# Patient Record
Sex: Male | Born: 1940
Health system: Southern US, Community
[De-identification: ages and names within clinical notes are randomized; demographics above are authoritative.]

## PROBLEM LIST (undated history)

## (undated) DIAGNOSIS — K5792 Diverticulitis of intestine, part unspecified, without perforation or abscess without bleeding: Secondary | ICD-10-CM

## (undated) DIAGNOSIS — C801 Malignant (primary) neoplasm, unspecified: Secondary | ICD-10-CM

## (undated) DIAGNOSIS — M1990 Unspecified osteoarthritis, unspecified site: Secondary | ICD-10-CM

## (undated) DIAGNOSIS — I10 Essential (primary) hypertension: Secondary | ICD-10-CM

## (undated) DIAGNOSIS — C459 Mesothelioma, unspecified: Secondary | ICD-10-CM

## (undated) DIAGNOSIS — R0600 Dyspnea, unspecified: Secondary | ICD-10-CM

## (undated) HISTORY — DX: Diverticulitis of intestine, part unspecified, without perforation or abscess without bleeding: K57.92

## (undated) HISTORY — PX: KNEE SURGERY: SHX244

## (undated) HISTORY — PX: APPENDECTOMY: SHX54

## (undated) HISTORY — PX: SPINE SURGERY: SHX786

## (undated) HISTORY — PX: PROSTATE SURGERY: SHX751

---

## 1963-02-16 HISTORY — PX: EYE SURGERY: SHX253

## 1996-02-16 HISTORY — PX: BACK SURGERY: SHX140

## 1997-07-02 ENCOUNTER — Ambulatory Visit (HOSPITAL_COMMUNITY): Admission: RE | Admit: 1997-07-02 | Discharge: 1997-07-02 | Payer: Self-pay | Admitting: Urology

## 2004-05-08 ENCOUNTER — Ambulatory Visit (HOSPITAL_COMMUNITY): Admission: RE | Admit: 2004-05-08 | Discharge: 2004-05-08 | Payer: Self-pay | Admitting: Specialist

## 2005-10-19 ENCOUNTER — Ambulatory Visit (HOSPITAL_COMMUNITY): Admission: RE | Admit: 2005-10-19 | Discharge: 2005-10-19 | Payer: Self-pay | Admitting: Specialist

## 2006-06-06 ENCOUNTER — Inpatient Hospital Stay (HOSPITAL_COMMUNITY): Admission: EM | Admit: 2006-06-06 | Discharge: 2006-06-07 | Payer: Self-pay | Admitting: Emergency Medicine

## 2006-06-07 ENCOUNTER — Encounter (INDEPENDENT_AMBULATORY_CARE_PROVIDER_SITE_OTHER): Payer: Self-pay | Admitting: Cardiology

## 2008-02-16 HISTORY — PX: JOINT REPLACEMENT: SHX530

## 2010-03-10 LAB — URINALYSIS, ROUTINE W REFLEX MICROSCOPIC
Specific Gravity, Urine: 1.017 (ref 1.005–1.030)
Urobilinogen, UA: 0.2 mg/dL (ref 0.0–1.0)

## 2010-03-10 LAB — COMPREHENSIVE METABOLIC PANEL
AST: 18 U/L (ref 0–37)
BUN: 11 mg/dL (ref 6–23)
Creatinine, Ser: 0.93 mg/dL (ref 0.4–1.5)
Glucose, Bld: 100 mg/dL — ABNORMAL HIGH (ref 70–99)
Potassium: 3.9 mEq/L (ref 3.5–5.1)
Sodium: 138 mEq/L (ref 135–145)

## 2010-03-10 LAB — CBC
Hemoglobin: 15.2 g/dL (ref 13.0–17.0)
MCH: 31.3 pg (ref 26.0–34.0)
MCV: 87.4 fL (ref 78.0–100.0)
Platelets: 210 10*3/uL (ref 150–400)
WBC: 8.4 10*3/uL (ref 4.0–10.5)

## 2010-03-10 LAB — DIFFERENTIAL
Basophils Absolute: 0.1 10*3/uL (ref 0.0–0.1)
Basophils Relative: 1 % (ref 0–1)
Eosinophils Absolute: 0.5 10*3/uL (ref 0.0–0.7)
Monocytes Absolute: 0.8 10*3/uL (ref 0.1–1.0)
Monocytes Relative: 10 % (ref 3–12)
Neutro Abs: 4.3 10*3/uL (ref 1.7–7.7)
Neutrophils Relative %: 51 % (ref 43–77)

## 2010-03-13 ENCOUNTER — Inpatient Hospital Stay (HOSPITAL_COMMUNITY)
Admission: RE | Admit: 2010-03-13 | Discharge: 2010-03-17 | Disposition: A | Discharge status: Home / Self Care | Payer: Medicare PPO | Source: Home / Self Care | Attending: Specialist | Admitting: Specialist

## 2010-03-13 LAB — ABO/RH: ABO/RH(D): O POS

## 2010-03-13 LAB — CROSSMATCH

## 2010-03-14 LAB — CBC
HCT: 38.8 % — ABNORMAL LOW (ref 39.0–52.0)
Hemoglobin: 13.4 g/dL (ref 13.0–17.0)
MCH: 30.7 pg (ref 26.0–34.0)
MCHC: 34.5 g/dL (ref 30.0–36.0)
MCV: 88.8 fL (ref 78.0–100.0)
Platelets: 200 10*3/uL (ref 150–400)
RBC: 4.37 MIL/uL (ref 4.22–5.81)
RDW: 12.2 % (ref 11.5–15.5)
WBC: 14.3 10*3/uL — ABNORMAL HIGH (ref 4.0–10.5)

## 2010-03-14 LAB — BASIC METABOLIC PANEL
GFR calc Af Amer: >60 mL/min (ref >60)
Potassium: 4.2 mEq/L (ref 3.5–5.1)
Sodium: 135 mEq/L (ref 135–145)

## 2010-03-15 LAB — CBC
HCT: 34.1 % — ABNORMAL LOW (ref 39.0–52.0)
Hemoglobin: 12.2 g/dL — ABNORMAL LOW (ref 13.0–17.0)
MCH: 31.4 pg (ref 26.0–34.0)
MCHC: 35.8 g/dL (ref 30.0–36.0)
MCV: 87.9 fL (ref 78.0–100.0)
Platelets: 190 K/uL (ref 150–400)
RBC: 3.88 MIL/uL — ABNORMAL LOW (ref 4.22–5.81)
RDW: 12.2 % (ref 11.5–15.5)
WBC: 13 K/uL — ABNORMAL HIGH (ref 4.0–10.5)

## 2010-03-15 LAB — BASIC METABOLIC PANEL WITH GFR
BUN: 10 mg/dL (ref 6–23)
CO2: 26 meq/L (ref 19–32)
Calcium: 8.3 mg/dL — ABNORMAL LOW (ref 8.4–10.5)
Chloride: 101 meq/L (ref 96–112)
Creatinine, Ser: 0.89 mg/dL (ref 0.4–1.5)
GFR calc non Af Amer: >60 mL/min
Glucose, Bld: 128 mg/dL — ABNORMAL HIGH (ref 70–99)
Potassium: 3.7 meq/L (ref 3.5–5.1)
Sodium: 133 meq/L — ABNORMAL LOW (ref 135–145)

## 2010-03-16 LAB — BASIC METABOLIC PANEL
CO2: 28 mEq/L (ref 19–32)
Calcium: 8.2 mg/dL — ABNORMAL LOW (ref 8.4–10.5)
Chloride: 99 mEq/L (ref 96–112)
Creatinine, Ser: 0.99 mg/dL (ref 0.4–1.5)
GFR calc non Af Amer: >60 mL/min (ref >60)
Glucose, Bld: 122 mg/dL — ABNORMAL HIGH (ref 70–99)
Potassium: 3.8 mEq/L (ref 3.5–5.1)
Sodium: 135 mEq/L (ref 135–145)

## 2010-03-16 LAB — CBC
Hemoglobin: 11.6 g/dL — ABNORMAL LOW (ref 13.0–17.0)
MCH: 30.9 pg (ref 26.0–34.0)
MCHC: 34.9 g/dL (ref 30.0–36.0)
RBC: 3.76 MIL/uL — ABNORMAL LOW (ref 4.22–5.81)
RDW: 12.2 % (ref 11.5–15.5)
WBC: 12.5 10*3/uL — ABNORMAL HIGH (ref 4.0–10.5)

## 2010-03-17 LAB — PROTIME-INR: INR: 1.11 (ref 0.00–1.49)

## 2010-04-15 NOTE — Discharge Summary (Signed)
Jack Haynes, Jack Haynes               ACCOUNT NO.:  [id number]  MEDICAL RECORD NO.:  [id number]          PATIENT TYPE:  INP  LOCATION:  1604                         FACILITY:  [facility]  PHYSICIAN:  Erasmo Leventhal, M.D.DATE OF BIRTH:  1940-04-03  DATE OF ADMISSION:  03/13/2010 DATE OF DISCHARGE:  03/17/2010                              DISCHARGE SUMMARY   ADMITTING DIAGNOSIS:  End-stage osteoarthritis, right knee.  DISCHARGE DIAGNOSIS:  End-stage osteoarthritis, right knee.  OPERATION:  Total knee arthroplasty, right knee.  BRIEF HISTORY:  This is a 70 year old gentleman with a history of end- stage osteoarthritis of the right knee that failed conservative treatment after discussion of treatment, benefits, risks and options. The patient is now scheduled for total knee arthroplasty of the right knee.  LABORATORY VALUES:  Admission CBC within normal exception of slightly high eosinophil of 6.  Admission PT and PTT within normal limits. Admission CMET showed glucose high at 100, otherwise normal. Hemoglobin, hematocrit reached a low of 11.6 and 33.2 on the 30th with WBC of 12.5, which was on a trend down.  He was slightly hyponatremic on the 29th at 1:33 and was back up to normal on the 30th.  He ran mildly elevated glucose throughout admission.  COURSE IN THE HOSPITAL:  The patient tolerated the procedure well. First postoperative day, his vital signs were stable.  He was afebrile. Neurovascular status was intact.  His drain was removed and he was started in therapy.  Second postoperative day, vital signs were stable. He was afebrile.  Dressing was changed.  Wound was benign.  Calves were negative and plans were made for possible discontinue on Monday.  Third postoperative day, he had significant pain and swelling in his right leg, especially the calf.  He was tender to palpation with an equivocal Homans sign.  Hemoglobin was 11.6, hematocrit 33.2, WBC 12.5.  PHYSICAL  EXAMINATION:  LUNGS:  Clear. HEART:  Sounds normal. ABDOMEN:  Bowel sounds active. EXTREMITIES:  Calf was swollen on the right, not swollen left with a equivocal Homan's.  Dressing was changed.  Wound was benign.  Dopplers were obtained, which showed no evidence of DVT.  Therefore on the fourth postoperative day, plans were made to have the patient discharged to [facility] for skilled nursing help as he lives alone and has no help at home.  CONDITION ON DISCHARGE:  Improved.  DISCHARGE MEDICATIONS: 1. Oxycodone 5 mg 1-2 q.4 h. p.r.n. pain. 2. Robaxin 500 1 p.o. q.8 h p.r.n. spasm. 3. Iron 325 mg one p.o. t.i.d. for anemia. 4. Lovenox 30 mg subcu q.12 h. for total of 10 days postop as DVT     prophylaxis.  DISCHARGE INSTRUCTIONS:  He is instructed to keep the wound clean and dry, to do his therapy daily, use CPM for 8 hours a day increasing by 5 degrees of flexion per day.  Again, keep the wound clean and dry for a total of 3 weeks postop and return to see Dr. Thomasena Edis in 2 weeks for recheck or sooner p.r.n. problems.     Jaquelyn Bitter. Chabon, P.A.   ______________________________ Erasmo Leventhal, M.D.  SJC/MEDQ  D:  03/16/2010  T:  03/16/2010  Job:  [id number]  Electronically Signed by Jodene Nam P.A. on 03/18/2010 12:43:36 PM Electronically Signed by Eugenia Mcalpine M.D. on 04/15/2010 05:19:23 PM

## 2010-04-15 NOTE — Op Note (Signed)
NAMEAUBURN, Jack Haynes               ACCOUNT NO.:  [id number]  MEDICAL RECORD NO.:  [id number]          PATIENT TYPE:  INP  LOCATION:  0007                         FACILITY:  [facility]  PHYSICIAN:  Erasmo Leventhal, M.D.DATE OF BIRTH:  May 07, 1940  DATE OF PROCEDURE:  03/13/2010 DATE OF DISCHARGE:                              OPERATIVE REPORT   PREOPERATIVE DIAGNOSIS:  Right knee end-stage osteoarthritis.  POSTOPERATIVE DIAGNOSIS:  Right knee end-stage osteoarthritis.  PROCEDURE:  Right total knee arthroplasty.  SURGEON:  Erasmo Leventhal, M.D.  ASSISTANT:  Jaquelyn Bitter. Chabon, P.A.  ANESTHESIA:  Spinal followed by monitored anesthesia care.  ESTIMATED BLOOD LOSS:  Less than 37 cc.  DRAINS:  One Hemovac.  COMPLICATIONS:  None.  TOURNIQUET TIME:  75 mmHg.  DISPOSITION:  PACU, stable.  OPERATIVE IMPLANT:  Laural Benes and [employer].  Size-4 femur, size- 4 tibia; 10-mm posterior stabilized rotating platform tibial insert, 38- mm patella, all cemented.  OPERATIVE DETAILS:  The patient was counseled in the holding area, correct site was identified.  IV was started.  Taken to the operating room where spinal anesthetic was administered, monitored anesthesia care.  Foley catheter was placed under sterile technique by OR circulating nurse.  All extremities were well padded and bumped.  Lower extremities were elevated, prepped with DuraPrep and draped in sterile fashion, exsanguinated with Esmarch, tourniquet was inflated to 300 mmHg.  Straight midline incision was made in skin and subcutaneous tissue.  Medial and lateral soft tissue flaps were developed.  Medial parapatellar arthrotomy was performed.  Proximal-medial soft tissue release was done and proximally was debrided.  Knee was flexed.  End- stage arthritis changes, bone against bone.  Cruciate ligaments were resected.  Starter hole made in the distal femur.  Canal was irrigated until the effluent was clear.  An  intramedullary rod was placed which showed a 5-degree valgus cut and a 10-mm cut off distal femur.  Distal femur was found to be a size-4, rotation markers were set, cut and set for a size-4.  A cutting block was applied.  Medial menisci removed under direct visualization.  Genicular vessels were coagulated. Posterior neurovascular structures were thawed off and protected.  Tibia subluxed anteriorly utilizing an extramedullary alignment of tibia.  We chose a 0-degrees slope and took a 2-mm cup distant defect on medial side.  Flexion and extension blocks with a 10-insert with well-balanced. Posteromedial and posterior femoral osteophytes were removed under direct visualization.  Tibia was subluxed anteriorly, found be a size-4 rotation cover set reamer punch performed and femoral box cut at this time 4 tibia, 4 femur, 10 insert well-balanced flexion extension gaps, varus-valgus stress at 0 to 36, 90 degrees of flexion.  Patella found be a size 38.  Appropriate amount of bone was resected through the large osteophyte, lock holes made for 35 button tracked anatomically.  All trials were removed.  Knee was irrigated with pulsatile lavage utilizing modern cement technique.  All components were cemented in place.  The size-4 tibia, size-4 femur, 38-patella, 10 trial.  After all cemented cured.  Excess cement was removed, 10 trial was found be excellent  balance and tracking in alignment.  Trial was removed, excess cement was removed.  Again, the knee was irrigated.  Gelfoam placed posteriorly and a final 2-mm posterior stabilizing rotating platform tibial insert was implanted and had well-aligned balanced and tracking knee.  A medium Hemovac drain was placed.  The arthrotomy was closed with 90 degrees of flexion, Vicryl subcu, skin with subcuticular Monocryl suture.  Steri- Strips applied.  Drain hooked to suction.  Sterile dressing applied. Tourniquet deflated.  Normal circulation in foot at end  of the case.  No complications or problems.  Sponge and needle count were correct.  For help with surgical technique and decision making, [name], PA assistance was needed throughout entire case.          ______________________________ Erasmo Leventhal, M.D.     RAC/MEDQ  D:  03/13/2010  T:  03/13/2010  Job:  [id number]  Electronically Signed by Eugenia Mcalpine M.D. on 04/15/2010 05:19:26 PM

## 2010-07-03 NOTE — Discharge Summary (Signed)
NAMEXYLON, CROOM NO.:  [id number]   MEDICAL RECORD NO.:  [id number]          PATIENT TYPE:  INP   LOCATION:  6523                         FACILITY:  [facility]   PHYSICIAN:  Nanetta Batty, M.D.   DATE OF BIRTH:  05-May-1940   DATE OF ADMISSION:  06/06/2006  DATE OF DISCHARGE:  06/07/2006                               DISCHARGE SUMMARY   HISTORY OF PRESENT ILLNESS:  Mr. Iddings is a 70 year old white married  male patient who was taken to the emergency room with complaints of  abdominal pain radiating up to his chest and left arm.  He was seen  originally at urgent care and was sent to [facility] ER.  He was also  apparently unable to urinate when he started having the pain.  He went  to work.  However, then he experienced weakness, diaphoresis, and  nausea, and mid-chest discomfort, and abdominal pain.  In the ER, a  Foley catheter was placed.  I do not see any reporting of any amount of  urine after the Foley was placed.  He was admitted.  A renal ultrasound  was ordered.  It was negative.  A PSA was ordered.  It was 0.1.  A  cardiac panel was negative x3.  He did have an abnormal EKG with  nonspecific T changes and a Q in III.  The following day on June 07, 2006, his Foley was discontinued.  He was able to urinate.  He had a  somewhat lower pelvic area soreness, but otherwise he was feeling fine.  A 2-D echocardiogram was done.  It was pending at the time of this  dictation.  He was seen by Dr. Allyson Sabal, and he was deemed for discharge  home.  He was sent home on an aspirin a day.   FOLLOWUP:  He will have an outpatient Cardiolite in our office this  week, and he may follow up with Dr. Tresa Endo.   OTHER LABORATORY DATA:  Sodium was 138, potassium 4.4, BUN 18,  creatinine 1.12, glucose of 153, hemoglobin 15.6, hematocrit 45.8,  platelets 231, and WBC is 14.2.   DISCHARGE DIAGNOSES:  1. Chest discomfort.  2. Dysuria.  3. History of prostatectomy secondary to  prostate cancer.      Lezlie Octave, N.P.      Nanetta Batty, M.D.  Electronically Signed    BB/MEDQ  D:  06/07/2006  T:  06/07/2006  Job:  [id number]   cc:   Chapman Fitch, MD

## 2011-11-01 ENCOUNTER — Ambulatory Visit (INDEPENDENT_AMBULATORY_CARE_PROVIDER_SITE_OTHER): Payer: Medicare PPO | Admitting: Internal Medicine

## 2011-11-01 ENCOUNTER — Encounter: Payer: Self-pay | Admitting: Internal Medicine

## 2011-11-01 VITALS — BP 145/88 | HR 68 | Temp 97.9°F | Resp 16 | Ht 68.0 in | Wt 189.0 lb

## 2011-11-01 DIAGNOSIS — Z0000 Encounter for general adult medical examination without abnormal findings: Secondary | ICD-10-CM

## 2011-11-01 DIAGNOSIS — C61 Malignant neoplasm of prostate: Secondary | ICD-10-CM | POA: Insufficient documentation

## 2011-11-01 DIAGNOSIS — H9190 Unspecified hearing loss, unspecified ear: Secondary | ICD-10-CM

## 2011-11-01 DIAGNOSIS — M159 Polyosteoarthritis, unspecified: Secondary | ICD-10-CM

## 2011-11-01 DIAGNOSIS — Z23 Encounter for immunization: Secondary | ICD-10-CM

## 2011-11-01 DIAGNOSIS — H902 Conductive hearing loss, unspecified: Secondary | ICD-10-CM

## 2011-11-01 LAB — LIPID PANEL
LDL Cholesterol: 137 mg/dL — ABNORMAL HIGH (ref 0–99)
Triglycerides: 135 mg/dL (ref <150)
VLDL: 27 mg/dL (ref 0–40)

## 2011-11-01 LAB — COMPREHENSIVE METABOLIC PANEL
BUN: 14 mg/dL (ref 6–23)
Chloride: 105 mEq/L (ref 96–112)
Creat: 0.91 mg/dL (ref 0.50–1.35)
Sodium: 139 mEq/L (ref 135–145)
Total Bilirubin: 0.6 mg/dL (ref 0.3–1.2)

## 2011-11-01 LAB — POCT UA - MICROSCOPIC ONLY
Bacteria, U Microscopic: NEGATIVE
Casts, Ur, LPF, POC: NEGATIVE
Yeast, UA: NEGATIVE

## 2011-11-01 LAB — CBC WITH DIFFERENTIAL/PLATELET
Basophils Absolute: 0 10*3/uL (ref 0.0–0.1)
Basophils Relative: 1 % (ref 0–1)
Eosinophils Absolute: 0.5 10*3/uL (ref 0.0–0.7)
HCT: 46.5 % (ref 39.0–52.0)
Lymphs Abs: 2.4 10*3/uL (ref 0.7–4.0)
MCHC: 35.5 g/dL (ref 30.0–36.0)
Monocytes Relative: 10 % (ref 3–12)
Platelets: 233 10*3/uL (ref 150–400)
RBC: 5.3 MIL/uL (ref 4.22–5.81)

## 2011-11-01 LAB — POCT URINALYSIS DIPSTICK
Blood, UA: NEGATIVE
Spec Grav, UA: >=1.03

## 2011-11-01 NOTE — Progress Notes (Signed)
  Subjective:    Patient ID: Jack Haynes, male    DOB: [birth year], 71 y.o.   MRN: [id number]  HPI Doing well. Having noticeable hearing loss. See scanned hx No spells Osteoarthritis all over, one knee replacement   Review of Systems See scanned ros    Objective:   Physical Exam  Constitutional: He is oriented to person, place, and time. He appears well-developed and well-nourished.  HENT:  Right Ear: External ear normal.  Left Ear: External ear normal.  Nose: Nose normal.  Mouth/Throat: Oropharynx is clear and moist.  Eyes: EOM are normal. Pupils are equal, round, and reactive to light.  Neck: No tracheal deviation present. No thyromegaly present.  Cardiovascular: Normal rate, regular rhythm and normal heart sounds.   Pulmonary/Chest: Effort normal and breath sounds normal.  Abdominal: Soft. Bowel sounds are normal. He exhibits no mass. There is no tenderness.  Genitourinary: Rectum normal, prostate normal and penis normal.  Musculoskeletal: He exhibits edema and tenderness.  Lymphadenopathy:    He has no cervical adenopathy.  Neurological: He is alert and oriented to person, place, and time. He has normal reflexes. No cranial nerve deficit. He exhibits normal muscle tone. Coordination normal.  Skin: Skin is warm and dry.  Psychiatric: He has a normal mood and affect. His behavior is normal. Judgment and thought content normal.   ekg nl pft nl audiometry      Assessment & Plan:  Audiogram Yearly cpe

## 2011-11-01 NOTE — Patient Instructions (Addendum)
Osteoarthritis Osteoarthritis is the most common form of arthritis. It is redness, soreness, and swelling (inflammation) affecting the cartilage. Cartilage acts as a cushion, covering the ends of bones where they meet to form a joint. CAUSES  Over time, the cartilage begins to wear away. This causes bone to rub on bone. This produces pain and stiffness in the affected joints. Factors that contribute to this problem are:  Excessive body weight.   Age.   Overuse of joints.  SYMPTOMS   People with osteoarthritis usually experience joint pain, swelling, or stiffness.   Over time, the joint may lose its normal shape.   Small deposits of bone (osteophytes) may grow on the edges of the joint.   Bits of bone or cartilage can break off and float inside the joint space. This may cause more pain and damage.   Osteoarthritis can lead to depression, anxiety, feelings of helplessness, and limitations on daily activities.  The most commonly affected joints are in the:  Ends of the fingers.   Thumbs.   Neck.   Lower back.   Knees.   Hips.  DIAGNOSIS  Diagnosis is mostly based on your symptoms and exam. Tests may be helpful, including:  X-rays of the affected joint.   A computerized magnetic scan (MRI).   Blood tests to rule out other types of arthritis.   Joint fluid tests. This involves using a needle to draw fluid from the joint and examining the fluid under a microscope.  TREATMENT  Goals of treatment are to control pain, improve joint function, maintain a normal body weight, and maintain a healthy lifestyle. Treatment approaches may include:  A prescribed exercise program with rest and joint relief.   Weight control with nutritional education.   Pain relief techniques such as:   Properly applied heat and cold.   Electric pulses delivered to nerve endings under the skin (transcutaneous electrical nerve stimulation, TENS).   Massage.   Certain supplements. Ask your  caregiver before using any supplements, especially in combination with prescribed drugs.   Medicines to control pain, such as:   Acetaminophen.   Nonsteroidal anti-inflammatory drugs (NSAIDs), such as naproxen.   Narcotic or central-acting agents, such as tramadol. This drug carries a risk of addiction and is generally prescribed for short-term use.   Corticosteroids. These can be given orally or as injection. This is a short-term treatment, not recommended for routine use.   Surgery to reposition the bones and relieve pain (osteotomy) or to remove loose pieces of bone and cartilage. Joint replacement may be needed in advanced states of osteoarthritis.  HOME CARE INSTRUCTIONS  Your caregiver can recommend specific types of exercise. These may include:  Strengthening exercises. These are done to strengthen the muscles that support joints affected by arthritis. They can be performed with weights or with exercise bands to add resistance.   Aerobic activities. These are exercises, such as brisk walking or low-impact aerobics, that get your heart pumping. They can help keep your lungs and circulatory system in shape.   Range-of-motion activities. These keep your joints limber.   Balance and agility exercises. These help you maintain daily living skills.  Learning about your condition and being actively involved in your care will help improve the course of your osteoarthritis. SEEK MEDICAL CARE IF:   You feel hot or your skin turns red.   You develop a rash in addition to your joint pain.   You have an oral temperature above 102 F (38.9 C).  FOR   MORE INFORMATION  National Institute of Arthritis and Musculoskeletal and Skin Diseases: www.niams.nih.gov National Institute on Aging: www.nia.nih.gov American College of Rheumatology: www.rheumatology.org Document Released: 02/01/2005 Document Revised: 01/21/2011 Document Reviewed: 05/15/2009 Exit[employer] Patient Information 2012 [employer]  [employer]. 

## 2011-11-02 ENCOUNTER — Encounter: Payer: Self-pay | Admitting: Internal Medicine

## 2011-11-02 LAB — PULMONARY FUNCTION TEST

## 2011-11-29 ENCOUNTER — Encounter: Payer: Self-pay | Admitting: Internal Medicine

## 2011-12-26 ENCOUNTER — Emergency Department (HOSPITAL_COMMUNITY)
Admission: EM | Admit: 2011-12-26 | Discharge: 2011-12-26 | Disposition: A | Discharge status: Home / Self Care | Payer: Medicare PPO | Attending: Emergency Medicine | Admitting: Emergency Medicine

## 2011-12-26 ENCOUNTER — Encounter (HOSPITAL_COMMUNITY): Payer: Self-pay | Admitting: Emergency Medicine

## 2011-12-26 ENCOUNTER — Emergency Department (HOSPITAL_COMMUNITY): Payer: Medicare PPO

## 2011-12-26 DIAGNOSIS — Z79899 Other long term (current) drug therapy: Secondary | ICD-10-CM | POA: Insufficient documentation

## 2011-12-26 DIAGNOSIS — M25562 Pain in left knee: Secondary | ICD-10-CM

## 2011-12-26 DIAGNOSIS — M25462 Effusion, left knee: Secondary | ICD-10-CM

## 2011-12-26 DIAGNOSIS — M25469 Effusion, unspecified knee: Secondary | ICD-10-CM | POA: Insufficient documentation

## 2011-12-26 MED ORDER — MELOXICAM 15 MG PO TABS: 15.0000 mg | ORAL_TABLET | Freq: Every day | ORAL | Status: DC

## 2011-12-26 MED ORDER — BENZOCAINE (TOPICAL) 20 % EX AERO: INHALATION_SPRAY | Freq: Four times a day (QID) | CUTANEOUS | Status: DC | PRN

## 2011-12-26 MED ORDER — TRIAMCINOLONE ACETONIDE 40 MG/ML IJ SUSP
40.0000 mg | Freq: Once | INTRAMUSCULAR | Status: AC
  Administered 2011-12-26: 40 mg via INTRAMUSCULAR
  Filled 2011-12-26: qty 1

## 2011-12-26 NOTE — ED Notes (Signed)
Pt states he bent over yesterday to clean up leaves and heard cracking/pop sound from L knee.

## 2011-12-26 NOTE — ED Provider Notes (Signed)
History     CSN: [id number]  Arrival date & time 12/26/11  4098   First MD Initiated Contact with Patient 12/26/11 1009      Chief Complaint  Patient presents with  . Knee Pain    (Consider location/radiation/quality/duration/timing/severity/associated sxs/prior treatment) HPI Comments: Jack Haynes 71 y.o. male   The chief complaint is: Patient presents with:   Knee Pain    CC of left knee pain and swelling. History of OA left knee and plan for future plans for LTK replacement. Followed by Dr. Valma Cava at [facility] ortho. Yesterday sttood from squating postiion and heard la loud pop.  Immediate swelling and pain. Unable to bear weight. Previous injury to knee of patellar dislocation approx.  50 years ago.  Multiple surgeries for cartilage repair and two arthroscopies.  Deneis twisting injury. Denies feeling of instability.  ROM decreased due to pain and sweilling. Denies numbness tingling in the foot. Patient states that knee aspiration has helped when he has had this before.   Denies fevers, chills, myalgias, arthralgias. Denies DOE, SOB, chest tightness or pressure, radiation to left arm, jaw or back, or diaphoresis. Denies dysuria, flank pain, suprapubic pain, frequency, urgency, or hematuria. Denies headaches, light headedness, weakness, visual disturbances. Denies abdominal pain, nausea, vomiting, diarrhea or constipation. .    Patient is a 71 y.o. male presenting with knee pain. The history is provided by the patient. No language interpreter was used.  Knee Pain    History reviewed. No pertinent past medical history.  Past Surgical History  Procedure Date  . Knee surgery     No family history on file.  History  Substance Use Topics  . Smoking status: Never Smoker   . Smokeless tobacco: Current User  . Alcohol Use: Yes      Review of Systems  All other systems reviewed and are negative.   Review of Systems  All other systems reviewed and are  negative.    Review of Systems  Constitutional: Negative.  Negative for fever and chills.  HENT: Negative.   Eyes: Negative.   Respiratory: Negative.  Negative for cough and shortness of breath.   Cardiovascular: Negative.  Negative for chest pain and palpitations.  Gastrointestinal: Negative.  Negative for vomiting, abdominal pain, diarrhea and constipation.  Genitourinary: Negative.  Negative for dysuria, urgency and frequency.  Musculoskeletal: Positive for Left knee welling and pain.  Decreased ROM. Inability to bear weight. Skin: Negative for rash and wound.  Neurological: Negative.  Negative for headaches.  Psychiatric/Behavioral: Negative.   All other systems reviewed and are negative.    Allergies  Review of patient's allergies indicates no known allergies.  Home Medications   Current Outpatient Rx  Name  Route  Sig  Dispense  Refill  . NAPROXEN SODIUM 220 MG PO TABS   Oral   Take 220 mg by mouth 2 (two) times a week.           BP 165/88  Pulse 86  Temp 97.1 F (36.2 C) (Oral)  Resp 22  SpO2 96%  Physical Exam Physical Exam  Nursing note and vitals reviewed. Constitutional: He appears well-developed and well-nourished. No distress.  HENT:  Head: Normocephalic and atraumatic.  Eyes: Conjunctivae normal are normal. No scleral icterus.  Neck: Normal range of motion. Neck supple.  Cardiovascular: Normal rate, regular rhythm and normal heart sounds.   Pulmonary/Chest: Effort normal and breath sounds normal. No respiratory distress.  Abdominal: Soft. There is no tenderness.  Musculoskeletal: A  left knee exam was performed. SKIN: intact SWELLING: significant EFFUSION: tense WARMTH: no warmth TENDERNESS: diffuse and severe ROM: limited by pain NEUROLOGICAL EXAM: normal VASCULAR EXAM: normal  Neurological: He is alert.  Skin: Skin is warm and dry. He is not diaphoretic.  Psychiatric: His behavior is normal.     ED Course  Procedures (including  critical care time)  Labs Reviewed - No data to display Dg Knee Complete 4 Views Left  12/26/2011  *RADIOLOGY REPORT*  Clinical Data: The patient felt a tearing sensation this morning when standing up.  LEFT KNEE - COMPLETE 4+ VIEW  Comparison: None.  Findings: There is significant tried compartmental degenerative change, with near complete obliteration of the lateral joint compartment.  Moderate joint effusion is present.  There is no evidence for acute fracture or dislocation.  IMPRESSION:  1.  Significant degenerative change. 2.  Moderate joint effusion.   Original Report Authenticated By: Norva Pavlov, M.D.      1. Knee pain, left   2. Effusion of left knee       MDM  Patient with Knee effusion and swelling . Patella feel displaced.  Awaiting xray.  Patien has RX hydrocodone at home for his regular OA.  He rarely uses it and takes 1 aleve twice a week for OA.  10:45 AM BP 165/88  Pulse 86  Temp 97.1 F (36.2 C) (Oral)  Resp 22  SpO2 96% Patient with Knee effusion, OA and no signs of acute fracture or dislocation.   .Patient seen in shared visit with Thayer Ohm lawyer PA-C who Perormed Joint aspiration. Please see his note.  WIll apply knee immoblizer. Patient has crutches.  No signs of infection.  Will d/c with mobic discontinue aleve.  vicodin as needed.  F/U Dr. Thomasena Edis Discussed reasons to seek immediate care. Patient expresses understanding and agrees with plan.        Arthor Captain, PA-C 12/26/11 1158

## 2011-12-26 NOTE — ED Provider Notes (Signed)
Medical screening examination/treatment/procedure(s) were performed by non-physician practitioner and as supervising physician I was immediately available for consultation/collaboration.   Kianni Lheureux, MD 12/26/11 1601 

## 2011-12-30 ENCOUNTER — Telehealth: Payer: Self-pay

## 2011-12-30 NOTE — Telephone Encounter (Signed)
Pt would like [place] to call him at [phone] when form is complete.

## 2011-12-30 NOTE — Telephone Encounter (Signed)
Pt dropped off a form for surgical clearance for [facility]. I have put it in Dr Ernestene Mention box for completion if appropriate.

## 2012-01-10 ENCOUNTER — Other Ambulatory Visit: Payer: Self-pay | Admitting: Pain Medicine

## 2012-01-26 ENCOUNTER — Encounter (HOSPITAL_COMMUNITY): Payer: Self-pay | Admitting: Pharmacy Technician

## 2012-01-26 ENCOUNTER — Other Ambulatory Visit: Payer: Self-pay | Admitting: Pain Medicine

## 2012-01-26 NOTE — Patient Instructions (Signed)
20 Jack Haynes  01/26/2012   Your procedure is scheduled on: 02/01/12    Report to [facility] at 1230 AM.  Call this number if you have problems the morning of surgery: [phone]   Remember:   Do not eat food:After Midnight.  May have clear liquids:until 0830am then npo .  Clear liquids include soda, tea, black coffee, apple or grape juice, broth.  Take these medicines the morning of surgery with A SIP OF WATER:    Do not wear jewelry,   Do not wear lotions, powders, or perfumes..  . Men may shave face and neck.  Do not bring valuables to the hospital.  Contacts, dentures or bridgework may not be worn into surgery.  Leave suitcase in the car. After surgery it may be brought to your room.  For patients admitted to the hospital, checkout time is 11:00 AM the day of discharge.              SEE CHG INSTRUCTION SHEET    Please read over the following fact sheets that you were given: MRSA Information, coughing and deep breathing exercises, leg exercises, Blood Transfusion Fact Sheet, Incentive Spirometry Fact Sheet               Failure to comply with these instructions may result in cancellation of your surgery.               Patient Signature _______________________________________________________              Nurse Signature _______________________________________________________

## 2012-01-26 NOTE — H&P (Signed)
TOTAL KNEE ADMISSION H&P  Patient is being admitted for left total knee arthroplasty.  Subjective:  Chief Complaint:left knee pain.  HPI: Jack Haynes, 71 y.o. male, has a history of pain and functional disability in the left knee due to arthritis and has failed non-surgical conservative treatments for greater than 12 weeks to includeNSAID's and/or analgesics, corticosteriod injections, flexibility and strengthening excercises, supervised PT with diminished ADL's post treatment and activity modification.  Onset of symptoms was abrupt, starting 8 years ago with rapidlly worsening course since that time. The patient noted prior procedures on the knee to include  arthroscopy on the left knee(s).  Patient currently rates pain in the left knee(s) at 8 out of 10 with activity. Patient has night pain, worsening of pain with activity and weight bearing, pain that interferes with activities of daily living, pain with passive range of motion and crepitus.  Patient has evidence of subchondral cysts, sclerotic changes,osteophytes,bone on bone lateral joint by imaging studies. This patient has had arthroscopy. There is no active infection.  Patient Active Problem List   Diagnosis Date Noted  . Prostate cancer 11/01/2011   No past medical history on file.  Past Surgical History  Procedure Date  . Knee surgery      (Not in a hospital admission) No Known Allergies  History  Substance Use Topics  . Smoking status: Never Smoker   . Smokeless tobacco: Current User  . Alcohol Use: Yes    No family history on file.   Review of Systems  All other systems reviewed and are negative.    Objective:  Physical Exam Cons alert well developed,amb with limp,head/neck WNL, no lymphadenopathy,lungs clear, heart RRR, Abd soft NT +BS, Up extrem WNL, Right leg good ROM hip,knee with midline incision good ROM,leg NMVI, Left leg good ROM hip,knee valgus deform, 5-110 ROM, leg NMVI, Breast,rectal  Defurred.  Vital signs in last 24 hours: @VSRANGES @  Labs:   Estimated Body mass index is 28.74 kg/(m^2) as calculated from the following:   Height as of 11/01/11: 5\' 8" (1.727 m).   Weight as of 11/01/11: 189 lb(85.73 kg).   Imaging Review Plain radiographs demonstrate severe degenerative joint disease of the left knee(s). The overall alignment ismild valgus. The bone quality appears to be good for age and reported activity level.  Assessment/Plan:  End stage arthritis, left knee   The patient history, physical examination, clinical judgment of the provider and imaging studies are consistent with end stage degenerative joint disease of the left knee(s) and total knee arthroplasty is deemed medically necessary. The treatment options including medical management, injection therapy arthroscopy and arthroplasty were discussed at length. The risks and benefits of total knee arthroplasty were presented and reviewed. The risks due to aseptic loosening, infection, stiffness, patella tracking problems, thromboembolic complications and other imponderables were discussed. The patient acknowledged the explanation, agreed to proceed with the plan and consent was signed. Patient is being admitted for inpatient treatment for surgery, pain control, PT, OT, prophylactic antibiotics, VTE prophylaxis, progressive ambulation and ADL's and discharge planning. The patient is planning to be discharged home with home health services

## 2012-01-27 ENCOUNTER — Encounter (HOSPITAL_COMMUNITY)
Admission: RE | Admit: 2012-01-27 | Discharge: 2012-01-27 | Disposition: A | Discharge status: Home / Self Care | Payer: Medicare PPO | Source: Ambulatory Visit | Attending: Specialist | Admitting: Specialist

## 2012-01-27 ENCOUNTER — Encounter (HOSPITAL_COMMUNITY): Payer: Self-pay

## 2012-01-27 HISTORY — DX: Unspecified osteoarthritis, unspecified site: M19.90

## 2012-01-27 HISTORY — DX: Malignant (primary) neoplasm, unspecified: C80.1

## 2012-01-27 LAB — CBC WITH DIFFERENTIAL/PLATELET
Eosinophils Relative: 5 % (ref 0–5)
HCT: 44.1 % (ref 39.0–52.0)
Lymphocytes Relative: 29 % (ref 12–46)
Lymphs Abs: 2.1 10*3/uL (ref 0.7–4.0)
MCV: 86.5 fL (ref 78.0–100.0)
Monocytes Absolute: 0.8 10*3/uL (ref 0.1–1.0)
RBC: 5.1 MIL/uL (ref 4.22–5.81)
WBC: 7.3 10*3/uL (ref 4.0–10.5)

## 2012-01-27 LAB — URINALYSIS, ROUTINE W REFLEX MICROSCOPIC
Glucose, UA: NEGATIVE mg/dL
Ketones, ur: NEGATIVE mg/dL
Leukocytes, UA: NEGATIVE
Nitrite: NEGATIVE
Protein, ur: NEGATIVE mg/dL

## 2012-01-27 LAB — PROTIME-INR: INR: 0.97 (ref 0.00–1.49)

## 2012-01-27 LAB — COMPREHENSIVE METABOLIC PANEL
CO2: 24 mEq/L (ref 19–32)
Calcium: 9 mg/dL (ref 8.4–10.5)
Creatinine, Ser: 0.81 mg/dL (ref 0.50–1.35)
GFR calc Af Amer: >90 mL/min (ref >90)
GFR calc non Af Amer: 87 mL/min — ABNORMAL LOW (ref >90)
Glucose, Bld: 89 mg/dL (ref 70–99)

## 2012-01-31 MED ORDER — CEFAZOLIN SODIUM-DEXTROSE 2-3 GM-% IV SOLR
2.0000 g | INTRAVENOUS | Status: AC
  Administered 2012-02-01: 2 g via INTRAVENOUS

## 2012-02-01 ENCOUNTER — Inpatient Hospital Stay (HOSPITAL_COMMUNITY): Payer: Medicare PPO | Admitting: Anesthesiology

## 2012-02-01 ENCOUNTER — Encounter (HOSPITAL_COMMUNITY)
Admission: RE | Disposition: A | Discharge status: Home Health | Payer: Self-pay | Source: Ambulatory Visit | Attending: Specialist

## 2012-02-01 ENCOUNTER — Encounter (HOSPITAL_COMMUNITY): Payer: Self-pay | Admitting: Anesthesiology

## 2012-02-01 ENCOUNTER — Encounter (HOSPITAL_COMMUNITY): Payer: Self-pay | Admitting: *Deleted

## 2012-02-01 ENCOUNTER — Inpatient Hospital Stay (HOSPITAL_COMMUNITY)
Admission: RE | Admit: 2012-02-01 | Discharge: 2012-02-04 | DRG: 470 | Disposition: A | Discharge status: Home Health | Payer: Medicare PPO | Source: Ambulatory Visit | Attending: Specialist | Admitting: Specialist

## 2012-02-01 DIAGNOSIS — Z01812 Encounter for preprocedural laboratory examination: Secondary | ICD-10-CM

## 2012-02-01 DIAGNOSIS — D62 Acute posthemorrhagic anemia: Secondary | ICD-10-CM | POA: Diagnosis not present

## 2012-02-01 DIAGNOSIS — Z96659 Presence of unspecified artificial knee joint: Secondary | ICD-10-CM

## 2012-02-01 DIAGNOSIS — M1710 Unilateral primary osteoarthritis, unspecified knee: Principal | ICD-10-CM | POA: Diagnosis present

## 2012-02-01 DIAGNOSIS — I10 Essential (primary) hypertension: Secondary | ICD-10-CM | POA: Diagnosis present

## 2012-02-01 HISTORY — PX: TOTAL KNEE ARTHROPLASTY: SHX125

## 2012-02-01 LAB — TYPE AND SCREEN: Antibody Screen: NEGATIVE

## 2012-02-01 SURGERY — ARTHROPLASTY, KNEE, TOTAL
Anesthesia: Spinal | Site: Knee | Laterality: Left | Wound class: Clean

## 2012-02-01 MED ORDER — PHENOL 1.4 % MT LIQD
1.0000 | OROMUCOSAL | Status: DC | PRN
  Filled 2012-02-01: qty 177

## 2012-02-01 MED ORDER — BUPIVACAINE IN DEXTROSE 0.75-8.25 % IT SOLN
INTRATHECAL | Status: DC | PRN
  Administered 2012-02-01: 1.8 mL via INTRATHECAL

## 2012-02-01 MED ORDER — ALUM & MAG HYDROXIDE-SIMETH 200-200-20 MG/5ML PO SUSP: 30.0000 mL | ORAL | Status: DC | PRN

## 2012-02-01 MED ORDER — CEFAZOLIN SODIUM-DEXTROSE 2-3 GM-% IV SOLR
2.0000 g | Freq: Four times a day (QID) | INTRAVENOUS | Status: AC
  Administered 2012-02-01 – 2012-02-02 (×2): 2 g via INTRAVENOUS
  Filled 2012-02-01 (×2): qty 50

## 2012-02-01 MED ORDER — ONDANSETRON HCL 4 MG/2ML IJ SOLN: 4.0000 mg | Freq: Four times a day (QID) | INTRAMUSCULAR | Status: DC | PRN

## 2012-02-01 MED ORDER — ONDANSETRON HCL 4 MG PO TABS: 4.0000 mg | ORAL_TABLET | Freq: Four times a day (QID) | ORAL | Status: DC | PRN

## 2012-02-01 MED ORDER — SODIUM CHLORIDE 0.9 % IV SOLN: INTRAVENOUS | Status: DC

## 2012-02-01 MED ORDER — KETOROLAC TROMETHAMINE 30 MG/ML IJ SOLN
INTRAMUSCULAR | Status: DC | PRN
  Administered 2012-02-01: 30 mg via INTRAMUSCULAR

## 2012-02-01 MED ORDER — LACTATED RINGERS IV SOLN
INTRAVENOUS | Status: DC
  Administered 2012-02-01: 1000 mL via INTRAVENOUS
  Administered 2012-02-01 (×2): via INTRAVENOUS

## 2012-02-01 MED ORDER — OXYCODONE HCL 5 MG/5ML PO SOLN
5.0000 mg | Freq: Once | ORAL | Status: DC | PRN
  Filled 2012-02-01: qty 5

## 2012-02-01 MED ORDER — FLEET ENEMA 7-19 GM/118ML RE ENEM: 1.0000 | ENEMA | Freq: Once | RECTAL | Status: AC | PRN

## 2012-02-01 MED ORDER — HYDROMORPHONE HCL PF 1 MG/ML IJ SOLN: 0.2500 mg | INTRAMUSCULAR | Status: DC | PRN

## 2012-02-01 MED ORDER — PROPOFOL 10 MG/ML IV BOLUS
INTRAVENOUS | Status: DC | PRN
  Administered 2012-02-01 (×2): 10 mg via INTRAVENOUS

## 2012-02-01 MED ORDER — BUPIVACAINE-EPINEPHRINE PF 0.25-1:200000 % IJ SOLN
INTRAMUSCULAR | Status: DC | PRN
  Administered 2012-02-01: 50 mL

## 2012-02-01 MED ORDER — PROPOFOL 10 MG/ML IV EMUL
INTRAVENOUS | Status: DC | PRN
  Administered 2012-02-01: 100 ug/kg/min via INTRAVENOUS

## 2012-02-01 MED ORDER — ACETAMINOPHEN 10 MG/ML IV SOLN: 1000.0000 mg | Freq: Once | INTRAVENOUS | Status: DC | PRN

## 2012-02-01 MED ORDER — POTASSIUM CHLORIDE IN NACL 20-0.9 MEQ/L-% IV SOLN
INTRAVENOUS | Status: DC
  Administered 2012-02-01 – 2012-02-02 (×2): via INTRAVENOUS
  Filled 2012-02-01 (×3): qty 1000

## 2012-02-01 MED ORDER — POVIDONE-IODINE 7.5 % EX SOLN: Freq: Once | CUTANEOUS | Status: DC

## 2012-02-01 MED ORDER — DOCUSATE SODIUM 100 MG PO CAPS
100.0000 mg | ORAL_CAPSULE | Freq: Two times a day (BID) | ORAL | Status: DC
  Administered 2012-02-01 – 2012-02-04 (×6): 100 mg via ORAL

## 2012-02-01 MED ORDER — DIPHENHYDRAMINE HCL 12.5 MG/5ML PO ELIX: 12.5000 mg | ORAL_SOLUTION | ORAL | Status: DC | PRN

## 2012-02-01 MED ORDER — METHOCARBAMOL 500 MG PO TABS
500.0000 mg | ORAL_TABLET | Freq: Four times a day (QID) | ORAL | Status: DC | PRN
  Administered 2012-02-02 – 2012-02-04 (×5): 500 mg via ORAL
  Filled 2012-02-01 (×5): qty 1

## 2012-02-01 MED ORDER — PHENYLEPHRINE HCL 10 MG/ML IJ SOLN
INTRAMUSCULAR | Status: DC | PRN
  Administered 2012-02-01 (×5): 40 ug via INTRAVENOUS

## 2012-02-01 MED ORDER — MIDAZOLAM HCL 5 MG/5ML IJ SOLN
INTRAMUSCULAR | Status: DC | PRN
  Administered 2012-02-01: 2 mg via INTRAVENOUS

## 2012-02-01 MED ORDER — MENTHOL 3 MG MT LOZG
1.0000 | LOZENGE | OROMUCOSAL | Status: DC | PRN
  Filled 2012-02-01: qty 9

## 2012-02-01 MED ORDER — HYDROMORPHONE HCL PF 1 MG/ML IJ SOLN
0.2500 mg | INTRAMUSCULAR | Status: DC | PRN
  Administered 2012-02-01: 0.5 mg via INTRAVENOUS
  Filled 2012-02-01: qty 1

## 2012-02-01 MED ORDER — OXYCODONE HCL 5 MG PO TABS
5.0000 mg | ORAL_TABLET | ORAL | Status: DC | PRN
  Administered 2012-02-01 – 2012-02-03 (×11): 10 mg via ORAL
  Administered 2012-02-03 – 2012-02-04 (×4): 5 mg via ORAL
  Filled 2012-02-01: qty 2
  Filled 2012-02-01: qty 1
  Filled 2012-02-01 (×2): qty 2
  Filled 2012-02-01 (×2): qty 1
  Filled 2012-02-01: qty 2
  Filled 2012-02-01: qty 1
  Filled 2012-02-01 (×8): qty 2

## 2012-02-01 MED ORDER — PROMETHAZINE HCL 25 MG/ML IJ SOLN: 6.2500 mg | INTRAMUSCULAR | Status: DC | PRN

## 2012-02-01 MED ORDER — ACETAMINOPHEN 650 MG RE SUPP: 650.0000 mg | Freq: Four times a day (QID) | RECTAL | Status: DC | PRN

## 2012-02-01 MED ORDER — ACETAMINOPHEN 10 MG/ML IV SOLN
1000.0000 mg | Freq: Four times a day (QID) | INTRAVENOUS | Status: AC
  Administered 2012-02-01 – 2012-02-02 (×4): 1000 mg via INTRAVENOUS
  Filled 2012-02-01 (×7): qty 100

## 2012-02-01 MED ORDER — FENTANYL CITRATE 0.05 MG/ML IJ SOLN
INTRAMUSCULAR | Status: DC | PRN
  Administered 2012-02-01: 100 ug via INTRAVENOUS

## 2012-02-01 MED ORDER — ACETAMINOPHEN 325 MG PO TABS: 650.0000 mg | ORAL_TABLET | Freq: Four times a day (QID) | ORAL | Status: DC | PRN

## 2012-02-01 MED ORDER — FERROUS SULFATE 325 (65 FE) MG PO TABS
325.0000 mg | ORAL_TABLET | Freq: Three times a day (TID) | ORAL | Status: DC
  Administered 2012-02-01 – 2012-02-04 (×8): 325 mg via ORAL
  Filled 2012-02-01 (×11): qty 1

## 2012-02-01 MED ORDER — ENOXAPARIN SODIUM 30 MG/0.3ML ~~LOC~~ SOLN
30.0000 mg | Freq: Two times a day (BID) | SUBCUTANEOUS | Status: DC
  Administered 2012-02-02 – 2012-02-04 (×5): 30 mg via SUBCUTANEOUS
  Filled 2012-02-01 (×7): qty 0.3

## 2012-02-01 MED ORDER — POLYETHYLENE GLYCOL 3350 17 G PO PACK: 17.0000 g | PACK | Freq: Every day | ORAL | Status: DC | PRN

## 2012-02-01 MED ORDER — ZOLPIDEM TARTRATE 5 MG PO TABS: 5.0000 mg | ORAL_TABLET | Freq: Every evening | ORAL | Status: DC | PRN

## 2012-02-01 MED ORDER — ACETAMINOPHEN 10 MG/ML IV SOLN
INTRAVENOUS | Status: DC | PRN
  Administered 2012-02-01: 1000 mg via INTRAVENOUS

## 2012-02-01 MED ORDER — METHOCARBAMOL 100 MG/ML IJ SOLN
500.0000 mg | Freq: Four times a day (QID) | INTRAVENOUS | Status: DC | PRN
  Administered 2012-02-01: 500 mg via INTRAVENOUS
  Filled 2012-02-01: qty 5

## 2012-02-01 MED ORDER — MEPERIDINE HCL 50 MG/ML IJ SOLN: 6.2500 mg | INTRAMUSCULAR | Status: DC | PRN

## 2012-02-01 MED ORDER — OXYCODONE HCL 5 MG PO TABS: 5.0000 mg | ORAL_TABLET | Freq: Once | ORAL | Status: DC | PRN

## 2012-02-01 MED ORDER — BISACODYL 10 MG RE SUPP: 10.0000 mg | Freq: Every day | RECTAL | Status: DC | PRN

## 2012-02-01 MED ORDER — SODIUM CHLORIDE 0.9 % IR SOLN
Status: DC | PRN
  Administered 2012-02-01: 3000 mL

## 2012-02-01 SURGICAL SUPPLY — 63 items
BAG ZIPLOCK 12X15 (MISCELLANEOUS) ×4 IMPLANT
BANDAGE ELASTIC 4 VELCRO ST LF (GAUZE/BANDAGES/DRESSINGS) ×2 IMPLANT
BANDAGE ELASTIC 6 VELCRO ST LF (GAUZE/BANDAGES/DRESSINGS) ×2 IMPLANT
BANDAGE ESMARK 6X9 LF (GAUZE/BANDAGES/DRESSINGS) ×1 IMPLANT
BANDAGE GAUZE ELAST BULKY 4 IN (GAUZE/BANDAGES/DRESSINGS) ×2 IMPLANT
BLADE SAG 18X100X1.27 (BLADE) ×2 IMPLANT
BLADE SAW SGTL 13.0X1.19X90.0M (BLADE) ×2 IMPLANT
BNDG ESMARK 6X9 LF (GAUZE/BANDAGES/DRESSINGS) ×2
CEMENT HV SMART SET (Cement) ×4 IMPLANT
CLOTH BEACON ORANGE TIMEOUT ST (SAFETY) ×2 IMPLANT
CLSR STERI-STRIP ANTIMIC 1/2X4 (GAUZE/BANDAGES/DRESSINGS) ×2 IMPLANT
CUFF TOURN SGL QUICK 34 (TOURNIQUET CUFF) ×1
CUFF TRNQT CYL 34X4X40X1 (TOURNIQUET CUFF) ×1 IMPLANT
DRAPE EXTREMITY T 121X128X90 (DRAPE) ×2 IMPLANT
DRAPE LG THREE QUARTER DISP (DRAPES) ×2 IMPLANT
DRAPE POUCH INSTRU U-SHP 10X18 (DRAPES) ×2 IMPLANT
DRAPE U-SHAPE 47X51 STRL (DRAPES) ×2 IMPLANT
DRSG PAD ABDOMINAL 8X10 ST (GAUZE/BANDAGES/DRESSINGS) ×4 IMPLANT
DURAPREP 26ML APPLICATOR (WOUND CARE) ×4 IMPLANT
ELECT REM PT RETURN 9FT ADLT (ELECTROSURGICAL) ×2
ELECTRODE REM PT RTRN 9FT ADLT (ELECTROSURGICAL) ×1 IMPLANT
EVACUATOR 1/8 PVC DRAIN (DRAIN) ×2 IMPLANT
FACESHIELD LNG OPTICON STERILE (SAFETY) ×10 IMPLANT
GAUZE XEROFORM 2X2 STRL (GAUZE/BANDAGES/DRESSINGS) ×2 IMPLANT
GLOVE ECLIPSE 8.0 STRL XLNG CF (GLOVE) ×2 IMPLANT
GLOVE SURG ORTHO 8.0 STRL STRW (GLOVE) ×2 IMPLANT
GLOVE SURG ORTHO 9.0 STRL STRW (GLOVE) ×2 IMPLANT
GOWN PREVENTION PLUS XLARGE (GOWN DISPOSABLE) ×6 IMPLANT
GOWN STRL NON-REIN LRG LVL3 (GOWN DISPOSABLE) ×2 IMPLANT
GOWN STRL REIN XL XLG (GOWN DISPOSABLE) ×2 IMPLANT
HANDPIECE INTERPULSE COAX TIP (DISPOSABLE) ×1
IMMOBILIZER KNEE 20 (SOFTGOODS)
IMMOBILIZER KNEE 20 THIGH 36 (SOFTGOODS) IMPLANT
KIT BASIN OR (CUSTOM PROCEDURE TRAY) ×2 IMPLANT
NDL SAFETY ECLIPSE 18X1.5 (NEEDLE) ×1 IMPLANT
NEEDLE HYPO 18GX1.5 SHARP (NEEDLE) ×1
NS IRRIG 1000ML POUR BTL (IV SOLUTION) ×2 IMPLANT
PACK TOTAL JOINT (CUSTOM PROCEDURE TRAY) ×2 IMPLANT
PAD ABD 7.5X8 STRL (GAUZE/BANDAGES/DRESSINGS) ×2 IMPLANT
POSITIONER SURGICAL ARM (MISCELLANEOUS) ×2 IMPLANT
SET HNDPC FAN SPRY TIP SCT (DISPOSABLE) ×1 IMPLANT
SET PAD KNEE POSITIONER (MISCELLANEOUS) ×2 IMPLANT
SPONGE GAUZE 4X4 12PLY (GAUZE/BANDAGES/DRESSINGS) ×2 IMPLANT
SPONGE LAP 18X18 X RAY DECT (DISPOSABLE) ×2 IMPLANT
SPONGE SURGIFOAM ABS GEL 100 (HEMOSTASIS) ×2 IMPLANT
STOCKINETTE 6  STRL (DRAPES) ×1
STOCKINETTE 6 STRL (DRAPES) ×1 IMPLANT
STRIP CLOSURE SKIN 1/2X4 (GAUZE/BANDAGES/DRESSINGS) ×4 IMPLANT
SUCTION FRAZIER 12FR DISP (SUCTIONS) ×2 IMPLANT
SUT BONE WAX W31G (SUTURE) ×2 IMPLANT
SUT MNCRL AB 3-0 PS2 18 (SUTURE) ×2 IMPLANT
SUT VIC AB 1 CT1 27 (SUTURE) ×7
SUT VIC AB 1 CT1 27XBRD ANTBC (SUTURE) ×7 IMPLANT
SUT VIC AB 2-0 CT1 27 (SUTURE) ×2
SUT VIC AB 2-0 CT1 TAPERPNT 27 (SUTURE) ×2 IMPLANT
SUT VLOC 180 0 24IN GS25 (SUTURE) ×2 IMPLANT
SYR 50ML LL SCALE MARK (SYRINGE) ×2 IMPLANT
TAPE STRIPS DRAPE STRL (GAUZE/BANDAGES/DRESSINGS) ×2 IMPLANT
TOWEL OR 17X26 10 PK STRL BLUE (TOWEL DISPOSABLE) ×6 IMPLANT
TOWER CARTRIDGE SMART MIX (DISPOSABLE) ×2 IMPLANT
TRAY FOLEY CATH 14FRSI W/METER (CATHETERS) ×2 IMPLANT
WATER STERILE IRR 1500ML POUR (IV SOLUTION) ×2 IMPLANT
WRAP KNEE MAXI GEL POST OP (GAUZE/BANDAGES/DRESSINGS) ×2 IMPLANT

## 2012-02-01 NOTE — Anesthesia Postprocedure Evaluation (Signed)
  Anesthesia Post-op Note  Patient: Jack Haynes  Procedure(s) Performed: Procedure(s) (LRB): TOTAL KNEE ARTHROPLASTY (Left)  Patient Location: PACU  Anesthesia Type: Spinal  Level of Consciousness: awake and alert   Airway and Oxygen Therapy: Patient Spontanous Breathing  Post-op Pain: mild  Post-op Assessment: Post-op Vital signs reviewed, Patient's Cardiovascular Status Stable, Respiratory Function Stable, Patent Airway and No signs of Nausea or vomiting  Last Vitals:  Filed Vitals:   02/01/12 1830  BP: 130/73  Pulse: 73  Temp: 36.8 C  Resp: 19    Post-op Vital Signs: stable   Complications: No apparent anesthesia complications

## 2012-02-01 NOTE — Anesthesia Preprocedure Evaluation (Addendum)
Anesthesia Evaluation  Patient identified by MRN, date of birth, ID band Patient awake    Reviewed: Allergy & Precautions, H&P , NPO status , Patient's Chart, lab work & pertinent test results  Airway Mallampati: II TM Distance: >3 FB Neck ROM: Full    Dental  (+) Dental Advisory Given and Teeth Intact   Pulmonary neg pulmonary ROS,  breath sounds clear to auscultation  Pulmonary exam normal       Cardiovascular hypertension, - Past MI and - CHF Rhythm:Regular Rate:Normal     Neuro/Psych negative neurological ROS  negative psych ROS   GI/Hepatic negative GI ROS, Neg liver ROS,   Endo/Other  negative endocrine ROS  Renal/GU negative Renal ROS     Musculoskeletal negative musculoskeletal ROS (+)   Abdominal   Peds  Hematology  (+) Blood dyscrasia, anemia ,   Anesthesia Other Findings   Reproductive/Obstetrics                          Anesthesia Physical Anesthesia Plan  ASA: II  Anesthesia Plan: Spinal   Post-op Pain Management:    Induction:   Airway Management Planned:   Additional Equipment:   Intra-op Plan:   Post-operative Plan:   Informed Consent: I have reviewed the patients History and Physical, chart, labs and discussed the procedure including the risks, benefits and alternatives for the proposed anesthesia with the patient or authorized representative who has indicated his/her understanding and acceptance.   Dental advisory given  Plan Discussed with: CRNA  Anesthesia Plan Comments:         Anesthesia Quick Evaluation

## 2012-02-01 NOTE — Transfer of Care (Signed)
Immediate Anesthesia Transfer of Care Note  Patient: Jack Haynes  Procedure(s) Performed: Procedure(s) (LRB) with comments: TOTAL KNEE ARTHROPLASTY (Left)  Patient Location: PACU  Anesthesia Type:Spinal  Level of Consciousness: awake, alert , oriented and patient cooperative  Airway & Oxygen Therapy: Patient Spontanous Breathing and Patient connected to face mask oxygen  Post-op Assessment: Report given to PACU RN and Post -op Vital signs reviewed and stable  Post vital signs: Reviewed and stable  Complications: No apparent anesthesia complications

## 2012-02-01 NOTE — Anesthesia Procedure Notes (Signed)
Spinal  Patient location during procedure: OR End time: 02/01/2012 3:29 PM Staffing CRNA/Resident: Enriqueta Shutter Performed by: resident/CRNA  Preanesthetic Checklist Completed: patient identified, site marked, surgical consent, pre-op evaluation, timeout performed, IV checked, risks and benefits discussed and monitors and equipment checked Spinal Block Patient position: sitting Prep: Betadine Patient monitoring: heart rate, continuous pulse ox and blood pressure Approach: midline Location: L3-4 Injection technique: single-shot Needle Needle type: Sprotte  Needle gauge: 25 G Needle length: 9 cm Additional Notes Expiration date of kit checked and confirmed. Patient tolerated procedure well, without complications.

## 2012-02-01 NOTE — Op Note (Signed)
DATE OF SURGERY:  02/01/2012  TIME: 5:07 PM  PATIENT NAME:  Jack Haynes    AGE: 71 y.o.   PRE-OPERATIVE DIAGNOSIS:  Osteoarthritis of the Left Knee  POST-OPERATIVE DIAGNOSIS:  Osteoarthritis of the Left Knee  PROCEDURE:  Procedure(s): TOTAL KNEE ARTHROPLASTY  SURGEON:  Jull Harral ANDREW  ASSISTANT:  Oneida Alar, PA-C, present and scrubbed throughout the case, critical for assistance with exposure, retraction, instrumentation, and closure.  OPERATIVE IMPLANTS: Depuy PFC Sigma Rotating Platform.  Femur size 3, Tibia size 4, Patella size 38 3-peg oval button, with a 10 mm polyethylene insert.   PREOPERATIVE INDICATIONS:   [name] is a 71 y.o. year old male with end stage bone on bone arthritis of the knee who failed conservative treatment and elected for Total Knee Arthroplasty.   The risks, benefits, and alternatives were discussed at length including but not limited to the risks of infection, bleeding, nerve injury, stiffness, blood clots, the need for revision surgery, cardiopulmonary complications, among others, and they were willing to proceed.  OPERATIVE DESCRIPTION:  The patient was brought to the operative room and placed in a supine position.  Spinal anesthesia was administered.  IV antibiotics were given.  The lower extremity was prepped and draped in the usual sterile fashion.  Time out was performed.  The leg was elevated and exsanguinated and the tourniquet was inflated.  Anterior quadriceps tendon splitting approach was performed.  The patella was retracted and osteophytes were removed.  The anterior horn of the medial and lateral meniscus was removed and cruciate ligaments resected.   The distal femur was opened with the drill and the intramedullary distal femoral cutting jig was utilized, set at 5 degrees resecting 10 mm off the distal femur.  Care was taken to protect the collateral ligaments.  The distal femoral sizing jig was applied, taking care to  avoid notching.  Then the 4-in-1 cutting jig was applied and the anterior and posterior femur was cut, along with the chamfer cuts.    Then the extramedullary tibial cutting jig was utilized making the appropriate cut using the anterior tibial crest as a reference building in appropriate posterior slope.  Care was taken during the cut to protect the medial and collateral ligaments.  The proximal tibia was removed along with the posterior horns of the menisci.   The posterior medial femoral osteophytes and posterior lateral femoral osteophytes were removed.    The flexion gap was then measured and was symmetric with the extension gap, measured at 10.  I completed the distal femoral preparation using the appropriate jig to prepare the box.  The patella was then measured, and cut with the saw.    The proximal tibia sized and prepared accordingly with the reamer and the punch, and then all components were trialed with the trial insert.  The knee was found to have excellent balance and full motion.    The above named components were then cemented into place and all excess cement was removed.  The trial polyethylene component was in place during cementation, and then was exchanged for the real polyethylene component.    The knee was easily taken through a range of motion and the patella tracked well and the knee irrigated copiously and the parapatellar and subcutaneous tissue closed with vicryl, and monocryl with steri strips for the skin.  The arthrotomy was closed at 90 of flexion. The wounds were dressed with sterile gauze and the tourniquet released and the patient was awakened and returned  to the PACU in stable and satisfactory condition.  There were no complications.  Total tourniquet time was 90 minutes.perosyeal injection of 60 cc 0.25%marcaine and 30 mg toradol. Vlock capsule closure.

## 2012-02-01 NOTE — H&P (Signed)
TOTAL KNEE ADMISSION H&P  Patient is being admitted for left total knee arthroplasty.  Subjective:  Chief Complaint:left knee pain.  HPI: Jack Haynes, 71 y.o. male, has a history of pain and functional disability in the left knee due to arthritis and has failed non-surgical conservative treatments for greater than 12 weeks to includeNSAID's and/or analgesics, corticosteriod injections, flexibility and strengthening excercises, supervised PT with diminished ADL's post treatment and activity modification.  Onset of symptoms was abrupt, starting 8 years ago with rapidlly worsening course since that time. The patient noted prior procedures on the knee to include  arthroscopy on the left knee(s).  Patient currently rates pain in the left knee(s) at 8 out of 10 with activity. Patient has night pain, worsening of pain with activity and weight bearing, pain that interferes with activities of daily living, pain with passive range of motion and crepitus.  Patient has evidence of subchondral cysts, sclerotic changes,osteophytes,bone on bone lateral joint by imaging studies. This patient has had arthroscopy. There is no active infection.  Patient Active Problem List   Diagnosis Date Noted  . Prostate cancer 11/01/2011   Past Medical History  Diagnosis Date  . Arthritis   . Cancer     hx of prostatae cancer    Past Surgical History  Procedure Date  . Knee surgery   . Back surgery   . Eye surgery   . Prostate surgery     Prescriptions prior to admission  Medication Sig Dispense Refill  . naproxen sodium (ANAPROX) 220 MG tablet Take 220 mg by mouth 2 (two) times a week.       No Known Allergies  History  Substance Use Topics  . Smoking status: Former Smoker    Quit date: 02/15/1981  . Smokeless tobacco: Current User    Types: Chew  . Alcohol Use: Yes     Comment: occasional beer or mixed drink    No family history on file.   Review of Systems  All other systems reviewed and are  negative.    Objective:  Physical Exam Cons alert well developed,amb with limp,head/neck WNL, no lymphadenopathy,lungs clear, heart RRR, Abd soft NT +BS, Up extrem WNL, Right leg good ROM hip,knee with midline incision good ROM,leg NMVI, Left leg good ROM hip,knee valgus deform, 5-110 ROM, leg NMVI, Breast,rectal Defurred.  Vital signs in last 24 hours: @VSRANGES @  Labs:   Estimated Body mass index is 28.74 kg/(m^2) as calculated from the following:   Height as of 11/01/11: 5\' 8" (1.727 m).   Weight as of 11/01/11: 189 lb(85.73 kg).   Imaging Review Plain radiographs demonstrate severe degenerative joint disease of the left knee(s). The overall alignment ismild valgus. The bone quality appears to be good for age and reported activity level.  Assessment/Plan:  End stage arthritis, left knee   The patient history, physical examination, clinical judgment of the provider and imaging studies are consistent with end stage degenerative joint disease of the left knee(s) and total knee arthroplasty is deemed medically necessary. The treatment options including medical management, injection therapy arthroscopy and arthroplasty were discussed at length. The risks and benefits of total knee arthroplasty were presented and reviewed. The risks due to aseptic loosening, infection, stiffness, patella tracking problems, thromboembolic complications and other imponderables were discussed. The patient acknowledged the explanation, agreed to proceed with the plan and consent was signed. Patient is being admitted for inpatient treatment for surgery, pain control, PT, OT, prophylactic antibiotics, VTE prophylaxis, progressive ambulation and ADL's and  discharge planning. The patient is planning to be discharged home with home health services  I have seen and examined this patient.  Agree with the note above.  Iyauna Sing ANDREW 02/01/2012 12:20 PM

## 2012-02-02 LAB — BASIC METABOLIC PANEL
BUN: 14 mg/dL (ref 6–23)
CO2: 26 mEq/L (ref 19–32)
Chloride: 103 mEq/L (ref 96–112)
Creatinine, Ser: 0.95 mg/dL (ref 0.50–1.35)
GFR calc Af Amer: >90 mL/min (ref >90)
Glucose, Bld: 118 mg/dL — ABNORMAL HIGH (ref 70–99)
Potassium: 4.1 mEq/L (ref 3.5–5.1)

## 2012-02-02 LAB — CBC
HCT: 36.4 % — ABNORMAL LOW (ref 39.0–52.0)
Hemoglobin: 12.3 g/dL — ABNORMAL LOW (ref 13.0–17.0)
MCV: 88.3 fL (ref 78.0–100.0)
RBC: 4.12 MIL/uL — ABNORMAL LOW (ref 4.22–5.81)
RDW: 12.3 % (ref 11.5–15.5)
WBC: 14.6 10*3/uL — ABNORMAL HIGH (ref 4.0–10.5)

## 2012-02-02 NOTE — Progress Notes (Signed)
Physical Therapy Treatment Patient Details Name: BROGHAN PANNONE MRN: [id number] DOB: [birth year] Today's Date: 02/02/2012 Time: 4098-1191 PT Time Calculation (min): 25 min  PT Assessment / Plan / Recommendation Comments on Treatment Session  Progressing well. O2 sats 96% on RA after ambulation and during exercises. Recommend HHPT.     Follow Up Recommendations  Home health PT     Does the patient have the potential to tolerate intense rehabilitation     Barriers to Discharge        Equipment Recommendations  Rolling walker with 5" wheels    Recommendations for Other Services    Frequency 7X/week   Plan Discharge plan remains appropriate    Precautions / Restrictions Precautions Precautions: Knee Required Braces or Orthoses: Knee Immobilizer - Left (DC'd KI 01/1812-pt able to SLR) Knee Immobilizer - Left: On except when in CPM Restrictions Weight Bearing Restrictions: No LLE Weight Bearing: Weight bearing as tolerated   Pertinent Vitals/Pain 5/10 L knee    Mobility  Bed Mobility Bed Mobility: Supine to Sit;Sit to Supine Supine to Sit: 6: Modified independent (Device/Increase time) Sit to Supine: 6: Modified independent (Device/Increase time) Details for Bed Mobility Assistance: Pt able to support L LE with R LE (hook technique).  Transfers Transfers: Sit to Stand;Stand to Sit Sit to Stand: 5: Supervision;From bed;With upper extremity assist Stand to Sit: 5: Supervision;To bed;With upper extremity assist Details for Transfer Assistance: VCS safety, hand placement.  Ambulation/Gait Ambulation/Gait Assistance: 4: Min guard Ambulation Distance (Feet): 125 Feet Assistive device: Rolling walker Ambulation/Gait Assistance Details: VCs safety. O2 sats 96% on RA, HR 106 bpm after ambulation.  Gait Pattern: Step-through pattern;Antalgic;Decreased stride length    Exercises Total Joint Exercises Ankle Circles/Pumps: AROM;Both;15 reps;Supine Quad Sets: AROM;Both;10  reps;Supine Short Arc Quad: AAROM;Left;10 reps;Supine Heel Slides: AAROM;Left;10 reps;Supine Hip ABduction/ADduction: AAROM;Left;10 reps;Supine Straight Leg Raises: AAROM;Left;10 reps;Supine   PT Diagnosis:    PT Problem List:   PT Treatment Interventions:     PT Goals Acute Rehab PT Goals Pt will go Supine/Side to Sit: with modified independence PT Goal: Supine/Side to Sit - Progress: Progressing toward goal Pt will go Sit to Supine/Side: with modified independence PT Goal: Sit to Supine/Side - Progress: Progressing toward goal Pt will go Sit to Stand: with modified independence PT Goal: Sit to Stand - Progress: Progressing toward goal Pt will Ambulate: >150 feet;with modified independence;with least restrictive assistive device PT Goal: Ambulate - Progress: Progressing toward goal  Visit Information  Last PT Received On: 02/02/12 Assistance Needed: +1    Subjective Data  Subjective: "I've been up and down." Patient Stated Goal: home   Cognition  Overall Cognitive Status: Appears within functional limits for tasks assessed/performed Arousal/Alertness: Awake/alert Orientation Level: Appears intact for tasks assessed Behavior During Session: [facility] for tasks performed    Balance     End of Session PT - End of Session Equipment Utilized During Treatment: Gait belt Activity Tolerance: Patient tolerated treatment well Patient left: in bed;with call bell/phone within reach   GP     Rebeca Alert [facility] 02/02/2012, 2:43 PM [phone]

## 2012-02-02 NOTE — Progress Notes (Signed)
Subjective: Patient states he is doing well he has no complaints no shortness of breath chest pains tolerated fluid last night while was able to sit over the edge of the bed was free dangling fairly comfortable eager and ready to proceed with physical therapy today   Objective: Vital signs in last 24 hours: Temp:  [97.4 F (36.3 C)-98.7 F (37.1 C)] 97.4 F (36.3 C) (12/18 0544) Pulse Rate:  [64-81] 76  (12/18 0544) Resp:  [12-19] 16  (12/18 0544) BP: (103-166)/(55-91) 134/73 mmHg (12/18 0544) SpO2:  [95 %-100 %] 95 % (12/18 0544) Weight:  [83 kg (182 lb 15.7 oz)] 83 kg (182 lb 15.7 oz) (12/17 1945)  Intake/Output from previous day: 12/17 0701 - 12/18 0700 In: 4273.8 [P.O.:720; I.V.:3303.8; IV Piggyback:250] Out: 1400 [Urine:1100; Drains:200; Blood:100] Intake/Output this shift: Total I/O In: 1673.8 [P.O.:720; I.V.:703.8; IV Piggyback:250] Out: 1070 [Urine:900; Drains:170]   Basename 02/02/12 0450  HGB 12.3*    Basename 02/02/12 0450  WBC 14.6*  RBC 4.12*  HCT 36.4*  PLT 205    Basename 02/02/12 0450  NA 134*  K 4.1  CL 103  CO2 26  BUN 14  CREATININE 0.95  GLUCOSE 118*  CALCIUM 8.3*   No results found for this basename: LABPT:2,INR:2 in the last 72 hours  Patient's conscious alert and appropriate appears to be good historian appears to be very comfortable no distress lung sounds were clear throughout his heart was regular his abdomen was soft bowel sounds present left lower extremity dressing was intact Hemovac drain was DC'd intact his calf and thigh were soft and nontender his leg was neuromotor vascularly intact  Assessment/Plan: Postop day #1 status post left total knee arthroplasty doing very well Mild hyponatremia well allowed to self correct with IV hydration  Plan we will have the patient progressed with physical therapy and CPM per total knee protocol continue with IV hydration will change dressing tomorrow possible discharge to home on  Friday   Shaliah Wann W 02/02/2012, 6:42 AM

## 2012-02-02 NOTE — Progress Notes (Signed)
Utilization review completed.  

## 2012-02-02 NOTE — Evaluation (Signed)
Physical Therapy Evaluation Patient Details Name: Jack Haynes MRN: [id number] DOB: 01-Apr-1940 Today's Date: 02/02/2012 Time: 1012-1036 PT Time Calculation (min): 24 min  PT Assessment / Plan / Recommendation Clinical Impression  71 yo male s/p L TKA. Mobilizing well. O2 sats dropping throughout session-83% RA, 112 bpm. Pt tolerated session well. Denies SOB. Anticipate pt will progress well during stay. Recommend HHPT and RW.     PT Assessment  Patient needs continued PT services    Follow Up Recommendations  Home health PT    Does the patient have the potential to tolerate intense rehabilitation      Barriers to Discharge        Equipment Recommendations  Rolling walker with 5" wheels    Recommendations for Other Services OT consult   Frequency 7X/week    Precautions / Restrictions Precautions Precautions: Knee Required Braces or Orthoses: Knee Immobilizer - Left Knee Immobilizer - Left: On except when in CPM Restrictions Weight Bearing Restrictions: No LLE Weight Bearing: Weight bearing as tolerated   Pertinent Vitals/Pain 8/10 at rest and during activity L knee      Mobility  Bed Mobility Bed Mobility: Supine to Sit Supine to Sit: 6: Modified independent (Device/Increase time);HOB flat Transfers Transfers: Sit to Stand;Stand to Sit Sit to Stand: 4: Min assist;From bed;With upper extremity assist Stand to Sit: 4: Min guard;To chair/3-in-1;With armrests;With upper extremity assist Details for Transfer Assistance: VCs safety, technique, hand placement. Assist to rise, stabilize, control descent.  Ambulation/Gait Ambulation/Gait Assistance: 4: Min guard Ambulation Distance (Feet): 60 Feet Assistive device: Rolling walker Ambulation/Gait Assistance Details: VCs safety, sequence, step length.  Gait Pattern: Step-to pattern;Step-through pattern;Antalgic;Decreased stride length;Decreased step length - left    Shoulder Instructions     Exercises     PT  Diagnosis: Difficulty walking;Abnormality of gait;Acute pain  PT Problem List: Decreased range of motion;Decreased strength;Decreased mobility;Pain;Decreased knowledge of use of DME;Decreased knowledge of precautions PT Treatment Interventions: DME instruction;Gait training;Stair training;Therapeutic activities;Functional mobility training;Therapeutic exercise;Patient/family education   PT Goals Acute Rehab PT Goals PT Goal Formulation: With patient Time For Goal Achievement: 02/09/12 Potential to Achieve Goals: Good Pt will go Supine/Side to Sit: with modified independence PT Goal: Supine/Side to Sit - Progress: Goal set today Pt will go Sit to Supine/Side: with modified independence PT Goal: Sit to Supine/Side - Progress: Goal set today Pt will go Sit to Stand: with modified independence PT Goal: Sit to Stand - Progress: Goal set today Pt will Ambulate: >150 feet;with modified independence;with least restrictive assistive device PT Goal: Ambulate - Progress: Goal set today Pt will Go Up / Down Stairs: 1-2 stairs;with modified independence;with least restrictive assistive device PT Goal: Up/Down Stairs - Progress: Goal set today  Visit Information  Last PT Received On: 02/02/12 Assistance Needed: +1    Subjective Data  Subjective: "I'm alright" Patient Stated Goal: home   Prior Functioning  Home Living Lives With: Spouse Type of Home: House Home Access: Stairs to enter Secretary/administrator of Steps: 1 Entrance Stairs-Rails: None Home Layout: One level Bathroom Shower/Tub: Health visitor: Standard Home Adaptive Equipment: Arts administrator - standard Prior Function Level of Independence: Independent Able to Take Stairs?: Yes Driving: Yes Communication Communication: No difficulties    Cognition  Overall Cognitive Status: Appears within functional limits for tasks assessed/performed Arousal/Alertness: Awake/alert Orientation Level: Appears  intact for tasks assessed Behavior During Session: [facility] for tasks performed    Extremity/Trunk Assessment Right Lower Extremity Assessment RLE ROM/Strength/Tone: [facility] for tasks assessed Left  Lower Extremity Assessment LLE ROM/Strength/Tone: Deficits LLE ROM/Strength/Tone Deficits: moves ankle well. Knee ext 2/5. SLR 3/5 Trunk Assessment Trunk Assessment: Normal   Balance    End of Session PT - End of Session Equipment Utilized During Treatment: Gait belt Activity Tolerance: Patient tolerated treatment well Patient left: in chair;with call bell/phone within reach;with family/visitor present  GP     Rebeca Alert [facility] 02/02/2012, 10:42 AM [phone]

## 2012-02-02 NOTE — Evaluation (Signed)
Occupational Therapy Evaluation Patient Details Name: Jack Haynes MRN: [id number] DOB: Aug 28, 1940 Today's Date: 02/02/2012 Time: 7829-5621 OT Time Calculation (min): 23 min  OT Assessment / Plan / Recommendation Clinical Impression  Pt doing very well POD 1 LTKR. All education completed. Pt will have prn A at home. Will need 3n1 for home use.    OT Assessment  Patient does not need any further OT services    Follow Up Recommendations  No OT follow up    Barriers to Discharge      Equipment Recommendations  3 in 1 bedside comode    Recommendations for Other Services    Frequency       Precautions / Restrictions Precautions Precautions: Knee Required Braces or Orthoses: Knee Immobilizer - Left Knee Immobilizer - Left: On except when in CPM Restrictions Weight Bearing Restrictions: No LLE Weight Bearing: Weight bearing as tolerated   Pertinent Vitals/Pain Reported 5/10 pain post session. Repositioned and cold applied.    ADL  Grooming: Supervision/safety Where Assessed - Grooming: Unsupported standing Upper Body Bathing: Supervision/safety Where Assessed - Upper Body Bathing: Unsupported standing Lower Body Bathing: Supervision/safety Where Assessed - Lower Body Bathing: Supported sit to stand Upper Body Dressing: Supervision/safety Where Assessed - Upper Body Dressing: Unsupported standing Lower Body Dressing: Supervision/safety Where Assessed - Lower Body Dressing: Supported sit to Pharmacist, hospital: Supervision/safety Statistician Method: Sit to Barista: Raised toilet seat with arms (or 3-in-1 over toilet) Toileting - Clothing Manipulation and Hygiene: Supervision/safety Where Assessed - Engineer, mining and Hygiene: Sit to stand from 3-in-1 or toilet Tub/Shower Transfer: Supervision/safety Tub/Shower Transfer Method: Science writer: Walk in shower Transfers/Ambulation Related to ADLs: Pt  ambulated to and from the bathroom with supervision. Was able to don pants without any physical  A from therapist. Educated pt how to safely step into and out of a walk in shower. Pt performed x2 with good return demo.    OT Diagnosis:    OT Problem List:   OT Treatment Interventions:     OT Goals    Visit Information  Last OT Received On: 02/02/12 Assistance Needed: +1    Subjective Data  Subjective: I had the same surgery ~2 yrs ago. Patient Stated Goal: Return home.   Prior Functioning     Home Living Lives With: Spouse Available Help at Discharge: Family Type of Home: House Home Access: Stairs to enter Secretary/administrator of Steps: 1 Entrance Stairs-Rails: None Home Layout: One level Bathroom Shower/Tub: Health visitor: Standard Home Adaptive Equipment: Medical illustrator - standard Prior Function Level of Independence: Independent Able to Take Stairs?: Yes Driving: Yes Vocation: Full time employment Communication Communication: No difficulties Dominant Hand: Right         Vision/Perception     Cognition  Overall Cognitive Status: Appears within functional limits for tasks assessed/performed Arousal/Alertness: Awake/alert Orientation Level: Appears intact for tasks assessed Behavior During Session: [facility] for tasks performed    Extremity/Trunk Assessment Right Upper Extremity Assessment RUE ROM/Strength/Tone: [facility] for tasks assessed Left Upper Extremity Assessment LUE ROM/Strength/Tone: WFL for tasks assessed     Mobility  Transfers Sit to Stand: 5: Supervision;From chair/3-in-1;With upper extremity assist Stand to Sit: 5: Supervision;With upper extremity assist;With armrests;To chair/3-in-1 Details for Transfer Assistance: min cues for safety and hand placement.     Shoulder Instructions     Exercise     Balance     End of Session OT - End of  Session Activity Tolerance: Patient tolerated  treatment well Patient left: in chair;with call bell/phone within reach;with family/visitor present  GO     Bayne Fosnaugh A OTR/L [phone] 02/02/2012, 12:12 PM

## 2012-02-03 ENCOUNTER — Encounter (HOSPITAL_COMMUNITY): Payer: Self-pay | Admitting: Specialist

## 2012-02-03 LAB — CBC
MCV: 88.5 fL (ref 78.0–100.0)
Platelets: 182 10*3/uL (ref 150–400)
RDW: 12.2 % (ref 11.5–15.5)
WBC: 14.2 10*3/uL — ABNORMAL HIGH (ref 4.0–10.5)

## 2012-02-03 MED ORDER — BLISTEX EX OINT
TOPICAL_OINTMENT | CUTANEOUS | Status: AC
  Filled 2012-02-03: qty 10

## 2012-02-03 NOTE — Care Management Note (Addendum)
    Page 1 of 2   02/04/2012     11:26:27 AM   CARE MANAGEMENT NOTE 02/04/2012  Patient:  Jack Haynes, Jack Haynes   Account Number:  [id number]  Date Initiated:  02/02/2012  Documentation initiated by:  Colleen Can  Subjective/Objective Assessment:   DX OSTEOARTHRITIS LEFT KNEE; TOTAL KNEE REPLACEMNT     Action/Plan:   CM spoke with patient and spouse. Plans are for patient to return to his home where spouse will be caregiver. He will need RW and 3n1 and CPM   Anticipated DC Date:  02/04/2012   Anticipated DC Plan:  HOME W HOME HEALTH SERVICES  In-house referral  Clinical Social Worker      DC Associate Professor  CM consult      [facility] Choice  HOME HEALTH  DURABLE MEDICAL EQUIPMENT   Choice offered to / List presented to:  C-1 Patient   DME arranged  3-N-1  Levan Hurst      DME agency  [facility]     HH arranged  HH-2 PT      [facility] agency  [facility]   Status of service:  Completed, signed off Medicare Important Message given?  NA - LOS <3 / Initial given by admissions (If response is "NO", the following Medicare IM given date fields will be blank) Date Medicare IM given:   Date Additional Medicare IM given:    Discharge Disposition:  HOME W HOME HEALTH SERVICES  Per UR Regulation:    If discussed at Long Length of Stay Meetings, dates discussed:    Comments:  02/04/2012 Colleen Can BSN RN CCM [phone] Pt for discharge today. [facility] notified of discharge. Contact information for [facility] agency given to patient.  02/03/2012 Colleen Can BSN RN CCM [phone] Contact made with [facility] who verified pt's insurance and can take [facility]. Face sheet, HH orders, op note and H&P faxed with confirmation on 02/02/2012. [facility] can start services on 02/05/2012. CPM was ordered via doctor's office. This was verified via [facility]

## 2012-02-03 NOTE — Progress Notes (Signed)
Subjective: Patient has no complaints feels like he is doing well he has been voiding and had an he has had a bowel movement. Does not have any pain medicine problems he is eating okay   Objective: Vital signs in last 24 hours: Temp:  [97.9 F (36.6 C)-98.5 F (36.9 C)] 98.5 F (36.9 C) (12/19 0552) Pulse Rate:  [70-95] 70  (12/19 0552) Resp:  [16-18] 16  (12/19 0552) BP: (126-133)/(56-76) 126/74 mmHg (12/19 0552) SpO2:  [94 %-97 %] 96 % (12/19 0552)  Intake/Output from previous day: 12/18 0701 - 12/19 0700 In: 1805.8 [P.O.:600; I.V.:1205.8] Out: 301 [Urine:301] Intake/Output this shift: Total I/O In: 615.8 [P.O.:120; I.V.:495.8] Out: 301 [Urine:301]   Basename 02/03/12 0436 02/02/12 0450  HGB 11.0* 12.3*    Basename 02/03/12 0436 02/02/12 0450  WBC 14.2* 14.6*  RBC 3.58* 4.12*  HCT 31.7* 36.4*  PLT 182 205    Basename 02/02/12 0450  NA 134*  K 4.1  CL 103  CO2 26  BUN 14  CREATININE 0.95  GLUCOSE 118*  CALCIUM 8.3*   No results found for this basename: LABPT:2,INR:2 in the last 72 hours  Patient is conscious alert and appropriate appears to be very comfortable his left leg dressing was intact the dressing was taken down he has a little bit of drainage and small pressure blister very distal and no signs of infection the wound is well approximated with Steri-Strips his calf and thigh are soft and nontender his leg is neuromotor vascularly intact dressing was replaced  Assessment/Plan: Postop day #2 status post left total knee arthroplasty doing well  Plan we discussed elevation and ice to help prevent further stop pressure blister develop development and help with swelling otherwise she's been continue with physical therapy and CPM today per protocol next back discharge to home tomorrow with home health physical therapy   Jamelle Rushing 02/03/2012, 6:32 AM

## 2012-02-03 NOTE — Progress Notes (Signed)
Physical Therapy Treatment Patient Details Name: Jack Haynes MRN: [id number] DOB: 07-12-1940 Today's Date: 02/03/2012 Time: 0922-1002 PT Time Calculation (min): 40 min  PT Assessment / Plan / Recommendation Comments on Treatment Session  POD # 2 L TKR am session.  Amb pt in hallway then performed TE's. Pt given handout on HEP and instructed on use of KI for amb and when to D/C as hwe was unable to perform SLR due to increased c/o pain.    Follow Up Recommendations  Home health PT     Does the patient have the potential to tolerate intense rehabilitation     Barriers to Discharge        Equipment Recommendations  Rolling walker with 5" wheels    Recommendations for Other Services    Frequency 7X/week   Plan Discharge plan remains appropriate    Precautions / Restrictions Precautions Precautions: Knee Precaution Comments: Instructed pt on use of KI for amb and when to D/C Required Braces or Orthoses: Knee Immobilizer - Left Knee Immobilizer - Left: Discontinue once straight leg raise with < 10 degree lag Restrictions Weight Bearing Restrictions: No LLE Weight Bearing: Weight bearing as tolerated   Pertinent Vitals/Pain C/o 6/10 during TE's ICE applied    Mobility  Bed Mobility Bed Mobility: Not assessed Details for Bed Mobility Assistance: Pt OOB in recliner Transfers Transfers: Sit to Stand;Stand to Sit Sit to Stand: 5: Supervision;From chair/3-in-1 Stand to Sit: 5: Supervision;To chair/3-in-1 Details for Transfer Assistance: <25% VC's on proper tech and hand placement Ambulation/Gait Ambulation/Gait Assistance: 5: Supervision;4: Min guard Ambulation Distance (Feet): 145 Feet Assistive device: Rolling walker Ambulation/Gait Assistance Details: <25% VC's on safety with proper walker to self distance and safety with turns. Gait Pattern: Step-through pattern;Decreased stance time - left Gait velocity: decreased    Exercises Total Joint Exercises Ankle  Circles/Pumps: AROM;Both;10 reps;Supine Quad Sets: AROM;Both;10 reps;Supine Gluteal Sets: AROM;Both;10 reps;Supine Towel Squeeze: AROM;Both;10 reps;Supine Short Arc Quad: AAROM;Left;10 reps;Supine Heel Slides: AAROM;Left;10 reps;Supine Hip ABduction/ADduction: AAROM;Left;10 reps;Supine Straight Leg Raises: AAROM;Left;10 reps;Supine    PT Goals    Visit Information  Last PT Received On: 02/03/12 Assistance Needed: +1    Subjective Data  Subjective: Are you going to hurt me? Patient Stated Goal: home   Cognition       Balance     End of Session PT - End of Session Equipment Utilized During Treatment: Gait belt;Left knee immobilizer Activity Tolerance: Patient tolerated treatment well Patient left: in chair;with call bell/phone within reach (ICE to L knee)   Felecia Shelling  PTA [facility]  Acute  Rehab Pager     [phone]

## 2012-02-03 NOTE — Progress Notes (Signed)
Physical Therapy Treatment Patient Details Name: Jack Haynes MRN: [id number] DOB: February 01, 1941 Today's Date: 02/03/2012 Time: 4098-1191 PT Time Calculation (min): 25 min  PT Assessment / Plan / Recommendation Comments on Treatment Session  POD # 2 pm session.  Assisted OOB to amb in halwway, then assisted back to bed and applied ICE.  Pt plans to D/C to home tomorrow.    Follow Up Recommendations  Home health PT     Does the patient have the potential to tolerate intense rehabilitation     Barriers to Discharge        Equipment Recommendations  Rolling walker with 5" wheels    Recommendations for Other Services    Frequency 7X/week   Plan Discharge plan remains appropriate    Precautions / Restrictions Precautions Precautions: Knee Precaution Comments: Instructed pt on use of KI for amb and when to D/C Required Braces or Orthoses: Knee Immobilizer - Left Knee Immobilizer - Left: Discontinue once straight leg raise with < 10 degree lag Restrictions Weight Bearing Restrictions: No LLE Weight Bearing: Weight bearing as tolerated   Pertinent Vitals/Pain C/o 4/10 L knee pain with gait ICE    Mobility  Bed Mobility Bed Mobility: Supine to Sit;Sit to Supine Supine to Sit: 5: Supervision;4: Min guard Sit to Supine: 5: Supervision;4: Min guard Details for Bed Mobility Assistance: increased time Transfers Transfers: Sit to Stand;Stand to Sit Sit to Stand: 5: Supervision;From bed Stand to Sit: 5: Supervision;To bed Details for Transfer Assistance: increased time Ambulation/Gait Ambulation/Gait Assistance: 5: Supervision Ambulation Distance (Feet): 185 Feet Assistive device: Rolling walker Ambulation/Gait Assistance Details: <25% VC's on safety with proper walker to self distance and safety with turns. Gait Pattern: Step-to pattern;Decreased stance time - left Gait velocity: decreased        PT Goals    Visit Information  Last PT Received On: 02/03/12 Assistance  Needed: +1    Subjective Data  Subjective: Are you going to hurt me? Patient Stated Goal: home   Cognition       Balance     End of Session PT - End of Session Equipment Utilized During Treatment: Gait belt;Left knee immobilizer Activity Tolerance: Patient tolerated treatment well Patient left: with call bell/phone within reach;with family/visitor present (ICE to L knee)   Felecia Shelling  PTA [facility]  Acute  Rehab Pager     [phone]

## 2012-02-04 LAB — CBC
Hemoglobin: 9.8 g/dL — ABNORMAL LOW (ref 13.0–17.0)
MCHC: 34.9 g/dL (ref 30.0–36.0)
RDW: 12.2 % (ref 11.5–15.5)
WBC: 11.5 10*3/uL — ABNORMAL HIGH (ref 4.0–10.5)

## 2012-02-04 MED ORDER — FERROUS SULFATE 325 (65 FE) MG PO TABS: 325.0000 mg | ORAL_TABLET | Freq: Three times a day (TID) | ORAL | Status: DC

## 2012-02-04 MED ORDER — METHOCARBAMOL 500 MG PO TABS: 500.0000 mg | ORAL_TABLET | Freq: Four times a day (QID) | ORAL | Status: DC | PRN

## 2012-02-04 MED ORDER — DSS 100 MG PO CAPS: 100.0000 mg | ORAL_CAPSULE | Freq: Two times a day (BID) | ORAL | Status: DC

## 2012-02-04 MED ORDER — ASPIRIN 325 MG PO TABS: 325.0000 mg | ORAL_TABLET | Freq: Two times a day (BID) | ORAL | Status: DC

## 2012-02-04 MED ORDER — OXYCODONE HCL 5 MG PO TABS: 5.0000 mg | ORAL_TABLET | ORAL | Status: DC | PRN

## 2012-02-04 MED ORDER — CEPHALEXIN 500 MG PO CAPS: 500.0000 mg | ORAL_CAPSULE | Freq: Four times a day (QID) | ORAL | Status: DC

## 2012-02-04 NOTE — Progress Notes (Signed)
Physical Therapy Treatment Patient Details Name: Jack Haynes MRN: [id number] DOB: 02/05/1941 Today's Date: 02/04/2012 Time: 1015-1040 PT Time Calculation (min): 25 min  PT Assessment / Plan / Recommendation Comments on Treatment Session  POD # 3 pt plans to D/C to home today.  Practiced steps with caregiver and re educated on KI use for amb and when to D/C.    Follow Up Recommendations  Home health PT     Does the patient have the potential to tolerate intense rehabilitation     Barriers to Discharge        Equipment Recommendations  Rolling walker with 5" wheels    Recommendations for Other Services    Frequency 7X/week   Plan Discharge plan remains appropriate    Precautions / Restrictions Precautions Precautions: Knee Precaution Comments: Pt unable to perform 10 active SLR so re applied KI and re educated on use Required Braces or Orthoses: Knee Immobilizer - Left Knee Immobilizer - Left: Discontinue once straight leg raise with < 10 degree lag Restrictions Weight Bearing Restrictions: No LLE Weight Bearing: Weight bearing as tolerated Other Position/Activity Restrictions: WBAT   Pertinent Vitals/Pain C/o 4/10 during gait    Mobility  Bed Mobility Bed Mobility: Supine to Sit;Sitting - Scoot to Edge of Bed Supine to Sit: 5: Supervision Sitting - Scoot to Edge of Bed: 5: Supervision Details for Bed Mobility Assistance: increased time Transfers Transfers: Sit to Stand;Stand to Sit Sit to Stand: 5: Supervision;From bed Stand to Sit: 5: Supervision;To bed Details for Transfer Assistance: one VC on safety to use RW during sit to stand (impulsive) Ambulation/Gait Ambulation/Gait Assistance: 5: Supervision Ambulation Distance (Feet): 210 Feet Assistive device: Rolling walker Ambulation/Gait Assistance Details: <25% VC's on safety with proper walker to self distance and safety with turns Gait Pattern: Step-to pattern;Decreased stance time - left;Trunk flexed Gait  velocity: decreased Stairs: Yes Stairs Assistance: 4: Min assist Stairs Assistance Details (indicate cue type and reason): performed with caregiver who was taking him home and 50% VC's on proper sequencing Stair Management Technique: No rails;Backwards;With walker Number of Stairs: 2      PT Goals                                          progressing    Visit Information  Last PT Received On: 02/04/12 Assistance Needed: +1                   End of Session PT - End of Session Equipment Utilized During Treatment: Gait belt;Left knee immobilizer Activity Tolerance: Patient tolerated treatment well Patient left: in bed;with call bell/phone within reach;with family/visitor present   Felecia Shelling  PTA [facility]  Acute  Rehab Pager     [phone]

## 2012-02-04 NOTE — Progress Notes (Signed)
Subjective: Patient feels like he is doing buried well he has no specific complaints   Objective: Vital signs in last 24 hours: Temp:  [99.4 F (37.4 C)-100.1 F (37.8 C)] 99.4 F (37.4 C) (12/20 0445) Pulse Rate:  [98-106] 98  (12/20 0445) Resp:  [16-18] 18  (12/20 0445) BP: (137-151)/(78-79) 137/79 mmHg (12/20 0445) SpO2:  [92 %-95 %] 92 % (12/20 0445)  Intake/Output from previous day: 12/19 0701 - 12/20 0700 In: 840 [P.O.:840] Out: 300 [Urine:300] Intake/Output this shift: Total I/O In: 480 [P.O.:480] Out: 300 [Urine:300]   Basename 02/04/12 0415 02/03/12 0436 02/02/12 0450  HGB 9.8* 11.0* 12.3*    Basename 02/04/12 0415 02/03/12 0436  WBC 11.5* 14.2*  RBC 3.19* 3.58*  HCT 28.1* 31.7*  PLT 163 182    Basename 02/02/12 0450  NA 134*  K 4.1  CL 103  CO2 26  BUN 14  CREATININE 0.95  GLUCOSE 118*  CALCIUM 8.3*   No results found for this basename: LABPT:2,INR:2 in the last 72 hours  Patient is conscious alert and appropriate very comfortable-appearing in his hospital bed. Left leg incision is well approximate Steri-Strips he developed several pressure blisters along the proximal very distal and one distal and is draining clear is no signs of infection and thigh are soft and nontender his leg is neuromotor vascularly intact  Assessment/Plan: Postop day #3 status post left total knee arthroplasty doing well wound has developed some pressure blisters are clean majority of them are intact there is no signs of infection his calf is soft nontender CPM 0-60 Acute postoperative blood loss anemia asymptomatic we'll allow self correct with by mouth supplementation  Plan patient will continue with physical therapy per protocol will in the hospital discharge home later today with home health physical therapy and the CPM. Patient went 2 week followup evaluation with Dr. Thomasena Edis in the office. We will place him on by mouth supplementation for his anemia. Converted to aspirin  325 mg twice a day for his DVT prophylaxis. Also put him on an oral antibiotic as prophylaxis due to the draining pressure blisters.   Jamelle Rushing 02/04/2012, 6:26 AM

## 2012-02-04 NOTE — Discharge Summary (Signed)
Physician Discharge Summary  Patient ID: Jack Haynes MRN: [id number] DOB/AGE: [birth year] 71 y.o.  Admit date: 02/01/2012 Discharge date: 02/04/2012  Admission Diagnoses: End-stage osteoarthritis left knee History of prostate disease  Discharge Diagnoses:  Status post left total knee arthroplasty Postoperative acute blood loss anemia asymptomatic we'll allow to self correct by mouth supplementation History of prostate disease   Discharged Condition: good  Hospital Course: Patient was admitted [facility] the care of Dr. Valma Cava patient was taken to the OR were a left total knee arthroplasty was performed patient was transferred to recovery room and then to the orthopedic floor in good condition to follow total knee protocol. Patient encouraged 3 days postop course in which he had no significant untoward events his vital signs remained stable he was able to participate on a daily basis with physical therapy his pain was well-controlled. Postop day #1 his Hemovac drain was DC'd intact. Postop day #2 is able to come off IV fluids tolerated by mouth meds well his dressing was changed no signs of infection his wound is well approximated but he did start develop some pressure blisters. These remained clean without any signs of infection. Postop day #3 patient doing very well vital sign stable was very comfortable he was able to participate with physical therapy nicely. He did have some pressure blisters on his wounds there were clean there were not in any signs of infection around the knee. His dressing was changed. He will be discharged home today with home health physical therapy and CPM but will also, antibiotic prophylactics due to his total knee arthroplasty and pressure blisters  Consults: None  Significant Diagnostic Studies: Routine total knee arthroplasty labs  Treatments: Routine total knee arthroplasty protocol  Discharge Exam: Blood pressure 137/79, pulse 98,  temperature 99.4 F (37.4 C), temperature source Oral, resp. rate 18, height 5\' 8"  (1.727 m), weight 83 kg (182 lb 15.7 oz), SpO2 92.00%. Patient is conscious alert and appropriate very comfortable in bed. His left knee wound was well approximated with Steri-Strips he did have some pressure blisters well proximal and distal on the wound he did have one draining on the distal aspect of the wound there is no signs of infection about the knee he did have some bruising about the anterior part of his knee. A clean dressing was reapplied his calf and thigh were soft and nontender his leg was neuromotor vascularly intact  Disposition: 01-Home or Self Care  Discharge Orders    Future Orders Please Complete By Expires   Diet general      Call MD / Call 911      Comments:   If you experience chest pain or shortness of breath, CALL 911 and be transported to the hospital emergency room.  If you develope a fever above 101 F, pus (white drainage) or increased drainage or redness at the wound, or calf pain, call your surgeon's office.   Increase activity slowly as tolerated      Discharge instructions      Comments:   Keep her wound clean and dry and change the dressing on a daily basis more frequently if needed. Call [phone] for a followup appointment with Dr. Thomasena Edis in 2 weeks. Call if any concerns or questions or problems. Keep leg elevated and ice on it is much is possible   CPM      Comments:   Continuous passive motion machine (CPM):      Use the CPM from 0 to  60 for 6 hours per day.      You may increase by 10 per day.  You may break it up into 2 or 3 sessions per day.      Use CPM for 2 weeks or until you are told to stop.   TED hose      Comments:   Use stockings (TED hose) for 3 weeks on both leg(s).  You may remove them at night for sleeping.   Change dressing      Comments:   Change dressing daily with sterile 4 x 4 inch gauze dressing and apply TED hose.  You may clean the incision with  alcohol prior to redressing.   Do not put a pillow under the knee. Place it under the heel.          Medication List     As of 02/04/2012  6:43 AM    STOP taking these medications         naproxen sodium 220 MG tablet   Commonly known as: ANAPROX      TAKE these medications         cephALEXin 500 MG capsule   Commonly known as: KEFLEX   Take 1 capsule (500 mg total) by mouth 4 (four) times daily.      DSS 100 MG Caps   Take 100 mg by mouth 2 (two) times daily.      ferrous sulfate 325 (65 FE) MG tablet   Take 1 tablet (325 mg total) by mouth 3 (three) times daily after meals.      methocarbamol 500 MG tablet   Commonly known as: ROBAXIN   Take 1 tablet (500 mg total) by mouth every 6 (six) hours as needed.      oxyCODONE 5 MG immediate release tablet   Commonly known as: Oxy IR/ROXICODONE   Take 1-2 tablets (5-10 mg total) by mouth every 3 (three) hours as needed.         SignedJamelle Rushing 02/04/2012, 6:43 AM

## 2012-02-29 ENCOUNTER — Ambulatory Visit: Payer: Medicare PPO | Attending: Specialist | Admitting: Physical Therapy

## 2012-02-29 DIAGNOSIS — Z96659 Presence of unspecified artificial knee joint: Secondary | ICD-10-CM | POA: Insufficient documentation

## 2012-02-29 DIAGNOSIS — R262 Difficulty in walking, not elsewhere classified: Secondary | ICD-10-CM | POA: Insufficient documentation

## 2012-02-29 DIAGNOSIS — M25669 Stiffness of unspecified knee, not elsewhere classified: Secondary | ICD-10-CM | POA: Insufficient documentation

## 2012-02-29 DIAGNOSIS — IMO0001 Reserved for inherently not codable concepts without codable children: Secondary | ICD-10-CM | POA: Insufficient documentation

## 2012-03-02 ENCOUNTER — Ambulatory Visit: Payer: Medicare PPO | Admitting: Rehabilitation

## 2012-03-06 ENCOUNTER — Ambulatory Visit: Payer: Medicare PPO | Admitting: Physical Therapy

## 2012-03-08 ENCOUNTER — Ambulatory Visit: Payer: Medicare PPO | Admitting: Rehabilitative and Restorative Service Providers"

## 2012-03-10 ENCOUNTER — Ambulatory Visit: Payer: Medicare PPO | Admitting: Physical Therapy

## 2012-03-13 ENCOUNTER — Ambulatory Visit: Payer: Medicare PPO | Admitting: Physical Therapy

## 2012-03-15 ENCOUNTER — Ambulatory Visit: Payer: Medicare PPO | Admitting: Physical Therapy

## 2012-03-17 ENCOUNTER — Ambulatory Visit: Payer: Medicare PPO | Admitting: Physical Therapy

## 2012-03-20 ENCOUNTER — Ambulatory Visit: Payer: Medicare PPO | Attending: Specialist | Admitting: Physical Therapy

## 2012-03-20 DIAGNOSIS — R262 Difficulty in walking, not elsewhere classified: Secondary | ICD-10-CM | POA: Insufficient documentation

## 2012-03-20 DIAGNOSIS — Z96659 Presence of unspecified artificial knee joint: Secondary | ICD-10-CM | POA: Insufficient documentation

## 2012-03-20 DIAGNOSIS — IMO0001 Reserved for inherently not codable concepts without codable children: Secondary | ICD-10-CM | POA: Insufficient documentation

## 2012-03-20 DIAGNOSIS — M25669 Stiffness of unspecified knee, not elsewhere classified: Secondary | ICD-10-CM | POA: Insufficient documentation

## 2012-03-22 ENCOUNTER — Ambulatory Visit: Payer: Medicare PPO | Admitting: Physical Therapy

## 2012-03-28 ENCOUNTER — Ambulatory Visit: Payer: Medicare PPO | Admitting: Physical Therapy

## 2012-04-04 ENCOUNTER — Ambulatory Visit: Payer: Medicare PPO | Admitting: Physical Therapy

## 2012-04-07 ENCOUNTER — Ambulatory Visit: Payer: Medicare PPO

## 2012-04-10 ENCOUNTER — Ambulatory Visit: Payer: Medicare PPO

## 2012-04-14 ENCOUNTER — Ambulatory Visit: Payer: Medicare PPO

## 2012-05-11 ENCOUNTER — Ambulatory Visit (INDEPENDENT_AMBULATORY_CARE_PROVIDER_SITE_OTHER): Payer: Medicare PPO | Admitting: Family Medicine

## 2012-05-11 ENCOUNTER — Ambulatory Visit: Payer: Medicare PPO

## 2012-05-11 VITALS — BP 124/78 | HR 68 | Temp 97.9°F | Resp 16 | Ht 69.0 in | Wt 181.0 lb

## 2012-05-11 DIAGNOSIS — M25559 Pain in unspecified hip: Secondary | ICD-10-CM

## 2012-05-11 DIAGNOSIS — M545 Low back pain, unspecified: Secondary | ICD-10-CM

## 2012-05-11 DIAGNOSIS — M25551 Pain in right hip: Secondary | ICD-10-CM

## 2012-05-11 MED ORDER — MELOXICAM 7.5 MG PO TABS: 7.5000 mg | ORAL_TABLET | Freq: Every day | ORAL | Status: DC

## 2012-05-11 NOTE — Patient Instructions (Signed)
Your pain is likely coming from a low back strain or flair of some arthritis in the hip.  You can take the meloxicam once per day (do not combine with other antiinflammatory pain meds), heat or ice to area and gentle stretches. Recheck in next week if not improving. Ok to walk, but may try for less time than yesterday as this may have increased the pain.  Return to the clinic or go to the nearest emergency room if any of your symptoms worsen or new symptoms occur.

## 2012-05-11 NOTE — Progress Notes (Signed)
  Subjective:    Patient ID: BENNETT RAM, male    DOB: 04/27/1940, 72 y.o.   MRN: [id number]  HPI LYAL HUSTED is a 72 y.o. male  R lower buttock into hip pain - started after cleaning debris last Saturday. Noticed when lifting/twisting - grabbing feeling.  Had been about the same, but worse this am. Sore into side more than in hip. No hip problems or surgeries, but has had back surgery for bulging disc in 1991.    Tx: Alleve - mod relief at night. Has helped for walking for 30 minutes last night.   Review of Systems  Genitourinary: Negative for difficulty urinating.  Musculoskeletal: Positive for myalgias. Negative for back pain, joint swelling and gait problem.  Skin: Negative for rash and wound.  Neurological: Negative for weakness.   No bowel or bladder incontinence, no saddle anesthesia, no lower extremity weakness.      Objective:   Physical Exam  Vitals reviewed. Constitutional: He is oriented to person, place, and time. He appears well-developed and well-nourished. No distress.  Pulmonary/Chest: Effort normal.  Musculoskeletal:       Right hip: He exhibits tenderness. He exhibits normal range of motion, normal strength and no bony tenderness.       Legs: Neurological: He is alert and oriented to person, place, and time. He has normal strength. No sensory deficit.  Reflex Scores:      Achilles reflexes are 2+ on the right side and 2+ on the left side. Skin: Skin is warm and dry. No rash noted.  Psychiatric: He has a normal mood and affect. His behavior is normal.    [facility] reading (PRIMARY) by  Dr. Neva Seat: LS spine: decreased L5-S1 space.  R hip: no acute findings.  postsurgical changes noted in pelvis.      Assessment & Plan:  EVEREST BROD is a 72 y.o. male Right hip pain - Plan: DG Hip Complete Right  Low back pain - Plan: DG Lumbar Spine 2-3 Views  Suspected djd of spine with flair in L5 distribution vs flair of djd in R hip.  Sx care discussed with  heat, rom. wlaking ok, but would lessen amount for next week. mobic 7.5mg  qd prn, has robaxin if needed at night. Recheck next 1 week if not improving - sooner if worse.   Meds ordered this encounter  Medications  . meloxicam (MOBIC) 7.5 MG tablet    Sig: Take 1 tablet (7.5 mg total) by mouth daily.    Dispense:  30 tablet    Refill:  0    Patient Instructions  Your pain is likely coming from a low back strain or flair of some arthritis in the hip.  You can take the meloxicam once per day (do not combine with other antiinflammatory pain meds), heat or ice to area and gentle stretches. Recheck in next week if not improving. Ok to walk, but may try for less time than yesterday as this may have increased the pain.  Return to the clinic or go to the nearest emergency room if any of your symptoms worsen or new symptoms occur.

## 2012-05-24 ENCOUNTER — Telehealth: Payer: Self-pay

## 2012-05-24 NOTE — Telephone Encounter (Signed)
Pt is going today to see a specialist about his right hip and would like a copy of xrays   Pt will be leaving around 1200 to come by and get these  Best number [phone]

## 2012-05-24 NOTE — Telephone Encounter (Signed)
Request given to xray 

## 2012-08-11 ENCOUNTER — Other Ambulatory Visit: Payer: Self-pay | Admitting: Radiology

## 2012-08-14 ENCOUNTER — Ambulatory Visit: Payer: Medicare PPO | Admitting: Family Medicine

## 2012-08-21 ENCOUNTER — Ambulatory Visit (INDEPENDENT_AMBULATORY_CARE_PROVIDER_SITE_OTHER): Payer: Medicare PPO | Admitting: Family Medicine

## 2012-08-21 ENCOUNTER — Encounter: Payer: Self-pay | Admitting: Family Medicine

## 2012-08-21 VITALS — BP 124/74 | HR 72 | Temp 98.8°F | Resp 16 | Ht 68.0 in | Wt 187.2 lb

## 2012-08-21 DIAGNOSIS — Z09 Encounter for follow-up examination after completed treatment for conditions other than malignant neoplasm: Secondary | ICD-10-CM

## 2012-08-21 DIAGNOSIS — Z08 Encounter for follow-up examination after completed treatment for malignant neoplasm: Secondary | ICD-10-CM

## 2012-08-21 LAB — POCT URINALYSIS DIPSTICK
Ketones, UA: NEGATIVE
Leukocytes, UA: NEGATIVE
Protein, UA: NEGATIVE
Urobilinogen, UA: 0.2

## 2012-08-21 LAB — POCT UA - MICROSCOPIC ONLY
Bacteria, U Microscopic: NEGATIVE
Yeast, UA: NEGATIVE

## 2012-08-21 NOTE — Patient Instructions (Signed)
We will call you to schedule the physical. Return to the clinic or go to the nearest emergency room if any of your symptoms worsen or new symptoms occur.

## 2012-08-21 NOTE — Progress Notes (Signed)
Subjective:    Patient ID: Jack Haynes, male    DOB: 05/21/1940, 72 y.o.   MRN: [id number]  HPI Jack Haynes is a 72 y.o. male   Hx of radical prostatectomy for prostate cancer (limted to prostate only) in about 69 - Dr. Ronal Fear. Last PSA <0.01 10/2011.  Planning on yearly PSA.  No new urinary dysuria, hesitancy, urgency, or hematuria. No wt loss, nt sweats, or fever/chills.   Hx of L TKR 02/01/12,  Doing well with knee. No pain, no restrictions. Golfing now.    Past Medical History  Diagnosis Date  . Arthritis   . Cancer     hx of prostatae cancer   Past Surgical History  Procedure Laterality Date  . Knee surgery    . Back surgery    . Eye surgery    . Prostate surgery    . Total knee arthroplasty  02/01/2012    Procedure: TOTAL KNEE ARTHROPLASTY;  Surgeon: Eugenia Mcalpine, MD;  Location: [facility];  Service: Orthopedics;  Laterality: Left;  . Appendectomy    . Joint replacement     No Known Allergies Prior to Admission medications   Medication Sig Start Date End Date Taking? Authorizing Provider  aspirin (BAYER ASPIRIN) 325 MG tablet Take 1 tablet (325 mg total) by mouth 2 (two) times daily. 02/04/12   Jamelle Rushing, PA-C  cephALEXin (KEFLEX) 500 MG capsule Take 1 capsule (500 mg total) by mouth 4 (four) times daily. 02/04/12   Jamelle Rushing, PA-C  docusate sodium 100 MG CAPS Take 100 mg by mouth 2 (two) times daily. 02/04/12   Jamelle Rushing, PA-C  ferrous sulfate 325 (65 FE) MG tablet Take 1 tablet (325 mg total) by mouth 3 (three) times daily after meals. 02/04/12   Jamelle Rushing, PA-C  meloxicam (MOBIC) 7.5 MG tablet Take 1 tablet (7.5 mg total) by mouth daily. 05/11/12   Shade Flood, MD  methocarbamol (ROBAXIN) 500 MG tablet Take 1 tablet (500 mg total) by mouth every 6 (six) hours as needed. 02/04/12   Jamelle Rushing, PA-C  oxyCODONE (OXY IR/ROXICODONE) 5 MG immediate release tablet Take 1-2 tablets (5-10 mg total) by mouth every 3 (three) hours as needed.  02/04/12   Jamelle Rushing, PA-C   History   Social History  . Marital Status: Divorced    Spouse Name: N/A    Number of Children: N/A  . Years of Education: N/A   Occupational History  . Not on file.   Social History Main Topics  . Smoking status: Former Smoker    Quit date: 02/15/1981  . Smokeless tobacco: Current User    Types: Chew  . Alcohol Use: Yes     Comment: occasional beer or mixed drink  . Drug Use: No  . Sexually Active: No   Other Topics Concern  . Not on file   Social History Narrative  . No narrative on file    Review of Systems  Constitutional: Negative for fever, chills and unexpected weight change.  Genitourinary: Negative for dysuria, urgency, frequency, flank pain, decreased urine volume, discharge, difficulty urinating and testicular pain.  Musculoskeletal: Negative for back pain and arthralgias.   As above.     Objective:   Physical Exam  Vitals reviewed. Constitutional: He is oriented to person, place, and time.  Cardiovascular: Normal rate, regular rhythm, normal heart sounds and intact distal pulses.   Pulmonary/Chest: Effort normal and breath sounds normal.  Abdominal: Soft. Normal  appearance. He exhibits no distension. There is no tenderness. There is no rebound and no CVA tenderness.  Neurological: He is alert and oriented to person, place, and time.  Skin: Skin is warm and dry.  Psychiatric: He has a normal mood and affect. His behavior is normal.   Results for orders placed in visit on 08/21/12  POCT URINALYSIS DIPSTICK      Result Value Range   Color, UA yellow     Clarity, UA clear     Glucose, UA neg     Bilirubin, UA neg     Ketones, UA neg     Spec Grav, UA >=1.030     Blood, UA neg     pH, UA 5.5     Protein, UA neg     Urobilinogen, UA 0.2     Nitrite, UA neg     Leukocytes, UA Negative    POCT UA - MICROSCOPIC ONLY      Result Value Range   WBC, Ur, HPF, POC 0-1     RBC, urine, microscopic 0-2     Bacteria, U  Microscopic neg     Mucus, UA large     Epithelial cells, urine per micros 0-2     Crystals, Ur, HPF, POC neg     Casts, Ur, LPF, POC neg     Yeast, UA neg          Assessment & Plan:  TORRE SCHAUMBURG is a 72 y.o. male Encounter for follow-up surveillance of prostate cancer - Plan: POCT urinalysis dipstick, POCT UA - Microscopic Only, PSA  Early for PSA screening - lab only PSAorder for September, but can also schedule physical in early October - ok to wait until that OV for labs or lab only visit ok.   Patient Instructions  We will call you to schedule the physical. Return to the clinic or go to the nearest emergency room if any of your symptoms worsen or new symptoms occur.

## 2012-08-22 NOTE — Progress Notes (Signed)
LM for patient to call back regarding an appointment with Neva Seat or Guest for [facility] PE

## 2012-08-24 NOTE — Progress Notes (Signed)
Left msg for pt to schedule CPE with Dr. Neva Seat in October ( Dr. Perrin Maltese booked).

## 2012-09-04 NOTE — Progress Notes (Signed)
Sent pt reminder letter to schedule future CPE.  

## 2012-11-20 ENCOUNTER — Encounter: Payer: Self-pay | Admitting: Family Medicine

## 2012-11-20 ENCOUNTER — Ambulatory Visit (INDEPENDENT_AMBULATORY_CARE_PROVIDER_SITE_OTHER): Payer: Medicare PPO | Admitting: Family Medicine

## 2012-11-20 VITALS — BP 140/78 | HR 73 | Temp 98.7°F | Resp 16 | Ht 68.25 in | Wt 192.2 lb

## 2012-11-20 DIAGNOSIS — H9190 Unspecified hearing loss, unspecified ear: Secondary | ICD-10-CM

## 2012-11-20 DIAGNOSIS — Z23 Encounter for immunization: Secondary | ICD-10-CM

## 2012-11-20 DIAGNOSIS — Z0000 Encounter for general adult medical examination without abnormal findings: Secondary | ICD-10-CM

## 2012-11-20 DIAGNOSIS — Z139 Encounter for screening, unspecified: Secondary | ICD-10-CM

## 2012-11-20 DIAGNOSIS — Z8546 Personal history of malignant neoplasm of prostate: Secondary | ICD-10-CM

## 2012-11-20 LAB — CBC WITH DIFFERENTIAL/PLATELET
HCT: 42 % (ref 39.0–52.0)
Hemoglobin: 15.1 g/dL (ref 13.0–17.0)
Lymphocytes Relative: 26 % (ref 12–46)
MCHC: 36 g/dL (ref 30.0–36.0)
Monocytes Absolute: 0.9 10*3/uL (ref 0.1–1.0)
Monocytes Relative: 13 % — ABNORMAL HIGH (ref 3–12)
Neutro Abs: 4 10*3/uL (ref 1.7–7.7)
WBC: 7.2 10*3/uL (ref 4.0–10.5)

## 2012-11-20 LAB — COMPREHENSIVE METABOLIC PANEL
AST: 16 U/L (ref 0–37)
Albumin: 4.1 g/dL (ref 3.5–5.2)
BUN: 13 mg/dL (ref 6–23)
CO2: 26 mEq/L (ref 19–32)
Calcium: 8.8 mg/dL (ref 8.4–10.5)
Chloride: 107 mEq/L (ref 96–112)
Glucose, Bld: 104 mg/dL — ABNORMAL HIGH (ref 70–99)
Potassium: 4 mEq/L (ref 3.5–5.3)

## 2012-11-20 LAB — LIPID PANEL
Cholesterol: 192 mg/dL (ref 0–200)
HDL: 47 mg/dL (ref >39)

## 2012-11-20 NOTE — Patient Instructions (Signed)
We will refer you to an audiologist to discuss hearing testing.  Schedule colonoscopy as planned.  Return to the clinic or go to the nearest emergency room if any of your symptoms worsen or new symptoms occur. Keeping you healthy  Get these tests  Blood pressure- Have your blood pressure checked once a year by your healthcare provider.  Normal blood pressure is 120/80  Weight- Have your body mass index (BMI) calculated to screen for obesity.  BMI is a measure of body fat based on height and weight. You can also calculate your own BMI at [email or web address].  Cholesterol- Have your cholesterol checked every year.  Diabetes- Have your blood sugar checked regularly if you have high blood pressure, high cholesterol, have a family history of diabetes or if you are overweight.  Screening for Colon Cancer- Colonoscopy starting at age 23.  Screening may begin sooner depending on your family history and other health conditions. Follow up colonoscopy as directed by your Gastroenterologist.  Screening for Prostate Cancer- Both blood work (PSA) and a rectal exam help screen for Prostate Cancer.  Screening begins at age 42 with African-American men and at age 69 with Caucasian men.  Screening may begin sooner depending on your family history.  Take these medicines  Aspirin- One aspirin daily can help prevent Heart disease and Stroke.  Flu shot- Every fall.  Tetanus- Every 10 years.  Zostavax- Once after the age of 57 to prevent Shingles.  Pneumonia shot- Once after the age of 75; if you are younger than 65, ask your healthcare provider if you need a Pneumonia shot.  Take these steps  Don't smoke- If you do smoke, talk to your doctor about quitting.  For tips on how to quit, go to www.smokefree.gov or call 1-800-QUIT-NOW.  Be physically active- Exercise 5 days a week for at least 30 minutes.  If you are not already physically active start slow and gradually work up to 30 minutes of moderate  physical activity.  Examples of moderate activity include walking briskly, mowing the yard, dancing, swimming, bicycling, etc.  Eat a healthy diet- Eat a variety of healthy food such as fruits, vegetables, low fat milk, low fat cheese, yogurt, lean meant, poultry, fish, beans, tofu, etc. For more information go to www.thenutritionsource.org  Drink alcohol in moderation- Limit alcohol intake to less than two drinks a day. Never drink and drive.  Dentist- Brush and floss twice daily; visit your dentist twice a year.  Depression- Your emotional health is as important as your physical health. If you're feeling down, or losing interest in things you would normally enjoy please talk to your healthcare provider.  Eye exam- Visit your eye doctor every year.  Safe sex- If you may be exposed to a sexually transmitted infection, use a condom.  Seat belts- Seat belts can save your life; always wear one.  Smoke/Carbon Monoxide detectors- These detectors need to be installed on the appropriate level of your home.  Replace batteries at least once a year.  Skin cancer- When out in the sun, cover up and use sunscreen 15 SPF or higher.  Violence- If anyone is threatening you, please tell your healthcare provider.  Living Will/ Health care power of attorney- Speak with your healthcare provider and family.

## 2012-11-20 NOTE — Progress Notes (Signed)
  Subjective:    Patient ID: Jack Haynes, male    DOB: 01-11-1941, 72 y.o.   MRN: [id number]  HPI    Review of Systems  Constitutional: Negative.   HENT: Positive for hearing loss.   Eyes: Negative.   Respiratory: Negative.   Cardiovascular: Negative.   Gastrointestinal: Negative.   Endocrine: Negative.   Genitourinary: Negative.   Musculoskeletal: Positive for myalgias.  Skin: Negative.   Allergic/Immunologic: Negative.   Neurological: Negative.   Hematological: Negative.   Psychiatric/Behavioral: Negative.        Objective:   Physical Exam        Assessment & Plan:

## 2012-11-20 NOTE — Progress Notes (Signed)
Subjective:    Patient ID: Jack Haynes, male    DOB: December 01, 1940, 72 y.o.   MRN: [id number]  HPI Jack Haynes is a 72 y.o. male  Here for medicare physical. Colonoscopy: about 5 years ago - due - will be scheduling soon as he received letter from [facility]: see acuity today, reviewed. Optho appt in May of next year, yearly.  Lipids: last noted in 2011 with LDL 151. Fasting labs today.  PSA: as below Pneumovax:01/06/10 Zostavax: 2013 tdap 01/03/07 Flu vaccine: given today.  Depression screen: PHQ2 - 0. Denies depressed mood/anhedonia.   Hearing: audiometry 10/2011, but unavailable. notes decreased hearing at times only. Hears conversation ok unless crowd of people.  Radio and TV louder. Feels like a little less this year and would be amenable to meeting with audiologist.   Advanced directives: has roughly discussed with sons.  Full code.   Hx of radical prostatectomy for prostate cancer (limted to prostate only) in about 20 - Dr. Ronal Fear. Planning on yearly PSA.   Last PSA <0.01 on 11/01/11. No new urinary symptoms, no hematuria, no f/c, wt loss, or bone pain.   Results for orders placed in visit on 08/21/12  POCT URINALYSIS DIPSTICK      Result Value Range   Color, UA yellow     Clarity, UA clear     Glucose, UA neg     Bilirubin, UA neg     Ketones, UA neg     Spec Grav, UA >=1.030     Blood, UA neg     pH, UA 5.5     Protein, UA neg     Urobilinogen, UA 0.2     Nitrite, UA neg     Leukocytes, UA Negative    POCT UA - MICROSCOPIC ONLY      Result Value Range   WBC, Ur, HPF, POC 0-1     RBC, urine, microscopic 0-2     Bacteria, U Microscopic neg     Mucus, UA large     Epithelial cells, urine per micros 0-2     Crystals, Ur, HPF, POC neg     Casts, Ur, LPF, POC neg     Yeast, UA neg      Hx of L TKR 02/01/12.    Review of Systems 13 point review of systems per patient health survey noted.  Negative other than as indicated on reviewed nursing note, including  hearing loss and myalgias - episodic, nki.     Objective:   Physical Exam  Vitals reviewed. Constitutional: He is oriented to person, place, and time. He appears well-developed and well-nourished.  HENT:  Head: Normocephalic and atraumatic.  Right Ear: External ear normal.  Left Ear: External ear normal.  Mouth/Throat: Oropharynx is clear and moist.  Hearing grossly wnl.  Eyes: Conjunctivae and EOM are normal. Pupils are equal, round, and reactive to light.  Neck: Normal range of motion. Neck supple. No thyromegaly present.  Cardiovascular: Normal rate, regular rhythm, normal heart sounds and intact distal pulses.   Pulmonary/Chest: Effort normal and breath sounds normal. No respiratory distress. He has no wheezes.  Abdominal: Soft. He exhibits no distension. There is no tenderness. Hernia confirmed negative in the right inguinal area and confirmed negative in the left inguinal area.  Genitourinary:  No prostate palpable.   Musculoskeletal: Normal range of motion. He exhibits no edema and no tenderness.  Lymphadenopathy:    He has no cervical adenopathy.  Neurological: He is alert  and oriented to person, place, and time. He has normal reflexes.  Skin: Skin is warm and dry.  Psychiatric: He has a normal mood and affect. His behavior is normal.   POCT FOBT negative.      Assessment & Plan:  Jack Haynes is a 72 y.o. male Routine general medical examination at a health care facility - Plan: IFOBT POC (occult bld, rslt in office), Comprehensive metabolic panel, Lipid panel, PSA, Medicare, CBC with Differential.  utd on routine care as above.  Anticipatory guidance given. Labs above.   Need for prophylactic vaccination and inoculation against influenza - Plan: Flu Vaccine QUAD 36+ mos IM given.  Hearing loss, unspecified laterality - Plan: Ambulatory referral to Audiology for repeat audiometry and likely discussion of hearing aids.   History of prostate CA, s/p prostatectomy.  Check PSA.  Patient Instructions  We will refer you to an audiologist to discuss hearing testing.  Schedule colonoscopy as planned.  Return to the clinic or go to the nearest emergency room if any of your symptoms worsen or new symptoms occur. Keeping you healthy  Get these tests  Blood pressure- Have your blood pressure checked once a year by your healthcare provider.  Normal blood pressure is 120/80  Weight- Have your body mass index (BMI) calculated to screen for obesity.  BMI is a measure of body fat based on height and weight. You can also calculate your own BMI at [email or web address].  Cholesterol- Have your cholesterol checked every year.  Diabetes- Have your blood sugar checked regularly if you have high blood pressure, high cholesterol, have a family history of diabetes or if you are overweight.  Screening for Colon Cancer- Colonoscopy starting at age 18.  Screening may begin sooner depending on your family history and other health conditions. Follow up colonoscopy as directed by your Gastroenterologist.  Screening for Prostate Cancer- Both blood work (PSA) and a rectal exam help screen for Prostate Cancer.  Screening begins at age 33 with African-American men and at age 11 with Caucasian men.  Screening may begin sooner depending on your family history.  Take these medicines  Aspirin- One aspirin daily can help prevent Heart disease and Stroke.  Flu shot- Every fall.  Tetanus- Every 10 years.  Zostavax- Once after the age of 4 to prevent Shingles.  Pneumonia shot- Once after the age of 49; if you are younger than 29, ask your healthcare provider if you need a Pneumonia shot.  Take these steps  Don't smoke- If you do smoke, talk to your doctor about quitting.  For tips on how to quit, go to www.smokefree.gov or call 1-800-QUIT-NOW.  Be physically active- Exercise 5 days a week for at least 30 minutes.  If you are not already physically active start slow and  gradually work up to 30 minutes of moderate physical activity.  Examples of moderate activity include walking briskly, mowing the yard, dancing, swimming, bicycling, etc.  Eat a healthy diet- Eat a variety of healthy food such as fruits, vegetables, low fat milk, low fat cheese, yogurt, lean meant, poultry, fish, beans, tofu, etc. For more information go to www.thenutritionsource.org  Drink alcohol in moderation- Limit alcohol intake to less than two drinks a day. Never drink and drive.  Dentist- Brush and floss twice daily; visit your dentist twice a year.  Depression- Your emotional health is as important as your physical health. If you're feeling down, or losing interest in things you would normally enjoy please talk to your healthcare  provider.  Eye exam- Visit your eye doctor every year.  Safe sex- If you may be exposed to a sexually transmitted infection, use a condom.  Seat belts- Seat belts can save your life; always wear one.  Smoke/Carbon Monoxide detectors- These detectors need to be installed on the appropriate level of your home.  Replace batteries at least once a year.  Skin cancer- When out in the sun, cover up and use sunscreen 15 SPF or higher.  Violence- If anyone is threatening you, please tell your healthcare provider.  Living Will/ Health care power of attorney- Speak with your healthcare provider and family.

## 2012-11-21 LAB — PSA, MEDICARE: PSA: <0.01 ng/mL (ref <=4.00)

## 2013-11-22 ENCOUNTER — Ambulatory Visit (INDEPENDENT_AMBULATORY_CARE_PROVIDER_SITE_OTHER): Payer: Medicare PPO

## 2013-11-22 DIAGNOSIS — Z23 Encounter for immunization: Secondary | ICD-10-CM

## 2014-05-15 ENCOUNTER — Emergency Department (HOSPITAL_COMMUNITY)
Admission: EM | Admit: 2014-05-15 | Discharge: 2014-05-15 | Disposition: A | Discharge status: Home / Self Care | Payer: Medicare PPO | Attending: Emergency Medicine | Admitting: Emergency Medicine

## 2014-05-15 ENCOUNTER — Emergency Department (HOSPITAL_COMMUNITY): Payer: Medicare PPO

## 2014-05-15 ENCOUNTER — Encounter (HOSPITAL_COMMUNITY): Payer: Self-pay | Admitting: Emergency Medicine

## 2014-05-15 DIAGNOSIS — R251 Tremor, unspecified: Secondary | ICD-10-CM | POA: Insufficient documentation

## 2014-05-15 DIAGNOSIS — Z87891 Personal history of nicotine dependence: Secondary | ICD-10-CM | POA: Insufficient documentation

## 2014-05-15 DIAGNOSIS — Z791 Long term (current) use of non-steroidal anti-inflammatories (NSAID): Secondary | ICD-10-CM | POA: Diagnosis not present

## 2014-05-15 DIAGNOSIS — Y9289 Other specified places as the place of occurrence of the external cause: Secondary | ICD-10-CM | POA: Diagnosis not present

## 2014-05-15 DIAGNOSIS — Z79899 Other long term (current) drug therapy: Secondary | ICD-10-CM | POA: Insufficient documentation

## 2014-05-15 DIAGNOSIS — R000 Tachycardia, unspecified: Secondary | ICD-10-CM | POA: Diagnosis not present

## 2014-05-15 DIAGNOSIS — Z792 Long term (current) use of antibiotics: Secondary | ICD-10-CM | POA: Diagnosis not present

## 2014-05-15 DIAGNOSIS — M549 Dorsalgia, unspecified: Secondary | ICD-10-CM | POA: Insufficient documentation

## 2014-05-15 DIAGNOSIS — M1990 Unspecified osteoarthritis, unspecified site: Secondary | ICD-10-CM | POA: Diagnosis not present

## 2014-05-15 DIAGNOSIS — W240XXA Contact with lifting devices, not elsewhere classified, initial encounter: Secondary | ICD-10-CM | POA: Diagnosis not present

## 2014-05-15 DIAGNOSIS — Z8546 Personal history of malignant neoplasm of prostate: Secondary | ICD-10-CM | POA: Insufficient documentation

## 2014-05-15 DIAGNOSIS — Z7982 Long term (current) use of aspirin: Secondary | ICD-10-CM | POA: Diagnosis not present

## 2014-05-15 LAB — BASIC METABOLIC PANEL
Anion gap: 10 (ref 5–15)
BUN: 16 mg/dL (ref 6–23)
CO2: 26 mmol/L (ref 19–32)
CREATININE: 1 mg/dL (ref 0.50–1.35)
Calcium: 8.9 mg/dL (ref 8.4–10.5)
Chloride: 102 mmol/L (ref 96–112)
GFR calc non Af Amer: 73 mL/min — ABNORMAL LOW (ref >90)
GFR, EST AFRICAN AMERICAN: 84 mL/min — AB (ref >90)
GLUCOSE: 135 mg/dL — AB (ref 70–99)
POTASSIUM: 3.7 mmol/L (ref 3.5–5.1)
SODIUM: 138 mmol/L (ref 135–145)

## 2014-05-15 LAB — TROPONIN I

## 2014-05-15 LAB — CBC
HCT: 44.6 % (ref 39.0–52.0)
Hemoglobin: 15.1 g/dL (ref 13.0–17.0)
MCH: 30.9 pg (ref 26.0–34.0)
MCHC: 33.9 g/dL (ref 30.0–36.0)
MCV: 91.4 fL (ref 78.0–100.0)
Platelets: 178 10*3/uL (ref 150–400)
RBC: 4.88 MIL/uL (ref 4.22–5.81)
RDW: 12.3 % (ref 11.5–15.5)
WBC: 16 10*3/uL — ABNORMAL HIGH (ref 4.0–10.5)

## 2014-05-15 LAB — I-STAT TROPONIN, ED

## 2014-05-15 MED ORDER — IOHEXOL 350 MG/ML SOLN
100.0000 mL | Freq: Once | INTRAVENOUS | Status: AC | PRN
  Administered 2014-05-15: 100 mL via INTRAVENOUS

## 2014-05-15 NOTE — ED Provider Notes (Signed)
CSN: [id number]     Arrival date & time 05/15/14  1600 History   First MD Initiated Contact with Patient 05/15/14 1740     Chief Complaint  Patient presents with  . Shaking  . Back Pain  . finger tips numb     right hand     (Consider location/radiation/quality/duration/timing/severity/associated sxs/prior Treatment) HPI  Jack Haynes is a(n) 74 y.o. male who presents to the emergency department with chief complaint of back pain and tremor. The patient states around 3:15 today he was sitting at his desk at work when he had the sudden onset of pain between his shoulder blades, associated heightand nausea. He states that he then developed paresthesia and heat in his hands bilaterally and then began having tremors in both hands, worse on the right. Patient states that the pain and nausea lasted for approximately 15 minutes. He denies chest pain, shortness of breath or diaphoresis. He states that the first episode for about 45 minutes. He was unable to hold a can of Coke in his right hand due to such strong tremors. He denies any other focal neurologic deficits such as facial droop, difficulty with speech or swallowing, and word finding difficulties, confusion, unilateral weakness. Patient also had no gait abnormalities. He denies a history of hypertension, hypercholesterolemia. He does have a 22-pack-year smoking history. He quit in the 1980s. He also has a family history of heart disease. He denies any urinary symptoms, abdominal pain, headaches, neck pain.   Past Medical History  Diagnosis Date  . Arthritis   . Cancer     hx of prostatae cancer   Past Surgical History  Procedure Laterality Date  . Knee surgery    . Back surgery    . Eye surgery    . Prostate surgery    . Total knee arthroplasty  02/01/2012    Procedure: TOTAL KNEE ARTHROPLASTY;  Surgeon: Sydnee Cabal, MD;  Location: [facility];  Service: Orthopedics;  Laterality: Left;  . Appendectomy    . Joint replacement    .  Spine surgery     Family History  Problem Relation Age of Onset  . Cancer Sister     brain tumor  . Heart disease Brother   . Alzheimer's disease Brother   . Heart disease Sister    History  Substance Use Topics  . Smoking status: Former Smoker    Quit date: 02/15/1981  . Smokeless tobacco: Current User    Types: Chew  . Alcohol Use: Yes     Comment: occasional beer or mixed drink    Review of Systems  Ten systems reviewed and are negative for acute change, except as noted in the HPI.    Allergies  Review of patient's allergies indicates no known allergies.  Home Medications   Prior to Admission medications   Medication Sig Start Date End Date Taking? Authorizing Provider  aspirin (BAYER ASPIRIN) 325 MG tablet Take 1 tablet (325 mg total) by mouth 2 (two) times daily. Patient taking differently: Take 325 mg by mouth every 6 (six) hours as needed (sleep).  02/04/12  Yes Lorretta Harp, PA-C  cephALEXin (KEFLEX) 500 MG capsule Take 1 capsule (500 mg total) by mouth 4 (four) times daily. Patient not taking: Reported on 05/15/2014 02/04/12   Lorretta Harp, PA-C  docusate sodium 100 MG CAPS Take 100 mg by mouth 2 (two) times daily. Patient not taking: Reported on 05/15/2014 02/04/12   Lorretta Harp, PA-C  ferrous sulfate 325 (65 FE) MG  tablet Take 1 tablet (325 mg total) by mouth 3 (three) times daily after meals. Patient not taking: Reported on 05/15/2014 02/04/12   Lorretta Harp, PA-C  meloxicam (MOBIC) 7.5 MG tablet Take 1 tablet (7.5 mg total) by mouth daily. Patient not taking: Reported on 05/15/2014 05/11/12   Wendie Agreste, MD  methocarbamol (ROBAXIN) 500 MG tablet Take 1 tablet (500 mg total) by mouth every 6 (six) hours as needed. Patient not taking: Reported on 05/15/2014 02/04/12   Lorretta Harp, PA-C  naproxen sodium (ANAPROX) 220 MG tablet Take 220 mg by mouth 2 (two) times daily as needed (pain).   Yes Historical Provider, MD  oxyCODONE (OXY IR/ROXICODONE) 5 MG immediate  release tablet Take 1-2 tablets (5-10 mg total) by mouth every 3 (three) hours as needed. Patient not taking: Reported on 05/15/2014 02/04/12   Lorretta Harp, PA-C   BP 151/78 mmHg  Pulse 100  Temp(Src) 99.1 F (37.3 C) (Oral)  Resp 25  Ht 5\' 8"  (1.727 m)  SpO2 99% Physical Exam  Constitutional: He appears well-developed and well-nourished. No distress.  HENT:  Head: Normocephalic and atraumatic.  Eyes: Conjunctivae are normal. No scleral icterus.  Neck: Normal range of motion. Neck supple.  Cardiovascular: Normal rate, regular rhythm, normal heart sounds and intact distal pulses.   Pulmonary/Chest: Effort normal and breath sounds normal. No respiratory distress.  Abdominal: Soft. He exhibits no distension and no mass. There is no tenderness. There is no guarding.  Musculoskeletal: He exhibits no edema.  Neurological: He is alert.  Speech is clear and goal oriented, follows commands Major Cranial nerves without deficit, no facial droop Normal strength in upper and lower extremities bilaterally including dorsiflexion and plantar flexion, strong and equal grip strength Sensation normal to light and sharp touch Moves extremities without ataxia, coordination intact Normal finger to nose and rapid alternating movements Neg romberg, no pronator drift Normal gait Normal heel-shin and balance   Skin: Skin is warm and dry. He is not diaphoretic.  Psychiatric: His behavior is normal.  Nursing note and vitals reviewed.   ED Course  Procedures (including critical care time) Labs Review Labs Reviewed  CBC - Abnormal; Notable for the following:    WBC 16.0 (*)    All other components within normal limits  BASIC METABOLIC PANEL - Abnormal; Notable for the following:    Glucose, Bld 135 (*)    GFR calc non Af Amer 73 (*)    GFR calc Af Amer 84 (*)    All other components within normal limits  I-STAT TROPOININ, ED - Abnormal; Notable for the following:    Troponin i, poc <0.20 (*)     All other components within normal limits  TROPONIN I  TROPONIN I    Imaging Review No results found.   EKG Interpretation   Date/Time:  Wednesday May 15 2014 16:32:20 EDT Ventricular Rate:  108 PR Interval:  125 QRS Duration: 100 QT Interval:  392 QTC Calculation: 525 R Axis:   32 Text Interpretation:  Age not entered, assumed to be  74 years old for  purpose of ECG interpretation Sinus tachycardia Borderline T wave  abnormalities Prolonged QT interval Baseline wander in lead(s) V2 No  significant change since last tracing Confirmed by [facility]  MD, Loree Fee  [phone]) on 05/15/2014 5:41:15 PM      MDM   Final diagnoses:  Back pain  Tremor    Patient with Sxs concerning for possible aortic dissection vs anginal equivalent. I doubt stroke  as the patient has no neuro deficits.   Patient EKG is without signs of acute ischemia.  Negative troponin. Labs are unremarkable. We are awaiting CT angio chest for evaluation of the aorta.  Patient with negative ct chest. Does show CAD. Will repeat a troponin.  Patient With repeat troponin negative. He has a hear score of 3 making him low risk for MACE.  He is advised to follow closelsy with cardiology and will need a stress test.  Patient seen in shared visit with attending physician who agrees with POC. Discussed return precautions with the patient. I personally reviewed the imaging tests through PACS system. I have reviewed and interpreted Lab values. I reviewed available ER/hospitalization records through the EMR    [place], PA-C 05/16/14 [place], MD 05/16/14 [phone]

## 2014-05-15 NOTE — Discharge Instructions (Signed)
Your caregiver has diagnosed you as having chest pain that is not specific for one problem, but does not require admission.  You are at low risk for an acute heart condition or other serious illness. Chest pain comes from many different causes.  SEEK IMMEDIATE MEDICAL ATTENTION IF: You have severe chest pain, especially if the pain is crushing or pressure-like and spreads to the arms, back, neck, or jaw, or if you have sweating, nausea (feeling sick to your stomach), or shortness of breath. THIS IS AN EMERGENCY. Don't wait to see if the pain will go away. Get medical help at once. Call 911 or 0 (operator). DO NOT drive yourself to the hospital.  Your chest pain gets worse and does not go away with rest.  You have an attack of chest pain lasting longer than usual, despite rest and treatment with the medications your caregiver has prescribed.  You wake from sleep with chest pain or shortness of breath.  You feel dizzy or faint.  You have chest pain not typical of your usual pain for which you originally saw your caregiver. Chest Pain (Nonspecific) It is often hard to give a specific diagnosis for the cause of chest pain. There is always a chance that your pain could be related to something serious, such as a heart attack or a blood clot in the lungs. You need to follow up with your health care provider for further evaluation. CAUSES   Heartburn.  Pneumonia or bronchitis.  Anxiety or stress.  Inflammation around your heart (pericarditis) or lung (pleuritis or pleurisy).  A blood clot in the lung.  A collapsed lung (pneumothorax). It can develop suddenly on its own (spontaneous pneumothorax) or from trauma to the chest.  Shingles infection (herpes zoster virus). The chest wall is composed of bones, muscles, and cartilage. Any of these can be the source of the pain.  The bones can be bruised by injury.  The muscles or cartilage can be strained by coughing or overwork.  The cartilage can be  affected by inflammation and become sore (costochondritis). DIAGNOSIS  Lab tests or other studies may be needed to find the cause of your pain. Your health care provider may have you take a test called an ambulatory electrocardiogram (ECG). An ECG records your heartbeat patterns over a 24-hour period. You may also have other tests, such as:  Transthoracic echocardiogram (TTE). During echocardiography, sound waves are used to evaluate how blood flows through your heart.  Transesophageal echocardiogram (TEE).  Cardiac monitoring. This allows your health care provider to monitor your heart rate and rhythm in real time.  Holter monitor. This is a portable device that records your heartbeat and can help diagnose heart arrhythmias. It allows your health care provider to track your heart activity for several days, if needed.  Stress tests by exercise or by giving medicine that makes the heart beat faster. TREATMENT   Treatment depends on what may be causing your chest pain. Treatment may include:  Acid blockers for heartburn.  Anti-inflammatory medicine.  Pain medicine for inflammatory conditions.  Antibiotics if an infection is present.  You may be advised to change lifestyle habits. This includes stopping smoking and avoiding alcohol, caffeine, and chocolate.  You may be advised to keep your head raised (elevated) when sleeping. This reduces the chance of acid going backward from your stomach into your esophagus. Most of the time, nonspecific chest pain will improve within 2-3 days with rest and mild pain medicine.  HOME CARE INSTRUCTIONS  If antibiotics were prescribed, take them as directed. Finish them even if you start to feel better.  For the next few days, avoid physical activities that bring on chest pain. Continue physical activities as directed.  Do not use any tobacco products, including cigarettes, chewing tobacco, or electronic cigarettes.  Avoid drinking alcohol.  Only  take medicine as directed by your health care provider.  Follow your health care provider's suggestions for further testing if your chest pain does not go away.  Keep any follow-up appointments you made. If you do not go to an appointment, you could develop lasting (chronic) problems with pain. If there is any problem keeping an appointment, call to reschedule. SEEK MEDICAL CARE IF:   Your chest pain does not go away, even after treatment.  You have a rash with blisters on your chest.  You have a fever. SEEK IMMEDIATE MEDICAL CARE IF:   You have increased chest pain or pain that spreads to your arm, neck, jaw, back, or abdomen.  You have shortness of breath.  You have an increasing cough, or you cough up blood.  You have severe back or abdominal pain.  You feel nauseous or vomit.  You have severe weakness.  You faint.  You have chills. This is an emergency. Do not wait to see if the pain will go away. Get medical help at once. Call your local emergency services (911 in [place] Do not drive yourself to the hospital. MAKE SURE YOU:   Understand these instructions.  Will watch your condition.  Will get help right away if you are not doing well or get worse. Document Released: 11/11/2004 Document Revised: 02/06/2013 Document Reviewed: 09/07/2007 [facility] Patient Information 2015 [place], [place]. This information is not intended to replace advice given to you by your health care provider. Make sure you discuss any questions you have with your health care provider.

## 2014-05-15 NOTE — ED Notes (Signed)
Pt states that he had driven to store to pick up parts. Parts he lifted maybe weighed 35lbs. Pt states that he has pain intermittently in his back and shaking in bilat arms, with right hand finger tips slightly numb (patient rates 2/10 in numbness).

## 2014-05-15 NOTE — ED Notes (Signed)
Pt c/o intermittent tightness/pain between shoulder blades, numbness to posterior aspect of both hands, and sudden onset nausea onset this afternoon. Pt states he was doing some lifting earlier, but pain and nausea didn't start until about 30 minutes later. Pt denies personal cardiac history, but states he has 2 immediate family members who have had heart attacks. A&O x 4.

## 2014-06-19 ENCOUNTER — Ambulatory Visit: Payer: Medicare PPO | Admitting: Internal Medicine

## 2014-10-11 DIAGNOSIS — H2513 Age-related nuclear cataract, bilateral: Secondary | ICD-10-CM | POA: Diagnosis not present

## 2014-10-11 DIAGNOSIS — H40053 Ocular hypertension, bilateral: Secondary | ICD-10-CM | POA: Diagnosis not present

## 2014-10-11 DIAGNOSIS — H25013 Cortical age-related cataract, bilateral: Secondary | ICD-10-CM | POA: Diagnosis not present

## 2014-10-11 DIAGNOSIS — H35373 Puckering of macula, bilateral: Secondary | ICD-10-CM | POA: Diagnosis not present

## 2014-10-11 DIAGNOSIS — H35033 Hypertensive retinopathy, bilateral: Secondary | ICD-10-CM | POA: Diagnosis not present

## 2014-10-11 DIAGNOSIS — H11002 Unspecified pterygium of left eye: Secondary | ICD-10-CM | POA: Diagnosis not present

## 2014-11-08 DIAGNOSIS — H40013 Open angle with borderline findings, low risk, bilateral: Secondary | ICD-10-CM | POA: Diagnosis not present

## 2014-12-24 ENCOUNTER — Encounter: Payer: Self-pay | Admitting: Internal Medicine

## 2014-12-24 ENCOUNTER — Ambulatory Visit (INDEPENDENT_AMBULATORY_CARE_PROVIDER_SITE_OTHER): Payer: Medicare PPO | Admitting: Emergency Medicine

## 2014-12-24 ENCOUNTER — Encounter: Payer: Self-pay | Admitting: Emergency Medicine

## 2014-12-24 VITALS — BP 148/82 | HR 61 | Temp 97.8°F | Resp 16 | Ht 68.0 in | Wt 192.0 lb

## 2014-12-24 DIAGNOSIS — R9431 Abnormal electrocardiogram [ECG] [EKG]: Secondary | ICD-10-CM | POA: Diagnosis not present

## 2014-12-24 DIAGNOSIS — Z0000 Encounter for general adult medical examination without abnormal findings: Secondary | ICD-10-CM

## 2014-12-24 DIAGNOSIS — Z1211 Encounter for screening for malignant neoplasm of colon: Secondary | ICD-10-CM

## 2014-12-24 DIAGNOSIS — Z23 Encounter for immunization: Secondary | ICD-10-CM

## 2014-12-24 DIAGNOSIS — D229 Melanocytic nevi, unspecified: Secondary | ICD-10-CM | POA: Diagnosis not present

## 2014-12-24 DIAGNOSIS — Z8546 Personal history of malignant neoplasm of prostate: Secondary | ICD-10-CM

## 2014-12-24 DIAGNOSIS — Z13220 Encounter for screening for lipoid disorders: Secondary | ICD-10-CM | POA: Diagnosis not present

## 2014-12-24 LAB — COMPLETE METABOLIC PANEL WITH GFR
ALT: 15 U/L (ref 9–46)
AST: 15 U/L (ref 10–35)
Albumin: 4.1 g/dL (ref 3.6–5.1)
Alkaline Phosphatase: 67 U/L (ref 40–115)
BUN: 18 mg/dL (ref 7–25)
CHLORIDE: 104 mmol/L (ref 98–110)
CO2: 25 mmol/L (ref 20–31)
CREATININE: 1.01 mg/dL (ref 0.70–1.18)
Calcium: 8.6 mg/dL (ref 8.6–10.3)
GFR, Est African American: 85 mL/min (ref >=60)
GFR, Est Non African American: 73 mL/min (ref >=60)
GLUCOSE: 106 mg/dL — AB (ref 65–99)
Potassium: 4.5 mmol/L (ref 3.5–5.3)
SODIUM: 140 mmol/L (ref 135–146)
Total Bilirubin: 0.5 mg/dL (ref 0.2–1.2)
Total Protein: 6.7 g/dL (ref 6.1–8.1)

## 2014-12-24 LAB — POCT URINALYSIS DIP (MANUAL ENTRY)
BILIRUBIN UA: NEGATIVE
GLUCOSE UA: NEGATIVE
Ketones, POC UA: NEGATIVE
LEUKOCYTES UA: NEGATIVE
NITRITE UA: NEGATIVE
Protein Ur, POC: NEGATIVE
RBC UA: NEGATIVE
Spec Grav, UA: >=1.03
UROBILINOGEN UA: 0.2
pH, UA: 5.5

## 2014-12-24 LAB — POC MICROSCOPIC URINALYSIS (UMFC): MUCUS RE: ABSENT

## 2014-12-24 LAB — CBC WITH DIFFERENTIAL/PLATELET
BASOS ABS: 0.1 10*3/uL (ref 0.0–0.1)
Basophils Relative: 1 % (ref 0–1)
EOS PCT: 5 % (ref 0–5)
Eosinophils Absolute: 0.4 10*3/uL (ref 0.0–0.7)
HEMATOCRIT: 45.7 % (ref 39.0–52.0)
Hemoglobin: 16.7 g/dL (ref 13.0–17.0)
LYMPHS ABS: 2.1 10*3/uL (ref 0.7–4.0)
Lymphocytes Relative: 29 % (ref 12–46)
MCH: 32.1 pg (ref 26.0–34.0)
MCHC: 36.5 g/dL — ABNORMAL HIGH (ref 30.0–36.0)
MCV: 87.7 fL (ref 78.0–100.0)
MPV: 9.8 fL (ref 8.6–12.4)
Monocytes Absolute: 0.8 10*3/uL (ref 0.1–1.0)
Monocytes Relative: 11 % (ref 3–12)
Neutro Abs: 4 10*3/uL (ref 1.7–7.7)
Neutrophils Relative %: 54 % (ref 43–77)
Platelets: 210 10*3/uL (ref 150–400)
RBC: 5.21 MIL/uL (ref 4.22–5.81)
RDW: 13.1 % (ref 11.5–15.5)
WBC: 7.4 10*3/uL (ref 4.0–10.5)

## 2014-12-24 NOTE — Patient Instructions (Signed)

## 2014-12-24 NOTE — Progress Notes (Addendum)
Patient ID: Jack Haynes, male   DOB: 01-16-1941, 74 y.o.   MRN: [id number]     This chart was scribed for Jack Queen, MD by Zola Button, Medical Scribe. This patient was seen in room 22 and the patient's care was started at 8:40 AM.   Chief Complaint:  Chief Complaint  Patient presents with  . Annual Exam    HPI: [name] is a 74 y.o. male who reports to [facility] today for a complete physical exam. Patient is not on any regular medications, but does take Aleve. He does not have any concerns today. He had a total left knee arthroplasty done 3 years ago by Dr. Theda Sers, which he has done well with.  Immunizations: Patient has had the flu shot this year. He has had the pneumovax, but has not had the Prevnar. He is amenable to having the Prevnar vaccine.  Cancer screening: He estimates his last colonoscopy was about 6-8 years ago, done at [place] but does not remember who did his colonoscopy. He has a history of prostate cancer, for which he had a prostate surgery about 20 years ago. Patient was told he did not need to be followed by urology.  Eye care: Patient sees Dr. Herbert Deaner for eye care. He has an old injury to his left eye caused by a drill bit about 50 years ago.  He plans to visit family, rest, and eat over the holidays.  Past Medical History  Diagnosis Date  . Arthritis   . Cancer ([place])     hx of prostatae cancer   Past Surgical History  Procedure Laterality Date  . Knee surgery    . Back surgery    . Eye surgery    . Prostate surgery    . Total knee arthroplasty  02/01/2012    Procedure: TOTAL KNEE ARTHROPLASTY;  Surgeon: Sydnee Cabal, MD;  Location: [facility];  Service: Orthopedics;  Laterality: Left;  . Appendectomy    . Joint replacement    . Spine surgery     Social History   Social History  . Marital Status: Divorced    Spouse Name: N/A  . Number of Children: N/A  . Years of Education: N/A   Social History Main Topics  . Smoking status: Former Smoker   Quit date: 02/15/1981  . Smokeless tobacco: Current User    Types: Chew  . Alcohol Use: Yes     Comment: occasional beer or mixed drink  . Drug Use: No  . Sexual Activity: No   Other Topics Concern  . None   Social History Narrative   Family History  Problem Relation Age of Onset  . Cancer Sister     brain tumor  . Heart disease Brother   . Alzheimer's disease Brother   . Heart disease Sister   . Alzheimer's disease Father   . Hypertension Sister    No Known Allergies Prior to Admission medications   Medication Sig Start Date End Date Taking? Authorizing Provider  naproxen sodium (ANAPROX) 220 MG tablet Take 220 mg by mouth 2 (two) times daily with a meal.   Yes Historical Provider, MD     ROS: The patient denies fevers, chills, night sweats, unintentional weight loss, chest pain, palpitations, wheezing, dyspnea on exertion, nausea, vomiting, abdominal pain, dysuria, hematuria, melena, numbness, weakness, or tingling.   All other systems have been reviewed and were otherwise negative with the exception of those mentioned in the HPI and as above.  PHYSICAL EXAM: Filed Vitals:   12/24/14 0820  BP: 148/82  Pulse: 61  Temp: 97.8 F (36.6 C)  Resp: 16   Body mass index is 29.2 kg/(m^2).   General: Alert, no acute distress HEENT:  Normocephalic, atraumatic, oropharynx patent. Eye: [facility] Cardiovascular:  Regular rate and rhythm, no rubs murmurs or gallops.  No Carotid bruits, radial pulse intact. No pedal edema.  Respiratory: Clear to auscultation bilaterally.  No wheezes, rales, or rhonchi.  No cyanosis, no use of accessory musculature Abdominal: No organomegaly, abdomen is soft and non-tender, positive bowel sounds.  Large scar lower abdomen with a small umbilical hernia Musculoskeletal: Gait intact. No edema, tenderness Skin: 3/4 cm pigmented nevus over the right shoulder and right anterior chest. Neurologic: Facial musculature symmetric. Psychiatric:  Patient acts appropriately throughout our interaction. Lymphatic: No cervical or submandibular lymphadenopathy Genitourinary: Prostate surgically absent.   LABS: Results for orders placed or performed in visit on 12/24/14  POCT urinalysis dipstick  Result Value Ref Range   Color, UA yellow yellow   Clarity, UA clear clear   Glucose, UA negative negative   Bilirubin, UA negative negative   Ketones, POC UA negative negative   Spec Grav, UA >=1.030    Blood, UA negative negative   pH, UA 5.5    Protein Ur, POC negative negative   Urobilinogen, UA 0.2    Nitrite, UA Negative Negative   Leukocytes, UA Negative Negative  POCT Microscopic Urinalysis ([facility])  Result Value Ref Range   WBC,UR,HPF,POC None None WBC/hpf   RBC,UR,HPF,POC None None RBC/hpf   Bacteria None None, Too numerous to count   Mucus Absent Absent   Epithelial Cells, UR Per Microscopy None None, Too numerous to count cells/hpf     EKG/XRAY:   Primary read interpreted by Dr. Everlene Farrier at [facility]. EKG shows flat T waves otherwise unremarkable.   ASSESSMENT/PLAN: 1. Annual physical exam  - POCT urinalysis dipstick - EKG 12-Lead  2. Need for immunization against influenza  - Flu Vaccine QUAD 36+ mos IM (Fluarix)  3. Screening cholesterol level Lipid panel done  4. H/O prostate cancer Prostate evaluation unremarkable no palpable prostate. - CBC with Differential/Platelet - PSA, Medicare - COMPLETE METABOLIC PANEL WITH GFR - POCT urinalysis dipstick - POCT Microscopic Urinalysis ([facility])  5. Nevus  - Ambulatory referral to Dermatology  6. Need for prophylactic vaccination against Streptococcus pneumoniae (pneumococcus)  - Pneumococcal conjugate vaccine 13-valent IM  7. Screening for colon cancer  - Ambulatory referral to Gastroenterology  8. Nonspecific abnormal electrocardiogram (ECG) (EKG)  - Ambulatory referral to Cardiology   I personally performed the services described in this documentation,  which was scribed in my presence. The recorded information has been reviewed and is accurate.  Jack Queen, MD  [facility]  12/24/2014 9:20 AM  By signing my name below, I, Zola Button, attest that this documentation has been prepared under the direction and in the presence of Jack Queen, MD.  Electronically Signed: Zola Button, Medical Scribe. 12/24/2014. 8:40 AM.   Johney [place] sideeffects, risk and benefits, and alternatives of medications d/w patient. Patient is aware that all medications have potential sideeffects and we are unable to predict every sideeffect or drug-drug interaction that may occur.  Jack Queen MD 12/24/2014 8:40 AM

## 2014-12-25 LAB — PSA, MEDICARE: PSA: <0.01 ng/mL (ref <=4.00)

## 2015-01-02 ENCOUNTER — Encounter: Payer: Self-pay | Admitting: Internal Medicine

## 2015-01-02 ENCOUNTER — Ambulatory Visit (INDEPENDENT_AMBULATORY_CARE_PROVIDER_SITE_OTHER): Payer: Medicare PPO | Admitting: Internal Medicine

## 2015-01-02 VITALS — BP 134/96 | HR 66 | Ht 68.0 in | Wt 194.8 lb

## 2015-01-02 DIAGNOSIS — Z8249 Family history of ischemic heart disease and other diseases of the circulatory system: Secondary | ICD-10-CM

## 2015-01-02 DIAGNOSIS — R9431 Abnormal electrocardiogram [ECG] [EKG]: Secondary | ICD-10-CM | POA: Diagnosis not present

## 2015-01-02 NOTE — Progress Notes (Signed)
OFFICE NOTE  Chief Complaint:  Abnormal EKG  Primary Care Physician: Jenny Reichmann, MD  HPI:  Jack Haynes is a pleasant 74 year old male is currently referred to me for abnormal EKG. He recently had a complete physical and his primary care provider's office. An EKG was performed which is notably different than an EKG he had previously year ago. This demonstrated incomplete right bundle branch block with poor R-wave progression anteriorly. An anterior MI could not be ruled out. Mr. Mohs does not recall any chest pain episodes concerning for MI. He has few if any risk factors for coronary disease other than age and family history with his brother who had an MI at age 8 and a sister who had some coronary artery disease in her 35s. He does not have a history of hypertension, abnormal cholesterol or diabetes. He is a remote smoker but has not smoked in over 30 years. He remains physically active and denies any shortness of breath with exertion. 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  PMHx:  Past Medical History  Diagnosis Date  . Arthritis   . Cancer ([place])     hx of prostatae cancer    Past Surgical History  Procedure Laterality Date  . Knee surgery    . Back surgery    . Eye surgery    . Prostate surgery    . Total knee arthroplasty  02/01/2012    Procedure: TOTAL KNEE ARTHROPLASTY;  Surgeon: Sydnee Cabal, MD;  Location: [facility];  Service: Orthopedics;  Laterality: Left;  . Appendectomy    . Joint replacement    . Spine surgery      FAMHx:  Family History  Problem Relation Age of Onset  . Cancer Sister     brain tumor  . Heart disease Brother   . Alzheimer's disease Brother   . Heart disease Sister   . Alzheimer's disease Father   . Hypertension Sister     SOCHx:   reports that he quit smoking about 33 years ago. His smokeless tobacco use includes Chew. He reports that he drinks alcohol. He reports that he does not use illicit drugs.  ALLERGIES:    No Known Allergies  ROS: A comprehensive review of systems was negative.  HOME MEDS: Current Outpatient Prescriptions  Medication Sig Dispense Refill  . carboxymethylcellulose (REFRESH PLUS) 0.5 % SOLN Place 1 drop into the left eye daily.    . naproxen sodium (ANAPROX) 220 MG tablet Take 220 mg by mouth 2 (two) times daily with a meal.     No current facility-administered medications for this visit.    LABS/IMAGING: No results found for this or any previous visit (from the past 48 hour(s)). No results found.  WEIGHTS: Wt Readings from Last 3 Encounters:  01/02/15 194 lb 12.8 oz (88.361 kg)  12/24/14 192 lb (87.091 kg)  11/20/12 192 lb 3.2 oz (87.181 kg)    VITALS: BP 134/96 mmHg  Pulse 66  Ht '5\' 8"'$  (1.727 m)  Wt 194 lb 12.8 oz (88.361 kg)  BMI 29.63 kg/m2  EXAM: General appearance: alert and no distress Neck: no carotid bruit and no JVD Lungs: clear to auscultation bilaterally Heart: regular rate and rhythm, S1, S2 normal, no murmur, click, rub or gallop Abdomen: soft, non-tender; bowel sounds normal; no masses,  no organomegaly Extremities: extremities normal, atraumatic, no cyanosis or edema Pulses: 2+ and symmetric Skin: Skin color, texture, turgor normal. No rashes or lesions Neurologic: Grossly  normal Psych: Pleasant  EKG: Normal sinus rhythm at 66, incomplete right bundle branch block, poor R-wave progression/cannot rule out anterior MI  ASSESSMENT: 1. Abnormal EKG, change from a prior EKG 1 year ago 2. Family history of coronary disease in brother and sister in her 26s and 31s  PLAN: 1.   Mr. Brandl has few if any medical problems and does not currently take medications. He is followed closely by a primary care doctor and gets regular medical checkups including cholesterol tests and lab work. This is quite reassuring, although the EKG changes are new for him. He is asymptomatic from best I can tell, however it would be reasonable to consider treadmill  exercise stress testing. This may be difficult as he's had knee replacement but he's willing to give it a try. If he cannot reach targets on the treadmill, we may need to consider pharmacologic nuclear stress testing or a coronary CT angiogram.  Thanks for the kind referral.  Pixie Casino, MD, [facility] Attending Cardiologist [place] 01/02/2015, 8:45 AM

## 2015-01-02 NOTE — Patient Instructions (Addendum)
Your physician has requested that you have an exercise tolerance test. For further information please visit [email or web address]. Please also follow instruction sheet, as given.    Your physician recommends that you schedule a follow-up appointment with Dr. Debara Pickett - after your test.

## 2015-01-30 ENCOUNTER — Telehealth (HOSPITAL_COMMUNITY): Payer: Self-pay

## 2015-01-30 NOTE — Telephone Encounter (Signed)
Encounter complete. 

## 2015-02-04 ENCOUNTER — Ambulatory Visit (HOSPITAL_COMMUNITY)
Admission: RE | Admit: 2015-02-04 | Discharge: 2015-02-04 | Disposition: A | Discharge status: Home / Self Care | Payer: Medicare PPO | Source: Ambulatory Visit | Attending: Cardiology | Admitting: Cardiology

## 2015-02-04 DIAGNOSIS — R9431 Abnormal electrocardiogram [ECG] [EKG]: Secondary | ICD-10-CM | POA: Diagnosis not present

## 2015-02-04 DIAGNOSIS — Z8249 Family history of ischemic heart disease and other diseases of the circulatory system: Secondary | ICD-10-CM | POA: Diagnosis not present

## 2015-02-04 LAB — EXERCISE TOLERANCE TEST
CHL CUP RESTING HR STRESS: 67 {beats}/min
Estimated workload: 9.8 METS
Exercise duration (min): 7 min
Exercise duration (sec): 48 s
MPHR: 146 {beats}/min
Peak HR: 125 {beats}/min
Percent HR: 85 %
RPE: 17

## 2015-02-12 ENCOUNTER — Ambulatory Visit (AMBULATORY_SURGERY_CENTER): Payer: Self-pay

## 2015-02-12 VITALS — Ht 68.0 in | Wt 198.2 lb

## 2015-02-12 DIAGNOSIS — Z1211 Encounter for screening for malignant neoplasm of colon: Secondary | ICD-10-CM

## 2015-02-12 NOTE — Progress Notes (Signed)
No allergies to eggs or soy No diet/weight loss meds No home oxygen No past problems with anesthesia  Has email and internet; refused emmi 

## 2015-02-25 ENCOUNTER — Telehealth: Payer: Self-pay | Admitting: Internal Medicine

## 2015-02-25 NOTE — Telephone Encounter (Signed)
No charge. 

## 2015-02-26 ENCOUNTER — Ambulatory Visit: Payer: Medicare PPO | Admitting: Internal Medicine

## 2015-02-26 ENCOUNTER — Encounter: Payer: Medicare PPO | Admitting: Internal Medicine

## 2015-02-28 ENCOUNTER — Encounter: Payer: Self-pay | Admitting: Internal Medicine

## 2015-02-28 ENCOUNTER — Ambulatory Visit (INDEPENDENT_AMBULATORY_CARE_PROVIDER_SITE_OTHER): Payer: Medicare PPO | Admitting: Internal Medicine

## 2015-02-28 VITALS — BP 142/72 | HR 80 | Ht 68.0 in | Wt 196.1 lb

## 2015-02-28 DIAGNOSIS — R9431 Abnormal electrocardiogram [ECG] [EKG]: Secondary | ICD-10-CM | POA: Diagnosis not present

## 2015-02-28 NOTE — Patient Instructions (Signed)
Follow up as needed

## 2015-02-28 NOTE — Progress Notes (Signed)
OFFICE NOTE  Chief Complaint:  Abnormal EKG  Primary Care Physician: Jenny Reichmann, MD  HPI:  Jack Haynes is a pleasant 75 year old male is currently referred to me for abnormal EKG. He recently had a complete physical and his primary care provider's office. An EKG was performed which is notably different than an EKG he had previously year ago. This demonstrated incomplete right bundle branch block with poor R-wave progression anteriorly. An anterior MI could not be ruled out. Jack Haynes does not recall any chest pain episodes concerning for MI. He has few if any risk factors for coronary disease other than age and family history with his brother who had an MI at age 13 and a sister who had some coronary artery disease in her 74s. He does not have a history of hypertension, abnormal cholesterol or diabetes. He is a remote smoker but has not smoked in over 30 years. He remains physically active and denies any shortness of breath with exertion. 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  Jack Haynes returns today for follow-up of his stress test. He underwent a Bruce treadmill stress test and exercised for 7 minutes and 45 seconds. He had no chest pain and there were no ischemic EKG changes. He continues to deny any chest pain or worsening shortness of breath.  PMHx:  Past Medical History  Diagnosis Date  . Arthritis   . Cancer ([place])     hx of prostatae cancer    Past Surgical History  Procedure Laterality Date  . Knee surgery    . Back surgery  1998  . Eye surgery Left 1965  . Prostate surgery    . Total knee arthroplasty  02/01/2012    Procedure: TOTAL KNEE ARTHROPLASTY;  Surgeon: Sydnee Cabal, MD;  Location: [facility];  Service: Orthopedics;  Laterality: Left;  . Appendectomy    . Joint replacement Left 2010    left knee replacement  . Spine surgery      FAMHx:  Family History  Problem Relation Age of Onset  . Cancer Sister     brain tumor  . Heart disease  Brother   . Alzheimer's disease Brother   . Heart disease Sister   . Alzheimer's disease Father   . Hypertension Sister     SOCHx:   reports that he quit smoking about 34 years ago. His smokeless tobacco use includes Chew. He reports that he drinks alcohol. He reports that he does not use illicit drugs.  ALLERGIES:  No Known Allergies  ROS: A comprehensive review of systems was negative.  HOME MEDS: Current Outpatient Prescriptions  Medication Sig Dispense Refill  . carboxymethylcellulose (REFRESH PLUS) 0.5 % SOLN Place 1 drop into the left eye daily.    . naproxen sodium (ANAPROX) 220 MG tablet Take 220 mg by mouth as needed.     Marland Kitchen amoxicillin (AMOXIL) 500 MG capsule Take 4 capsules by mouth as needed. One hour prior to dental work  99   No current facility-administered medications for this visit.    LABS/IMAGING: No results found for this or any previous visit (from the past 48 hour(s)). No results found.  WEIGHTS: Wt Readings from Last 3 Encounters:  02/28/15 196 lb 1.6 oz (88.95 kg)  02/12/15 198 lb 3.2 oz (89.903 kg)  01/02/15 194 lb 12.8 oz (88.361 kg)    VITALS: BP 142/72 mmHg  Pulse 80  Ht '5\' 8"'$  (1.727 m)  Wt 196 lb 1.6 oz (  88.95 kg)  BMI 29.82 kg/m2  EXAM: Deferred  EKG: Deferred  ASSESSMENT: 1. Abnormal EKG - new iRBBB (low risk GXT) 2. Family history of coronary disease in brother and sister in her 10s and 90s  PLAN: 65.   Jack Haynes continues to be asymptomatic. He underwent a Bruce treadmill stress test which was negative for ischemia. Overall this is low risk. I suspect this is some progression of conduction system disease which may not be related to coronary disease causing his incomplete right bundle branch pattern. Nonetheless, he is asymptomatic with this and I'm encouraged him to continue to work on exercise, healthy eating and lifestyle modifications. No further workup is necessary at this time.  Follow-up as needed.  Pixie Casino, MD,  [facility] Attending Cardiologist [place] [facility] Levana Minetti 02/28/2015, 8:20 AM

## 2015-03-11 ENCOUNTER — Encounter: Payer: Self-pay | Admitting: Internal Medicine

## 2015-03-11 ENCOUNTER — Ambulatory Visit (AMBULATORY_SURGERY_CENTER): Payer: Medicare PPO | Admitting: Internal Medicine

## 2015-03-11 VITALS — BP 151/100 | HR 67 | Temp 97.4°F | Resp 24 | Ht 68.0 in | Wt 196.0 lb

## 2015-03-11 DIAGNOSIS — Z1211 Encounter for screening for malignant neoplasm of colon: Secondary | ICD-10-CM | POA: Diagnosis not present

## 2015-03-11 MED ORDER — SODIUM CHLORIDE 0.9 % IV SOLN: 500.0000 mL | INTRAVENOUS | Status: DC

## 2015-03-11 NOTE — Patient Instructions (Addendum)
No polyps or cancer seen. You do have a condition called diverticulosis - common and not usually a problem. Please read the handout provided.  Since you do not have polyps and are 53 you will not need routine repeat colon cancer screening.  I appreciate the opportunity to care for you. Gatha Mayer, MD, [facility]   Discharge instructions given. Handout on diverticulosis. Resume previous medications. YOU HAD AN ENDOSCOPIC PROCEDURE TODAY AT [facility]:   Refer to the procedure report that was given to you for any specific questions about what was found during the examination.  If the procedure report does not answer your questions, please call your gastroenterologist to clarify.  If you requested that your care partner not be given the details of your procedure findings, then the procedure report has been included in a sealed envelope for you to review at your convenience later.  YOU SHOULD EXPECT: Some feelings of bloating in the abdomen. Passage of more gas than usual.  Walking can help get rid of the air that was put into your GI tract during the procedure and reduce the bloating. If you had a lower endoscopy (such as a colonoscopy or flexible sigmoidoscopy) you may notice spotting of blood in your stool or on the toilet paper. If you underwent a bowel prep for your procedure, you may not have a normal bowel movement for a few days.  Please Note:  You might notice some irritation and congestion in your nose or some drainage.  This is from the oxygen used during your procedure.  There is no need for concern and it should clear up in a day or so.  SYMPTOMS TO REPORT IMMEDIATELY:   Following lower endoscopy (colonoscopy or flexible sigmoidoscopy):  Excessive amounts of blood in the stool  Significant tenderness or worsening of abdominal pains  Swelling of the abdomen that is new, acute  Fever of 100F or higher   For urgent or emergent issues, a gastroenterologist  can be reached at any hour by calling [phone].   DIET: Your first meal following the procedure should be a small meal and then it is ok to progress to your normal diet. Heavy or fried foods are harder to digest and may make you feel nauseous or bloated.  Likewise, meals heavy in dairy and vegetables can increase bloating.  Drink plenty of fluids but you should avoid alcoholic beverages for 24 hours.  ACTIVITY:  You should plan to take it easy for the rest of today and you should NOT DRIVE or use heavy machinery until tomorrow (because of the sedation medicines used during the test).    FOLLOW UP: Our staff will call the number listed on your records the next business day following your procedure to check on you and address any questions or concerns that you may have regarding the information given to you following your procedure. If we do not reach you, we will leave a message.  However, if you are feeling well and you are not experiencing any problems, there is no need to return our call.  We will assume that you have returned to your regular daily activities without incident.  If any biopsies were taken you will be contacted by phone or by letter within the next 1-3 weeks.  Please call us at [phone] if you have not heard about the biopsies in 3 weeks.    SIGNATURES/CONFIDENTIALITY: You and/or your care partner have signed paperwork which will be entered  into your electronic medical record.  These signatures attest to the fact that that the information above on your After Visit Summary has been reviewed and is understood.  Full responsibility of the confidentiality of this discharge information lies with you and/or your care-partner.

## 2015-03-11 NOTE — Progress Notes (Signed)
Report to PACU, RN, vss, BBS= Clear.  

## 2015-03-11 NOTE — Op Note (Signed)
[place]  [address] [place], [id number]   COLONOSCOPY PROCEDURE REPORT  PATIENT: Jack, Haynes  MR#: [id number] BIRTHDATE: 09-07-1940 , 74  yrs. old GENDER: male ENDOSCOPIST: Gatha Mayer, MD, [facility] PROCEDURE DATE:  03/11/2015 PROCEDURE:   Colonoscopy, screening First Screening Colonoscopy - Avg.  risk and is 50 yrs.  old or older - No.  Prior Negative Screening - Now for repeat screening. 10 or more years since last screening  History of Adenoma - Now for follow-up colonoscopy & has been > or = to 3 yrs.  N/A  Polyps removed today? No Recommend repeat exam, <10 yrs? No ASA CLASS:   Class II INDICATIONS:Screening for colonic neoplasia and Colorectal Neoplasm Risk Assessment for this procedure is average risk. MEDICATIONS: Propofol 200 mg IV and Monitored anesthesia care  DESCRIPTION OF PROCEDURE:   After the risks benefits and alternatives of the procedure were thoroughly explained, informed consent was obtained.  The digital rectal exam revealed no rectal mass and revealed a surgically absent prostate.   The LB [id number]  endoscope was introduced through the anus and advanced to the cecum, which was identified by both the appendix and ileocecal valve. No adverse events experienced.   The quality of the prep was good.  (MiraLax was used)  The instrument was then slowly withdrawn as the colon was fully examined. Estimated blood loss is zero unless otherwise noted in this procedure report.      COLON FINDINGS: There was severe diverticulosis noted in the left colon.   The examination was otherwise normal.  Retroflexed views revealed no abnormalities. The time to cecum = 1.3 Withdrawal time = 9.5   The scope was withdrawn and the procedure completed. COMPLICATIONS: There were no immediate complications.  ENDOSCOPIC IMPRESSION: 1.   Severe diverticulosis was noted in the left colon 2.   The examination was otherwise  normal  RECOMMENDATIONS: Routine repeat colonoscopy screening not necessary.  See me/GI as needed.  eSigned:  Gatha Mayer, MD, [facility] 03/11/2015 10:02 AM   cc: Dr. Nena Jordan and The Patient

## 2015-03-11 NOTE — Progress Notes (Signed)
Patient given levsin to aide in expelling of air with moderate amount of air expelled. Abdominal firm with moderate amount of air. Instructed to drink warm liquids and ambulate while home to help. Patient comfortable with going home. Felt better.

## 2015-03-12 ENCOUNTER — Telehealth: Payer: Self-pay | Admitting: *Deleted

## 2015-03-12 NOTE — Telephone Encounter (Signed)
  Follow up Call-  Call back number 03/11/2015  Post procedure Call Back phone  # [phone]  Permission to leave phone message Yes     Patient questions:  Do you have a fever, pain , or abdominal swelling? No. Pain Score  0 *  Have you tolerated food without any problems? Yes.    Have you been able to return to your normal activities? Yes.    Do you have any questions about your discharge instructions: Diet   No. Medications  No. Follow up visit  No.  Do you have questions or concerns about your Care? No.  Actions: * If pain score is 4 or above: No action needed, pain <4.

## 2015-04-01 DIAGNOSIS — Z85828 Personal history of other malignant neoplasm of skin: Secondary | ICD-10-CM | POA: Diagnosis not present

## 2015-04-01 DIAGNOSIS — C44319 Basal cell carcinoma of skin of other parts of face: Secondary | ICD-10-CM | POA: Diagnosis not present

## 2015-04-01 DIAGNOSIS — D485 Neoplasm of uncertain behavior of skin: Secondary | ICD-10-CM | POA: Diagnosis not present

## 2015-04-01 DIAGNOSIS — D225 Melanocytic nevi of trunk: Secondary | ICD-10-CM | POA: Diagnosis not present

## 2015-04-01 DIAGNOSIS — L821 Other seborrheic keratosis: Secondary | ICD-10-CM | POA: Diagnosis not present

## 2015-04-01 DIAGNOSIS — L570 Actinic keratosis: Secondary | ICD-10-CM | POA: Diagnosis not present

## 2015-04-01 DIAGNOSIS — C4441 Basal cell carcinoma of skin of scalp and neck: Secondary | ICD-10-CM | POA: Diagnosis not present

## 2015-04-01 DIAGNOSIS — D1801 Hemangioma of skin and subcutaneous tissue: Secondary | ICD-10-CM | POA: Diagnosis not present

## 2015-04-30 DIAGNOSIS — H11002 Unspecified pterygium of left eye: Secondary | ICD-10-CM | POA: Diagnosis not present

## 2015-04-30 DIAGNOSIS — H40013 Open angle with borderline findings, low risk, bilateral: Secondary | ICD-10-CM | POA: Diagnosis not present

## 2015-06-03 DIAGNOSIS — C44319 Basal cell carcinoma of skin of other parts of face: Secondary | ICD-10-CM | POA: Diagnosis not present

## 2015-06-03 DIAGNOSIS — C4441 Basal cell carcinoma of skin of scalp and neck: Secondary | ICD-10-CM | POA: Diagnosis not present

## 2015-07-15 DIAGNOSIS — C44319 Basal cell carcinoma of skin of other parts of face: Secondary | ICD-10-CM | POA: Diagnosis not present

## 2015-08-12 ENCOUNTER — Encounter: Payer: Self-pay | Admitting: Emergency Medicine

## 2015-09-18 DIAGNOSIS — Z6829 Body mass index (BMI) 29.0-29.9, adult: Secondary | ICD-10-CM | POA: Diagnosis not present

## 2015-09-18 DIAGNOSIS — Z8546 Personal history of malignant neoplasm of prostate: Secondary | ICD-10-CM | POA: Diagnosis not present

## 2015-09-18 DIAGNOSIS — Z96653 Presence of artificial knee joint, bilateral: Secondary | ICD-10-CM | POA: Diagnosis not present

## 2015-09-18 DIAGNOSIS — M1990 Unspecified osteoarthritis, unspecified site: Secondary | ICD-10-CM | POA: Diagnosis not present

## 2015-09-18 DIAGNOSIS — E663 Overweight: Secondary | ICD-10-CM | POA: Diagnosis not present

## 2015-09-18 DIAGNOSIS — Z85828 Personal history of other malignant neoplasm of skin: Secondary | ICD-10-CM | POA: Diagnosis not present

## 2015-09-30 DIAGNOSIS — D1801 Hemangioma of skin and subcutaneous tissue: Secondary | ICD-10-CM | POA: Diagnosis not present

## 2015-09-30 DIAGNOSIS — L821 Other seborrheic keratosis: Secondary | ICD-10-CM | POA: Diagnosis not present

## 2015-09-30 DIAGNOSIS — Z85828 Personal history of other malignant neoplasm of skin: Secondary | ICD-10-CM | POA: Diagnosis not present

## 2015-09-30 DIAGNOSIS — D485 Neoplasm of uncertain behavior of skin: Secondary | ICD-10-CM | POA: Diagnosis not present

## 2015-09-30 DIAGNOSIS — L814 Other melanin hyperpigmentation: Secondary | ICD-10-CM | POA: Diagnosis not present

## 2015-09-30 DIAGNOSIS — D225 Melanocytic nevi of trunk: Secondary | ICD-10-CM | POA: Diagnosis not present

## 2015-10-01 DIAGNOSIS — C4441 Basal cell carcinoma of skin of scalp and neck: Secondary | ICD-10-CM | POA: Diagnosis not present

## 2015-11-10 DIAGNOSIS — H35373 Puckering of macula, bilateral: Secondary | ICD-10-CM | POA: Diagnosis not present

## 2015-11-10 DIAGNOSIS — H2513 Age-related nuclear cataract, bilateral: Secondary | ICD-10-CM | POA: Diagnosis not present

## 2015-11-10 DIAGNOSIS — H25013 Cortical age-related cataract, bilateral: Secondary | ICD-10-CM | POA: Diagnosis not present

## 2015-11-10 DIAGNOSIS — H40013 Open angle with borderline findings, low risk, bilateral: Secondary | ICD-10-CM | POA: Diagnosis not present

## 2015-11-10 DIAGNOSIS — H35033 Hypertensive retinopathy, bilateral: Secondary | ICD-10-CM | POA: Diagnosis not present

## 2015-11-18 ENCOUNTER — Ambulatory Visit (INDEPENDENT_AMBULATORY_CARE_PROVIDER_SITE_OTHER): Payer: Medicare PPO

## 2015-11-18 DIAGNOSIS — Z23 Encounter for immunization: Secondary | ICD-10-CM | POA: Diagnosis not present

## 2016-01-27 DIAGNOSIS — C4441 Basal cell carcinoma of skin of scalp and neck: Secondary | ICD-10-CM | POA: Diagnosis not present

## 2016-03-12 ENCOUNTER — Ambulatory Visit (INDEPENDENT_AMBULATORY_CARE_PROVIDER_SITE_OTHER): Payer: Medicare PPO | Admitting: Family Medicine

## 2016-03-12 VITALS — BP 150/100 | HR 81 | Temp 98.1°F | Ht 68.0 in | Wt 203.6 lb

## 2016-03-12 DIAGNOSIS — R9431 Abnormal electrocardiogram [ECG] [EKG]: Secondary | ICD-10-CM

## 2016-03-12 DIAGNOSIS — J0110 Acute frontal sinusitis, unspecified: Secondary | ICD-10-CM | POA: Diagnosis not present

## 2016-03-12 DIAGNOSIS — R030 Elevated blood-pressure reading, without diagnosis of hypertension: Secondary | ICD-10-CM

## 2016-03-12 DIAGNOSIS — R42 Dizziness and giddiness: Secondary | ICD-10-CM | POA: Diagnosis not present

## 2016-03-12 LAB — GLUCOSE, POCT (MANUAL RESULT ENTRY): POC Glucose: 97 mg/dl (ref 70–99)

## 2016-03-12 MED ORDER — MECLIZINE HCL 25 MG PO TABS: 25.0000 mg | ORAL_TABLET | Freq: Three times a day (TID) | ORAL | 0 refills | Status: DC | PRN

## 2016-03-12 MED ORDER — AMOXICILLIN-POT CLAVULANATE 875-125 MG PO TABS: 1.0000 | ORAL_TABLET | Freq: Two times a day (BID) | ORAL | 0 refills | Status: DC

## 2016-03-12 NOTE — Patient Instructions (Addendum)
Your dizziness appears to be due to vertigo which is likely due to congestion from infection and pressure in sinuses. Saline or salt water nasal spray 4-5 times per day will help with nasal congestion and hopefully lessen risk of nosebleeds. Start Augmentin 1 pill twice per day for sinus infection, meclizine up to every 6 hours as needed for dizziness. Make sure you're drinking plenty of fluids. Return to the clinic or go to the nearest emergency room if any of your symptoms worsen or new symptoms occur.  Sinusitis, Adult Sinusitis is soreness and inflammation of your sinuses. Sinuses are hollow spaces in the bones around your face. Your sinuses are located:  Around your eyes.  In the middle of your forehead.  Behind your nose.  In your cheekbones. Your sinuses and nasal passages are lined with a stringy fluid (mucus). Mucus normally drains out of your sinuses. When your nasal tissues become inflamed or swollen, the mucus can become trapped or blocked so air cannot flow through your sinuses. This allows bacteria, viruses, and funguses to grow, which leads to infection. Sinusitis can develop quickly and last for 7?10 days (acute) or for more than 12 weeks (chronic). Sinusitis often develops after a cold. What are the causes? This condition is caused by anything that creates swelling in the sinuses or stops mucus from draining, including:  Allergies.  Asthma.  Bacterial or viral infection.  Abnormally shaped bones between the nasal passages.  Nasal growths that contain mucus (nasal polyps).  Narrow sinus openings.  Pollutants, such as chemicals or irritants in the air.  A foreign object stuck in the nose.  A fungal infection. This is rare. What increases the risk? The following factors may make you more likely to develop this condition:  Having allergies or asthma.  Having had a recent cold or respiratory tract infection.  Having structural deformities or blockages in your  nose or sinuses.  Having a weak immune system.  Doing a lot of swimming or diving.  Overusing nasal sprays.  Smoking. What are the signs or symptoms? The main symptoms of this condition are pain and a feeling of pressure around the affected sinuses. Other symptoms include:  Upper toothache.  Earache.  Headache.  Bad breath.  Decreased sense of smell and taste.  A cough that may get worse at night.  Fatigue.  Fever.  Thick drainage from your nose. The drainage is often green and it may contain pus (purulent).  Stuffy nose or congestion.  Postnasal drip. This is when extra mucus collects in the throat or back of the nose.  Swelling and warmth over the affected sinuses.  Sore throat.  Sensitivity to light. How is this diagnosed? This condition is diagnosed based on symptoms, a medical history, and a physical exam. To find out if your condition is acute or chronic, your health care provider may:  Look in your nose for signs of nasal polyps.  Tap over the affected sinus to check for signs of infection.  View the inside of your sinuses using an imaging device that has a light attached (endoscope). If your health care provider suspects that you have chronic sinusitis, you may also:  Be tested for allergies.  Have a sample of mucus taken from your nose (nasal culture) and checked for bacteria.  Have a mucus sample examined to see if your sinusitis is related to an allergy. If your sinusitis does not respond to treatment and it lasts longer than 8 weeks, you may have an  MRI or CT scan to check your sinuses. These scans also help to determine how severe your infection is. In rare cases, a bone biopsy may be done to rule out more serious types of fungal sinus disease. How is this treated? Treatment for sinusitis depends on the cause and whether your condition is chronic or acute. If a virus is causing your sinusitis, your symptoms will go away on their own within 10  days. You may be given medicines to relieve your symptoms, including:  Topical nasal decongestants. They shrink swollen nasal passages and let mucus drain from your sinuses.  Antihistamines. These drugs block inflammation that is triggered by allergies. This can help to ease swelling in your nose and sinuses.  Topical nasal corticosteroids. These are nasal sprays that ease inflammation and swelling in your nose and sinuses.  Nasal saline washes. These rinses can help to get rid of thick mucus in your nose. If your condition is caused by bacteria, you will be given an antibiotic medicine. If your condition is caused by a fungus, you will be given an antifungal medicine. Surgery may be needed to correct underlying conditions, such as narrow nasal passages. Surgery may also be needed to remove polyps. Follow these instructions at home: Medicines  Take, use, or apply over-the-counter and prescription medicines only as told by your health care provider. These may include nasal sprays.  If you were prescribed an antibiotic medicine, take it as told by your health care provider. Do not stop taking the antibiotic even if you start to feel better. Hydrate and Humidify  Drink enough water to keep your urine clear or pale yellow. Staying hydrated will help to thin your mucus.  Use a cool mist humidifier to keep the humidity level in your home above 50%.  Inhale steam for 10-15 minutes, 3-4 times a day or as told by your health care provider. You can do this in the bathroom while a hot shower is running.  Limit your exposure to cool or dry air. Rest  Rest as much as possible.  Sleep with your head raised (elevated).  Make sure to get enough sleep each night. General instructions  Apply a warm, moist washcloth to your face 3-4 times a day or as told by your health care provider. This will help with discomfort.  Wash your hands often with soap and water to reduce your exposure to viruses and  other germs. If soap and water are not available, use hand sanitizer.  Do not smoke. Avoid being around people who are smoking (secondhand smoke).  Keep all follow-up visits as told by your health care provider. This is important. Contact a health care provider if:  You have a fever.  Your symptoms get worse.  Your symptoms do not improve within 10 days. Get help right away if:  You have a severe headache.  You have persistent vomiting.  You have pain or swelling around your face or eyes.  You have vision problems.  You develop confusion.  Your neck is stiff.  You have trouble breathing. This information is not intended to replace advice given to you by your health care provider. Make sure you discuss any questions you have with your health care provider. Document Released: 02/01/2005 Document Revised: 09/28/2015 Document Reviewed: 11/27/2014 Elsevier Interactive Patient Education  2017 [employer] Vertigo Vertigo is the feeling that you or your surroundings are moving when they are not. Vertigo can be dangerous if it occurs while you are doing something that  could endanger you or others, such as driving. What are the causes? This condition is caused by a disturbance in the signals that are sent by your body's sensory systems to your brain. Different causes of a disturbance can lead to vertigo, including:  Infections, especially in the inner ear.  A bad reaction to a drug, or misuse of alcohol and medicines.  Withdrawal from drugs or alcohol.  Quickly changing positions, as when lying down or rolling over in bed.  Migraine headaches.  Decreased blood flow to the brain.  Decreased blood pressure.  Increased pressure in the brain from a head or neck injury, stroke, infection, tumor, or bleeding.  Central nervous system disorders. What are the signs or symptoms? Symptoms of this condition usually occur when you move your head or your eyes in different directions.  Symptoms may start suddenly, and they usually last for less than a minute. Symptoms may include:  Loss of balance and falling.  Feeling like you are spinning or moving.  Feeling like your surroundings are spinning or moving.  Nausea and vomiting.  Blurred vision or double vision.  Difficulty hearing.  Slurred speech.  Dizziness.  Involuntary eye movement (nystagmus). Symptoms can be mild and cause only slight annoyance, or they can be severe and interfere with daily life. Episodes of vertigo may return (recur) over time, and they are often triggered by certain movements. Symptoms may improve over time. How is this diagnosed? This condition may be diagnosed based on medical history and the quality of your nystagmus. Your health care provider may test your eye movements by asking you to quickly change positions to trigger the nystagmus. This may be called the Dix-Hallpike test, head thrust test, or roll test. You may be referred to a health care provider who specializes in ear, nose, and throat (ENT) problems (otolaryngologist) or a provider who specializes in disorders of the central nervous system (neurologist). You may have additional testing, including:  A physical exam.  Blood tests.  MRI.  A CT scan.  An electrocardiogram (ECG). This records electrical activity in your heart.  An electroencephalogram (EEG). This records electrical activity in your brain.  Hearing tests. How is this treated? Treatment for this condition depends on the cause and the severity of the symptoms. Treatment options include:  Medicines to treat nausea or vertigo. These are usually used for severe cases. Some medicines that are used to treat other conditions may also reduce or eliminate vertigo symptoms. These include:  Medicines that control allergies (antihistamines).  Medicines that control seizures (anticonvulsants).  Medicines that relieve depression (antidepressants).  Medicines that  relieve anxiety (sedatives).  Head movements to adjust your inner ear back to normal. If your vertigo is caused by an ear problem, your health care provider may recommend certain movements to correct the problem.  Surgery. This is rare. Follow these instructions at home: Safety  Move slowly.Avoid sudden body or head movements.  Avoid driving.  Avoid operating heavy machinery.  Avoid doing any tasks that would cause danger to you or others if you would have a vertigo episode during the task.  If you have trouble walking or keeping your balance, try using a cane for stability. If you feel dizzy or unstable, sit down right away.  Return to your normal activities as told by your health care provider. Ask your health care provider what activities are safe for you. General instructions  Take over-the-counter and prescription medicines only as told by your health care provider.  Avoid  certain positions or movements as told by your health care provider.  Drink enough fluid to keep your urine clear or pale yellow.  Keep all follow-up visits as told by your health care provider. This is important. Contact a health care provider if:  Your medicines do not relieve your vertigo or they make it worse.  You have a fever.  Your condition gets worse or you develop new symptoms.  Your family or friends notice any behavioral changes.  Your nausea or vomiting gets worse.  You have numbness or a "pins and needles" sensation in part of your body. Get help right away if:  You have difficulty moving or speaking.  You are always dizzy.  You faint.  You develop severe headaches.  You have weakness in your hands, arms, or legs.  You have changes in your hearing or vision.  You develop a stiff neck.  You develop sensitivity to light. This information is not intended to replace advice given to you by your health care provider. Make sure you discuss any questions you have with your health  care provider. Document Released: 11/11/2004 Document Revised: 07/16/2015 Document Reviewed: 05/27/2014 Elsevier Interactive Patient Education  2017 [employer]  Dizziness Dizziness is a common problem. It is a feeling of unsteadiness or light-headedness. You may feel like you are about to faint. Dizziness can lead to injury if you stumble or fall. Anyone can become dizzy, but dizziness is more common in older adults. This condition can be caused by a number of things, including medicines, dehydration, or illness. Follow these instructions at home: Taking these steps may help with your condition: Eating and drinking  Drink enough fluid to keep your urine clear or pale yellow. This helps to keep you from becoming dehydrated. Try to drink more clear fluids, such as water.  Do not drink alcohol.  Limit your caffeine intake if directed by your health care provider.  Limit your salt intake if directed by your health care provider. Activity  Avoid making quick movements.  Rise slowly from chairs and steady yourself until you feel okay.  In the morning, first sit up on the side of the bed. When you feel okay, stand slowly while you hold onto something until you know that your balance is fine.  Move your legs often if you need to stand in one place for a long time. Tighten and relax your muscles in your legs while you are standing.  Do not drive or operate heavy machinery if you feel dizzy.  Avoid bending down if you feel dizzy. Place items in your home so that they are easy for you to reach without leaning over. Lifestyle  Do not use any tobacco products, including cigarettes, chewing tobacco, or electronic cigarettes. If you need help quitting, ask your health care provider.  Try to reduce your stress level, such as with yoga or meditation. Talk with your health care provider if you need help. General instructions  Watch your dizziness for any changes.  Take medicines only as  directed by your health care provider. Talk with your health care provider if you think that your dizziness is caused by a medicine that you are taking.  Tell a friend or a family member that you are feeling dizzy. If he or she notices any changes in your behavior, have this person call your health care provider.  Keep all follow-up visits as directed by your health care provider. This is important. Contact a health care provider if:  Your dizziness does not go away.  Your dizziness or light-headedness gets worse.  You feel nauseous.  You have reduced hearing.  You have new symptoms.  You are unsteady on your feet or you feel like the room is spinning. Get help right away if:  You vomit or have diarrhea and are unable to eat or drink anything.  You have problems talking, walking, swallowing, or using your arms, hands, or legs.  You feel generally weak.  You are not thinking clearly or you have trouble forming sentences. It may take a friend or family member to notice this.  You have chest pain, abdominal pain, shortness of breath, or sweating.  Your vision changes.  You notice any bleeding.  You have a headache.  You have neck pain or a stiff neck.  You have a fever. This information is not intended to replace advice given to you by your health care provider. Make sure you discuss any questions you have with your health care provider. Document Released: 07/28/2000 Document Revised: 07/10/2015 Document Reviewed: 01/28/2014 Elsevier Interactive Patient Education  2017 [employer].     IF you received an x-ray today, you will receive an invoice from [facility]. Please contact [facility] at [phone] with questions or concerns regarding your invoice.   IF you received labwork today, you will receive an invoice from [facility]. Please contact [facility] at [phone] with questions or concerns regarding your invoice.   Our billing staff will not be  able to assist you with questions regarding bills from these companies.  You will be contacted with the lab results as soon as they are available. The fastest way to get your results is to activate your My Chart account. Instructions are located on the last page of this paperwork. If you have not heard from [place] regarding the results in 2 weeks, please contact this office.

## 2016-03-12 NOTE — Progress Notes (Signed)
Subjective:  By signing my name below, I, Raven Haynes, attest that this documentation has been prepared under the direction and in the presence of [name], MD.  Electronically Signed: [name], ED Scribe. 03/12/2016. 9:28 AM.   Patient ID: Jack Haynes, male    DOB: February 21, 1940, 76 y.o.   MRN: [id number]  HPI  Chief Complaint  Patient presents with  . Dizziness    X 3 weeks and light headed   Review of records prior to visit.  HPI Comments: Jack Haynes is a 76 y.o. male who presents to the Primary Care at [facility] complaining of dizziness onset 3 weeks. Hx of prostate cancer, abnormal EKG. He was evaluated 02/2015 by cardiology with incomplete RBBB and poor R wave progression. Family hx of brother with MI at 71 and sister with CAD in 64's. He had a bruce treadmill stress test 01/2015 that was low risk without ischemic changes as he was asymptomatic 02/2014. No further work up per cardiology. Most recent CMP borderline glucose of 106 in 12/2014.   Per patient, symptoms started with congestion, rhinorrhea, cough, HA sneezing 1 month ago. At that time he took alka seltzer with some relief as well as aleve. Pt states fatigue has persist along with dizziness and light headiness which began 3 weeks ago. He reports worsening dizziness in the morning when he sits up after waking up. He also notes occasional nasal congestion, HA and sinus pressure behind right eye. He finds relief with hot showers. He denies CP chest tightness, palpitation, slurred speech, leg weakness.   Patient Active Problem List   Diagnosis Date Noted  . Abnormal EKG 01/02/2015  . Prostate cancer ([place]) 11/01/2011   Past Medical History:  Diagnosis Date  . Arthritis   . Cancer ([place])    hx of prostatae cancer   Past Surgical History:  Procedure Laterality Date  . APPENDECTOMY    . BACK SURGERY  1998  . EYE SURGERY Left 1965  . JOINT REPLACEMENT Left 2010   left knee replacement  . KNEE SURGERY    .  PROSTATE SURGERY    . SPINE SURGERY    . TOTAL KNEE ARTHROPLASTY  02/01/2012   Procedure: TOTAL KNEE ARTHROPLASTY;  Surgeon: Sydnee Cabal, MD;  Location: [facility];  Service: Orthopedics;  Laterality: Left;   No Known Allergies Prior to Admission medications   Medication Sig Start Date End Date Taking? Authorizing Provider  amoxicillin (AMOXIL) 500 MG capsule Take 4 capsules by mouth as needed. One hour prior to dental work 01/27/15  Yes Historical Provider, MD  carboxymethylcellulose (REFRESH PLUS) 0.5 % SOLN Place 1 drop into the left eye daily.   Yes Historical Provider, MD  naproxen sodium (ANAPROX) 220 MG tablet Take 220 mg by mouth as needed.    Yes Historical Provider, MD   Social History   Social History  . Marital status: Divorced    Spouse name: N/A  . Number of children: 2  . Years of education: 12   Occupational History  . Facilities manager [employer]    Social History Main Topics  . Smoking status: Former Smoker    Quit date: 02/15/1981  . Smokeless tobacco: Current User    Types: Chew  . Alcohol use 3.0 oz/week    5 Shots of liquor per week     Comment:  beer or mixed drink  . Drug use: No  . Sexual activity: Not on file   Other  Topics Concern  . Not on file   Social History Narrative  . No narrative on file   Review of Systems  Constitutional: Positive for fatigue. Negative for chills and fever.  HENT: Positive for congestion, sinus pain and sinus pressure.   Cardiovascular: Negative for chest pain and palpitations.  Neurological: Positive for dizziness, light-headedness and headaches. Negative for speech difficulty and weakness.   Objective:   Physical Exam  Constitutional: He is oriented to person, place, and time. He appears well-developed and well-nourished.  HENT:  Head: Normocephalic and atraumatic.  Right Ear: Tympanic membrane, external ear and ear canal normal.  Left Ear: Tympanic membrane, external ear and ear canal normal.  Nose: No  rhinorrhea.  Mouth/Throat: Oropharynx is clear and moist and mucous membranes are normal. No oropharyngeal exudate or posterior oropharyngeal erythema.  No active bleeding slight edema of left turbinate.   Eyes: Conjunctivae are normal. Pupils are equal, round, and reactive to light. Right eye exhibits nystagmus. Left eye exhibits nystagmus.  2-3 beats of horizontal nystagmus with looking left. Worse with supine with sitting.   Neck: Neck supple. No JVD present. Carotid bruit is not present.  Cardiovascular: Normal rate, regular rhythm, normal heart sounds and intact distal pulses.   No murmur heard. Pulmonary/Chest: Effort normal and breath sounds normal. He has no wheezes. He has no rhonchi. He has no rales.  Abdominal: Soft. There is no tenderness.  Musculoskeletal: He exhibits no edema.  Lymphadenopathy:    He has no cervical adenopathy.  Neurological: He is alert and oriented to person, place, and time. He displays a negative Romberg sign.  No pronator drift. Slightly unsteady with heel to toe but negative findings  Skin: Skin is warm and dry. No rash noted.  Psychiatric: He has a normal mood and affect. His behavior is normal.  Vitals reviewed.   Vitals:   03/12/16 0859 03/12/16 0904  BP: (!) 152/74 (!) 150/100  Pulse: 81   Temp: 98.1 F (36.7 C)   TempSrc: Oral   SpO2: 97%   Weight: 203 lb 9.6 oz (92.4 kg)   Height: '5\' 8"'$  (1.727 m)    Results for orders placed or performed in visit on 03/12/16  POCT glucose (manual entry)  Result Value Ref Range   POC Glucose 97 70 - 99 mg/dl   EKG- incomplete RBBB no acute finding or changes from previous EKG.   Assessment & Plan:    [name] is a 76 y.o. male Dizziness - Plan: CBC, POCT glucose (manual entry), Basic metabolic panel, meclizine (ANTIVERT) 25 MG tablet  Lightheadedness - Plan: CBC, POCT glucose (manual entry)  Elevated blood pressure reading - Plan: EKG 09-OBSJ, Basic metabolic panel  Abnormal EKG - Plan:  EKG 12-Lead  Vertigo - Plan: meclizine (ANTIVERT) 25 MG tablet  Acute frontal sinusitis, recurrence not specified - Plan: amoxicillin-clavulanate (AUGMENTIN) 875-125 MG tablet  Suspected vertigo with underlying sinusitis and eustachian tube dysfunction. EKG without any acute findings or significant changes from previous, will check BMP and CBC, but in office glucose reassuring. Nonfocal neuro exam, and with nystagmus appears to be vertigo. Elevated BP may be due to cold medications, again nonfocal exam.  -Augmentin for sinusitis, meclizine for vertigo, fluids, relative rest, and RTC/ER precautions were discussed.  Meds ordered this encounter  Medications  . amoxicillin-clavulanate (AUGMENTIN) 875-125 MG tablet    Sig: Take 1 tablet by mouth 2 (two) times daily.    Dispense:  20 tablet    Refill:  0  .  meclizine (ANTIVERT) 25 MG tablet    Sig: Take 1 tablet (25 mg total) by mouth 3 (three) times daily as needed for dizziness.    Dispense:  30 tablet    Refill:  0   Patient Instructions   Your dizziness appears to be due to vertigo which is likely due to congestion from infection and pressure in sinuses. Saline or salt water nasal spray 4-5 times per day will help with nasal congestion and hopefully lessen risk of nosebleeds. Start Augmentin 1 pill twice per day for sinus infection, meclizine up to every 6 hours as needed for dizziness. Make sure you're drinking plenty of fluids. Return to the clinic or go to the nearest emergency room if any of your symptoms worsen or new symptoms occur.  Sinusitis, Adult Sinusitis is soreness and inflammation of your sinuses. Sinuses are hollow spaces in the bones around your face. Your sinuses are located:  Around your eyes.  In the middle of your forehead.  Behind your nose.  In your cheekbones. Your sinuses and nasal passages are lined with a stringy fluid (mucus). Mucus normally drains out of your sinuses. When your nasal tissues become  inflamed or swollen, the mucus can become trapped or blocked so air cannot flow through your sinuses. This allows bacteria, viruses, and funguses to grow, which leads to infection. Sinusitis can develop quickly and last for 7?10 days (acute) or for more than 12 weeks (chronic). Sinusitis often develops after a cold. What are the causes? This condition is caused by anything that creates swelling in the sinuses or stops mucus from draining, including:  Allergies.  Asthma.  Bacterial or viral infection.  Abnormally shaped bones between the nasal passages.  Nasal growths that contain mucus (nasal polyps).  Narrow sinus openings.  Pollutants, such as chemicals or irritants in the air.  A foreign object stuck in the nose.  A fungal infection. This is rare. What increases the risk? The following factors may make you more likely to develop this condition:  Having allergies or asthma.  Having had a recent cold or respiratory tract infection.  Having structural deformities or blockages in your nose or sinuses.  Having a weak immune system.  Doing a lot of swimming or diving.  Overusing nasal sprays.  Smoking. What are the signs or symptoms? The main symptoms of this condition are pain and a feeling of pressure around the affected sinuses. Other symptoms include:  Upper toothache.  Earache.  Headache.  Bad breath.  Decreased sense of smell and taste.  A cough that may get worse at night.  Fatigue.  Fever.  Thick drainage from your nose. The drainage is often green and it may contain pus (purulent).  Stuffy nose or congestion.  Postnasal drip. This is when extra mucus collects in the throat or back of the nose.  Swelling and warmth over the affected sinuses.  Sore throat.  Sensitivity to light. How is this diagnosed? This condition is diagnosed based on symptoms, a medical history, and a physical exam. To find out if your condition is acute or chronic, your  health care provider may:  Look in your nose for signs of nasal polyps.  Tap over the affected sinus to check for signs of infection.  View the inside of your sinuses using an imaging device that has a light attached (endoscope). If your health care provider suspects that you have chronic sinusitis, you may also:  Be tested for allergies.  Have a sample of mucus  taken from your nose (nasal culture) and checked for bacteria.  Have a mucus sample examined to see if your sinusitis is related to an allergy. If your sinusitis does not respond to treatment and it lasts longer than 8 weeks, you may have an MRI or CT scan to check your sinuses. These scans also help to determine how severe your infection is. In rare cases, a bone biopsy may be done to rule out more serious types of fungal sinus disease. How is this treated? Treatment for sinusitis depends on the cause and whether your condition is chronic or acute. If a virus is causing your sinusitis, your symptoms will go away on their own within 10 days. You may be given medicines to relieve your symptoms, including:  Topical nasal decongestants. They shrink swollen nasal passages and let mucus drain from your sinuses.  Antihistamines. These drugs block inflammation that is triggered by allergies. This can help to ease swelling in your nose and sinuses.  Topical nasal corticosteroids. These are nasal sprays that ease inflammation and swelling in your nose and sinuses.  Nasal saline washes. These rinses can help to get rid of thick mucus in your nose. If your condition is caused by bacteria, you will be given an antibiotic medicine. If your condition is caused by a fungus, you will be given an antifungal medicine. Surgery may be needed to correct underlying conditions, such as narrow nasal passages. Surgery may also be needed to remove polyps. Follow these instructions at home: Medicines  Take, use, or apply over-the-counter and prescription  medicines only as told by your health care provider. These may include nasal sprays.  If you were prescribed an antibiotic medicine, take it as told by your health care provider. Do not stop taking the antibiotic even if you start to feel better. Hydrate and Humidify  Drink enough water to keep your urine clear or pale yellow. Staying hydrated will help to thin your mucus.  Use a cool mist humidifier to keep the humidity level in your home above 50%.  Inhale steam for 10-15 minutes, 3-4 times a day or as told by your health care provider. You can do this in the bathroom while a hot shower is running.  Limit your exposure to cool or dry air. Rest  Rest as much as possible.  Sleep with your head raised (elevated).  Make sure to get enough sleep each night. General instructions  Apply a warm, moist washcloth to your face 3-4 times a day or as told by your health care provider. This will help with discomfort.  Wash your hands often with soap and water to reduce your exposure to viruses and other germs. If soap and water are not available, use hand sanitizer.  Do not smoke. Avoid being around people who are smoking (secondhand smoke).  Keep all follow-up visits as told by your health care provider. This is important. Contact a health care provider if:  You have a fever.  Your symptoms get worse.  Your symptoms do not improve within 10 days. Get help right away if:  You have a severe headache.  You have persistent vomiting.  You have pain or swelling around your face or eyes.  You have vision problems.  You develop confusion.  Your neck is stiff.  You have trouble breathing. This information is not intended to replace advice given to you by your health care provider. Make sure you discuss any questions you have with your health care provider. Document Released:  02/01/2005 Document Revised: 09/28/2015 Document Reviewed: 11/27/2014 Elsevier Interactive Patient Education   2017 [employer]. Vertigo Vertigo is the feeling that you or your surroundings are moving when they are not. Vertigo can be dangerous if it occurs while you are doing something that could endanger you or others, such as driving. What are the causes? This condition is caused by a disturbance in the signals that are sent by your body's sensory systems to your brain. Different causes of a disturbance can lead to vertigo, including:  Infections, especially in the inner ear.  A bad reaction to a drug, or misuse of alcohol and medicines.  Withdrawal from drugs or alcohol.  Quickly changing positions, as when lying down or rolling over in bed.  Migraine headaches.  Decreased blood flow to the brain.  Decreased blood pressure.  Increased pressure in the brain from a head or neck injury, stroke, infection, tumor, or bleeding.  Central nervous system disorders. What are the signs or symptoms? Symptoms of this condition usually occur when you move your head or your eyes in different directions. Symptoms may start suddenly, and they usually last for less than a minute. Symptoms may include:  Loss of balance and falling.  Feeling like you are spinning or moving.  Feeling like your surroundings are spinning or moving.  Nausea and vomiting.  Blurred vision or double vision.  Difficulty hearing.  Slurred speech.  Dizziness.  Involuntary eye movement (nystagmus). Symptoms can be mild and cause only slight annoyance, or they can be severe and interfere with daily life. Episodes of vertigo may return (recur) over time, and they are often triggered by certain movements. Symptoms may improve over time. How is this diagnosed? This condition may be diagnosed based on medical history and the quality of your nystagmus. Your health care provider may test your eye movements by asking you to quickly change positions to trigger the nystagmus. This may be called the Dix-Hallpike test, head thrust  test, or roll test. You may be referred to a health care provider who specializes in ear, nose, and throat (ENT) problems (otolaryngologist) or a provider who specializes in disorders of the central nervous system (neurologist). You may have additional testing, including:  A physical exam.  Blood tests.  MRI.  A CT scan.  An electrocardiogram (ECG). This records electrical activity in your heart.  An electroencephalogram (EEG). This records electrical activity in your brain.  Hearing tests. How is this treated? Treatment for this condition depends on the cause and the severity of the symptoms. Treatment options include:  Medicines to treat nausea or vertigo. These are usually used for severe cases. Some medicines that are used to treat other conditions may also reduce or eliminate vertigo symptoms. These include:  Medicines that control allergies (antihistamines).  Medicines that control seizures (anticonvulsants).  Medicines that relieve depression (antidepressants).  Medicines that relieve anxiety (sedatives).  Head movements to adjust your inner ear back to normal. If your vertigo is caused by an ear problem, your health care provider may recommend certain movements to correct the problem.  Surgery. This is rare. Follow these instructions at home: Safety  Move slowly.Avoid sudden body or head movements.  Avoid driving.  Avoid operating heavy machinery.  Avoid doing any tasks that would cause danger to you or others if you would have a vertigo episode during the task.  If you have trouble walking or keeping your balance, try using a cane for stability. If you feel dizzy or unstable, sit down  right away.  Return to your normal activities as told by your health care provider. Ask your health care provider what activities are safe for you. General instructions  Take over-the-counter and prescription medicines only as told by your health care provider.  Avoid certain  positions or movements as told by your health care provider.  Drink enough fluid to keep your urine clear or pale yellow.  Keep all follow-up visits as told by your health care provider. This is important. Contact a health care provider if:  Your medicines do not relieve your vertigo or they make it worse.  You have a fever.  Your condition gets worse or you develop new symptoms.  Your family or friends notice any behavioral changes.  Your nausea or vomiting gets worse.  You have numbness or a "pins and needles" sensation in part of your body. Get help right away if:  You have difficulty moving or speaking.  You are always dizzy.  You faint.  You develop severe headaches.  You have weakness in your hands, arms, or legs.  You have changes in your hearing or vision.  You develop a stiff neck.  You develop sensitivity to light. This information is not intended to replace advice given to you by your health care provider. Make sure you discuss any questions you have with your health care provider. Document Released: 11/11/2004 Document Revised: 07/16/2015 Document Reviewed: 05/27/2014 Elsevier Interactive Patient Education  2017 [employer]  Dizziness Dizziness is a common problem. It is a feeling of unsteadiness or light-headedness. You may feel like you are about to faint. Dizziness can lead to injury if you stumble or fall. Anyone can become dizzy, but dizziness is more common in older adults. This condition can be caused by a number of things, including medicines, dehydration, or illness. Follow these instructions at home: Taking these steps may help with your condition: Eating and drinking  Drink enough fluid to keep your urine clear or pale yellow. This helps to keep you from becoming dehydrated. Try to drink more clear fluids, such as water.  Do not drink alcohol.  Limit your caffeine intake if directed by your health care provider.  Limit your salt intake if  directed by your health care provider. Activity  Avoid making quick movements.  Rise slowly from chairs and steady yourself until you feel okay.  In the morning, first sit up on the side of the bed. When you feel okay, stand slowly while you hold onto something until you know that your balance is fine.  Move your legs often if you need to stand in one place for a long time. Tighten and relax your muscles in your legs while you are standing.  Do not drive or operate heavy machinery if you feel dizzy.  Avoid bending down if you feel dizzy. Place items in your home so that they are easy for you to reach without leaning over. Lifestyle  Do not use any tobacco products, including cigarettes, chewing tobacco, or electronic cigarettes. If you need help quitting, ask your health care provider.  Try to reduce your stress level, such as with yoga or meditation. Talk with your health care provider if you need help. General instructions  Watch your dizziness for any changes.  Take medicines only as directed by your health care provider. Talk with your health care provider if you think that your dizziness is caused by a medicine that you are taking.  Tell a friend or a family member that  you are feeling dizzy. If he or she notices any changes in your behavior, have this person call your health care provider.  Keep all follow-up visits as directed by your health care provider. This is important. Contact a health care provider if:  Your dizziness does not go away.  Your dizziness or light-headedness gets worse.  You feel nauseous.  You have reduced hearing.  You have new symptoms.  You are unsteady on your feet or you feel like the room is spinning. Get help right away if:  You vomit or have diarrhea and are unable to eat or drink anything.  You have problems talking, walking, swallowing, or using your arms, hands, or legs.  You feel generally weak.  You are not thinking clearly or  you have trouble forming sentences. It may take a friend or family member to notice this.  You have chest pain, abdominal pain, shortness of breath, or sweating.  Your vision changes.  You notice any bleeding.  You have a headache.  You have neck pain or a stiff neck.  You have a fever. This information is not intended to replace advice given to you by your health care provider. Make sure you discuss any questions you have with your health care provider. Document Released: 07/28/2000 Document Revised: 07/10/2015 Document Reviewed: 01/28/2014 Elsevier Interactive Patient Education  2017 [employer].     IF you received an x-ray today, you will receive an invoice from [facility]. Please contact [facility] at [phone] with questions or concerns regarding your invoice.   IF you received labwork today, you will receive an invoice from [facility]. Please contact [facility] at [phone] with questions or concerns regarding your invoice.   Our billing staff will not be able to assist you with questions regarding bills from these companies.  You will be contacted with the lab results as soon as they are available. The fastest way to get your results is to activate your My Chart account. Instructions are located on the last page of this paperwork. If you have not heard from [place] regarding the results in 2 weeks, please contact this office.       /I personally performed the services described in this documentation, which was scribed in my presence. The recorded information has been reviewed and considered, and addended by me as needed.   Signed,   [name], MD [facility].  03/12/16 10:31 AM

## 2016-03-13 LAB — BASIC METABOLIC PANEL
BUN / CREAT RATIO: 15 (ref 10–24)
BUN: 15 mg/dL (ref 8–27)
CO2: 22 mmol/L (ref 18–29)
CREATININE: 1.01 mg/dL (ref 0.76–1.27)
Calcium: 8.6 mg/dL (ref 8.6–10.2)
Chloride: 102 mmol/L (ref 96–106)
GFR, EST AFRICAN AMERICAN: 84 mL/min/{1.73_m2} (ref >59)
GFR, EST NON AFRICAN AMERICAN: 72 mL/min/{1.73_m2} (ref >59)
GLUCOSE: 121 mg/dL — AB (ref 65–99)
Potassium: 4.1 mmol/L (ref 3.5–5.2)
SODIUM: 140 mmol/L (ref 134–144)

## 2016-03-13 LAB — CBC
HEMOGLOBIN: 15.2 g/dL (ref 13.0–17.7)
Hematocrit: 44.9 % (ref 37.5–51.0)
MCH: 30.5 pg (ref 26.6–33.0)
MCHC: 33.9 g/dL (ref 31.5–35.7)
MCV: 90 fL (ref 79–97)
Platelets: 192 10*3/uL (ref 150–379)
RBC: 4.98 x10E6/uL (ref 4.14–5.80)
RDW: 13.7 % (ref 12.3–15.4)
WBC: 6.8 10*3/uL (ref 3.4–10.8)

## 2016-04-05 ENCOUNTER — Ambulatory Visit: Payer: Medicare PPO | Admitting: Adult Health

## 2016-04-21 DIAGNOSIS — D2932 Benign neoplasm of left epididymis: Secondary | ICD-10-CM | POA: Diagnosis not present

## 2016-04-29 DIAGNOSIS — Z8546 Personal history of malignant neoplasm of prostate: Secondary | ICD-10-CM | POA: Diagnosis not present

## 2016-04-29 DIAGNOSIS — N4342 Spermatocele of epididymis, multiple: Secondary | ICD-10-CM | POA: Diagnosis not present

## 2016-05-03 DIAGNOSIS — M542 Cervicalgia: Secondary | ICD-10-CM | POA: Diagnosis not present

## 2016-05-10 DIAGNOSIS — H40013 Open angle with borderline findings, low risk, bilateral: Secondary | ICD-10-CM | POA: Diagnosis not present

## 2016-05-10 DIAGNOSIS — H40051 Ocular hypertension, right eye: Secondary | ICD-10-CM | POA: Diagnosis not present

## 2016-06-20 DIAGNOSIS — M25531 Pain in right wrist: Secondary | ICD-10-CM | POA: Diagnosis not present

## 2016-08-19 DIAGNOSIS — Z008 Encounter for other general examination: Secondary | ICD-10-CM | POA: Diagnosis not present

## 2016-08-19 DIAGNOSIS — Z6828 Body mass index (BMI) 28.0-28.9, adult: Secondary | ICD-10-CM | POA: Diagnosis not present

## 2016-11-15 DIAGNOSIS — H40013 Open angle with borderline findings, low risk, bilateral: Secondary | ICD-10-CM | POA: Diagnosis not present

## 2016-11-15 DIAGNOSIS — H25013 Cortical age-related cataract, bilateral: Secondary | ICD-10-CM | POA: Diagnosis not present

## 2016-11-15 DIAGNOSIS — H2513 Age-related nuclear cataract, bilateral: Secondary | ICD-10-CM | POA: Diagnosis not present

## 2016-11-15 DIAGNOSIS — H40052 Ocular hypertension, left eye: Secondary | ICD-10-CM | POA: Diagnosis not present

## 2016-11-22 ENCOUNTER — Ambulatory Visit (INDEPENDENT_AMBULATORY_CARE_PROVIDER_SITE_OTHER): Payer: Medicare PPO | Admitting: Family Medicine

## 2016-11-22 VITALS — Temp 99.0°F | Wt 198.8 lb

## 2016-11-22 DIAGNOSIS — Z23 Encounter for immunization: Secondary | ICD-10-CM

## 2016-11-23 NOTE — Progress Notes (Signed)
Nurse visit for flu vaccine, I did not see this patient

## 2017-03-23 ENCOUNTER — Ambulatory Visit: Payer: Medicare PPO | Admitting: Emergency Medicine

## 2017-03-23 ENCOUNTER — Encounter: Payer: Self-pay | Admitting: Emergency Medicine

## 2017-03-23 ENCOUNTER — Other Ambulatory Visit: Payer: Self-pay

## 2017-03-23 VITALS — BP 126/72 | HR 82 | Temp 98.2°F | Resp 16 | Ht 68.0 in | Wt 196.2 lb

## 2017-03-23 DIAGNOSIS — J029 Acute pharyngitis, unspecified: Secondary | ICD-10-CM | POA: Diagnosis not present

## 2017-03-23 DIAGNOSIS — J040 Acute laryngitis: Secondary | ICD-10-CM

## 2017-03-23 DIAGNOSIS — J22 Unspecified acute lower respiratory infection: Secondary | ICD-10-CM | POA: Diagnosis not present

## 2017-03-23 DIAGNOSIS — R05 Cough: Secondary | ICD-10-CM | POA: Diagnosis not present

## 2017-03-23 DIAGNOSIS — R059 Cough, unspecified: Secondary | ICD-10-CM

## 2017-03-23 MED ORDER — PREDNISONE 20 MG PO TABS: 20.0000 mg | ORAL_TABLET | Freq: Every day | ORAL | 0 refills | Status: AC

## 2017-03-23 MED ORDER — PROMETHAZINE-CODEINE 6.25-10 MG/5ML PO SYRP: 5.0000 mL | ORAL_SOLUTION | Freq: Every evening | ORAL | 0 refills | Status: DC | PRN

## 2017-03-23 MED ORDER — AZITHROMYCIN 250 MG PO TABS
ORAL_TABLET | ORAL | 0 refills | Status: DC
Start: 2017-03-23 — End: 2017-04-01

## 2017-03-23 NOTE — Progress Notes (Signed)
Jack Haynes 77 y.o.   Chief Complaint  Patient presents with  . Cough    PRODUCTIVE per patient with blusih gray mucus  x 3 days  . Sore Throat    HISTORY OF PRESENT ILLNESS: This is a 77 y.o. male complaining of cough, congestion, sore throat, loss of voice, for the last 3 days.  Cough  This is a new problem. The current episode started in the past 7 days. The problem has been gradually worsening. The problem occurs every few minutes. The cough is productive of sputum. Associated symptoms include nasal congestion and a sore throat. Pertinent negatives include no chest pain, chills, ear congestion, ear pain, eye redness, fever, headaches, hemoptysis, myalgias, rash, shortness of breath, weight loss or wheezing. Associated symptoms comments: Loss of voice..     Prior to Admission medications   Medication Sig Start Date End Date Taking? Authorizing Provider  amoxicillin (AMOXIL) 500 MG capsule Take 4 capsules by mouth as needed. One hour prior to dental work 01/27/15  Yes [provider]  naproxen sodium (ANAPROX) 220 MG tablet Take 220 mg by mouth as needed.    Yes [provider]  amoxicillin-clavulanate (AUGMENTIN) 875-125 MG tablet Take 1 tablet by mouth 2 (two) times daily. Patient not taking: Reported on 03/23/2017 03/12/16   Wendie Agreste, MD  carboxymethylcellulose (REFRESH PLUS) 0.5 % SOLN Place 1 drop into the left eye daily.    [provider]  meclizine (ANTIVERT) 25 MG tablet Take 1 tablet (25 mg total) by mouth 3 (three) times daily as needed for dizziness. Patient not taking: Reported on 03/23/2017 03/12/16   Wendie Agreste, MD    No Known Allergies  Patient Active Problem List   Diagnosis Date Noted  . Abnormal EKG 01/02/2015  . Prostate cancer ([place]) 11/01/2011    Past Medical History:  Diagnosis Date  . Arthritis   . Cancer ([place])    hx of prostatae cancer    Past Surgical History:  Procedure Laterality Date  . APPENDECTOMY     . BACK SURGERY  1998  . EYE SURGERY Left 1965  . JOINT REPLACEMENT Left 2010   left knee replacement  . KNEE SURGERY    . PROSTATE SURGERY    . SPINE SURGERY    . TOTAL KNEE ARTHROPLASTY  02/01/2012   Procedure: TOTAL KNEE ARTHROPLASTY;  Surgeon: Sydnee Cabal, MD;  Location: [facility];  Service: Orthopedics;  Laterality: Left;    Social History   Socioeconomic History  . Marital status: Divorced    Spouse name: Not on file  . Number of children: 2  . Years of education: 12  . Highest education level: Not on file  Social Needs  . Financial resource strain: Not on file  . Food insecurity - worry: Not on file  . Food insecurity - inability: Not on file  . Transportation needs - medical: Not on file  . Transportation needs - non-medical: Not on file  Occupational History  . 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   Tobacco Use  . Smoking status: Former Smoker    Last attempt to quit: 02/15/1981    Years since quitting: 36.1  . Smokeless tobacco: Current User    Types: Chew  Substance and Sexual Activity  . Alcohol use: Yes    Alcohol/week: 3.0 oz    Types: 5 Shots of liquor per week    Comment:  beer or mixed drink  . Drug use: No  .  Sexual activity: Not on file  Other Topics Concern  . Not on file  Social History Narrative  . Not on file    Family History  Problem Relation Age of Onset  . Cancer Sister        brain tumor  . Heart disease Brother   . Alzheimer's disease Brother   . Heart disease Sister   . Alzheimer's disease Father   . Hypertension Sister   . Colon cancer Neg Hx   . Colon polyps Neg Hx   . Esophageal cancer Neg Hx   . Rectal cancer Neg Hx   . Stomach cancer Neg Hx      Review of Systems  Constitutional: Negative.  Negative for chills, fever and weight loss.  HENT: Positive for sore throat. Negative for ear pain.        Laryngitis  Eyes: Negative.  Negative for discharge and redness.  Respiratory: Positive for cough and  sputum production. Negative for hemoptysis, shortness of breath and wheezing.   Cardiovascular: Negative for chest pain, palpitations and leg swelling.  Gastrointestinal: Negative.  Negative for abdominal pain, diarrhea, nausea and vomiting.  Genitourinary: Negative.   Musculoskeletal: Negative for joint pain and myalgias.  Skin: Negative.  Negative for rash.  Neurological: Negative for dizziness and headaches.  Endo/Heme/Allergies: Negative.   All other systems reviewed and are negative.   Vitals:   03/23/17 1104  BP: 126/72  Pulse: 82  Resp: 16  Temp: 98.2 F (36.8 C)  SpO2: 95%    Physical Exam  Constitutional: He is oriented to person, place, and time. He appears well-developed and well-nourished.  HENT:  Head: Normocephalic and atraumatic.  Nose: Nose normal.  Mouth/Throat: Uvula is midline. Posterior oropharyngeal erythema present. No oropharyngeal exudate or tonsillar abscesses.  Eyes: Conjunctivae and EOM are normal. Pupils are equal, round, and reactive to light.  Neck: Normal range of motion. Neck supple.  Cardiovascular: Normal rate, regular rhythm and normal heart sounds.  Pulmonary/Chest: Effort normal and breath sounds normal. No respiratory distress.  Abdominal: Soft. There is no tenderness.  Musculoskeletal: Normal range of motion. He exhibits no edema or tenderness.  Lymphadenopathy:    He has cervical adenopathy.  Neurological: He is alert and oriented to person, place, and time. No sensory deficit. He exhibits normal muscle tone.  Skin: Skin is warm and dry. Capillary refill takes less than 2 seconds. No rash noted.  Psychiatric: He has a normal mood and affect. His behavior is normal.  Vitals reviewed.    ASSESSMENT & PLAN: Jack Haynes was seen today for cough and sore throat.  Diagnoses and all orders for this visit:  Cough -     promethazine-codeine (PHENERGAN WITH CODEINE) 6.25-10 MG/5ML syrup; Take 5 mLs by mouth at bedtime as needed for  cough.  Sore throat -     predniSONE (DELTASONE) 20 MG tablet; Take 1 tablet (20 mg total) by mouth daily with breakfast for 5 days.  Lower respiratory infection -     azithromycin (ZITHROMAX) 250 MG tablet; Sig as indicated -     predniSONE (DELTASONE) 20 MG tablet; Take 1 tablet (20 mg total) by mouth daily with breakfast for 5 days.  Laryngitis -     predniSONE (DELTASONE) 20 MG tablet; Take 1 tablet (20 mg total) by mouth daily with breakfast for 5 days.    Patient Instructions       IF you received an x-ray today, you will receive an invoice from [facility]. Please contact  [facility] at [phone] with questions or concerns regarding your invoice.   IF you received labwork today, you will receive an invoice from [facility]. Please contact [facility] at [phone] with questions or concerns regarding your invoice.   Our billing staff will not be able to assist you with questions regarding bills from these companies.  You will be contacted with the lab results as soon as they are available. The fastest way to get your results is to activate your My Chart account. Instructions are located on the last page of this paperwork. If you have not heard from [place] regarding the results in 2 weeks, please contact this office.     Cough, Adult A cough helps to clear your throat and lungs. A cough may last only 2-3 weeks (acute), or it may last longer than 8 weeks (chronic). Many different things can cause a cough. A cough may be a sign of an illness or another medical condition. Follow these instructions at home:  Pay attention to any changes in your cough.  Take medicines only as told by your doctor. ? If you were prescribed an antibiotic medicine, take it as told by your doctor. Do not stop taking it even if you start to feel better. ? Talk with your doctor before you try using a cough medicine.  Drink enough fluid to keep your pee (urine) clear or pale  yellow.  If the air is dry, use a cold steam vaporizer or humidifier in your home.  Stay away from things that make you cough at work or at home.  If your cough is worse at night, try using extra pillows to raise your head up higher while you sleep.  Do not smoke, and try not to be around smoke. If you need help quitting, ask your doctor.  Do not have caffeine.  Do not drink alcohol.  Rest as needed. Contact a doctor if:  You have new problems (symptoms).  You cough up yellow fluid (pus).  Your cough does not get better after 2-3 weeks, or your cough gets worse.  Medicine does not help your cough and you are not sleeping well.  You have pain that gets worse or pain that is not helped with medicine.  You have a fever.  You are losing weight and you do not know why.  You have night sweats. Get help right away if:  You cough up blood.  You have trouble breathing.  Your heartbeat is very fast. This information is not intended to replace advice given to you by your health care provider. Make sure you discuss any questions you have with your health care provider. Document Released: 10/15/2010 Document Revised: 07/10/2015 Document Reviewed: 04/10/2014 Elsevier Interactive Patient Education  2018 Elsevier Inc.      Agustina Caroli, MD [facility]

## 2017-03-23 NOTE — Patient Instructions (Addendum)
     IF you received an x-ray today, you will receive an invoice from [facility]. Please contact  Radiology at [phone] with questions or concerns regarding your invoice.   IF you received labwork today, you will receive an invoice from [facility]. Please contact [facility] at [phone] with questions or concerns regarding your invoice.   Our billing staff will not be able to assist you with questions regarding bills from these companies.  You will be contacted with the lab results as soon as they are available. The fastest way to get your results is to activate your My Chart account. Instructions are located on the last page of this paperwork. If you have not heard from us regarding the results in 2 weeks, please contact this office.     Cough, Adult A cough helps to clear your throat and lungs. A cough may last only 2-3 weeks (acute), or it may last longer than 8 weeks (chronic). Many different things can cause a cough. A cough may be a sign of an illness or another medical condition. Follow these instructions at home:  Pay attention to any changes in your cough.  Take medicines only as told by your doctor. ? If you were prescribed an antibiotic medicine, take it as told by your doctor. Do not stop taking it even if you start to feel better. ? Talk with your doctor before you try using a cough medicine.  Drink enough fluid to keep your pee (urine) clear or pale yellow.  If the air is dry, use a cold steam vaporizer or humidifier in your home.  Stay away from things that make you cough at work or at home.  If your cough is worse at night, try using extra pillows to raise your head up higher while you sleep.  Do not smoke, and try not to be around smoke. If you need help quitting, ask your doctor.  Do not have caffeine.  Do not drink alcohol.  Rest as needed. Contact a doctor if:  You have new problems (symptoms).  You cough up yellow fluid  (pus).  Your cough does not get better after 2-3 weeks, or your cough gets worse.  Medicine does not help your cough and you are not sleeping well.  You have pain that gets worse or pain that is not helped with medicine.  You have a fever.  You are losing weight and you do not know why.  You have night sweats. Get help right away if:  You cough up blood.  You have trouble breathing.  Your heartbeat is very fast. This information is not intended to replace advice given to you by your health care provider. Make sure you discuss any questions you have with your health care provider. Document Released: 10/15/2010 Document Revised: 07/10/2015 Document Reviewed: 04/10/2014 Elsevier Interactive Patient Education  2018 Elsevier Inc.  

## 2017-04-01 ENCOUNTER — Other Ambulatory Visit: Payer: Self-pay

## 2017-04-01 ENCOUNTER — Ambulatory Visit: Payer: Medicare PPO | Admitting: Emergency Medicine

## 2017-04-01 ENCOUNTER — Encounter: Payer: Self-pay | Admitting: Emergency Medicine

## 2017-04-01 VITALS — BP 130/74 | HR 91 | Temp 98.2°F | Resp 16 | Ht 68.0 in | Wt 193.6 lb

## 2017-04-01 DIAGNOSIS — H6692 Otitis media, unspecified, left ear: Secondary | ICD-10-CM | POA: Insufficient documentation

## 2017-04-01 DIAGNOSIS — H938X2 Other specified disorders of left ear: Secondary | ICD-10-CM | POA: Insufficient documentation

## 2017-04-01 MED ORDER — AMOXICILLIN-POT CLAVULANATE 875-125 MG PO TABS: 1.0000 | ORAL_TABLET | Freq: Two times a day (BID) | ORAL | 0 refills | Status: AC

## 2017-04-01 MED ORDER — HYDROCORTISONE-ACETIC ACID 1-2 % OT SOLN: 3.0000 [drp] | Freq: Three times a day (TID) | OTIC | 1 refills | Status: DC

## 2017-04-01 NOTE — Progress Notes (Signed)
Jack Haynes 77 y.o.   Chief Complaint  Patient presents with  . ears    clogged, started on 03/23/17/ per pt since last vs    HISTORY OF PRESENT ILLNESS: This is a 77 y.o. male complaining of fullness to the left ear for several days with occasional discomfort.  No other significant symptoms.  HPI   Prior to Admission medications   Medication Sig Start Date End Date Taking? Authorizing Provider  amoxicillin (AMOXIL) 500 MG capsule Take 4 capsules by mouth as needed. One hour prior to dental work 01/27/15  Yes [provider]  carboxymethylcellulose (REFRESH PLUS) 0.5 % SOLN Place 1 drop into the left eye daily.   Yes [provider]  naproxen sodium (ANAPROX) 220 MG tablet Take 220 mg by mouth as needed.    Yes [provider]  meclizine (ANTIVERT) 25 MG tablet Take 1 tablet (25 mg total) by mouth 3 (three) times daily as needed for dizziness. Patient not taking: Reported on 04/01/2017 03/12/16   Jack Agreste, MD    No Known Allergies  Patient Active Problem List   Diagnosis Date Noted  . Cough 03/23/2017  . Sore throat 03/23/2017  . Lower respiratory infection 03/23/2017  . Laryngitis 03/23/2017  . Abnormal EKG 01/02/2015  . Prostate cancer ([place]) 11/01/2011    Past Medical History:  Diagnosis Date  . Arthritis   . Cancer ([place])    hx of prostatae cancer    Past Surgical History:  Procedure Laterality Date  . APPENDECTOMY    . BACK SURGERY  1998  . EYE SURGERY Left 1965  . JOINT REPLACEMENT Left 2010   left knee replacement  . KNEE SURGERY    . PROSTATE SURGERY    . SPINE SURGERY    . TOTAL KNEE ARTHROPLASTY  02/01/2012   Procedure: TOTAL KNEE ARTHROPLASTY;  Surgeon: Jack Cabal, MD;  Location: [facility];  Service: Orthopedics;  Laterality: Left;    Social History   Socioeconomic History  . Marital status: Divorced    Spouse name: Not on file  . Number of children: 2  . Years of education: 15  . Highest education level:  Not on file  Social Needs  . Financial resource strain: Not on file  . Food insecurity - worry: Not on file  . Food insecurity - inability: Not on file  . Transportation needs - medical: Not on file  . Transportation needs - non-medical: Not on file  Occupational History  . 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   Tobacco Use  . Smoking status: Former Smoker    Last attempt to quit: 02/15/1981    Years since quitting: 36.1  . Smokeless tobacco: Current User    Types: Chew  Substance and Sexual Activity  . Alcohol use: Yes    Alcohol/week: 3.0 oz    Types: 5 Shots of liquor per week    Comment:  beer or mixed drink  . Drug use: No  . Sexual activity: Not on file  Other Topics Concern  . Not on file  Social History Narrative  . Not on file    Family History  Problem Relation Age of Onset  . Cancer Sister        brain tumor  . Heart disease Brother   . Alzheimer's disease Brother   . Heart disease Sister   . Alzheimer's disease Father   . Hypertension Sister   . Colon cancer Neg Hx   .  Colon polyps Neg Hx   . Esophageal cancer Neg Hx   . Rectal cancer Neg Hx   . Stomach cancer Neg Hx      Review of Systems  Constitutional: Negative.  Negative for chills and fever.  HENT: Positive for congestion and ear pain. Negative for sore throat.   Eyes: Negative.  Negative for discharge and redness.  Respiratory: Negative.  Negative for cough and shortness of breath.   Cardiovascular: Negative.  Negative for chest pain and palpitations.  Gastrointestinal: Negative.  Negative for nausea and vomiting.  Genitourinary: Negative.   Musculoskeletal: Negative for back pain, myalgias and neck pain.  Skin: Negative.  Negative for rash.  Neurological: Negative.  Negative for dizziness and headaches.  Endo/Heme/Allergies: Negative.   All other systems reviewed and are negative.   Vitals:   04/01/17 1047  BP: 130/74  Pulse: 91  Resp: 16  Temp: 98.2 F (36.8 C)    SpO2: 97%    Physical Exam  Constitutional: He is oriented to person, place, and time. He appears well-developed and well-nourished.  HENT:  Head: Normocephalic and atraumatic.  Left Ear: There is swelling. Tympanic membrane is injected.  Nose: Nose normal.  Mouth/Throat: Oropharynx is clear and moist.  Eyes: Conjunctivae and EOM are normal. Pupils are equal, round, and reactive to light.  Neck: Normal range of motion. Neck supple.  Cardiovascular: Normal rate, regular rhythm and normal heart sounds.  Pulmonary/Chest: Effort normal and breath sounds normal.  Musculoskeletal: Normal range of motion.  Lymphadenopathy:    He has no cervical adenopathy.  Neurological: He is alert and oriented to person, place, and time.  Skin: Skin is warm and dry. Capillary refill takes less than 2 seconds.  Psychiatric: He has a normal mood and affect. His behavior is normal.  Vitals reviewed.  A total of 25 minutes was spent in the room with the patient, greater than 50% of which was in counseling/coordination of care.  ASSESSMENT & PLAN:  Jack Haynes was seen today for ears.  Diagnoses and all orders for this visit:  Ear fullness, left -     amoxicillin-clavulanate (AUGMENTIN) 875-125 MG tablet; Take 1 tablet by mouth 2 (two) times daily for 7 days. -     acetic acid-hydrocortisone (VOSOL-HC) OTIC solution; Place 3 drops into the left ear 3 (three) times daily.  Left otitis media, unspecified otitis media type -     amoxicillin-clavulanate (AUGMENTIN) 875-125 MG tablet; Take 1 tablet by mouth 2 (two) times daily for 7 days. -     acetic acid-hydrocortisone (VOSOL-HC) OTIC solution; Place 3 drops into the left ear 3 (three) times daily.    Patient Instructions       IF you received an x-ray today, you will receive an invoice from [facility]. Please contact [facility] at [phone] with questions or concerns regarding your invoice.   IF you received labwork today,  you will receive an invoice from [facility]. Please contact [facility] at [phone] with questions or concerns regarding your invoice.   Our billing staff will not be able to assist you with questions regarding bills from these companies.  You will be contacted with the lab results as soon as they are available. The fastest way to get your results is to activate your My Chart account. Instructions are located on the last page of this paperwork. If you have not heard from [place] regarding the results in 2 weeks, please contact this office.     Otitis Media, Adult Otitis  media is redness, soreness, and puffiness (swelling) in the space just behind your eardrum (middle ear). It may be caused by allergies or infection. It often happens along with a cold. Follow these instructions at home:  Take your medicine as told. Finish it even if you start to feel better.  Only take over-the-counter or prescription medicines for pain, discomfort, or fever as told by your doctor.  Follow up with your doctor as told. Contact a doctor if:  You have otitis media only in one ear, or bleeding from your nose, or both.  You notice a lump on your neck.  You are not getting better in 3-5 days.  You feel worse instead of better. Get help right away if:  You have pain that is not helped with medicine.  You have puffiness, redness, or pain around your ear.  You get a stiff neck.  You cannot move part of your face (paralysis).  You notice that the bone behind your ear hurts when you touch it. This information is not intended to replace advice given to you by your health care provider. Make sure you discuss any questions you have with your health care provider. Document Released: 07/21/2007 Document Revised: 07/10/2015 Document Reviewed: 08/29/2012 Elsevier Interactive Patient Education  2017 Elsevier Inc.     Agustina Caroli, MD [facility]

## 2017-04-01 NOTE — Patient Instructions (Addendum)
     IF you received an x-ray today, you will receive an invoice from [facility]. Please contact [facility] at [phone] with questions or concerns regarding your invoice.   IF you received labwork today, you will receive an invoice from [facility]. Please contact [facility] at [phone] with questions or concerns regarding your invoice.   Our billing staff will not be able to assist you with questions regarding bills from these companies.  You will be contacted with the lab results as soon as they are available. The fastest way to get your results is to activate your My Chart account. Instructions are located on the last page of this paperwork. If you have not heard from [place] regarding the results in 2 weeks, please contact this office.     Otitis Media, Adult Otitis media is redness, soreness, and puffiness (swelling) in the space just behind your eardrum (middle ear). It may be caused by allergies or infection. It often happens along with a cold. Follow these instructions at home:  Take your medicine as told. Finish it even if you start to feel better.  Only take over-the-counter or prescription medicines for pain, discomfort, or fever as told by your doctor.  Follow up with your doctor as told. Contact a doctor if:  You have otitis media only in one ear, or bleeding from your nose, or both.  You notice a lump on your neck.  You are not getting better in 3-5 days.  You feel worse instead of better. Get help right away if:  You have pain that is not helped with medicine.  You have puffiness, redness, or pain around your ear.  You get a stiff neck.  You cannot move part of your face (paralysis).  You notice that the bone behind your ear hurts when you touch it. This information is not intended to replace advice given to you by your health care provider. Make sure you discuss any questions you have with your health care provider. Document Released:  07/21/2007 Document Revised: 07/10/2015 Document Reviewed: 08/29/2012 Elsevier Interactive Patient Education  2017 [employer].

## 2017-05-04 DIAGNOSIS — J310 Chronic rhinitis: Secondary | ICD-10-CM | POA: Diagnosis not present

## 2017-05-04 DIAGNOSIS — H6123 Impacted cerumen, bilateral: Secondary | ICD-10-CM | POA: Diagnosis not present

## 2017-05-04 DIAGNOSIS — H6993 Unspecified Eustachian tube disorder, bilateral: Secondary | ICD-10-CM | POA: Diagnosis not present

## 2017-05-17 DIAGNOSIS — H40013 Open angle with borderline findings, low risk, bilateral: Secondary | ICD-10-CM | POA: Diagnosis not present

## 2017-05-17 DIAGNOSIS — H40053 Ocular hypertension, bilateral: Secondary | ICD-10-CM | POA: Diagnosis not present

## 2017-05-17 DIAGNOSIS — H02842 Edema of right lower eyelid: Secondary | ICD-10-CM | POA: Diagnosis not present

## 2017-06-21 DIAGNOSIS — H40011 Open angle with borderline findings, low risk, right eye: Secondary | ICD-10-CM | POA: Diagnosis not present

## 2017-06-21 DIAGNOSIS — H40051 Ocular hypertension, right eye: Secondary | ICD-10-CM | POA: Diagnosis not present

## 2017-08-01 DIAGNOSIS — H40029 Open angle with borderline findings, high risk, unspecified eye: Secondary | ICD-10-CM | POA: Diagnosis not present

## 2017-08-01 DIAGNOSIS — H40053 Ocular hypertension, bilateral: Secondary | ICD-10-CM | POA: Diagnosis not present

## 2017-09-15 DIAGNOSIS — M545 Low back pain: Secondary | ICD-10-CM | POA: Diagnosis not present

## 2017-11-04 DIAGNOSIS — L910 Hypertrophic scar: Secondary | ICD-10-CM | POA: Diagnosis not present

## 2017-11-04 DIAGNOSIS — L812 Freckles: Secondary | ICD-10-CM | POA: Diagnosis not present

## 2017-11-04 DIAGNOSIS — C44311 Basal cell carcinoma of skin of nose: Secondary | ICD-10-CM | POA: Diagnosis not present

## 2017-11-04 DIAGNOSIS — Z85828 Personal history of other malignant neoplasm of skin: Secondary | ICD-10-CM | POA: Diagnosis not present

## 2017-11-04 DIAGNOSIS — L905 Scar conditions and fibrosis of skin: Secondary | ICD-10-CM | POA: Diagnosis not present

## 2017-11-04 DIAGNOSIS — D1801 Hemangioma of skin and subcutaneous tissue: Secondary | ICD-10-CM | POA: Diagnosis not present

## 2017-11-04 DIAGNOSIS — D485 Neoplasm of uncertain behavior of skin: Secondary | ICD-10-CM | POA: Diagnosis not present

## 2017-11-18 DIAGNOSIS — H40023 Open angle with borderline findings, high risk, bilateral: Secondary | ICD-10-CM | POA: Diagnosis not present

## 2017-11-18 DIAGNOSIS — H25013 Cortical age-related cataract, bilateral: Secondary | ICD-10-CM | POA: Diagnosis not present

## 2017-11-18 DIAGNOSIS — H2513 Age-related nuclear cataract, bilateral: Secondary | ICD-10-CM | POA: Diagnosis not present

## 2017-11-18 DIAGNOSIS — H35033 Hypertensive retinopathy, bilateral: Secondary | ICD-10-CM | POA: Diagnosis not present

## 2017-12-07 DIAGNOSIS — C44311 Basal cell carcinoma of skin of nose: Secondary | ICD-10-CM | POA: Diagnosis not present

## 2017-12-07 DIAGNOSIS — Z85828 Personal history of other malignant neoplasm of skin: Secondary | ICD-10-CM | POA: Diagnosis not present

## 2017-12-21 ENCOUNTER — Ambulatory Visit (INDEPENDENT_AMBULATORY_CARE_PROVIDER_SITE_OTHER): Payer: Medicare PPO

## 2017-12-21 DIAGNOSIS — Z23 Encounter for immunization: Secondary | ICD-10-CM

## 2017-12-21 NOTE — Progress Notes (Signed)
OK'D THE REGULAR DOSE FLU. HIGH DOSE OUT OF STOCK

## 2017-12-21 NOTE — Addendum Note (Signed)
Addended by: Amalia Hailey on: 12/21/2017 08:22 AM   Modules accepted: Orders

## 2018-01-24 DIAGNOSIS — Z6827 Body mass index (BMI) 27.0-27.9, adult: Secondary | ICD-10-CM | POA: Diagnosis not present

## 2018-01-24 DIAGNOSIS — E663 Overweight: Secondary | ICD-10-CM | POA: Diagnosis not present

## 2018-01-24 DIAGNOSIS — M159 Polyosteoarthritis, unspecified: Secondary | ICD-10-CM | POA: Diagnosis not present

## 2018-01-24 DIAGNOSIS — Z008 Encounter for other general examination: Secondary | ICD-10-CM | POA: Diagnosis not present

## 2018-06-15 DIAGNOSIS — L910 Hypertrophic scar: Secondary | ICD-10-CM | POA: Diagnosis not present

## 2018-06-15 DIAGNOSIS — Z85828 Personal history of other malignant neoplasm of skin: Secondary | ICD-10-CM | POA: Diagnosis not present

## 2018-07-25 DIAGNOSIS — N5231 Erectile dysfunction following radical prostatectomy: Secondary | ICD-10-CM | POA: Diagnosis not present

## 2018-07-25 DIAGNOSIS — Z8546 Personal history of malignant neoplasm of prostate: Secondary | ICD-10-CM | POA: Diagnosis not present

## 2018-07-28 ENCOUNTER — Encounter: Payer: Self-pay | Admitting: Emergency Medicine

## 2018-07-28 DIAGNOSIS — H1045 Other chronic allergic conjunctivitis: Secondary | ICD-10-CM | POA: Diagnosis not present

## 2018-07-28 DIAGNOSIS — H1859 Other hereditary corneal dystrophies: Secondary | ICD-10-CM | POA: Diagnosis not present

## 2018-07-28 DIAGNOSIS — H40023 Open angle with borderline findings, high risk, bilateral: Secondary | ICD-10-CM | POA: Diagnosis not present

## 2018-07-28 DIAGNOSIS — H04123 Dry eye syndrome of bilateral lacrimal glands: Secondary | ICD-10-CM | POA: Diagnosis not present

## 2018-08-14 ENCOUNTER — Other Ambulatory Visit: Payer: Self-pay

## 2018-08-14 ENCOUNTER — Ambulatory Visit
Admission: EM | Admit: 2018-08-14 | Discharge: 2018-08-14 | Disposition: A | Discharge status: Home / Self Care | Payer: Medicare PPO

## 2018-08-14 ENCOUNTER — Encounter: Payer: Self-pay | Admitting: Emergency Medicine

## 2018-08-14 DIAGNOSIS — H8110 Benign paroxysmal vertigo, unspecified ear: Secondary | ICD-10-CM | POA: Diagnosis not present

## 2018-08-14 MED ORDER — MECLIZINE HCL 25 MG PO TABS: 25.0000 mg | ORAL_TABLET | Freq: Three times a day (TID) | ORAL | 0 refills | Status: DC | PRN

## 2018-08-14 NOTE — ED Notes (Signed)
Patient able to ambulate independently  

## 2018-08-14 NOTE — ED Provider Notes (Signed)
[facility]    CSN: [id number] Arrival date & time: 08/14/18  1011      History   Chief Complaint Chief Complaint  Patient presents with  . Dizziness  . decreased hearing    HPI Jack Haynes is a 78 y.o. male.   The history is provided by the patient. No language interpreter was used.  Dizziness Quality:  Vertigo Severity:  Moderate Onset quality:  Gradual Duration:  2 days Timing:  Constant Progression:  Unchanged Chronicity:  New Relieved by:  Nothing Worsened by:  Nothing Ineffective treatments:  None tried Associated symptoms: no nausea   Risk factors: hx of vertigo     Past Medical History:  Diagnosis Date  . Arthritis   . Cancer ([place])    hx of prostatae cancer    Patient Active Problem List   Diagnosis Date Noted  . Ear fullness, left 04/01/2017  . Left otitis media 04/01/2017  . Cough 03/23/2017  . Sore throat 03/23/2017  . Lower respiratory infection 03/23/2017  . Laryngitis 03/23/2017  . Abnormal EKG 01/02/2015  . Prostate cancer ([place]) 11/01/2011    Past Surgical History:  Procedure Laterality Date  . APPENDECTOMY    . BACK SURGERY  1998  . EYE SURGERY Left 1965  . JOINT REPLACEMENT Left 2010   left knee replacement  . KNEE SURGERY    . PROSTATE SURGERY    . SPINE SURGERY    . TOTAL KNEE ARTHROPLASTY  02/01/2012   Procedure: TOTAL KNEE ARTHROPLASTY;  Surgeon: Sydnee Cabal, MD;  Location: [facility];  Service: Orthopedics;  Laterality: Left;       Home Medications    Prior to Admission medications   Medication Sig Start Date End Date Taking? Authorizing Provider  Multiple Vitamin (MULTIVITAMIN) tablet Take 1 tablet by mouth daily.   Yes [provider]  acetic acid-hydrocortisone (VOSOL-HC) OTIC solution Place 3 drops into the left ear 3 (three) times daily. 04/01/17   [name], MD  carboxymethylcellulose (REFRESH PLUS) 0.5 % SOLN Place 1 drop into the left eye daily.    [provider]   meclizine (ANTIVERT) 25 MG tablet Take 1 tablet (25 mg total) by mouth 3 (three) times daily as needed for dizziness. 08/14/18   Fransico Meadow, PA-C  naproxen sodium (ANAPROX) 220 MG tablet Take 220 mg by mouth as needed.     [provider]    Family History Family History  Problem Relation Age of Onset  . Cancer Sister        brain tumor  . Heart disease Brother   . Alzheimer's disease Brother   . Heart disease Sister   . Alzheimer's disease Father   . Hypertension Sister   . Colon cancer Neg Hx   . Colon polyps Neg Hx   . Esophageal cancer Neg Hx   . Rectal cancer Neg Hx   . Stomach cancer Neg Hx     Social History Social History   Tobacco Use  . Smoking status: Former Smoker    Quit date: 02/15/1981    Years since quitting: 37.5  . Smokeless tobacco: Current User    Types: Chew  Substance Use Topics  . Alcohol use: Yes    Alcohol/week: 5.0 standard drinks    Types: 5 Shots of liquor per week    Comment:  beer or mixed drink  . Drug use: No     Allergies   Patient has no known allergies.  Review of Systems Review of Systems  Gastrointestinal: Negative for nausea.  Neurological: Positive for dizziness.  All other systems reviewed and are negative.    Physical Exam Triage Vital Signs ED Triage Vitals  Enc Vitals Group     BP 08/14/18 1020 (!) 157/91     Pulse Rate 08/14/18 1020 79     Resp 08/14/18 1020 18     Temp 08/14/18 1020 (!) 97.5 F (36.4 C)     Temp Source 08/14/18 1020 Oral     SpO2 08/14/18 1020 94 %     Weight --      Height --      Head Circumference --      Peak Flow --      Pain Score 08/14/18 1021 0     Pain Loc --      Pain Edu? --      Excl. in [place]? --    No data found.  Updated Vital Signs BP (!) 157/91 (BP Location: Right Arm)   Pulse 79   Temp (!) 97.5 F (36.4 C) (Oral)   Resp 18   SpO2 94%   Visual Acuity Right Eye Distance:   Left Eye Distance:   Bilateral Distance:    Right Eye Near:   Left Eye  Near:    Bilateral Near:     Physical Exam Vitals signs and nursing note reviewed.  Constitutional:      Appearance: He is well-developed.  HENT:     Head: Normocephalic and atraumatic.     Right Ear: Tympanic membrane normal.     Nose: Nose normal.     Mouth/Throat:     Mouth: Mucous membranes are moist.  Eyes:     Conjunctiva/sclera: Conjunctivae normal.  Neck:     Musculoskeletal: Neck supple.  Cardiovascular:     Rate and Rhythm: Normal rate and regular rhythm.     Heart sounds: No murmur.  Pulmonary:     Effort: Pulmonary effort is normal. No respiratory distress.     Breath sounds: Normal breath sounds.  Abdominal:     Palpations: Abdomen is soft.     Tenderness: There is no abdominal tenderness.  Skin:    General: Skin is warm and dry.  Neurological:     General: No focal deficit present.     Mental Status: He is alert.  Psychiatric:        Mood and Affect: Mood normal.      UC Treatments / Results  Labs (all labs ordered are listed, but only abnormal results are displayed) Labs Reviewed - No data to display  EKG None  Radiology No results found.  Procedures Procedures (including critical care time)  Medications Ordered in UC Medications - No data to display  Initial Impression / Assessment and Plan / UC Course  I have reviewed the triage vital signs and the nursing notes.  Pertinent labs & imaging results that were available during my care of the patient were reviewed by me and considered in my medical decision making (see chart for details).     MDM  Pt reports he has had the same symptoms in the past.  Symptoms resolved with antivert.   Pt given rx.  Pt advised of need for further evaluation if symptoms persist  Final Clinical Impressions(s) / UC Diagnoses   Final diagnoses:  Benign paroxysmal positional vertigo, unspecified laterality     Discharge Instructions     Return if any problems.  ED Prescriptions    Medication Sig  Dispense Auth. Provider   meclizine (ANTIVERT) 25 MG tablet Take 1 tablet (25 mg total) by mouth 3 (three) times daily as needed for dizziness. 30 tablet Fransico Meadow, [place]     Controlled Substance Prescriptions [place] Controlled Substance Registry consulted? Not Applicable  An After Visit Summary was printed and given to the patient.    Fransico Meadow, [place] 08/14/18 1041

## 2018-08-14 NOTE — Discharge Instructions (Signed)
Return if any problems.

## 2018-08-14 NOTE — ED Triage Notes (Signed)
Pt presents to [facility] for assessment of 2 weeks of dizziness. HX of vertigo.  Pt states Antivert helped him before.  ALso c/o bilateral decreased hearing.  Pt has tried Dramamine with some mild relief.

## 2018-09-19 ENCOUNTER — Encounter: Payer: Self-pay | Admitting: Emergency Medicine

## 2018-09-19 ENCOUNTER — Ambulatory Visit (INDEPENDENT_AMBULATORY_CARE_PROVIDER_SITE_OTHER): Payer: Medicare PPO | Admitting: Emergency Medicine

## 2018-09-19 ENCOUNTER — Other Ambulatory Visit: Payer: Self-pay

## 2018-09-19 ENCOUNTER — Ambulatory Visit (INDEPENDENT_AMBULATORY_CARE_PROVIDER_SITE_OTHER): Payer: Medicare PPO

## 2018-09-19 VITALS — BP 136/75 | HR 64 | Temp 98.3°F | Resp 16 | Ht 68.0 in | Wt 186.0 lb

## 2018-09-19 DIAGNOSIS — R197 Diarrhea, unspecified: Secondary | ICD-10-CM | POA: Diagnosis not present

## 2018-09-19 DIAGNOSIS — Z23 Encounter for immunization: Secondary | ICD-10-CM | POA: Diagnosis not present

## 2018-09-19 DIAGNOSIS — R109 Unspecified abdominal pain: Secondary | ICD-10-CM | POA: Diagnosis not present

## 2018-09-19 DIAGNOSIS — K3189 Other diseases of stomach and duodenum: Secondary | ICD-10-CM | POA: Diagnosis not present

## 2018-09-19 DIAGNOSIS — K599 Functional intestinal disorder, unspecified: Secondary | ICD-10-CM | POA: Diagnosis not present

## 2018-09-19 LAB — POCT CBC
Granulocyte percent: 64.2 %G (ref 37–80)
HCT, POC: 45.2 % — AB (ref 29–41)
Hemoglobin: 15.8 g/dL — AB (ref 11–14.6)
Lymph, poc: 2 (ref 0.6–3.4)
MCH, POC: 31.2 pg (ref 27–31.2)
MCHC: 34.9 g/dL (ref 31.8–35.4)
MCV: 89.5 fL (ref 76–111)
MID (cbc): 0.2 (ref 0–0.9)
MPV: 6.5 fL (ref 0–99.8)
POC Granulocyte: 4.1 (ref 2–6.9)
POC LYMPH PERCENT: 32 %L (ref 10–50)
POC MID %: 3.8 %M (ref 0–12)
Platelet Count, POC: 197 10*3/uL (ref 142–424)
RBC: 5.06 M/uL (ref 4.69–6.13)
RDW, POC: 13 %
WBC: 6.4 10*3/uL (ref 4.6–10.2)

## 2018-09-19 MED ORDER — DICYCLOMINE HCL 10 MG PO CAPS: 10.0000 mg | ORAL_CAPSULE | Freq: Two times a day (BID) | ORAL | 1 refills | Status: DC

## 2018-09-19 NOTE — Progress Notes (Signed)
Jack Haynes 78 y.o.   Chief Complaint  Patient presents with   Diarrhea    per patient with a lot gas and bloating x 2 months off and on    HISTORY OF PRESENT ILLNESS: This is a 78 y.o. male complaining of 51-month history of intestinal symptoms.  It started with intolerance to greasy food followed by all kinds of foods.  Soup and crackers okay.  Noticed increased gas with "gurgling stomach" and intermittent diffuse intestinal colic.  Denies nausea or vomiting.  Denies rectal bleeding or melena.  Last colonoscopy 3 to 4 years ago normal.  Denies fever or flulike symptoms.  At times stools are formed and soft and other times it's just liquid.  No recent antibiotic use.  No significant change of lifestyle prior to onset of symptoms. Past medical history significant for appendectomy and prostatectomy. HPI   Prior to Admission medications   Medication Sig Start Date End Date Taking? Authorizing Provider  acetic acid-hydrocortisone (VOSOL-HC) OTIC solution Place 3 drops into the left ear 3 (three) times daily. 04/01/17  Yes Lataunya Ruud, Ines Bloomer, MD  carboxymethylcellulose (REFRESH PLUS) 0.5 % SOLN Place 1 drop into the left eye daily.   Yes [provider]  Multiple Vitamin (MULTIVITAMIN) tablet Take 1 tablet by mouth daily.   Yes [provider]  naproxen sodium (ANAPROX) 220 MG tablet Take 220 mg by mouth as needed.    Yes [provider]  meclizine (ANTIVERT) 25 MG tablet Take 1 tablet (25 mg total) by mouth 3 (three) times daily as needed for dizziness. Patient not taking: Reported on 09/19/2018 08/14/18   Fransico Meadow, PA-C    No Known Allergies  Patient Active Problem List   Diagnosis Date Noted   Abnormal EKG 01/02/2015   Prostate cancer ([place]) 11/01/2011    Past Medical History:  Diagnosis Date   Arthritis    Cancer ([place])    hx of prostatae cancer    Past Surgical History:  Procedure Laterality Date   [place]   JOINT REPLACEMENT Left 2010   left knee replacement   KNEE SURGERY     PROSTATE SURGERY     SPINE SURGERY     TOTAL KNEE ARTHROPLASTY  02/01/2012   Procedure: TOTAL KNEE ARTHROPLASTY;  Surgeon: Sydnee Cabal, MD;  Location: [facility];  Service: Orthopedics;  Laterality: Left;    Social History   Socioeconomic History   Marital status: Divorced    Spouse name: Not on file   Number of children: 2   Years of education: 12   Highest education level: Not on file  Occupational History   Occupation: sales    Comment: [employer]   Social Needs   Emergency planning/management officer strain: Not on file   Food insecurity    Worry: Not on file    Inability: Not on file   Transportation needs    Medical: Not on file    Non-medical: Not on file  Tobacco Use   Smoking status: Former Smoker    Quit date: 02/15/1981    Years since quitting: 37.6   Smokeless tobacco: Current User    Types: Chew  Substance and Sexual Activity   Alcohol use: Yes    Alcohol/week: 5.0 standard drinks    Types: 5 Shots of liquor per week    Comment:  beer or mixed drink   Drug use: No  Sexual activity: Not on file  Lifestyle   Physical activity    Days per week: Not on file    Minutes per session: Not on file   Stress: Not on file  Relationships   Social connections    Talks on phone: Not on file    Gets together: Not on file    Attends religious service: Not on file    Active member of club or organization: Not on file    Attends meetings of clubs or organizations: Not on file    Relationship status: Not on file   Intimate partner violence    Fear of current or ex partner: Not on file    Emotionally abused: Not on file    Physically abused: Not on file    Forced sexual activity: Not on file  Other Topics Concern   Not on file  Social History Narrative   Not on file    Family History  Problem Relation Age of Onset   Cancer Sister         brain tumor   Heart disease Brother    Alzheimer's disease Brother    Heart disease Sister    Alzheimer's disease Father    Hypertension Sister    Colon cancer Neg Hx    Colon polyps Neg Hx    Esophageal cancer Neg Hx    Rectal cancer Neg Hx    Stomach cancer Neg Hx      Review of Systems  Constitutional: Negative.  Negative for chills and fever.  HENT: Negative.  Negative for congestion and sore throat.   Eyes: Negative.   Respiratory: Negative.  Negative for cough and shortness of breath.   Cardiovascular: Negative.  Negative for chest pain and palpitations.  Gastrointestinal: Positive for abdominal pain and diarrhea. Negative for blood in stool, melena, nausea and vomiting.  Genitourinary: Negative.   Skin: Negative.  Negative for rash.  Neurological: Negative.  Negative for dizziness and headaches.  Endo/Heme/Allergies: Negative.   All other systems reviewed and are negative.   Vitals:   09/19/18 1105  BP: 136/75  Pulse: 64  Resp: 16  Temp: 98.3 F (36.8 C)  SpO2: 96%    Physical Exam Vitals signs reviewed.  Constitutional:      Appearance: Normal appearance.  HENT:     Head: Normocephalic and atraumatic.     Mouth/Throat:     Mouth: Mucous membranes are moist.     Pharynx: Oropharynx is clear.  Eyes:     Extraocular Movements: Extraocular movements intact.     Conjunctiva/sclera: Conjunctivae normal.     Pupils: Pupils are equal, round, and reactive to light.  Neck:     Musculoskeletal: Normal range of motion and neck supple.  Cardiovascular:     Rate and Rhythm: Normal rate and regular rhythm.     Pulses: Normal pulses.     Heart sounds: Normal heart sounds.  Pulmonary:     Effort: Pulmonary effort is normal.     Breath sounds: Normal breath sounds.  Abdominal:     General: Bowel sounds are normal. There is no distension.     Palpations: Abdomen is soft. There is no mass.     Tenderness: There is no abdominal tenderness. There is no  guarding.       Comments: Appendectomy and prostatectomy scars  Musculoskeletal: Normal range of motion.  Skin:    General: Skin is warm and dry.     Capillary Refill: Capillary refill takes less  than 2 seconds.  Neurological:     General: No focal deficit present.     Mental Status: He is alert and oriented to person, place, and time.  Psychiatric:        Mood and Affect: Mood normal.        Behavior: Behavior normal.    Dg Abd Acute 2+v W 1v Chest  Result Date: 09/19/2018 CLINICAL DATA:  Abdominal pain, diarrhea EXAM: DG ABDOMEN ACUTE W/ 1V CHEST COMPARISON:  None. FINDINGS: There is no evidence of dilated bowel loops or free intraperitoneal air. Mild to moderate volume of stool projects throughout the colon. No radiopaque calculi or other significant radiographic abnormality is seen. Numerous surgical clips within the low pelvis. Heart size and mediastinal contours are within normal limits. Both lungs are clear. IMPRESSION: 1. Nonspecific bowel gas pattern. 2. Mild to moderate colonic stool burden. 3. Lungs are clear. Electronically Signed   By: Davina Poke M.D.   On: 09/19/2018 11:47   Results for orders placed or performed in visit on 09/19/18 (from the past 24 hour(s))  POCT CBC     Status: Abnormal   Collection Time: 09/19/18 12:13 PM  Result Value Ref Range   WBC 6.4 4.6 - 10.2 K/uL   Lymph, poc 2.0 0.6 - 3.4   POC LYMPH PERCENT 32.0 10 - 50 %L   MID (cbc) 0.2 0 - 0.9   POC MID % 3.8 0 - 12 %M   POC Granulocyte 4.1 2 - 6.9   Granulocyte percent 64.2 37 - 80 %G   RBC 5.06 4.69 - 6.13 M/uL   Hemoglobin 15.8 (A) 11 - 14.6 g/dL   HCT, POC 45.2 (A) 29 - 41 %   MCV 89.5 76 - 111 fL   MCH, POC 31.2 27 - 31.2 pg   MCHC 34.9 31.8 - 35.4 g/dL   RDW, POC 13.0 %   Platelet Count, POC 197 142 - 424 K/uL   MPV 6.5 0 - 99.8 fL     ASSESSMENT & PLAN: Jack Haynes was seen today for diarrhea.  Diagnoses and all orders for this visit:  Diarrhea, unspecified type -     POCT  CBC -     Comprehensive metabolic panel -     Lipase -     DG ABD ACUTE 2+V W 1V CHEST; Future -     Ambulatory referral to Gastroenterology  Intestinal dysfunction -     Ambulatory referral to Gastroenterology  Need for prophylactic vaccination against Streptococcus pneumoniae (pneumococcus) -     Pneumococcal polysaccharide vaccine 23-valent greater than or equal to 2yo subcutaneous/IM  Spasm of gastrointestinal tract -     dicyclomine (BENTYL) 10 MG capsule; Take 1 capsule (10 mg total) by mouth 2 (two) times daily before a meal for 7 days.   Clinically stable.  No red flag signs or symptoms.  Normal vital signs.  Normal CBC.  Benign abdominal examination.  Normal x-rays without air-fluid levels or signs of bowel obstruction.  Needs follow-up with GI doctor and possible colonoscopy. ED precautions given.  Advised to contact the office if clinical picture changes or get worse.   Patient Instructions       If you have lab work done today you will be contacted with your lab results within the next 2 weeks.  If you have not heard from [place] then please contact us. The fastest way to get your results is to register for My Chart.   IF you received  an x-ray today, you will receive an invoice from [facility]. Please contact [facility] at [phone] with questions or concerns regarding your invoice.   IF you received labwork today, you will receive an invoice from [facility]. Please contact [facility] at [phone] with questions or concerns regarding your invoice.   Our billing staff will not be able to assist you with questions regarding bills from these companies.  You will be contacted with the lab results as soon as they are available. The fastest way to get your results is to activate your My Chart account. Instructions are located on the last page of this paperwork. If you have not heard from [place] regarding the results in 2 weeks, please contact this office.      Diarrhea, Adult Diarrhea is when you pass loose and watery poop (stool) often. Diarrhea can make you feel weak and cause you to lose water in your body (get dehydrated). Losing water in your body can cause you to:  Feel tired and thirsty.  Have a dry mouth.  Go pee (urinate) less often. Diarrhea often lasts 2-3 days. However, it can last longer if it is a sign of something more serious. It is important to treat your diarrhea as told by your doctor. Follow these instructions at home: Eating and drinking     Follow these instructions as told by your doctor:  Take an ORS (oral rehydration solution). This is a drink that helps you replace fluids and minerals your body lost. It is sold at pharmacies and stores.  Drink plenty of fluids, such as: ? Water. ? Ice chips. ? Diluted fruit juice. ? Low-calorie sports drinks. ? Milk, if you want.  Avoid drinking fluids that have a lot of sugar or caffeine in them.  Eat bland, easy-to-digest foods in small amounts as you are able. These foods include: ? Bananas. ? Applesauce. ? Rice. ? Low-fat (lean) meats. ? Toast. ? Crackers.  Avoid alcohol.  Avoid spicy or fatty foods.  Medicines  Take over-the-counter and prescription medicines only as told by your doctor.  If you were prescribed an antibiotic medicine, take it as told by your doctor. Do not stop using the antibiotic even if you start to feel better. General instructions   Wash your hands often using soap and water. If soap and water are not available, use a hand sanitizer. Others in your home should wash their hands as well. Hands should be washed: ? After using the toilet or changing a diaper. ? Before preparing, cooking, or serving food. ? While caring for a sick person. ? While visiting someone in a hospital.  Drink enough fluid to keep your pee (urine) pale yellow.  Rest at home while you get better.  Watch your condition for any changes.  Take a warm bath  to help with any burning or pain from having diarrhea.  Keep all follow-up visits as told by your doctor. This is important. Contact a doctor if:  You have a fever.  Your diarrhea gets worse.  You have new symptoms.  You cannot keep fluids down.  You feel light-headed or dizzy.  You have a headache.  You have muscle cramps. Get help right away if:  You have chest pain.  You feel very weak or you pass out (faint).  You have bloody or black poop or poop that looks like tar.  You have very bad pain, cramping, or bloating in your belly (abdomen).  You have trouble breathing or you are  breathing very quickly.  Your heart is beating very quickly.  Your skin feels cold and clammy.  You feel confused.  You have signs of losing too much water in your body, such as: ? Dark pee, very little pee, or no pee. ? Cracked lips. ? Dry mouth. ? Sunken eyes. ? Sleepiness. ? Weakness. Summary  Diarrhea is when you pass loose and watery poop (stool) often.  Diarrhea can make you feel weak and cause you to lose water in your body (get dehydrated).  Take an ORS (oral rehydration solution). This is a drink that is sold at pharmacies and stores.  Eat bland, easy-to-digest foods in small amounts as you are able.  Contact a doctor if your condition gets worse. Get help right away if you have signs that you have lost too much water in your body. This information is not intended to replace advice given to you by your health care provider. Make sure you discuss any questions you have with your health care provider. Document Released: 07/21/2007 Document Revised: 07/08/2017 Document Reviewed: 07/08/2017 Elsevier Patient Education  2020 [employer]      Agustina Caroli, MD [facility]

## 2018-09-19 NOTE — Patient Instructions (Addendum)
If you have lab work done today you will be contacted with your lab results within the next 2 weeks.  If you have not heard from [place] then please contact us. The fastest way to get your results is to register for My Chart.   IF you received an x-ray today, you will receive an invoice from [facility]. Please contact [facility] at [phone] with questions or concerns regarding your invoice.   IF you received labwork today, you will receive an invoice from [facility]. Please contact [facility] at [phone] with questions or concerns regarding your invoice.   Our billing staff will not be able to assist you with questions regarding bills from these companies.  You will be contacted with the lab results as soon as they are available. The fastest way to get your results is to activate your My Chart account. Instructions are located on the last page of this paperwork. If you have not heard from [place] regarding the results in 2 weeks, please contact this office.     Diarrhea, Adult Diarrhea is when you pass loose and watery poop (stool) often. Diarrhea can make you feel weak and cause you to lose water in your body (get dehydrated). Losing water in your body can cause you to:  Feel tired and thirsty.  Have a dry mouth.  Go pee (urinate) less often. Diarrhea often lasts 2-3 days. However, it can last longer if it is a sign of something more serious. It is important to treat your diarrhea as told by your doctor. Follow these instructions at home: Eating and drinking     Follow these instructions as told by your doctor:  Take an ORS (oral rehydration solution). This is a drink that helps you replace fluids and minerals your body lost. It is sold at pharmacies and stores.  Drink plenty of fluids, such as: ? Water. ? Ice chips. ? Diluted fruit juice. ? Low-calorie sports drinks. ? Milk, if you want.  Avoid drinking fluids that have a lot of sugar or caffeine in  them.  Eat bland, easy-to-digest foods in small amounts as you are able. These foods include: ? Bananas. ? Applesauce. ? Rice. ? Low-fat (lean) meats. ? Toast. ? Crackers.  Avoid alcohol.  Avoid spicy or fatty foods.  Medicines  Take over-the-counter and prescription medicines only as told by your doctor.  If you were prescribed an antibiotic medicine, take it as told by your doctor. Do not stop using the antibiotic even if you start to feel better. General instructions   Wash your hands often using soap and water. If soap and water are not available, use a hand sanitizer. Others in your home should wash their hands as well. Hands should be washed: ? After using the toilet or changing a diaper. ? Before preparing, cooking, or serving food. ? While caring for a sick person. ? While visiting someone in a hospital.  Drink enough fluid to keep your pee (urine) pale yellow.  Rest at home while you get better.  Watch your condition for any changes.  Take a warm bath to help with any burning or pain from having diarrhea.  Keep all follow-up visits as told by your doctor. This is important. Contact a doctor if:  You have a fever.  Your diarrhea gets worse.  You have new symptoms.  You cannot keep fluids down.  You feel light-headed or dizzy.  You have a headache.  You have muscle cramps. Get help  right away if:  You have chest pain.  You feel very weak or you pass out (faint).  You have bloody or black poop or poop that looks like tar.  You have very bad pain, cramping, or bloating in your belly (abdomen).  You have trouble breathing or you are breathing very quickly.  Your heart is beating very quickly.  Your skin feels cold and clammy.  You feel confused.  You have signs of losing too much water in your body, such as: ? Dark pee, very little pee, or no pee. ? Cracked lips. ? Dry mouth. ? Sunken eyes. ? Sleepiness. ? Weakness. Summary  Diarrhea  is when you pass loose and watery poop (stool) often.  Diarrhea can make you feel weak and cause you to lose water in your body (get dehydrated).  Take an ORS (oral rehydration solution). This is a drink that is sold at pharmacies and stores.  Eat bland, easy-to-digest foods in small amounts as you are able.  Contact a doctor if your condition gets worse. Get help right away if you have signs that you have lost too much water in your body. This information is not intended to replace advice given to you by your health care provider. Make sure you discuss any questions you have with your health care provider. Document Released: 07/21/2007 Document Revised: 07/08/2017 Document Reviewed: 07/08/2017 Elsevier Patient Education  2020 [employer].

## 2018-09-20 LAB — LIPASE: Lipase: 45 U/L (ref 13–78)

## 2018-09-20 LAB — COMPREHENSIVE METABOLIC PANEL WITH GFR
ALT: 17 IU/L (ref 0–44)
AST: 17 IU/L (ref 0–40)
Albumin/Globulin Ratio: 1.6 (ref 1.2–2.2)
Albumin: 4.1 g/dL (ref 3.7–4.7)
Alkaline Phosphatase: 64 IU/L (ref 39–117)
BUN/Creatinine Ratio: 11 (ref 10–24)
BUN: 11 mg/dL (ref 8–27)
Bilirubin Total: 0.4 mg/dL (ref 0.0–1.2)
CO2: 21 mmol/L (ref 20–29)
Calcium: 8.8 mg/dL (ref 8.6–10.2)
Chloride: 103 mmol/L (ref 96–106)
Creatinine, Ser: 1.03 mg/dL (ref 0.76–1.27)
GFR calc Af Amer: 81 mL/min/1.73
GFR calc non Af Amer: 70 mL/min/1.73
Globulin, Total: 2.5 g/dL (ref 1.5–4.5)
Glucose: 99 mg/dL (ref 65–99)
Potassium: 4 mmol/L (ref 3.5–5.2)
Sodium: 141 mmol/L (ref 134–144)
Total Protein: 6.6 g/dL (ref 6.0–8.5)

## 2018-09-20 NOTE — Progress Notes (Signed)
Pt has appt 9/3 with gastrointestinal doctor.

## 2018-10-01 DIAGNOSIS — Z20828 Contact with and (suspected) exposure to other viral communicable diseases: Secondary | ICD-10-CM | POA: Diagnosis not present

## 2018-10-02 ENCOUNTER — Encounter: Payer: Self-pay | Admitting: Emergency Medicine

## 2018-10-02 ENCOUNTER — Telehealth (INDEPENDENT_AMBULATORY_CARE_PROVIDER_SITE_OTHER): Payer: Medicare PPO | Admitting: Emergency Medicine

## 2018-10-02 ENCOUNTER — Other Ambulatory Visit: Payer: Self-pay

## 2018-10-02 DIAGNOSIS — Z20822 Contact with and (suspected) exposure to covid-19: Secondary | ICD-10-CM

## 2018-10-02 DIAGNOSIS — R197 Diarrhea, unspecified: Secondary | ICD-10-CM

## 2018-10-02 DIAGNOSIS — K599 Functional intestinal disorder, unspecified: Secondary | ICD-10-CM

## 2018-10-02 DIAGNOSIS — Z20828 Contact with and (suspected) exposure to other viral communicable diseases: Secondary | ICD-10-CM | POA: Diagnosis not present

## 2018-10-02 NOTE — Progress Notes (Signed)
Telemedicine Encounter- SOAP NOTE Established Patient  This telephone encounter was conducted with the patient's (or proxy's) verbal consent via audio telecommunications: yes/no: Yes Patient was instructed to have this encounter in a suitably private space; and to only have persons present to whom they give permission to participate. In addition, patient identity was confirmed by use of name plus two identifiers (DOB and address).  I discussed the limitations, risks, security and privacy concerns of performing an evaluation and management service by telephone and the availability of in person appointments. I also discussed with the patient that there may be a patient responsible charge related to this service. The patient expressed understanding and agreed to proceed.  I spent a total of TIME; 0 MIN TO 60 MIN: 15 minutes talking with the patient or their proxy.  No chief complaint on file. Follow-up of visit on 09/19/2018 and also has some COVID concerns  Subjective   Jack Haynes is a 78 y.o. male established patient. Telephone visit today for follow-up of visit on 09/19/2018.  He presented with diarrhea and intestinal dysfunction.  Prescribed Bentyl with diet modification.  Doing very well.  Symptoms much improved.  Scheduled appointment with GI doctor for October 19, 2018. Has some concerns about possible COVID exposure.  Daughter of a coworker recently tested positive.  He is asymptomatic.  He was tested at [facility] last Friday.  Waiting on results but remains asymptomatic.  HPI   Patient Active Problem List   Diagnosis Date Noted  . Abnormal EKG 01/02/2015  . Prostate cancer ([place]) 11/01/2011    Past Medical History:  Diagnosis Date  . Arthritis   . Cancer ([place])    hx of prostatae cancer    Current Outpatient Medications  Medication Sig Dispense Refill  . acetic acid-hydrocortisone (VOSOL-HC) OTIC solution Place 3 drops into the left ear 3 (three) times daily. 10 mL 1  .  carboxymethylcellulose (REFRESH PLUS) 0.5 % SOLN Place 1 drop into the left eye daily.    Marland Kitchen dicyclomine (BENTYL) 10 MG capsule Take 1 capsule (10 mg total) by mouth 2 (two) times daily before a meal for 7 days. 20 capsule 1  . meclizine (ANTIVERT) 25 MG tablet Take 1 tablet (25 mg total) by mouth 3 (three) times daily as needed for dizziness. (Patient not taking: Reported on 09/19/2018) 30 tablet 0  . Multiple Vitamin (MULTIVITAMIN) tablet Take 1 tablet by mouth daily.    . naproxen sodium (ANAPROX) 220 MG tablet Take 220 mg by mouth as needed.      No current facility-administered medications for this visit.     No Known Allergies  Social History   Socioeconomic History  . Marital status: Divorced    Spouse name: Not on file  . Number of children: 2  . Years of education: 30  . Highest education level: Not on file  Occupational History  . 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   Social Needs  . Financial resource strain: Not on file  . Food insecurity    Worry: Not on file    Inability: Not on file  . Transportation needs    Medical: Not on file    Non-medical: Not on file  Tobacco Use  . Smoking status: Former Smoker    Quit date: 02/15/1981    Years since quitting: 37.6  . Smokeless tobacco: Current User    Types: Chew  Substance and Sexual Activity  . Alcohol use: Yes  Alcohol/week: 5.0 standard drinks    Types: 5 Shots of liquor per week    Comment:  beer or mixed drink  . Drug use: No  . Sexual activity: Not on file  Lifestyle  . Physical activity    Days per week: Not on file    Minutes per session: Not on file  . Stress: Not on file  Relationships  . Social Herbalist on phone: Not on file    Gets together: Not on file    Attends religious service: Not on file    Active member of club or organization: Not on file    Attends meetings of clubs or organizations: Not on file    Relationship status: Not on file  . Intimate partner  violence    Fear of current or ex partner: Not on file    Emotionally abused: Not on file    Physically abused: Not on file    Forced sexual activity: Not on file  Other Topics Concern  . Not on file  Social History Narrative  . Not on file    Review of Systems  Constitutional: Negative.  Negative for chills and fever.  HENT: Negative.  Negative for congestion and sore throat.   Eyes: Negative.   Respiratory: Negative.  Negative for cough and shortness of breath.   Cardiovascular: Negative.  Negative for chest pain and palpitations.  Gastrointestinal: Negative.  Negative for abdominal pain, blood in stool, diarrhea, melena, nausea and vomiting.  Genitourinary: Negative.  Negative for dysuria.  Musculoskeletal: Negative.   Skin: Negative.  Negative for rash.  Neurological: Negative.  Negative for dizziness and headaches.  Endo/Heme/Allergies: Negative.   All other systems reviewed and are negative.   Objective  Alert and oriented x3 in no apparent respiratory distress. Vitals as reported by the patient: There were no vitals filed for this visit.  There are no diagnoses linked to this encounter.  Diagnoses and all orders for this visit:  Diarrhea, unspecified type Comments: Resolved  Intestinal dysfunction Comments: Much improved  Exposure to Covid-19 Virus    Clinically stable.  No medical concerns identified during this visit. COVID instructions given. Advised to call the office if clinical picture changes or he develops any symptoms.  I discussed the assessment and treatment plan with the patient. The patient was provided an opportunity to ask questions and all were answered. The patient agreed with the plan and demonstrated an understanding of the instructions.   The patient was advised to call back or seek an in-person evaluation if the symptoms worsen or if the condition fails to improve as anticipated.  I provided 15 minutes of non-face-to-face time during  this encounter.  [name], MD  Primary Care at [facility]

## 2018-10-02 NOTE — Progress Notes (Signed)
Pt is was recently exposed to an employee whose daughter tested positive of the virus. He is not having any symptoms. [facility], awaiting results, He has not other medical concerns at this time

## 2018-10-19 ENCOUNTER — Ambulatory Visit: Payer: Medicare PPO | Admitting: Internal Medicine

## 2018-11-23 DIAGNOSIS — H04123 Dry eye syndrome of bilateral lacrimal glands: Secondary | ICD-10-CM | POA: Diagnosis not present

## 2018-11-23 DIAGNOSIS — H40023 Open angle with borderline findings, high risk, bilateral: Secondary | ICD-10-CM | POA: Diagnosis not present

## 2018-11-23 DIAGNOSIS — H1045 Other chronic allergic conjunctivitis: Secondary | ICD-10-CM | POA: Diagnosis not present

## 2018-11-23 DIAGNOSIS — H2513 Age-related nuclear cataract, bilateral: Secondary | ICD-10-CM | POA: Diagnosis not present

## 2018-11-30 DIAGNOSIS — Z8546 Personal history of malignant neoplasm of prostate: Secondary | ICD-10-CM | POA: Diagnosis not present

## 2019-04-02 ENCOUNTER — Telehealth: Payer: Self-pay | Admitting: *Deleted

## 2019-04-02 NOTE — Telephone Encounter (Signed)
Please call [name] and schedule an appointment

## 2019-04-03 NOTE — Telephone Encounter (Signed)
What kind of appt.?

## 2019-04-04 ENCOUNTER — Ambulatory Visit (INDEPENDENT_AMBULATORY_CARE_PROVIDER_SITE_OTHER): Payer: Medicare PPO | Admitting: Emergency Medicine

## 2019-04-04 VITALS — BP 136/75 | Ht 68.0 in | Wt 186.0 lb

## 2019-04-04 DIAGNOSIS — Z0000 Encounter for general adult medical examination without abnormal findings: Secondary | ICD-10-CM

## 2019-04-04 NOTE — Progress Notes (Signed)
Presents today for [employer] Visit   Date of last exam:10/02/2018  Interpreter used for this visit? No  I connected with  Jack Haynes on 04/04/19 by a telephone and verified that I am speaking with the correct person using two identifiers.   I discussed the limitations of evaluation and management by telemedicine. The patient expressed understanding and agreed to proceed.    Patient Care Team: [name], MD as PCP - General (Internal Medicine)   Other items to address today:   Discussed Eye/Dental Discussed Immunizations     ADVANCE DIRECTIVES: Discussed: YES On File: NO Materials Provided: NO  Immunization status:  Immunization History  Administered Date(s) Administered  . Influenza Split 11/01/2011  . Influenza,inj,Quad PF,6+ Mos 11/20/2012, 11/22/2013, 12/24/2014, 11/18/2015, 11/22/2016, 12/21/2017  . Pneumococcal Conjugate-13 01/06/2010, 12/24/2014  . Pneumococcal Polysaccharide-23 09/19/2018  . Tdap 01/06/2010  . Zoster 02/16/2011     Health Maintenance Due  Topic Date Due  . INFLUENZA VACCINE  09/16/2018     Functional Status Survey: Is the patient deaf or have difficulty hearing?: No Does the patient have difficulty seeing, even when wearing glasses/contacts?: No Does the patient have difficulty concentrating, remembering, or making decisions?: No Does the patient have difficulty walking or climbing stairs?: No Does the patient have difficulty dressing or bathing?: No Does the patient have difficulty doing errands alone such as visiting a doctor's office or shopping?: No   6CIT Screen 04/04/2019  What Year? 0 points  What month? 0 points  What time? 0 points  Count back from 20 0 points  Months in reverse 0 points  Repeat phrase 0 points  Total Score 0        Clinical Support from 04/04/2019 in Primary Care at [facility]  AUDIT-C Score  6       Home Environment:   Live one story No trouble climbing  stairs No scattered rugs Yes grab bars Adequate lighting/no clutter  Timed Warm up N/A    Patient Active Problem List   Diagnosis Date Noted  . Abnormal EKG 01/02/2015  . Prostate cancer ([place]) 11/01/2011     Past Medical History:  Diagnosis Date  . Arthritis   . Cancer ([place])    hx of prostatae cancer     Past Surgical History:  Procedure Laterality Date  . APPENDECTOMY    . BACK SURGERY  1998  . EYE SURGERY Left 1965  . JOINT REPLACEMENT Left 2010   left knee replacement  . KNEE SURGERY    . PROSTATE SURGERY    . SPINE SURGERY    . TOTAL KNEE ARTHROPLASTY  02/01/2012   Procedure: TOTAL KNEE ARTHROPLASTY;  Surgeon: Sydnee Cabal, MD;  Location: [facility]S;  Service: Orthopedics;  Laterality: Left;     Family History  Problem Relation Age of Onset  . Cancer Sister        brain tumor  . Heart disease Brother   . Alzheimer's disease Brother   . Heart disease Sister   . Alzheimer's disease Father   . Hypertension Sister   . Colon cancer Neg Hx   . Colon polyps Neg Hx   . Esophageal cancer Neg Hx   . Rectal cancer Neg Hx   . Stomach cancer Neg Hx      Social History   Socioeconomic History  . Marital status: Divorced    Spouse name: Not on file  . Number of children: 2  . Years of education: 30  .  Highest education level: Not on file  Occupational History  . 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   Tobacco Use  . Smoking status: Former Smoker    Quit date: 02/15/1981    Years since quitting: 38.1  . Smokeless tobacco: Current User    Types: Chew  Substance and Sexual Activity  . Alcohol use: Yes    Alcohol/week: 5.0 standard drinks    Types: 5 Shots of liquor per week    Comment:  beer or mixed drink  . Drug use: No  . Sexual activity: Not on file  Other Topics Concern  . Not on file  Social History Narrative  . Not on file   Social Determinants of Health   Financial Resource Strain:   . Difficulty of Paying Living Expenses:  Not on file  Food Insecurity:   . Worried About Charity fundraiser in the Last Year: Not on file  . Ran Out of Food in the Last Year: Not on file  Transportation Needs:   . Lack of Transportation (Medical): Not on file  . Lack of Transportation (Non-Medical): Not on file  Physical Activity:   . Days of Exercise per Week: Not on file  . Minutes of Exercise per Session: Not on file  Stress:   . Feeling of Stress : Not on file  Social Connections:   . Frequency of Communication with Friends and Family: Not on file  . Frequency of Social Gatherings with Friends and Family: Not on file  . Attends Religious Services: Not on file  . Active Member of Clubs or Organizations: Not on file  . Attends Archivist Meetings: Not on file  . Marital Status: Not on file  Intimate Partner Violence:   . Fear of Current or Ex-Partner: Not on file  . Emotionally Abused: Not on file  . Physically Abused: Not on file  . Sexually Abused: Not on file     No Known Allergies   Prior to Admission medications   Medication Sig Start Date End Date Taking? Authorizing Provider  acetic acid-hydrocortisone (VOSOL-HC) OTIC solution Place 3 drops into the left ear 3 (three) times daily. Patient not taking: Reported on 04/04/2019 04/01/17   [name], MD  carboxymethylcellulose (REFRESH PLUS) 0.5 % SOLN Place 1 drop into the left eye daily.    [provider]  dicyclomine (BENTYL) 10 MG capsule Take 1 capsule (10 mg total) by mouth 2 (two) times daily before a meal for 7 days. 09/19/18 09/26/18  [name], MD  meclizine (ANTIVERT) 25 MG tablet Take 1 tablet (25 mg total) by mouth 3 (three) times daily as needed for dizziness. Patient not taking: Reported on 09/19/2018 08/14/18   Fransico Meadow, PA-C  Multiple Vitamin (MULTIVITAMIN) tablet Take 1 tablet by mouth daily.    [provider]  naproxen sodium (ANAPROX) 220 MG tablet Take 220 mg by mouth as needed.      [provider]     Depression screen [facility] 2/9 04/04/2019 10/02/2018 09/19/2018 04/01/2017 03/23/2017  Decreased Interest 0 0 0 0 0  Down, Depressed, Hopeless 0 0 0 0 0  PHQ - 2 Score 0 0 0 0 0     Fall Risk  04/04/2019 10/02/2018 09/19/2018 04/01/2017 03/23/2017  Falls in the past year? 0 0 0 No No  Number falls in past yr: 0 0 - - -  Injury with Fall? 0 0 - - -  Follow up  Falls evaluation completed - Falls evaluation completed - -      PHYSICAL EXAM: BP 136/75 Comment: taken from a previous visit  Ht 5\' 8"  (1.727 m)   Wt 186 lb (84.4 kg)   BMI 28.28 kg/m    Wt Readings from Last 3 Encounters:  04/04/19 186 lb (84.4 kg)  09/19/18 186 lb (84.4 kg)  04/01/17 193 lb 9.6 oz (87.8 kg)       Education/Counseling provided regarding diet and exercise, prevention of chronic diseases, smoking/tobacco cessation, if applicable, and reviewed "Covered Medicare Preventive Services."

## 2019-04-04 NOTE — Patient Instructions (Addendum)
Thank you for taking time to come for your Medicare Wellness Visit. I appreciate your ongoing commitment to your health goals. Please review the following plan we discussed and let me know if I can assist you in the future.  Jack Kennedy LPN  Preventive Care 79 Years and Older, Male Preventive care refers to lifestyle choices and visits with your health care provider that can promote health and wellness. This includes:  A yearly physical exam. This is also called an annual well check.  Regular dental and eye exams.  Immunizations.  Screening for certain conditions.  Healthy lifestyle choices, such as diet and exercise. What can I expect for my preventive care visit? Physical exam Your health care provider will check:  Height and weight. These may be used to calculate body mass index (BMI), which is a measurement that tells if you are at a healthy weight.  Heart rate and blood pressure.  Your skin for abnormal spots. Counseling Your health care provider may ask you questions about:  Alcohol, tobacco, and drug use.  Emotional well-being.  Home and relationship well-being.  Sexual activity.  Eating habits.  History of falls.  Memory and ability to understand (cognition).  Work and work Statistician. What immunizations do I need?  Influenza (flu) vaccine  This is recommended every year. Tetanus, diphtheria, and pertussis (Tdap) vaccine  You may need a Td booster every 10 years. Varicella (chickenpox) vaccine  You may need this vaccine if you have not already been vaccinated. Zoster (shingles) vaccine  You may need this after age 66. Pneumococcal conjugate (PCV13) vaccine  One dose is recommended after age 84. Pneumococcal polysaccharide (PPSV23) vaccine  One dose is recommended after age 88. Measles, mumps, and rubella (MMR) vaccine  You may need at least one dose of MMR if you were born in 1957 or later. You may also need a second dose. Meningococcal  conjugate (MenACWY) vaccine  You may need this if you have certain conditions. Hepatitis A vaccine  You may need this if you have certain conditions or if you travel or work in places where you may be exposed to hepatitis A. Hepatitis B vaccine  You may need this if you have certain conditions or if you travel or work in places where you may be exposed to hepatitis B. Haemophilus influenzae type b (Hib) vaccine  You may need this if you have certain conditions. You may receive vaccines as individual doses or as more than one vaccine together in one shot (combination vaccines). Talk with your health care provider about the risks and benefits of combination vaccines. What tests do I need? Blood tests  Lipid and cholesterol levels. These may be checked every 5 years, or more frequently depending on your overall health.  Hepatitis C test.  Hepatitis B test. Screening  Lung cancer screening. You may have this screening every year starting at age 24 if you have a 30-pack-year history of smoking and currently smoke or have quit within the past 15 years.  Colorectal cancer screening. All adults should have this screening starting at age 52 and continuing until age 61. Your health care provider may recommend screening at age 57 if you are at increased risk. You will have tests every 1-10 years, depending on your results and the type of screening test.  Prostate cancer screening. Recommendations will vary depending on your family history and other risks.  Diabetes screening. This is done by checking your blood sugar (glucose) after you have not eaten for  a while (fasting). You may have this done every 1-3 years.  Abdominal aortic aneurysm (AAA) screening. You may need this if you are a current or former smoker.  Sexually transmitted disease (STD) testing. Follow these instructions at home: Eating and drinking  Eat a diet that includes fresh fruits and vegetables, whole grains, lean  protein, and low-fat dairy products. Limit your intake of foods with high amounts of sugar, saturated fats, and salt.  Take vitamin and mineral supplements as recommended by your health care provider.  Do not drink alcohol if your health care provider tells you not to drink.  If you drink alcohol: ? Limit how much you have to 0-2 drinks a day. ? Be aware of how much alcohol is in your drink. In the [place] one drink equals one 12 oz bottle of beer (355 mL), one 5 oz glass of wine (148 mL), or one 1 oz glass of hard liquor (44 mL). Lifestyle  Take daily care of your teeth and gums.  Stay active. Exercise for at least 30 minutes on 5 or more days each week.  Do not use any products that contain nicotine or tobacco, such as cigarettes, e-cigarettes, and chewing tobacco. If you need help quitting, ask your health care provider.  If you are sexually active, practice safe sex. Use a condom or other form of protection to prevent STIs (sexually transmitted infections).  Talk with your health care provider about taking a low-dose aspirin or statin. What's next?  Visit your health care provider once a year for a well check visit.  Ask your health care provider how often you should have your eyes and teeth checked.  Stay up to date on all vaccines. This information is not intended to replace advice given to you by your health care provider. Make sure you discuss any questions you have with your health care provider. Document Revised: 01/26/2018 Document Reviewed: 01/26/2018 Elsevier Patient Education  2020 [employer]  

## 2019-06-01 DIAGNOSIS — H40023 Open angle with borderline findings, high risk, bilateral: Secondary | ICD-10-CM | POA: Diagnosis not present

## 2019-06-01 DIAGNOSIS — H1045 Other chronic allergic conjunctivitis: Secondary | ICD-10-CM | POA: Diagnosis not present

## 2019-06-01 DIAGNOSIS — H04123 Dry eye syndrome of bilateral lacrimal glands: Secondary | ICD-10-CM | POA: Diagnosis not present

## 2019-09-18 DIAGNOSIS — Z85828 Personal history of other malignant neoplasm of skin: Secondary | ICD-10-CM | POA: Diagnosis not present

## 2019-09-24 ENCOUNTER — Ambulatory Visit (INDEPENDENT_AMBULATORY_CARE_PROVIDER_SITE_OTHER): Payer: Medicare PPO | Admitting: Emergency Medicine

## 2019-09-24 ENCOUNTER — Other Ambulatory Visit: Payer: Self-pay

## 2019-09-24 DIAGNOSIS — Z13228 Encounter for screening for other metabolic disorders: Secondary | ICD-10-CM

## 2019-09-24 DIAGNOSIS — Z0000 Encounter for general adult medical examination without abnormal findings: Secondary | ICD-10-CM

## 2019-09-24 DIAGNOSIS — Z13220 Encounter for screening for lipoid disorders: Secondary | ICD-10-CM

## 2019-09-24 DIAGNOSIS — Z130 Encounter for screening for diseases of the blood and blood-forming organs and certain disorders involving the immune mechanism: Secondary | ICD-10-CM

## 2019-09-25 LAB — CBC WITH DIFFERENTIAL/PLATELET
Basophils Absolute: 0 10*3/uL (ref 0.0–0.2)
Basos: 1 %
EOS (ABSOLUTE): 0.2 10*3/uL (ref 0.0–0.4)
Eos: 3 %
Hematocrit: 46.2 % (ref 37.5–51.0)
Hemoglobin: 15.9 g/dL (ref 13.0–17.7)
Immature Grans (Abs): 0.1 10*3/uL (ref 0.0–0.1)
Immature Granulocytes: 1 %
Lymphocytes Absolute: 1.7 10*3/uL (ref 0.7–3.1)
Lymphs: 28 %
MCH: 31.4 pg (ref 26.6–33.0)
MCHC: 34.4 g/dL (ref 31.5–35.7)
MCV: 91 fL (ref 79–97)
Monocytes Absolute: 0.7 10*3/uL (ref 0.1–0.9)
Monocytes: 12 %
Neutrophils Absolute: 3.3 10*3/uL (ref 1.4–7.0)
Neutrophils: 55 %
Platelets: 227 10*3/uL (ref 150–450)
RBC: 5.06 x10E6/uL (ref 4.14–5.80)
RDW: 12.2 % (ref 11.6–15.4)
WBC: 6 10*3/uL (ref 3.4–10.8)

## 2019-09-25 LAB — CMP14+EGFR
ALT: 16 IU/L (ref 0–44)
AST: 16 IU/L (ref 0–40)
Albumin/Globulin Ratio: 1.6 (ref 1.2–2.2)
Albumin: 4.1 g/dL (ref 3.7–4.7)
Alkaline Phosphatase: 84 IU/L (ref 48–121)
BUN/Creatinine Ratio: 18 (ref 10–24)
BUN: 17 mg/dL (ref 8–27)
Bilirubin Total: 0.5 mg/dL (ref 0.0–1.2)
CO2: 24 mmol/L (ref 20–29)
Calcium: 8.5 mg/dL — ABNORMAL LOW (ref 8.6–10.2)
Chloride: 104 mmol/L (ref 96–106)
Creatinine, Ser: 0.92 mg/dL (ref 0.76–1.27)
GFR calc Af Amer: 92 mL/min/{1.73_m2} (ref >59)
GFR calc non Af Amer: 79 mL/min/{1.73_m2} (ref >59)
Globulin, Total: 2.6 g/dL (ref 1.5–4.5)
Glucose: 117 mg/dL — ABNORMAL HIGH (ref 65–99)
Potassium: 4.2 mmol/L (ref 3.5–5.2)
Sodium: 141 mmol/L (ref 134–144)
Total Protein: 6.7 g/dL (ref 6.0–8.5)

## 2019-09-25 LAB — LIPID PANEL
Chol/HDL Ratio: 4.6 ratio (ref 0.0–5.0)
Cholesterol, Total: 184 mg/dL (ref 100–199)
HDL: 40 mg/dL (ref >39)
LDL Chol Calc (NIH): 127 mg/dL — ABNORMAL HIGH (ref 0–99)
Triglycerides: 94 mg/dL (ref 0–149)
VLDL Cholesterol Cal: 17 mg/dL (ref 5–40)

## 2019-09-27 ENCOUNTER — Other Ambulatory Visit: Payer: Self-pay

## 2019-09-27 ENCOUNTER — Ambulatory Visit (INDEPENDENT_AMBULATORY_CARE_PROVIDER_SITE_OTHER): Payer: Medicare PPO | Admitting: Emergency Medicine

## 2019-09-27 ENCOUNTER — Encounter: Payer: Self-pay | Admitting: Emergency Medicine

## 2019-09-27 VITALS — BP 153/79 | HR 84 | Temp 97.5°F | Resp 16 | Ht 67.0 in | Wt 193.0 lb

## 2019-09-27 DIAGNOSIS — Z8546 Personal history of malignant neoplasm of prostate: Secondary | ICD-10-CM

## 2019-09-27 DIAGNOSIS — R739 Hyperglycemia, unspecified: Secondary | ICD-10-CM | POA: Diagnosis not present

## 2019-09-27 DIAGNOSIS — Z0000 Encounter for general adult medical examination without abnormal findings: Secondary | ICD-10-CM

## 2019-09-27 DIAGNOSIS — Z0001 Encounter for general adult medical examination with abnormal findings: Secondary | ICD-10-CM

## 2019-09-27 NOTE — Patient Instructions (Addendum)
   If you have lab work done today you will be contacted with your lab results within the next 2 weeks.  If you have not heard from us then please contact us. The fastest way to get your results is to register for My Chart.   IF you received an x-ray today, you will receive an invoice from [facility]. Please contact [facility] at [phone] with questions or concerns regarding your invoice.   IF you received labwork today, you will receive an invoice from [facility]. Please contact [facility] at [phone] with questions or concerns regarding your invoice.   Our billing staff will not be able to assist you with questions regarding bills from these companies.  You will be contacted with the lab results as soon as they are available. The fastest way to get your results is to activate your My Chart account. Instructions are located on the last page of this paperwork. If you have not heard from us regarding the results in 2 weeks, please contact this office.      Health Maintenance, Male Adopting a healthy lifestyle and getting preventive care are important in promoting health and wellness. Ask your health care provider about:  The right schedule for you to have regular tests and exams.  Things you can do on your own to prevent diseases and keep yourself healthy. What should I know about diet, weight, and exercise? Eat a healthy diet   Eat a diet that includes plenty of vegetables, fruits, low-fat dairy products, and lean protein.  Do not eat a lot of foods that are high in solid fats, added sugars, or sodium. Maintain a healthy weight Body mass index (BMI) is a measurement that can be used to identify possible weight problems. It estimates body fat based on height and weight. Your health care provider can help determine your BMI and help you achieve or maintain a healthy weight. Get regular exercise Get regular exercise. This is one of the most important things you  can do for your health. Most adults should:  Exercise for at least 150 minutes each week. The exercise should increase your heart rate and make you sweat (moderate-intensity exercise).  Do strengthening exercises at least twice a week. This is in addition to the moderate-intensity exercise.  Spend less time sitting. Even light physical activity can be beneficial. Watch cholesterol and blood lipids Have your blood tested for lipids and cholesterol at 79 years of age, then have this test every 5 years. You may need to have your cholesterol levels checked more often if:  Your lipid or cholesterol levels are high.  You are older than 79 years of age.  You are at high risk for heart disease. What should I know about cancer screening? Many types of cancers can be detected early and may often be prevented. Depending on your health history and family history, you may need to have cancer screening at various ages. This may include screening for:  Colorectal cancer.  Prostate cancer.  Skin cancer.  Lung cancer. What should I know about heart disease, diabetes, and high blood pressure? Blood pressure and heart disease  High blood pressure causes heart disease and increases the risk of stroke. This is more likely to develop in people who have high blood pressure readings, are of African descent, or are overweight.  Talk with your health care provider about your target blood pressure readings.  Have your blood pressure checked: ? Every 3-5 years if you are 18-39   years of age. ? Every year if you are 40 years old or older.  If you are between the ages of 65 and 75 and are a current or former smoker, ask your health care provider if you should have a one-time screening for abdominal aortic aneurysm (AAA). Diabetes Have regular diabetes screenings. This checks your fasting blood sugar level. Have the screening done:  Once every three years after age 45 if you are at a normal weight and have  a low risk for diabetes.  More often and at a younger age if you are overweight or have a high risk for diabetes. What should I know about preventing infection? Hepatitis B If you have a higher risk for hepatitis B, you should be screened for this virus. Talk with your health care provider to find out if you are at risk for hepatitis B infection. Hepatitis C Blood testing is recommended for:  Everyone born from 1945 through 1965.  Anyone with known risk factors for hepatitis C. Sexually transmitted infections (STIs)  You should be screened each year for STIs, including gonorrhea and chlamydia, if: ? You are sexually active and are younger than 79 years of age. ? You are older than 79 years of age and your health care provider tells you that you are at risk for this type of infection. ? Your sexual activity has changed since you were last screened, and you are at increased risk for chlamydia or gonorrhea. Ask your health care provider if you are at risk.  Ask your health care provider about whether you are at high risk for HIV. Your health care provider may recommend a prescription medicine to help prevent HIV infection. If you choose to take medicine to prevent HIV, you should first get tested for HIV. You should then be tested every 3 months for as long as you are taking the medicine. Follow these instructions at home: Lifestyle  Do not use any products that contain nicotine or tobacco, such as cigarettes, e-cigarettes, and chewing tobacco. If you need help quitting, ask your health care provider.  Do not use street drugs.  Do not share needles.  Ask your health care provider for help if you need support or information about quitting drugs. Alcohol use  Do not drink alcohol if your health care provider tells you not to drink.  If you drink alcohol: ? Limit how much you have to 0-2 drinks a day. ? Be aware of how much alcohol is in your drink. In the [place] one drink equals one 12  oz bottle of beer (355 mL), one 5 oz glass of wine (148 mL), or one 1 oz glass of hard liquor (44 mL). General instructions  Schedule regular health, dental, and eye exams.  Stay current with your vaccines.  Tell your health care provider if: ? You often feel depressed. ? You have ever been abused or do not feel safe at home. Summary  Adopting a healthy lifestyle and getting preventive care are important in promoting health and wellness.  Follow your health care provider's instructions about healthy diet, exercising, and getting tested or screened for diseases.  Follow your health care provider's instructions on monitoring your cholesterol and blood pressure. This information is not intended to replace advice given to you by your health care provider. Make sure you discuss any questions you have with your health care provider. Document Revised: 01/25/2018 Document Reviewed: 01/25/2018 Elsevier Patient Education  2020 Elsevier Inc.  

## 2019-09-27 NOTE — Progress Notes (Signed)
Jack Haynes 79 y.o.   Chief Complaint  Patient presents with  . Annual Exam    HISTORY OF PRESENT ILLNESS: This is a 79 y.o. male here for his annual exam. Has history of prostate cancer.  Doing well.  No complaints. Recent blood work showed normal CBC, hyperglycemia, and elevated LDL.  Otherwise normal labs. Has no complaints or medical concerns today. Fully vaccinated against Covid.  HPI   Prior to Admission medications   Medication Sig Start Date End Date Taking? Authorizing Provider  acetic acid-hydrocortisone (VOSOL-HC) OTIC solution Place 3 drops into the left ear 3 (three) times daily. Patient not taking: Reported on 04/04/2019 04/01/17   [name], MD  carboxymethylcellulose (REFRESH PLUS) 0.5 % SOLN Place 1 drop into the left eye daily.    [provider]  dicyclomine (BENTYL) 10 MG capsule Take 1 capsule (10 mg total) by mouth 2 (two) times daily before a meal for 7 days. 09/19/18 09/26/18  [name], MD  meclizine (ANTIVERT) 25 MG tablet Take 1 tablet (25 mg total) by mouth 3 (three) times daily as needed for dizziness. Patient not taking: Reported on 09/19/2018 08/14/18   Fransico Meadow, PA-C  Multiple Vitamin (MULTIVITAMIN) tablet Take 1 tablet by mouth daily.    [provider]  naproxen sodium (ANAPROX) 220 MG tablet Take 220 mg by mouth as needed.     [provider]    No Known Allergies  Patient Active Problem List   Diagnosis Date Noted  . Abnormal EKG 01/02/2015  . Prostate cancer ([place]) 11/01/2011    Past Medical History:  Diagnosis Date  . Arthritis   . Cancer ([place])    hx of prostatae cancer    Past Surgical History:  Procedure Laterality Date  . APPENDECTOMY    . BACK SURGERY  1998  . EYE SURGERY Left 1965  . JOINT REPLACEMENT Left 2010   left knee replacement  . KNEE SURGERY    . PROSTATE SURGERY    . SPINE SURGERY    . TOTAL KNEE ARTHROPLASTY  02/01/2012   Procedure: TOTAL KNEE  ARTHROPLASTY;  Surgeon: Sydnee Cabal, MD;  Location: [facility];  Service: Orthopedics;  Laterality: Left;    Social History   Socioeconomic History  . Marital status: Divorced    Spouse name: Not on file  . Number of children: 2  . Years of education: 81  . Highest education level: Not on file  Occupational History  . 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   Tobacco Use  . Smoking status: Former Smoker    Quit date: 02/15/1981    Years since quitting: 38.6  . Smokeless tobacco: Current User    Types: Chew  Substance and Sexual Activity  . Alcohol use: Yes    Alcohol/week: 5.0 standard drinks    Types: 5 Shots of liquor per week    Comment:  beer or mixed drink  . Drug use: No  . Sexual activity: Not on file  Other Topics Concern  . Not on file  Social History Narrative  . Not on file   Social Determinants of Health   Financial Resource Strain:   . Difficulty of Paying Living Expenses:   Food Insecurity:   . Worried About Charity fundraiser in the Last Year:   . Arboriculturist in the Last Year:   Transportation Needs:   . Film/video editor (Medical):   Marland Kitchen Lack of  Transportation (Non-Medical):   Physical Activity:   . Days of Exercise per Week:   . Minutes of Exercise per Session:   Stress:   . Feeling of Stress :   Social Connections:   . Frequency of Communication with Friends and Family:   . Frequency of Social Gatherings with Friends and Family:   . Attends Religious Services:   . Active Member of Clubs or Organizations:   . Attends Archivist Meetings:   Marland Kitchen Marital Status:   Intimate Partner Violence:   . Fear of Current or Ex-Partner:   . Emotionally Abused:   Marland Kitchen Physically Abused:   . Sexually Abused:     Family History  Problem Relation Age of Onset  . Cancer Sister        brain tumor  . Heart disease Brother   . Alzheimer's disease Brother   . Heart disease Sister   . Alzheimer's disease Father   . Hypertension  Sister   . Colon cancer Neg Hx   . Colon polyps Neg Hx   . Esophageal cancer Neg Hx   . Rectal cancer Neg Hx   . Stomach cancer Neg Hx      Review of Systems  Constitutional: Negative.  Negative for chills, fever and weight loss.  HENT: Negative.  Negative for congestion and sore throat.   Respiratory: Negative.  Negative for cough and shortness of breath.   Cardiovascular: Negative.  Negative for chest pain and palpitations.  Gastrointestinal: Negative.  Negative for abdominal pain, blood in stool, diarrhea, melena, nausea and vomiting.  Genitourinary: Negative.  Negative for dysuria and hematuria.  Musculoskeletal: Negative.   Skin: Negative.  Negative for rash.  Neurological: Negative.  Negative for dizziness and headaches.  All other systems reviewed and are negative.    Today's Vitals   09/27/19 1329  BP: (!) 153/79  Pulse: 84  Resp: 16  Temp: (!) 97.5 F (36.4 C)  TempSrc: Temporal  SpO2: 95%  Weight: 193 lb (87.5 kg)  Height: 5\' 7"  (1.702 m)   Body mass index is 30.23 kg/m.   Physical Exam Vitals reviewed.  Constitutional:      Appearance: Normal appearance.  HENT:     Head: Normocephalic.  Eyes:     Extraocular Movements: Extraocular movements intact.     Pupils: Pupils are equal, round, and reactive to light.  Cardiovascular:     Rate and Rhythm: Normal rate and regular rhythm.     Pulses: Normal pulses.     Heart sounds: Normal heart sounds.  Pulmonary:     Effort: Pulmonary effort is normal.     Breath sounds: Normal breath sounds.  Abdominal:     General: There is no distension.     Palpations: Abdomen is soft. There is no mass.     Tenderness: There is no abdominal tenderness.  Musculoskeletal:     Cervical back: Normal range of motion and neck supple.  Skin:    General: Skin is warm and dry.     Capillary Refill: Capillary refill takes less than 2 seconds.  Neurological:     General: No focal deficit present.     Mental Status: He is  alert and oriented to person, place, and time.  Psychiatric:        Mood and Affect: Mood normal.        Behavior: Behavior normal.      ASSESSMENT & PLAN: Jack Haynes was seen today for annual exam.  Diagnoses and all  orders for this visit:  Routine general medical examination at a health care facility  History of prostate cancer -     PSA  Hyperglycemia -     Hemoglobin A1c    Patient Instructions       If you have lab work done today you will be contacted with your lab results within the next 2 weeks.  If you have not heard from [place] then please contact us. The fastest way to get your results is to register for My Chart.   IF you received an x-ray today, you will receive an invoice from [facility]. Please contact [facility] at [phone] with questions or concerns regarding your invoice.   IF you received labwork today, you will receive an invoice from [facility]. Please contact [facility] at [phone] with questions or concerns regarding your invoice.   Our billing staff will not be able to assist you with questions regarding bills from these companies.  You will be contacted with the lab results as soon as they are available. The fastest way to get your results is to activate your My Chart account. Instructions are located on the last page of this paperwork. If you have not heard from [place] regarding the results in 2 weeks, please contact this office.      Health Maintenance, Male Adopting a healthy lifestyle and getting preventive care are important in promoting health and wellness. Ask your health care provider about:  The right schedule for you to have regular tests and exams.  Things you can do on your own to prevent diseases and keep yourself healthy. What should I know about diet, weight, and exercise? Eat a healthy diet   Eat a diet that includes plenty of vegetables, fruits, low-fat dairy products, and lean protein.  Do not eat a lot of  foods that are high in solid fats, added sugars, or sodium. Maintain a healthy weight Body mass index (BMI) is a measurement that can be used to identify possible weight problems. It estimates body fat based on height and weight. Your health care provider can help determine your BMI and help you achieve or maintain a healthy weight. Get regular exercise Get regular exercise. This is one of the most important things you can do for your health. Most adults should:  Exercise for at least 150 minutes each week. The exercise should increase your heart rate and make you sweat (moderate-intensity exercise).  Do strengthening exercises at least twice a week. This is in addition to the moderate-intensity exercise.  Spend less time sitting. Even light physical activity can be beneficial. Watch cholesterol and blood lipids Have your blood tested for lipids and cholesterol at 79 years of age, then have this test every 5 years. You may need to have your cholesterol levels checked more often if:  Your lipid or cholesterol levels are high.  You are older than 79 years of age.  You are at high risk for heart disease. What should I know about cancer screening? Many types of cancers can be detected early and may often be prevented. Depending on your health history and family history, you may need to have cancer screening at various ages. This may include screening for:  Colorectal cancer.  Prostate cancer.  Skin cancer.  Lung cancer. What should I know about heart disease, diabetes, and high blood pressure? Blood pressure and heart disease  High blood pressure causes heart disease and increases the risk of stroke. This is more likely to develop  in people who have high blood pressure readings, are of African descent, or are overweight.  Talk with your health care provider about your target blood pressure readings.  Have your blood pressure checked: ? Every 3-5 years if you are 18-39 years of  age. ? Every year if you are 32 years old or older.  If you are between the ages of 27 and 4 and are a current or former smoker, ask your health care provider if you should have a one-time screening for abdominal aortic aneurysm (AAA). Diabetes Have regular diabetes screenings. This checks your fasting blood sugar level. Have the screening done:  Once every three years after age 67 if you are at a normal weight and have a low risk for diabetes.  More often and at a younger age if you are overweight or have a high risk for diabetes. What should I know about preventing infection? Hepatitis B If you have a higher risk for hepatitis B, you should be screened for this virus. Talk with your health care provider to find out if you are at risk for hepatitis B infection. Hepatitis C Blood testing is recommended for:  Everyone born from [birth year] through 1965.  Anyone with known risk factors for hepatitis C. Sexually transmitted infections (STIs)  You should be screened each year for STIs, including gonorrhea and chlamydia, if: ? You are sexually active and are younger than 79 years of age. ? You are older than 79 years of age and your health care provider tells you that you are at risk for this type of infection. ? Your sexual activity has changed since you were last screened, and you are at increased risk for chlamydia or gonorrhea. Ask your health care provider if you are at risk.  Ask your health care provider about whether you are at high risk for HIV. Your health care provider may recommend a prescription medicine to help prevent HIV infection. If you choose to take medicine to prevent HIV, you should first get tested for HIV. You should then be tested every 3 months for as long as you are taking the medicine. Follow these instructions at home: Lifestyle  Do not use any products that contain nicotine or tobacco, such as cigarettes, e-cigarettes, and chewing tobacco. If you need help quitting,  ask your health care provider.  Do not use street drugs.  Do not share needles.  Ask your health care provider for help if you need support or information about quitting drugs. Alcohol use  Do not drink alcohol if your health care provider tells you not to drink.  If you drink alcohol: ? Limit how much you have to 0-2 drinks a day. ? Be aware of how much alcohol is in your drink. In the [place] one drink equals one 12 oz bottle of beer (355 mL), one 5 oz glass of wine (148 mL), or one 1 oz glass of hard liquor (44 mL). General instructions  Schedule regular health, dental, and eye exams.  Stay current with your vaccines.  Tell your health care provider if: ? You often feel depressed. ? You have ever been abused or do not feel safe at home. Summary  Adopting a healthy lifestyle and getting preventive care are important in promoting health and wellness.  Follow your health care provider's instructions about healthy diet, exercising, and getting tested or screened for diseases.  Follow your health care provider's instructions on monitoring your cholesterol and blood pressure. This information is not intended to  replace advice given to you by your health care provider. Make sure you discuss any questions you have with your health care provider. Document Revised: 01/25/2018 Document Reviewed: 01/25/2018 Elsevier Patient Education  2020 Elsevier Inc.      Agustina Caroli, MD [facility]

## 2019-09-28 LAB — HEMOGLOBIN A1C
Est. average glucose Bld gHb Est-mCnc: 143 mg/dL
Hgb A1c MFr Bld: 6.6 % — ABNORMAL HIGH (ref 4.8–5.6)

## 2019-09-28 LAB — PSA: Prostate Specific Ag, Serum: <0.1 ng/mL (ref 0.0–4.0)

## 2019-10-09 ENCOUNTER — Other Ambulatory Visit: Payer: Self-pay

## 2019-10-09 ENCOUNTER — Encounter: Payer: Self-pay | Admitting: Emergency Medicine

## 2019-10-09 ENCOUNTER — Ambulatory Visit: Payer: Medicare PPO | Admitting: Emergency Medicine

## 2019-10-09 VITALS — BP 134/90 | HR 76 | Temp 97.8°F | Resp 16 | Ht 67.0 in | Wt 194.0 lb

## 2019-10-09 DIAGNOSIS — I10 Essential (primary) hypertension: Secondary | ICD-10-CM | POA: Diagnosis not present

## 2019-10-09 MED ORDER — AMLODIPINE BESYLATE 5 MG PO TABS: 5.0000 mg | ORAL_TABLET | Freq: Every day | ORAL | 3 refills | Status: DC

## 2019-10-09 NOTE — Patient Instructions (Addendum)
Continue measuring blood pressure readings at home. Start amlodipine 5 mg daily. Follow-up in 3 months.    If you have lab work done today you will be contacted with your lab results within the next 2 weeks.  If you have not heard from [place] then please contact us. The fastest way to get your results is to register for My Chart.   IF you received an x-ray today, you will receive an invoice from [facility]. Please contact [facility] at [phone] with questions or concerns regarding your invoice.   IF you received labwork today, you will receive an invoice from [facility]. Please contact [facility] at [phone] with questions or concerns regarding your invoice.   Our billing staff will not be able to assist you with questions regarding bills from these companies.  You will be contacted with the lab results as soon as they are available. The fastest way to get your results is to activate your My Chart account. Instructions are located on the last page of this paperwork. If you have not heard from [place] regarding the results in 2 weeks, please contact this office.     Hypertension, Adult High blood pressure (hypertension) is when the force of blood pumping through the arteries is too strong. The arteries are the blood vessels that carry blood from the heart throughout the body. Hypertension forces the heart to work harder to pump blood and may cause arteries to become narrow or stiff. Untreated or uncontrolled hypertension can cause a heart attack, heart failure, a stroke, kidney disease, and other problems. A blood pressure reading consists of a higher number over a lower number. Ideally, your blood pressure should be below 120/80. The first ("top") number is called the systolic pressure. It is a measure of the pressure in your arteries as your heart beats. The second ("bottom") number is called the diastolic pressure. It is a measure of the pressure in your arteries as the heart  relaxes. What are the causes? The exact cause of this condition is not known. There are some conditions that result in or are related to high blood pressure. What increases the risk? Some risk factors for high blood pressure are under your control. The following factors may make you more likely to develop this condition:  Smoking.  Having type 2 diabetes mellitus, high cholesterol, or both.  Not getting enough exercise or physical activity.  Being overweight.  Having too much fat, sugar, calories, or salt (sodium) in your diet.  Drinking too much alcohol. Some risk factors for high blood pressure may be difficult or impossible to change. Some of these factors include:  Having chronic kidney disease.  Having a family history of high blood pressure.  Age. Risk increases with age.  Race. You may be at higher risk if you are African American.  Gender. Men are at higher risk than women before age 42. After age 61, women are at higher risk than men.  Having obstructive sleep apnea.  Stress. What are the signs or symptoms? High blood pressure may not cause symptoms. Very high blood pressure (hypertensive crisis) may cause:  Headache.  Anxiety.  Shortness of breath.  Nosebleed.  Nausea and vomiting.  Vision changes.  Severe chest pain.  Seizures. How is this diagnosed? This condition is diagnosed by measuring your blood pressure while you are seated, with your arm resting on a flat surface, your legs uncrossed, and your feet flat on the floor. The cuff of the blood pressure monitor will  be placed directly against the skin of your upper arm at the level of your heart. It should be measured at least twice using the same arm. Certain conditions can cause a difference in blood pressure between your right and left arms. Certain factors can cause blood pressure readings to be lower or higher than normal for a short period of time:  When your blood pressure is higher when you  are in a health care provider's office than when you are at home, this is called white coat hypertension. Most people with this condition do not need medicines.  When your blood pressure is higher at home than when you are in a health care provider's office, this is called masked hypertension. Most people with this condition may need medicines to control blood pressure. If you have a high blood pressure reading during one visit or you have normal blood pressure with other risk factors, you may be asked to:  Return on a different day to have your blood pressure checked again.  Monitor your blood pressure at home for 1 week or longer. If you are diagnosed with hypertension, you may have other blood or imaging tests to help your health care provider understand your overall risk for other conditions. How is this treated? This condition is treated by making healthy lifestyle changes, such as eating healthy foods, exercising more, and reducing your alcohol intake. Your health care provider may prescribe medicine if lifestyle changes are not enough to get your blood pressure under control, and if:  Your systolic blood pressure is above 130.  Your diastolic blood pressure is above 80. Your personal target blood pressure may vary depending on your medical conditions, your age, and other factors. Follow these instructions at home: Eating and drinking   Eat a diet that is high in fiber and potassium, and low in sodium, added sugar, and fat. An example eating plan is called the DASH (Dietary Approaches to Stop Hypertension) diet. To eat this way: ? Eat plenty of fresh fruits and vegetables. Try to fill one half of your plate at each meal with fruits and vegetables. ? Eat whole grains, such as whole-wheat pasta, brown rice, or whole-grain bread. Fill about one fourth of your plate with whole grains. ? Eat or drink low-fat dairy products, such as skim milk or low-fat yogurt. ? Avoid fatty cuts of meat,  processed or cured meats, and poultry with skin. Fill about one fourth of your plate with lean proteins, such as fish, chicken without skin, beans, eggs, or tofu. ? Avoid pre-made and processed foods. These tend to be higher in sodium, added sugar, and fat.  Reduce your daily sodium intake. Most people with hypertension should eat less than 1,500 mg of sodium a day.  Do not drink alcohol if: ? Your health care provider tells you not to drink. ? You are pregnant, may be pregnant, or are planning to become pregnant.  If you drink alcohol: ? Limit how much you use to:  0-1 drink a day for women.  0-2 drinks a day for men. ? Be aware of how much alcohol is in your drink. In the [place] one drink equals one 12 oz bottle of beer (355 mL), one 5 oz glass of wine (148 mL), or one 1 oz glass of hard liquor (44 mL). Lifestyle   Work with your health care provider to maintain a healthy body weight or to lose weight. Ask what an ideal weight is for you.  Get at  least 30 minutes of exercise most days of the week. Activities may include walking, swimming, or biking.  Include exercise to strengthen your muscles (resistance exercise), such as Pilates or lifting weights, as part of your weekly exercise routine. Try to do these types of exercises for 30 minutes at least 3 days a week.  Do not use any products that contain nicotine or tobacco, such as cigarettes, e-cigarettes, and chewing tobacco. If you need help quitting, ask your health care provider.  Monitor your blood pressure at home as told by your health care provider.  Keep all follow-up visits as told by your health care provider. This is important. Medicines  Take over-the-counter and prescription medicines only as told by your health care provider. Follow directions carefully. Blood pressure medicines must be taken as prescribed.  Do not skip doses of blood pressure medicine. Doing this puts you at risk for problems and can make the  medicine less effective.  Ask your health care provider about side effects or reactions to medicines that you should watch for. Contact a health care provider if you:  Think you are having a reaction to a medicine you are taking.  Have headaches that keep coming back (recurring).  Feel dizzy.  Have swelling in your ankles.  Have trouble with your vision. Get help right away if you:  Develop a severe headache or confusion.  Have unusual weakness or numbness.  Feel faint.  Have severe pain in your chest or abdomen.  Vomit repeatedly.  Have trouble breathing. Summary  Hypertension is when the force of blood pumping through your arteries is too strong. If this condition is not controlled, it may put you at risk for serious complications.  Your personal target blood pressure may vary depending on your medical conditions, your age, and other factors. For most people, a normal blood pressure is less than 120/80.  Hypertension is treated with lifestyle changes, medicines, or a combination of both. Lifestyle changes include losing weight, eating a healthy, low-sodium diet, exercising more, and limiting alcohol. This information is not intended to replace advice given to you by your health care provider. Make sure you discuss any questions you have with your health care provider. Document Revised: 10/12/2017 Document Reviewed: 10/12/2017 [employer]vier Patient Education  2020 [employer].

## 2019-10-09 NOTE — Progress Notes (Signed)
Jack Haynes 79 y.o.   Chief Complaint  Patient presents with  . Hypertension    per patient here to have blood pressure checked    HISTORY OF PRESENT ILLNESS: This is a 79 y.o. male complaining of elevated blood pressure readings at home for the past week. States it has been 180s over 90-100 range.  Asymptomatic.  Neighbor nurse gave him amlodipine 5 mg tablets that he took half of a tablet for several days. No other complaints or medical concerns today. BP Readings from Last 3 Encounters:  10/09/19 (!) 155/90  09/27/19 (!) 153/79  04/04/19 136/75    HPI   Prior to Admission medications   Medication Sig Start Date End Date Taking? Authorizing Provider  aspirin EC 81 MG tablet Take 81 mg by mouth daily. Swallow whole.   Yes [provider]  carboxymethylcellulose (REFRESH PLUS) 0.5 % SOLN Place 1 drop into the left eye daily.   Yes [provider]  Misc Natural Products (NEURIVA PO) Take by mouth daily.   Yes [provider]  Multiple Vitamin (MULTIVITAMIN) tablet Take 1 tablet by mouth daily.   Yes [provider]  naproxen sodium (ANAPROX) 220 MG tablet Take 220 mg by mouth as needed.    Yes [provider]  acetic acid-hydrocortisone (VOSOL-HC) OTIC solution Place 3 drops into the left ear 3 (three) times daily. Patient not taking: Reported on 10/09/2019 04/01/17   [name], MD  dicyclomine (BENTYL) 10 MG capsule Take 1 capsule (10 mg total) by mouth 2 (two) times daily before a meal for 7 days. 09/19/18 09/26/18  [name], MD  meclizine (ANTIVERT) 25 MG tablet Take 1 tablet (25 mg total) by mouth 3 (three) times daily as needed for dizziness. Patient not taking: Reported on 10/09/2019 08/14/18   Fransico Meadow, PA-C    No Known Allergies  Patient Active Problem List   Diagnosis Date Noted  . Abnormal EKG 01/02/2015  . Prostate cancer ([place]) 11/01/2011    Past Medical History:  Diagnosis Date  .  Arthritis   . Cancer ([place])    hx of prostatae cancer    Past Surgical History:  Procedure Laterality Date  . APPENDECTOMY    . BACK SURGERY  1998  . EYE SURGERY Left 1965  . JOINT REPLACEMENT Left 2010   left knee replacement  . KNEE SURGERY    . PROSTATE SURGERY    . SPINE SURGERY    . TOTAL KNEE ARTHROPLASTY  02/01/2012   Procedure: TOTAL KNEE ARTHROPLASTY;  Surgeon: Sydnee Cabal, MD;  Location: [facility];  Service: Orthopedics;  Laterality: Left;    Social History   Socioeconomic History  . Marital status: Divorced    Spouse name: Not on file  . Number of children: 2  . Years of education: 33  . Highest education level: Not on file  Occupational History  . 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   Tobacco Use  . Smoking status: Former Smoker    Quit date: 02/15/1981    Years since quitting: 38.6  . Smokeless tobacco: Current User    Types: Chew  Substance and Sexual Activity  . Alcohol use: Yes    Alcohol/week: 5.0 standard drinks    Types: 5 Shots of liquor per week    Comment:  beer or mixed drink  . Drug use: No  . Sexual activity: Not on file  Other Topics Concern  . Not on file  Social History Narrative  . Not on file   Social Determinants of Health   Financial Resource Strain:   . Difficulty of Paying Living Expenses: Not on file  Food Insecurity:   . Worried About Charity fundraiser in the Last Year: Not on file  . Ran Out of Food in the Last Year: Not on file  Transportation Needs:   . Lack of Transportation (Medical): Not on file  . Lack of Transportation (Non-Medical): Not on file  Physical Activity:   . Days of Exercise per Week: Not on file  . Minutes of Exercise per Session: Not on file  Stress:   . Feeling of Stress : Not on file  Social Connections:   . Frequency of Communication with Friends and Family: Not on file  . Frequency of Social Gatherings with Friends and Family: Not on file  . Attends Religious Services: Not  on file  . Active Member of Clubs or Organizations: Not on file  . Attends Archivist Meetings: Not on file  . Marital Status: Not on file  Intimate Partner Violence:   . Fear of Current or Ex-Partner: Not on file  . Emotionally Abused: Not on file  . Physically Abused: Not on file  . Sexually Abused: Not on file    Family History  Problem Relation Age of Onset  . Cancer Sister        brain tumor  . Heart disease Brother   . Alzheimer's disease Brother   . Heart disease Sister   . Alzheimer's disease Father   . Hypertension Sister   . Colon cancer Neg Hx   . Colon polyps Neg Hx   . Esophageal cancer Neg Hx   . Rectal cancer Neg Hx   . Stomach cancer Neg Hx      Review of Systems  Constitutional: Negative.  Negative for chills and fever.  HENT: Negative for congestion and sore throat.   Eyes: Negative.  Negative for blurred vision and double vision.  Respiratory: Negative.  Negative for cough and shortness of breath.   Cardiovascular: Negative.  Negative for chest pain and palpitations.  Gastrointestinal: Negative.  Negative for abdominal pain, blood in stool, diarrhea, nausea and vomiting.  Genitourinary: Negative.  Negative for dysuria and hematuria.  Musculoskeletal: Negative.  Negative for myalgias and neck pain.  Skin: Negative.  Negative for rash.  Neurological: Negative.  Negative for dizziness and headaches.  All other systems reviewed and are negative.   Today's Vitals   10/09/19 1132  BP: (!) 155/90  Pulse: 76  Resp: 16  Temp: 97.8 F (36.6 C)  TempSrc: Temporal  SpO2: 94%  Weight: 194 lb (88 kg)  Height: 5\' 7"  (1.702 m)   Body mass index is 30.38 kg/m.  Physical Exam Vitals reviewed.  Constitutional:      Appearance: Normal appearance.  HENT:     Head: Normocephalic.  Eyes:     Extraocular Movements: Extraocular movements intact.     Conjunctiva/sclera: Conjunctivae normal.     Pupils: Pupils are equal, round, and reactive to  light.  Cardiovascular:     Rate and Rhythm: Normal rate and regular rhythm.     Heart sounds: Normal heart sounds.  Pulmonary:     Effort: Pulmonary effort is normal.     Breath sounds: Normal breath sounds.  Musculoskeletal:        General: Normal range of motion.     Cervical back: Normal range of motion and neck  supple.  Skin:    General: Skin is warm and dry.  Neurological:     General: No focal deficit present.     Mental Status: He is alert and oriented to person, place, and time.  Psychiatric:        Mood and Affect: Mood normal.        Behavior: Behavior normal.      ASSESSMENT & PLAN: Essential hypertension New onset hypertension.  Cardiovascular risks associated with this condition discussed.  We will start amlodipine 5 mg daily.  Advised to continue monitoring blood pressure readings at home.  Diet and nutrition discussed. Follow-up in 3 months.   Fl[place] was seen today for hypertension.  Diagnoses and all orders for this visit:  Essential hypertension -     amLODipine (NORVASC) 5 MG tablet; Take 1 tablet (5 mg total) by mouth daily.    Patient Instructions   Continue measuring blood pressure readings at home. Start amlodipine 5 mg daily. Follow-up in 3 months.    If you have lab work done today you will be contacted with your lab results within the next 2 weeks.  If you have not heard from [place] then please contact us. The fastest way to get your results is to register for My Chart.   IF you received an x-ray today, you will receive an invoice from [facility]. Please contact [facility] at [phone] with questions or concerns regarding your invoice.   IF you received labwork today, you will receive an invoice from [facility]. Please contact [facility] at [phone] with questions or concerns regarding your invoice.   Our billing staff will not be able to assist you with questions regarding bills from these companies.  You will be  contacted with the lab results as soon as they are available. The fastest way to get your results is to activate your My Chart account. Instructions are located on the last page of this paperwork. If you have not heard from [place] regarding the results in 2 weeks, please contact this office.     Hypertension, Adult High blood pressure (hypertension) is when the force of blood pumping through the arteries is too strong. The arteries are the blood vessels that carry blood from the heart throughout the body. Hypertension forces the heart to work harder to pump blood and may cause arteries to become narrow or stiff. Untreated or uncontrolled hypertension can cause a heart attack, heart failure, a stroke, kidney disease, and other problems. A blood pressure reading consists of a higher number over a lower number. Ideally, your blood pressure should be below 120/80. The first ("top") number is called the systolic pressure. It is a measure of the pressure in your arteries as your heart beats. The second ("bottom") number is called the diastolic pressure. It is a measure of the pressure in your arteries as the heart relaxes. What are the causes? The exact cause of this condition is not known. There are some conditions that result in or are related to high blood pressure. What increases the risk? Some risk factors for high blood pressure are under your control. The following factors may make you more likely to develop this condition:  Smoking.  Having type 2 diabetes mellitus, high cholesterol, or both.  Not getting enough exercise or physical activity.  Being overweight.  Having too much fat, sugar, calories, or salt (sodium) in your diet.  Drinking too much alcohol. Some risk factors for high blood pressure may be difficult or impossible  to change. Some of these factors include:  Having chronic kidney disease.  Having a family history of high blood pressure.  Age. Risk increases with age.  Race.  You may be at higher risk if you are African American.  Gender. Men are at higher risk than women before age 62. After age 58, women are at higher risk than men.  Having obstructive sleep apnea.  Stress. What are the signs or symptoms? High blood pressure may not cause symptoms. Very high blood pressure (hypertensive crisis) may cause:  Headache.  Anxiety.  Shortness of breath.  Nosebleed.  Nausea and vomiting.  Vision changes.  Severe chest pain.  Seizures. How is this diagnosed? This condition is diagnosed by measuring your blood pressure while you are seated, with your arm resting on a flat surface, your legs uncrossed, and your feet flat on the floor. The cuff of the blood pressure monitor will be placed directly against the skin of your upper arm at the level of your heart. It should be measured at least twice using the same arm. Certain conditions can cause a difference in blood pressure between your right and left arms. Certain factors can cause blood pressure readings to be lower or higher than normal for a short period of time:  When your blood pressure is higher when you are in a health care provider's office than when you are at home, this is called white coat hypertension. Most people with this condition do not need medicines.  When your blood pressure is higher at home than when you are in a health care provider's office, this is called masked hypertension. Most people with this condition may need medicines to control blood pressure. If you have a high blood pressure reading during one visit or you have normal blood pressure with other risk factors, you may be asked to:  Return on a different day to have your blood pressure checked again.  Monitor your blood pressure at home for 1 week or longer. If you are diagnosed with hypertension, you may have other blood or imaging tests to help your health care provider understand your overall risk for other conditions. How  is this treated? This condition is treated by making healthy lifestyle changes, such as eating healthy foods, exercising more, and reducing your alcohol intake. Your health care provider may prescribe medicine if lifestyle changes are not enough to get your blood pressure under control, and if:  Your systolic blood pressure is above 130.  Your diastolic blood pressure is above 80. Your personal target blood pressure may vary depending on your medical conditions, your age, and other factors. Follow these instructions at home: Eating and drinking   Eat a diet that is high in fiber and potassium, and low in sodium, added sugar, and fat. An example eating plan is called the DASH (Dietary Approaches to Stop Hypertension) diet. To eat this way: ? Eat plenty of fresh fruits and vegetables. Try to fill one half of your plate at each meal with fruits and vegetables. ? Eat whole grains, such as whole-wheat pasta, brown rice, or whole-grain bread. Fill about one fourth of your plate with whole grains. ? Eat or drink low-fat dairy products, such as skim milk or low-fat yogurt. ? Avoid fatty cuts of meat, processed or cured meats, and poultry with skin. Fill about one fourth of your plate with lean proteins, such as fish, chicken without skin, beans, eggs, or tofu. ? Avoid pre-made and processed foods. These tend to  be higher in sodium, added sugar, and fat.  Reduce your daily sodium intake. Most people with hypertension should eat less than 1,500 mg of sodium a day.  Do not drink alcohol if: ? Your health care provider tells you not to drink. ? You are pregnant, may be pregnant, or are planning to become pregnant.  If you drink alcohol: ? Limit how much you use to:  0-1 drink a day for women.  0-2 drinks a day for men. ? Be aware of how much alcohol is in your drink. In the [place] one drink equals one 12 oz bottle of beer (355 mL), one 5 oz glass of wine (148 mL), or one 1 oz glass of hard liquor  (44 mL). Lifestyle   Work with your health care provider to maintain a healthy body weight or to lose weight. Ask what an ideal weight is for you.  Get at least 30 minutes of exercise most days of the week. Activities may include walking, swimming, or biking.  Include exercise to strengthen your muscles (resistance exercise), such as Pilates or lifting weights, as part of your weekly exercise routine. Try to do these types of exercises for 30 minutes at least 3 days a week.  Do not use any products that contain nicotine or tobacco, such as cigarettes, e-cigarettes, and chewing tobacco. If you need help quitting, ask your health care provider.  Monitor your blood pressure at home as told by your health care provider.  Keep all follow-up visits as told by your health care provider. This is important. Medicines  Take over-the-counter and prescription medicines only as told by your health care provider. Follow directions carefully. Blood pressure medicines must be taken as prescribed.  Do not skip doses of blood pressure medicine. Doing this puts you at risk for problems and can make the medicine less effective.  Ask your health care provider about side effects or reactions to medicines that you should watch for. Contact a health care provider if you:  Think you are having a reaction to a medicine you are taking.  Have headaches that keep coming back (recurring).  Feel dizzy.  Have swelling in your ankles.  Have trouble with your vision. Get help right away if you:  Develop a severe headache or confusion.  Have unusual weakness or numbness.  Feel faint.  Have severe pain in your chest or abdomen.  Vomit repeatedly.  Have trouble breathing. Summary  Hypertension is when the force of blood pumping through your arteries is too strong. If this condition is not controlled, it may put you at risk for serious complications.  Your personal target blood pressure may vary  depending on your medical conditions, your age, and other factors. For most people, a normal blood pressure is less than 120/80.  Hypertension is treated with lifestyle changes, medicines, or a combination of both. Lifestyle changes include losing weight, eating a healthy, low-sodium diet, exercising more, and limiting alcohol. This information is not intended to replace advice given to you by your health care provider. Make sure you discuss any questions you have with your health care provider. Document Revised: 10/12/2017 Document Reviewed: 10/12/2017 Elsevier Patient Education  2020 Elsevier Inc.      Agustina Caroli, MD [facility]

## 2019-10-09 NOTE — Assessment & Plan Note (Signed)
New onset hypertension.  Cardiovascular risks associated with this condition discussed.  We will start amlodipine 5 mg daily.  Advised to continue monitoring blood pressure readings at home.  Diet and nutrition discussed. Follow-up in 3 months.

## 2019-10-29 ENCOUNTER — Observation Stay (HOSPITAL_COMMUNITY)
Admission: EM | Admit: 2019-10-29 | Discharge: 2019-10-31 | Disposition: A | Discharge status: Home / Self Care | Payer: Medicare PPO | Attending: Family Medicine | Admitting: Family Medicine

## 2019-10-29 ENCOUNTER — Encounter: Payer: Self-pay | Admitting: Emergency Medicine

## 2019-10-29 ENCOUNTER — Other Ambulatory Visit: Payer: Self-pay

## 2019-10-29 ENCOUNTER — Encounter (HOSPITAL_COMMUNITY): Payer: Self-pay | Admitting: Student

## 2019-10-29 ENCOUNTER — Ambulatory Visit (INDEPENDENT_AMBULATORY_CARE_PROVIDER_SITE_OTHER): Payer: Medicare PPO

## 2019-10-29 ENCOUNTER — Ambulatory Visit: Payer: Medicare PPO | Admitting: Emergency Medicine

## 2019-10-29 VITALS — BP 151/85 | HR 83 | Temp 98.0°F | Resp 16 | Ht 67.0 in | Wt 196.0 lb

## 2019-10-29 DIAGNOSIS — Z79899 Other long term (current) drug therapy: Secondary | ICD-10-CM | POA: Insufficient documentation

## 2019-10-29 DIAGNOSIS — I10 Essential (primary) hypertension: Secondary | ICD-10-CM | POA: Diagnosis present

## 2019-10-29 DIAGNOSIS — J90 Pleural effusion, not elsewhere classified: Secondary | ICD-10-CM

## 2019-10-29 DIAGNOSIS — R079 Chest pain, unspecified: Secondary | ICD-10-CM

## 2019-10-29 DIAGNOSIS — E669 Obesity, unspecified: Secondary | ICD-10-CM | POA: Diagnosis not present

## 2019-10-29 DIAGNOSIS — Z6830 Body mass index (BMI) 30.0-30.9, adult: Secondary | ICD-10-CM

## 2019-10-29 DIAGNOSIS — Z8546 Personal history of malignant neoplasm of prostate: Secondary | ICD-10-CM | POA: Diagnosis not present

## 2019-10-29 DIAGNOSIS — I2510 Atherosclerotic heart disease of native coronary artery without angina pectoris: Secondary | ICD-10-CM

## 2019-10-29 DIAGNOSIS — Z9889 Other specified postprocedural states: Secondary | ICD-10-CM

## 2019-10-29 DIAGNOSIS — Z7982 Long term (current) use of aspirin: Secondary | ICD-10-CM | POA: Insufficient documentation

## 2019-10-29 DIAGNOSIS — J9811 Atelectasis: Secondary | ICD-10-CM | POA: Diagnosis not present

## 2019-10-29 DIAGNOSIS — R0600 Dyspnea, unspecified: Secondary | ICD-10-CM

## 2019-10-29 DIAGNOSIS — Z20822 Contact with and (suspected) exposure to covid-19: Secondary | ICD-10-CM | POA: Insufficient documentation

## 2019-10-29 DIAGNOSIS — R0609 Other forms of dyspnea: Secondary | ICD-10-CM

## 2019-10-29 DIAGNOSIS — R0602 Shortness of breath: Secondary | ICD-10-CM | POA: Diagnosis not present

## 2019-10-29 LAB — CBC WITH DIFFERENTIAL/PLATELET
Abs Immature Granulocytes: 0.04 10*3/uL (ref 0.00–0.07)
Basophils Absolute: 0 10*3/uL (ref 0.0–0.1)
Basophils Relative: 0 %
Eosinophils Absolute: 0.1 10*3/uL (ref 0.0–0.5)
Eosinophils Relative: 2 %
HCT: 45.1 % (ref 39.0–52.0)
Hemoglobin: 15.7 g/dL (ref 13.0–17.0)
Immature Granulocytes: 1 %
Lymphocytes Relative: 24 %
Lymphs Abs: 1.7 10*3/uL (ref 0.7–4.0)
MCH: 31.1 pg (ref 26.0–34.0)
MCHC: 34.8 g/dL (ref 30.0–36.0)
MCV: 89.3 fL (ref 80.0–100.0)
Monocytes Absolute: 0.8 10*3/uL (ref 0.1–1.0)
Monocytes Relative: 11 %
Neutro Abs: 4.2 10*3/uL (ref 1.7–7.7)
Neutrophils Relative %: 62 %
Platelets: 240 10*3/uL (ref 150–400)
RBC: 5.05 MIL/uL (ref 4.22–5.81)
RDW: 11.6 % (ref 11.5–15.5)
WBC: 6.8 10*3/uL (ref 4.0–10.5)
nRBC: 0 % (ref 0.0–0.2)

## 2019-10-29 LAB — COMPREHENSIVE METABOLIC PANEL
ALT: 18 U/L (ref 0–44)
AST: 17 U/L (ref 15–41)
Albumin: 4 g/dL (ref 3.5–5.0)
Alkaline Phosphatase: 65 U/L (ref 38–126)
Anion gap: 12 (ref 5–15)
BUN: 13 mg/dL (ref 8–23)
CO2: 24 mmol/L (ref 22–32)
Calcium: 8.8 mg/dL — ABNORMAL LOW (ref 8.9–10.3)
Chloride: 101 mmol/L (ref 98–111)
Creatinine, Ser: 0.68 mg/dL (ref 0.61–1.24)
GFR calc Af Amer: >60 mL/min (ref >60)
GFR calc non Af Amer: >60 mL/min (ref >60)
Glucose, Bld: 115 mg/dL — ABNORMAL HIGH (ref 70–99)
Potassium: 3.5 mmol/L (ref 3.5–5.1)
Sodium: 137 mmol/L (ref 135–145)
Total Bilirubin: 0.7 mg/dL (ref 0.3–1.2)
Total Protein: 7.3 g/dL (ref 6.5–8.1)

## 2019-10-29 LAB — BRAIN NATRIURETIC PEPTIDE: B Natriuretic Peptide: 20.4 pg/mL (ref 0.0–100.0)

## 2019-10-29 LAB — TROPONIN I (HIGH SENSITIVITY)
Troponin I (High Sensitivity): 6 ng/L (ref <18)
Troponin I (High Sensitivity): 6 ng/L (ref <18)

## 2019-10-29 LAB — SARS CORONAVIRUS 2 BY RT PCR (HOSPITAL ORDER, PERFORMED IN ~~LOC~~ HOSPITAL LAB): SARS Coronavirus 2: NEGATIVE

## 2019-10-29 MED ORDER — LISINOPRIL 20 MG PO TABS
20.0000 mg | ORAL_TABLET | Freq: Every day | ORAL | Status: DC
  Administered 2019-10-30 – 2019-10-31 (×2): 20 mg via ORAL
  Filled 2019-10-29 (×3): qty 1

## 2019-10-29 MED ORDER — ROSUVASTATIN CALCIUM 10 MG PO TABS
10.0000 mg | ORAL_TABLET | Freq: Every day | ORAL | Status: DC
  Administered 2019-10-30 – 2019-10-31 (×2): 10 mg via ORAL
  Filled 2019-10-29 (×4): qty 1

## 2019-10-29 MED ORDER — ASPIRIN EC 81 MG PO TBEC: 81.0000 mg | DELAYED_RELEASE_TABLET | ORAL | Status: DC

## 2019-10-29 MED ORDER — LISINOPRIL 20 MG PO TABS: 20.0000 mg | ORAL_TABLET | Freq: Every day | ORAL | 3 refills | Status: DC

## 2019-10-29 MED ORDER — ROSUVASTATIN CALCIUM 10 MG PO TABS: 10.0000 mg | ORAL_TABLET | Freq: Every day | ORAL | 3 refills | Status: DC

## 2019-10-29 NOTE — Patient Instructions (Addendum)
If you have lab work done today you will be contacted with your lab results within the next 2 weeks.  If you have not heard from [place] then please contact us. The fastest way to get your results is to register for My Chart.   IF you received an x-ray today, you will receive an invoice from [facility]. Please contact [facility] at [phone] with questions or concerns regarding your invoice.   IF you received labwork today, you will receive an invoice from [place]. Please contact [facility] at [phone] with questions or concerns regarding your invoice.   Our billing staff will not be able to assist you with questions regarding bills from these companies.  You will be contacted with the lab results as soon as they are available. The fastest way to get your results is to activate your My Chart account. Instructions are located on the last page of this paperwork. If you have not heard from [place] regarding the results in 2 weeks, please contact this office.    Use consistent with Pleural Effusion Pleural effusion is an abnormal buildup of fluid in the layers of tissue between the lungs and the inside of the chest (pleural space) The two layers of tissue that line the lungs and the inside of the chest are called pleura. Usually, there is no air in the space between the pleura, only a thin layer of fluid. Some conditions can cause a large amount of fluid to build up, which can cause the lung to collapse if untreated. A pleural effusion is usually caused by another disease that requires treatment. What are the causes? Pleural effusion can be caused by:  Heart failure.  Certain infections, such as pneumonia or tuberculosis.  Cancer.  A blood clot in the lung (pulmonary embolism).  Complications from surgery, such as from open heart surgery.  Liver disease (cirrhosis).  Kidney disease. What are the signs or symptoms? In some cases, pleural effusion may cause no  symptoms. If symptoms are present, they may include:  Shortness of breath, especially when lying down.  Chest pain. This may get worse when taking a deep breath.  Fever.  Dry, long-lasting (chronic) cough.  Hiccups.  Rapid breathing. An underlying condition that is causing the pleural effusion (such as heart failure, pneumonia, blood clots, tuberculosis, or cancer) may also cause other symptoms. How is this diagnosed? This condition may be diagnosed based on:  Your symptoms and medical history.  A physical exam.  A chest X-ray.  A procedure to use a needle to remove fluid from the pleural space (thoracentesis). This fluid is tested.  Other imaging studies of the chest, such as ultrasound or CT scan. How is this treated? Depending on the cause of your condition, treatment may include:  Treating the underlying condition that is causing the effusion. When that condition improves, the effusion will also improve. Examples of treatment for underlying conditions include: ? Antibiotic medicines to treat an infection. ? Diuretics or other heart medicines to treat heart failure.  Thoracentesis.  Placing a thin flexible tube under your skin and into your chest to continuously drain the effusion (indwelling pleural catheter).  Surgery to remove the outer layer of tissue from the pleural space (decortication).  A procedure to put medicine into the chest cavity to seal the pleural space and prevent fluid buildup (pleurodesis).  Chemotherapy and radiation therapy, if you have cancerous (malignant) pleural effusion. These treatments are typically used to treat cancer. They kill certain  cells in the body. Follow these instructions at home:  Take over-the-counter and prescription medicines only as told by your health care provider.  Ask your health care provider what activities are safe for you.  Keep track of how long you are able to do mild exercise (such as walking) before you get  short of breath. Write down this information to share with your health care provider. Your ability to exercise should improve over time.  Do not use any products that contain nicotine or tobacco, such as cigarettes and e-cigarettes. If you need help quitting, ask your health care provider.  Keep all follow-up visits as told by your health care provider. This is important. Contact a health care provider if:  The amount of time that you are able to do mild exercise: ? Decreases. ? Does not improve with time.  You have a fever. Get help right away if:  You are short of breath.  You develop chest pain.  You develop a new cough. Summary  Pleural effusion is an abnormal buildup of fluid in the layers of tissue between the lungs and the inside of the chest.  Pleural effusion can have many causes, including heart failure, pulmonary embolism, infections, or cancer.  Symptoms of pleural effusion can include shortness of breath, chest pain, fever, long-lasting (chronic) cough, hiccups, or rapid breathing.  Diagnosis often involves making images of the chest (such as with ultrasound or X-ray) and removing fluid (thoracentesis) to send for testing.  Treatment for pleural effusion depends on what underlying condition is causing it. This information is not intended to replace advice given to you by your health care provider. Make sure you discuss any questions you have with your health care provider. Document Revised: 01/14/2017 Document Reviewed: 10/07/2016 Elsevier Patient Education  2020 [employer].

## 2019-10-29 NOTE — ED Provider Notes (Addendum)
[facility] DEPT Provider Note   CSN: [id number] Arrival date & time: 10/29/19  1652     History Chief Complaint  Patient presents with  . Shortness of Breath    Jack Haynes is a 79 y.o. male with a history of prostate cancer in remission S/p surgical intervention & hypertension who presents to the ED with complaints of dyspnea x 2 weeks. Patient reports dyspnea is fairly constant, worse with activity & supine position, no alleviating factors. Has had some mild R sided chest discomfort when dyspnea gets really bad. Denies fever, chills, coughing, hemoptysis, leg pain/swelling, recent surgery/trauma, recent long travel, hormone use, or hx of DVT/PE.  Utilizes chewing tobacco, he used to smoke cigarettes but quit in the 60s.  HPI     Past Medical History:  Diagnosis Date  . Arthritis   . Cancer ([place])    hx of prostatae cancer    Patient Active Problem List   Diagnosis Date Noted  . Atherosclerotic cardiovascular disease 10/29/2019  . Essential hypertension 10/09/2019  . Abnormal EKG 01/02/2015  . Prostate cancer ([place]) 11/01/2011    Past Surgical History:  Procedure Laterality Date  . APPENDECTOMY    . BACK SURGERY  1998  . EYE SURGERY Left 1965  . JOINT REPLACEMENT Left 2010   left knee replacement  . KNEE SURGERY    . PROSTATE SURGERY    . SPINE SURGERY    . TOTAL KNEE ARTHROPLASTY  02/01/2012   Procedure: TOTAL KNEE ARTHROPLASTY;  Surgeon: Sydnee Cabal, MD;  Location: [facility];  Service: Orthopedics;  Laterality: Left;       Family History  Problem Relation Age of Onset  . Cancer Sister        brain tumor  . Heart disease Brother   . Alzheimer's disease Brother   . Heart disease Sister   . Alzheimer's disease Father   . Hypertension Sister   . Colon cancer Neg Hx   . Colon polyps Neg Hx   . Esophageal cancer Neg Hx   . Rectal cancer Neg Hx   . Stomach cancer Neg Hx     Social History   Tobacco Use  . Smoking  status: Former Smoker    Quit date: 02/15/1981    Years since quitting: 38.7  . Smokeless tobacco: Current User    Types: Chew  Substance Use Topics  . Alcohol use: Yes    Alcohol/week: 5.0 standard drinks    Types: 5 Shots of liquor per week    Comment:  beer or mixed drink  . Drug use: No    Home Medications Prior to Admission medications   Medication Sig Start Date End Date Taking? Authorizing Provider  acetic acid-hydrocortisone (VOSOL-HC) OTIC solution Place 3 drops into the left ear 3 (three) times daily. Patient not taking: Reported on 10/29/2019 04/01/17   [name], MD  amLODipine (NORVASC) 5 MG tablet Take 1 tablet (5 mg total) by mouth daily. 10/09/19   [name], MD  aspirin EC 81 MG tablet Take 81 mg by mouth daily. Swallow whole.    [provider]  carboxymethylcellulose (REFRESH PLUS) 0.5 % SOLN Place 1 drop into the left eye daily.    [provider]  dicyclomine (BENTYL) 10 MG capsule Take 1 capsule (10 mg total) by mouth 2 (two) times daily before a meal for 7 days. 09/19/18 09/26/18  [name], MD  lisinopril (ZESTRIL) 20 MG tablet Take 1 tablet (20  mg total) by mouth daily. 10/29/19   [name], MD  meclizine (ANTIVERT) 25 MG tablet Take 1 tablet (25 mg total) by mouth 3 (three) times daily as needed for dizziness. Patient not taking: Reported on 10/29/2019 08/14/18   Fransico Meadow, PA-C  Misc Natural Products (NEURIVA PO) Take by mouth daily.    [provider]  Multiple Vitamin (MULTIVITAMIN) tablet Take 1 tablet by mouth daily. Patient not taking: Reported on 10/29/2019    [provider]  naproxen sodium (ANAPROX) 220 MG tablet Take 220 mg by mouth as needed.     [provider]  rosuvastatin (CRESTOR) 10 MG tablet Take 1 tablet (10 mg total) by mouth daily. 10/29/19   [name], MD    Allergies    Patient has no known allergies.  Review of Systems   Review of  Systems  Constitutional: Negative for chills and fever.  Respiratory: Positive for shortness of breath. Negative for cough.        Positive for orthopnea.  Cardiovascular: Positive for chest pain. Negative for leg swelling.  Gastrointestinal: Negative for abdominal distention, abdominal pain, constipation, diarrhea, nausea and vomiting.  Genitourinary: Negative for dysuria.  Neurological: Negative for syncope.  All other systems reviewed and are negative.   Physical Exam Updated Vital Signs BP (!) 170/90 (BP Location: Left Arm)   Pulse 92   Temp 98.5 F (36.9 C) (Oral)   Resp 16   SpO2 97%   Physical Exam Vitals and nursing note reviewed.  Constitutional:      General: He is not in acute distress.    Appearance: He is well-developed. He is not toxic-appearing.  HENT:     Head: Normocephalic and atraumatic.  Eyes:     General:        Right eye: No discharge.        Left eye: No discharge.     Conjunctiva/sclera: Conjunctivae normal.  Cardiovascular:     Rate and Rhythm: Normal rate and regular rhythm.  Pulmonary:     Effort: Pulmonary effort is normal. No respiratory distress.     Breath sounds: Decreased breath sounds (Left mid and lower lung fields.) present. No wheezing, rhonchi or rales.  Abdominal:     General: There is no distension.     Palpations: Abdomen is soft.     Tenderness: There is no abdominal tenderness.  Musculoskeletal:     Cervical back: Neck supple.     Right lower leg: No tenderness. No edema.     Left lower leg: No tenderness. No edema.  Skin:    General: Skin is warm and dry.     Findings: No rash.  Neurological:     Mental Status: He is alert.     Comments: Clear speech.   Psychiatric:        Behavior: Behavior normal.    ED Results / Procedures / Treatments   Labs (all labs ordered are listed, but only abnormal results are displayed) Labs Reviewed  COMPREHENSIVE METABOLIC PANEL - Abnormal; Notable for the following components:       Result Value   Glucose, Bld 115 (*)    Calcium 8.8 (*)    All other components within normal limits  SARS CORONAVIRUS 2 BY RT PCR (HOSPITAL ORDER, [facility])  CBC WITH DIFFERENTIAL/PLATELET  BRAIN NATRIURETIC PEPTIDE  TROPONIN I (HIGH SENSITIVITY)  TROPONIN I (HIGH SENSITIVITY)    EKG None  Radiology DG Chest 2  View  Result Date: 10/29/2019 CLINICAL DATA:  Dyspnea on exertion. EXAM: CHEST - 2 VIEW COMPARISON:  January 06, 2010. FINDINGS: Interval development of large left pleural effusion resulting in left to right midline shift. Aerated left upper lobe is noted. No pneumothorax is noted. Right lung is clear. Bony thorax is unremarkable. IMPRESSION: Interval development of large left pleural effusion resulting in left to right midline shift. Electronically Signed   By: Marijo Conception M.D.   On: 10/29/2019 15:38    Procedures Procedures (including critical care time)  Medications Ordered in ED Medications - No data to display  ED Course  I have reviewed the triage vital signs and the nursing notes.  Pertinent labs & imaging results that were available during my care of the patient were reviewed by me and considered in my medical decision making (see chart for details).    MDM Rules/Calculators/A&P                         Patient presents to the ED with complaints of shortness of breath for the past 2 weeks.  Patient is nontoxic, resting comfortably, his blood pressure is noted to be elevated.  On exam he has decreased breath sounds to the left mid and lower lung field.  Does not appear to be in respiratory distress.  Exam is otherwise benign.  Additional history obtained:  Additional history obtained from chart review. Previous records obtained and reviewed: Patient had outpatient chest x-ray which I personally reviewed and interpreted, reveals Interval development of large left pleural effusion resulting in left to right midline shift.  EKG: No  STEMI Lab Tests:  I Ordered, reviewed, and interpreted labs, which included:  CBC: No significant anemia or leukocytosis.  CMP: No significant electrolyte derangement, mild hypocalcemia.  Troponin: WNL BNP: WNL COVID: Negative  Reassuring EKG & labs.  Placed order for IR thoracentesis.  Will discuss w/ hospitalist for admission.   19:50: CONSULT: Discussed with hospitalist Dr. Flossie Buffy- accepts admission.   Findings and plan of care discussed with supervising physician Dr. Stark Jock who is in agreement.   Portions of this note were generated with Lobby[employer]. Dictation errors may occur despite best attempts at proofreading.  Final Clinical Impression(s) / ED Diagnoses Final diagnoses:  Pleural effusion    Rx / DC Orders ED Discharge Orders    None       Amaryllis Dyke, PA-C 10/29/19 2002  Thoracentesis order changed per radiology recommendation for proper order.     Leafy Kindle 10/29/19 2115    Veryl Speak, MD 10/29/19 2135

## 2019-10-29 NOTE — H&P (Addendum)
History and Physical    Jack Haynes XBD:[id number] DOB: 1941-01-23 DOA: 10/29/2019  PCP: [name], MD  Patient coming from: Primary care physician office  I have personally briefly reviewed patient's old medical records in [place]  Chief Complaint: Large left-sided pleural effusion  HPI: Jack Haynes is a 79 y.o. male with medical history significant for hypertension, CAD, remote history of prostate cancer s/p prostatectomy who presents from PCP office for concerns of large left-sided pleural effusion.  Patient states that he is normally very active and healthy.  He presented for an annual physical in August and was found to have elevated blood pressure.  He has been checking it at home and noted to sometimes be around 150 over 90s.  About 3 weeks ago he was placed on an antihypertensive by his PCP.  Then the week following he began to notice fatigue and dyspnea with exertion.  Normally he power walks every day without issue.  Then over the weekend he began to have trouble laying flat and had to sit up to sleep.  Has some chest discomfort but not really pain.  He saw his PCP today with the symptoms and had chest x-ray showing large left pleural effusion with left to right midline shift and was asked to present to the ED.  Denies any lower extremity edema.  Patient previously was a smoker but quit back in the 60s although he currently still chews tobacco.  Denies any weight loss.  Has occasional alcohol use.  Denies any illicit drug use.  ED Course: Patient was well-appearing and in a very pleasant mood.  He was mildly tachypneic but remained stable on room air.  He was mildly hypertensive with systolic of 834 over 19Q.  CBC and CMP unremarkable.  BNP of 20.  Troponin of 6.  EKG with normal sinus rhythm without any ST or T wave changes.  Review of Systems:  Constitutional: No Weight Change, No Fever ENT/Mouth: No sore throat, No Rhinorrhea Eyes: No Eye Pain, No  Vision Changes Cardiovascular: No Chest Pain, + SOB, No PND, + Dyspnea on Exertion, + Orthopnea, No Claudication, No Edema, No Palpitations Respiratory: No Cough, No Sputum Gastrointestinal: No Nausea, No Vomiting, No Diarrhea Genitourinary: no Urinary Incontinence, Musculoskeletal: No Arthralgias, No Myalgias Skin: No Skin Lesions, No Pruritus, Neuro: no Weakness, No Numbness Psych: No Anxiety/Panic, No Depression, no decrease appetite Heme/Lymph: No Bruising, No Bleeding  Past Medical History:  Diagnosis Date  . Arthritis   . Cancer ([place])    hx of prostatae cancer    Past Surgical History:  Procedure Laterality Date  . APPENDECTOMY    . BACK SURGERY  1998  . EYE SURGERY Left 1965  . JOINT REPLACEMENT Left 2010   left knee replacement  . KNEE SURGERY    . PROSTATE SURGERY    . SPINE SURGERY    . TOTAL KNEE ARTHROPLASTY  02/01/2012   Procedure: TOTAL KNEE ARTHROPLASTY;  Surgeon: Sydnee Cabal, MD;  Location: [facility];  Service: Orthopedics;  Laterality: Left;     reports that he quit smoking about 38 years ago. His smokeless tobacco use includes chew. He reports current alcohol use of about 5.0 standard drinks of alcohol per week. He reports that he does not use drugs. Social History  No Known Allergies  Family History  Problem Relation Age of Onset  . Cancer Sister        brain tumor  . Heart disease Brother   .  Alzheimer's disease Brother   . Heart disease Sister   . Alzheimer's disease Father   . Hypertension Sister   . Colon cancer Neg Hx   . Colon polyps Neg Hx   . Esophageal cancer Neg Hx   . Rectal cancer Neg Hx   . Stomach cancer Neg Hx      Prior to Admission medications   Medication Sig Start Date End Date Taking? Authorizing Provider  acetic acid-hydrocortisone (VOSOL-HC) OTIC solution Place 3 drops into the left ear 3 (three) times daily. Patient not taking: Reported on 10/29/2019 04/01/17   [name], MD  amLODipine (NORVASC) 5 MG tablet  Take 1 tablet (5 mg total) by mouth daily. 10/09/19   [name], MD  aspirin EC 81 MG tablet Take 81 mg by mouth daily. Swallow whole.    [provider]  carboxymethylcellulose (REFRESH PLUS) 0.5 % SOLN Place 1 drop into the left eye daily.    [provider]  dicyclomine (BENTYL) 10 MG capsule Take 1 capsule (10 mg total) by mouth 2 (two) times daily before a meal for 7 days. 09/19/18 09/26/18  [name], MD  lisinopril (ZESTRIL) 20 MG tablet Take 1 tablet (20 mg total) by mouth daily. 10/29/19   [name], MD  meclizine (ANTIVERT) 25 MG tablet Take 1 tablet (25 mg total) by mouth 3 (three) times daily as needed for dizziness. Patient not taking: Reported on 10/29/2019 08/14/18   Fransico Meadow, PA-C  Misc Natural Products (NEURIVA PO) Take by mouth daily.    [provider]  Multiple Vitamin (MULTIVITAMIN) tablet Take 1 tablet by mouth daily. Patient not taking: Reported on 10/29/2019    [provider]  naproxen sodium (ANAPROX) 220 MG tablet Take 220 mg by mouth as needed.     [provider]  rosuvastatin (CRESTOR) 10 MG tablet Take 1 tablet (10 mg total) by mouth daily. 10/29/19   [name], MD    Physical Exam: Vitals:   10/29/19 2000 10/29/19 2015 10/29/19 2030 10/29/19 2045  BP: (!) 157/95 (!) 155/130 (!) 155/91 (!) 152/95  Pulse: 92 83 81 82  Resp: (!) 22 19 (!) 21 (!) 22  Temp:      TempSrc:      SpO2: 94% 96% 95% 93%  Weight:      Height:        Constitutional: NAD, calm, comfortable, pleasant elderly gentleman appearing younger than stated age sitting upright at 40 degree incline in bed Vitals:   10/29/19 2000 10/29/19 2015 10/29/19 2030 10/29/19 2045  BP: (!) 157/95 (!) 155/130 (!) 155/91 (!) 152/95  Pulse: 92 83 81 82  Resp: (!) 22 19 (!) 21 (!) 22  Temp:      TempSrc:      SpO2: 94% 96% 95% 93%  Weight:      Height:       Eyes: PERRL, lids and conjunctivae normal ENMT: Mucous  membranes are moist.  Neck: normal, supple Respiratory: Decreased aeration throughout left upper and lower lobe but no wheezing, no crackles. Normal respiratory effort on room air. No accessory muscle use.  Cardiovascular: Regular rate and rhythm, no murmurs / rubs / gallops. No extremity edema. 2+ pedal pulses.   Abdomen: no tenderness, no masses palpated.Bowel sounds positive.  Musculoskeletal: no clubbing / cyanosis. No joint deformity upper and lower extremities. Good ROM, no contractures. Normal muscle tone.  Skin: no rashes, lesions, ulcers. No induration Neurologic: CN 2-12  grossly intact. Sensation intact,. Strength 5/5 in all 4.  Psychiatric: Normal judgment and insight. Alert and oriented x 3. Normal pleasant and talkative mood.     Labs on Admission: I have personally reviewed following labs and imaging studies  CBC: Recent Labs  Lab 10/29/19 1744  WBC 6.8  NEUTROABS 4.2  HGB 15.7  HCT 45.1  MCV 89.3  PLT 425   Basic Metabolic Panel: Recent Labs  Lab 10/29/19 1744  NA 137  K 3.5  CL 101  CO2 24  GLUCOSE 115*  BUN 13  CREATININE 0.68  CALCIUM 8.8*   GFR: Estimated Creatinine Clearance: 80.9 mL/min (by C-G formula based on SCr of 0.68 mg/dL). Liver Function Tests: Recent Labs  Lab 10/29/19 1744  AST 17  ALT 18  ALKPHOS 65  BILITOT 0.7  PROT 7.3  ALBUMIN 4.0   No results for input(s): LIPASE, AMYLASE in the last 168 hours. No results for input(s): AMMONIA in the last 168 hours. Coagulation Profile: No results for input(s): INR, PROTIME in the last 168 hours. Cardiac Enzymes: No results for input(s): CKTOTAL, CKMB, CKMBINDEX, TROPONINI in the last 168 hours. BNP (last 3 results) No results for input(s): PROBNP in the last 8760 hours. HbA1C: No results for input(s): HGBA1C in the last 72 hours. CBG: No results for input(s): GLUCAP in the last 168 hours. Lipid Profile: No results for input(s): CHOL, HDL, LDLCALC, TRIG, CHOLHDL, LDLDIRECT in the  last 72 hours. Thyroid Function Tests: No results for input(s): TSH, T4TOTAL, FREET4, T3FREE, THYROIDAB in the last 72 hours. Anemia Panel: No results for input(s): VITAMINB12, FOLATE, FERRITIN, TIBC, IRON, RETICCTPCT in the last 72 hours. Urine analysis:    Component Value Date/Time   COLORURINE YELLOW 01/27/2012 0818   APPEARANCEUR CLEAR 01/27/2012 0818   LABSPEC 1.018 01/27/2012 0818   PHURINE 6.0 01/27/2012 0818   GLUCOSEU NEGATIVE 01/27/2012 0818   HGBUR NEGATIVE 01/27/2012 0818   BILIRUBINUR negative 12/24/2014 0848   BILIRUBINUR neg 08/21/2012 1330   KETONESUR negative 12/24/2014 0848   KETONESUR NEGATIVE 01/27/2012 0818   PROTEINUR negative 12/24/2014 0848   PROTEINUR neg 08/21/2012 1330   PROTEINUR NEGATIVE 01/27/2012 0818   UROBILINOGEN 0.2 12/24/2014 0848   UROBILINOGEN 0.2 01/27/2012 0818   NITRITE Negative 12/24/2014 0848   NITRITE neg 08/21/2012 1330   NITRITE NEGATIVE 01/27/2012 0818   LEUKOCYTESUR Negative 12/24/2014 0848    Radiological Exams on Admission: DG Chest 2 View  Result Date: 10/29/2019 CLINICAL DATA:  Dyspnea on exertion. EXAM: CHEST - 2 VIEW COMPARISON:  January 06, 2010. FINDINGS: Interval development of large left pleural effusion resulting in left to right midline shift. Aerated left upper lobe is noted. No pneumothorax is noted. Right lung is clear. Bony thorax is unremarkable. IMPRESSION: Interval development of large left pleural effusion resulting in left to right midline shift. Electronically Signed   By: Marijo Conception M.D.   On: 10/29/2019 15:38      Assessment/Plan  Large left-sided pleural effusion with left to right midline shift Hemodynamically stable Patient has daily tobacco use and risk factor for malignancy. IR consult placed for thoracentesis with fluid studies will also obtain echocardiogram as he recently was diagnosed with hypertension  Hypertension Continue antihypertensive.  Previously was placed on amlodipine and  switch to lisinopril.  Will continue lisinopril.  Remote history of prostate cancer s/p prostatectomy No active issues  DVT prophylaxis:.SCD   code Status: Full Family Communication: Plan discussed with patient at bedside  disposition Plan: Home with  observation Consults called:  Admission status: Observation  Status is: Observation  The patient remains OBS appropriate and will d/c before 2 midnights.  Dispo: The patient is from: Home              Anticipated d/c is to: Home              Anticipated d/c date is: 1 day              Patient currently is not medically stable to d/c.         Orene Desanctis DO [facility]   If 7PM-7AM, please contact night-coverage [email or web address]   10/29/2019, 9:09 PM

## 2019-10-29 NOTE — Progress Notes (Signed)
Jack Haynes 79 y.o.   Chief Complaint  Patient presents with  . Hypertension    follow up blood pressure monitoring at home    HISTORY OF PRESENT ILLNESS: This is a 79 y.o. male complaining of elevated blood pressure readings at home.  Presently on amlodipine 5 mg daily. Also complaining of several other symptoms: Complaining of intermittent episodes of neck pain and tightness across the chest, sometimes at rest, sometimes on exertion.  Also complaining of dyspnea on exertion and lack of energy, ear fullness and drainage in his throat. Stopped smoking in the 12s.  No history of diabetes.  Takes daily baby aspirin.  Has history of prostate cancer. BP Readings from Last 3 Encounters:  10/29/19 (!) 151/85  10/09/19 134/90  09/27/19 (!) 153/79   The 10-year ASCVD risk score Mikey Bussing DC Jr., et al., 2013) is: 43.7%   Values used to calculate the score:     Age: 88 years     Sex: Male     Is Non-Hispanic African American: No     Diabetic: No     Tobacco smoker: No     Systolic Blood Pressure: 527 mmHg     Is BP treated: Yes     HDL Cholesterol: 40 mg/dL     Total Cholesterol: 184 mg/dL   HPI   Prior to Admission medications   Medication Sig Start Date End Date Taking? Authorizing Provider  amLODipine (NORVASC) 5 MG tablet Take 1 tablet (5 mg total) by mouth daily. 10/09/19  Yes [name], MD  aspirin EC 81 MG tablet Take 81 mg by mouth daily. Swallow whole.   Yes [provider]  carboxymethylcellulose (REFRESH PLUS) 0.5 % SOLN Place 1 drop into the left eye daily.   Yes [provider]  Misc Natural Products (NEURIVA PO) Take by mouth daily.   Yes [provider]  naproxen sodium (ANAPROX) 220 MG tablet Take 220 mg by mouth as needed.    Yes [provider]  acetic acid-hydrocortisone (VOSOL-HC) OTIC solution Place 3 drops into the left ear 3 (three) times daily. Patient not taking: Reported on 10/29/2019 04/01/17   [name], MD  dicyclomine (BENTYL) 10 MG capsule Take 1 capsule (10 mg total) by mouth 2 (two) times daily before a meal for 7 days. 09/19/18 09/26/18  [name], MD  meclizine (ANTIVERT) 25 MG tablet Take 1 tablet (25 mg total) by mouth 3 (three) times daily as needed for dizziness. Patient not taking: Reported on 10/29/2019 08/14/18   Fransico Meadow, PA-C  Multiple Vitamin (MULTIVITAMIN) tablet Take 1 tablet by mouth daily. Patient not taking: Reported on 10/29/2019    [provider]    No Known Allergies  Patient Active Problem List   Diagnosis Date Noted  . Essential hypertension 10/09/2019  . Abnormal EKG 01/02/2015  . Prostate cancer ([place]) 11/01/2011    Past Medical History:  Diagnosis Date  . Arthritis   . Cancer ([place])    hx of prostatae cancer    Past Surgical History:  Procedure Laterality Date  . APPENDECTOMY    . BACK SURGERY  1998  . EYE SURGERY Left 1965  . JOINT REPLACEMENT Left 2010   left knee replacement  . KNEE SURGERY    . PROSTATE SURGERY    . SPINE SURGERY    . TOTAL KNEE ARTHROPLASTY  02/01/2012   Procedure: TOTAL KNEE ARTHROPLASTY;  Surgeon: Sydnee Cabal, MD;  Location: [facility];  Service: Orthopedics;  Laterality: Left;    Social History   Socioeconomic History  . Marital status: Divorced    Spouse name: Not on file  . Number of children: 2  . Years of education: 58  . Highest education level: Not on file  Occupational History  . 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   Tobacco Use  . Smoking status: Former Smoker    Quit date: 02/15/1981    Years since quitting: 38.7  . Smokeless tobacco: Current User    Types: Chew  Substance and Sexual Activity  . Alcohol use: Yes    Alcohol/week: 5.0 standard drinks    Types: 5 Shots of liquor per week    Comment:  beer or mixed drink  . Drug use: No  . Sexual activity: Not on file  Other Topics Concern  . Not on file  Social History Narrative  . Not on file    Social Determinants of Health   Financial Resource Strain:   . Difficulty of Paying Living Expenses: Not on file  Food Insecurity:   . Worried About Charity fundraiser in the Last Year: Not on file  . Ran Out of Food in the Last Year: Not on file  Transportation Needs:   . Lack of Transportation (Medical): Not on file  . Lack of Transportation (Non-Medical): Not on file  Physical Activity:   . Days of Exercise per Week: Not on file  . Minutes of Exercise per Session: Not on file  Stress:   . Feeling of Stress : Not on file  Social Connections:   . Frequency of Communication with Friends and Family: Not on file  . Frequency of Social Gatherings with Friends and Family: Not on file  . Attends Religious Services: Not on file  . Active Member of Clubs or Organizations: Not on file  . Attends Archivist Meetings: Not on file  . Marital Status: Not on file  Intimate Partner Violence:   . Fear of Current or Ex-Partner: Not on file  . Emotionally Abused: Not on file  . Physically Abused: Not on file  . Sexually Abused: Not on file    Family History  Problem Relation Age of Onset  . Cancer Sister        brain tumor  . Heart disease Brother   . Alzheimer's disease Brother   . Heart disease Sister   . Alzheimer's disease Father   . Hypertension Sister   . Colon cancer Neg Hx   . Colon polyps Neg Hx   . Esophageal cancer Neg Hx   . Rectal cancer Neg Hx   . Stomach cancer Neg Hx      Review of Systems  Constitutional: Negative.  Negative for chills and fever.  HENT: Negative.  Negative for congestion and sore throat.        Bilateral ear fullness  Respiratory: Positive for shortness of breath (Dyspnea on exertion). Negative for cough.   Cardiovascular: Positive for chest pain. Negative for palpitations and leg swelling.  Gastrointestinal: Negative.  Negative for abdominal pain, diarrhea, nausea and vomiting.  Genitourinary: Negative.  Negative for dysuria and  hematuria.  Skin: Negative.  Negative for rash.  Neurological: Negative.  Negative for dizziness, sensory change, speech change, focal weakness, loss of consciousness and headaches.  All other systems reviewed and are negative.    Today's Vitals   10/29/19 1458  BP: (!) 151/85  Pulse: 83  Resp: 16  Temp: 98 F (36.7 C)  TempSrc: Temporal  SpO2: 94%  Weight: 196 lb (88.9 kg)  Height: 5\' 7"  (1.702 m)   Body mass index is 30.7 kg/m.   Physical Exam Vitals reviewed.  Constitutional:      Appearance: Normal appearance.  HENT:     Head: Normocephalic.  Eyes:     Extraocular Movements: Extraocular movements intact.     Pupils: Pupils are equal, round, and reactive to light.  Cardiovascular:     Rate and Rhythm: Normal rate and regular rhythm.  Pulmonary:     Effort: Pulmonary effort is normal.     Breath sounds: Examination of the left-middle field reveals decreased breath sounds. Examination of the left-lower field reveals decreased breath sounds. Decreased breath sounds present.  Musculoskeletal:        General: Normal range of motion.     Cervical back: Normal range of motion and neck supple.  Skin:    General: Skin is warm and dry.     Capillary Refill: Capillary refill takes less than 2 seconds.  Neurological:     General: No focal deficit present.     Mental Status: He is alert and oriented to person, place, and time.  Psychiatric:        Mood and Affect: Mood normal.        Behavior: Behavior normal.    DG Chest 2 View  Result Date: 10/29/2019 CLINICAL DATA:  Dyspnea on exertion. EXAM: CHEST - 2 VIEW COMPARISON:  January 06, 2010. FINDINGS: Interval development of large left pleural effusion resulting in left to right midline shift. Aerated left upper lobe is noted. No pneumothorax is noted. Right lung is clear. Bony thorax is unremarkable. IMPRESSION: Interval development of large left pleural effusion resulting in left to right midline shift. Electronically  Signed   By: Marijo Conception M.D.   On: 10/29/2019 15:38    EKG: Normal sinus rhythm with ventricular rate 81/min and incomplete right bundle branch block.  No acute ischemic changes. ASSESSMENT & PLAN:  Advised to go to [facility] emergency department now for further evaluation and possible admission. Damonte was seen today for hypertension.  Diagnoses and all orders for this visit:  Dyspnea on exertion -     DG Chest 2 View -     Ambulatory referral to Cardiology  Nonspecific chest pain Comments: Suspected angina pectoris Orders: -     EKG 12-Lead -     Ambulatory referral to Cardiology  Uncontrolled hypertension -     Ambulatory referral to Cardiology -     lisinopril (ZESTRIL) 20 MG tablet; Take 1 tablet (20 mg total) by mouth daily.  Body mass index (BMI) of 30.0-30.9 in adult  Atherosclerotic cardiovascular disease -     rosuvastatin (CRESTOR) 10 MG tablet; Take 1 tablet (10 mg total) by mouth daily.  Atelectasis of left lung  Pleural effusion, left  History of prostate cancer     Patient Instructions       If you have lab work done today you will be contacted with your lab results within the next 2 weeks.  If you have not heard from [place] then please contact us. The fastest way to get your results is to register for My Chart.   IF you received an x-ray today, you will receive an invoice from [facility]. Please contact [facility] at [phone] with questions or concerns regarding your invoice.   IF you received labwork today, you will receive  an Pharmacologist from [employer]. Please contact [facility] at [phone] with questions or concerns regarding your invoice.   Our billing staff will not be able to assist you with questions regarding bills from these companies.  You will be contacted with the lab results as soon as they are available. The fastest way to get your results is to activate your My Chart account. Instructions are located on the  last page of this paperwork. If you have not heard from [place] regarding the results in 2 weeks, please contact this office.    Use consistent with Pleural Effusion Pleural effusion is an abnormal buildup of fluid in the layers of tissue between the lungs and the inside of the chest (pleural space) The two layers of tissue that line the lungs and the inside of the chest are called pleura. Usually, there is no air in the space between the pleura, only a thin layer of fluid. Some conditions can cause a large amount of fluid to build up, which can cause the lung to collapse if untreated. A pleural effusion is usually caused by another disease that requires treatment. What are the causes? Pleural effusion can be caused by:  Heart failure.  Certain infections, such as pneumonia or tuberculosis.  Cancer.  A blood clot in the lung (pulmonary embolism).  Complications from surgery, such as from open heart surgery.  Liver disease (cirrhosis).  Kidney disease. What are the signs or symptoms? In some cases, pleural effusion may cause no symptoms. If symptoms are present, they may include:  Shortness of breath, especially when lying down.  Chest pain. This may get worse when taking a deep breath.  Fever.  Dry, long-lasting (chronic) cough.  Hiccups.  Rapid breathing. An underlying condition that is causing the pleural effusion (such as heart failure, pneumonia, blood clots, tuberculosis, or cancer) may also cause other symptoms. How is this diagnosed? This condition may be diagnosed based on:  Your symptoms and medical history.  A physical exam.  A chest X-ray.  A procedure to use a needle to remove fluid from the pleural space (thoracentesis). This fluid is tested.  Other imaging studies of the chest, such as ultrasound or CT scan. How is this treated? Depending on the cause of your condition, treatment may include:  Treating the underlying condition that is causing the effusion.  When that condition improves, the effusion will also improve. Examples of treatment for underlying conditions include: ? Antibiotic medicines to treat an infection. ? Diuretics or other heart medicines to treat heart failure.  Thoracentesis.  Placing a thin flexible tube under your skin and into your chest to continuously drain the effusion (indwelling pleural catheter).  Surgery to remove the outer layer of tissue from the pleural space (decortication).  A procedure to put medicine into the chest cavity to seal the pleural space and prevent fluid buildup (pleurodesis).  Chemotherapy and radiation therapy, if you have cancerous (malignant) pleural effusion. These treatments are typically used to treat cancer. They kill certain cells in the body. Follow these instructions at home:  Take over-the-counter and prescription medicines only as told by your health care provider.  Ask your health care provider what activities are safe for you.  Keep track of how long you are able to do mild exercise (such as walking) before you get short of breath. Write down this information to share with your health care provider. Your ability to exercise should improve over time.  Do not use any products that contain nicotine or  tobacco, such as cigarettes and e-cigarettes. If you need help quitting, ask your health care provider.  Keep all follow-up visits as told by your health care provider. This is important. Contact a health care provider if:  The amount of time that you are able to do mild exercise: ? Decreases. ? Does not improve with time.  You have a fever. Get help right away if:  You are short of breath.  You develop chest pain.  You develop a new cough. Summary  Pleural effusion is an abnormal buildup of fluid in the layers of tissue between the lungs and the inside of the chest.  Pleural effusion can have many causes, including heart failure, pulmonary embolism, infections, or  cancer.  Symptoms of pleural effusion can include shortness of breath, chest pain, fever, long-lasting (chronic) cough, hiccups, or rapid breathing.  Diagnosis often involves making images of the chest (such as with ultrasound or X-ray) and removing fluid (thoracentesis) to send for testing.  Treatment for pleural effusion depends on what underlying condition is causing it. This information is not intended to replace advice given to you by your health care provider. Make sure you discuss any questions you have with your health care provider. Document Revised: 01/14/2017 Document Reviewed: 10/07/2016 Elsevier Patient Education  2020 Elsevier Inc.      Agustina Caroli, MD [facility]

## 2019-10-29 NOTE — ED Notes (Signed)
Pt reports PCP told him that the had a collapsed lung and was told to come here. Patient reports feeling SOB when he walks. Reports has been feeling SOB for the last 2 weeks.

## 2019-10-30 ENCOUNTER — Other Ambulatory Visit: Payer: Self-pay

## 2019-10-30 ENCOUNTER — Observation Stay (HOSPITAL_COMMUNITY): Payer: Medicare PPO

## 2019-10-30 ENCOUNTER — Encounter: Payer: Self-pay | Admitting: Emergency Medicine

## 2019-10-30 ENCOUNTER — Observation Stay (HOSPITAL_BASED_OUTPATIENT_CLINIC_OR_DEPARTMENT_OTHER): Payer: Medicare PPO

## 2019-10-30 DIAGNOSIS — J90 Pleural effusion, not elsewhere classified: Secondary | ICD-10-CM | POA: Diagnosis not present

## 2019-10-30 DIAGNOSIS — J9811 Atelectasis: Secondary | ICD-10-CM | POA: Diagnosis not present

## 2019-10-30 DIAGNOSIS — Z8546 Personal history of malignant neoplasm of prostate: Secondary | ICD-10-CM | POA: Diagnosis not present

## 2019-10-30 DIAGNOSIS — R0602 Shortness of breath: Secondary | ICD-10-CM | POA: Diagnosis not present

## 2019-10-30 LAB — LACTATE DEHYDROGENASE, PLEURAL OR PERITONEAL FLUID: LD, Fluid: 244 U/L — ABNORMAL HIGH (ref 3–23)

## 2019-10-30 LAB — ECHOCARDIOGRAM COMPLETE
Area-P 1/2: 2.39 cm2
Calc EF: 60.5 %
Height: 67 in
S' Lateral: 3.01 cm
Single Plane A2C EF: 58.6 %
Single Plane A4C EF: 67.5 %
Weight: 3136 oz

## 2019-10-30 LAB — BODY FLUID CELL COUNT WITH DIFFERENTIAL
Eos, Fluid: 0 %
Lymphs, Fluid: 70 %
Monocyte-Macrophage-Serous Fluid: 28 % — ABNORMAL LOW (ref 50–90)
Neutrophil Count, Fluid: 2 % (ref 0–25)
Total Nucleated Cell Count, Fluid: 1740 cu mm — ABNORMAL HIGH (ref 0–1000)

## 2019-10-30 LAB — LACTATE DEHYDROGENASE: LDH: 118 U/L (ref 98–192)

## 2019-10-30 LAB — CBC
HCT: 42.4 % (ref 39.0–52.0)
Hemoglobin: 14.8 g/dL (ref 13.0–17.0)
MCH: 31.6 pg (ref 26.0–34.0)
MCHC: 34.9 g/dL (ref 30.0–36.0)
MCV: 90.4 fL (ref 80.0–100.0)
Platelets: 220 10*3/uL (ref 150–400)
RBC: 4.69 MIL/uL (ref 4.22–5.81)
RDW: 11.9 % (ref 11.5–15.5)
WBC: 5.6 10*3/uL (ref 4.0–10.5)
nRBC: 0 % (ref 0.0–0.2)

## 2019-10-30 LAB — PROTEIN, PLEURAL OR PERITONEAL FLUID: Total protein, fluid: 4.1 g/dL

## 2019-10-30 LAB — BASIC METABOLIC PANEL
Anion gap: 8 (ref 5–15)
BUN: 14 mg/dL (ref 8–23)
CO2: 26 mmol/L (ref 22–32)
Calcium: 8.7 mg/dL — ABNORMAL LOW (ref 8.9–10.3)
Chloride: 101 mmol/L (ref 98–111)
Creatinine, Ser: 0.79 mg/dL (ref 0.61–1.24)
GFR calc Af Amer: >60 mL/min (ref >60)
GFR calc non Af Amer: >60 mL/min (ref >60)
Glucose, Bld: 109 mg/dL — ABNORMAL HIGH (ref 70–99)
Potassium: 3.7 mmol/L (ref 3.5–5.1)
Sodium: 135 mmol/L (ref 135–145)

## 2019-10-30 LAB — GLUCOSE, PLEURAL OR PERITONEAL FLUID: Glucose, Fluid: 91 mg/dL

## 2019-10-30 LAB — ALBUMIN, PLEURAL OR PERITONEAL FLUID: Albumin, Fluid: 2.5 g/dL

## 2019-10-30 LAB — TSH: TSH: 8.32 u[IU]/mL — ABNORMAL HIGH (ref 0.350–4.500)

## 2019-10-30 MED ORDER — LIDOCAINE HCL 1 % IJ SOLN
INTRAMUSCULAR | Status: AC
  Filled 2019-10-30: qty 20

## 2019-10-30 MED ORDER — IOHEXOL 300 MG/ML  SOLN
75.0000 mL | Freq: Once | INTRAMUSCULAR | Status: AC | PRN
  Administered 2019-10-30: 75 mL via INTRAVENOUS

## 2019-10-30 MED ORDER — HYDROCHLOROTHIAZIDE 12.5 MG PO CAPS
12.5000 mg | ORAL_CAPSULE | Freq: Every day | ORAL | Status: DC
  Administered 2019-10-30 – 2019-10-31 (×2): 12.5 mg via ORAL
  Filled 2019-10-30 (×2): qty 1

## 2019-10-30 NOTE — Progress Notes (Addendum)
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  Collapsed lung CXR repeat in ED showed large left-sided pleural effusion left to right shift although patient was hemodynamically stable Echocardiogram was obtained secondary to possible CHF   Assessment & Plan:   Principal Problem:   Pleural effusion Active Problems:   Essential hypertension   History of prostate cancer   1. Pleural effusion etiology unclear a. BNP 20 effectively rules out heart failure, troponin within normal limits b. Echocardiogram performed 9/14 does show underlying grade 1 DD right ventricle not well visualized cannot assess PA pressures c.  Exudative by light's criteria (no LDH or protein done this morning) thoracentesis with lymphocytes of 70--- CT shows loculated effusion with pleural thickening-Dr. Erin Fulling pulmonary critical care consulted who will see in the morning for consideration of pigtail catheter versus other modalities d. .The patient is on room air at this time I feel we also need to repeat chest x-ray in the morning to ensure he does not reaccumulate quickly e. Await cytology from pleural tap-do not start antibiotics unless something grows on effusion 2. Prior prostate cancer status post prostatectomy a. Will need discussion about outpatient PSA with PCP 3. Prior knee surgery x2 a. Stable at this  time 4. HTN a. New diagnosis although review of chart shows prehypertension dating back to about 2019 b. He was only started on amlodipine on 8/24 and then developed DOE and shortness of breath prompting hospitalization c. Blood pressure is not controlled on lisinopril 20 they are in the 150 range therefore today I am adding HCTZ 12.5 and he may need augmentation 5. HLD a. Crestor 10 daily  DVT prophylaxis: Lovenox Code Status: Full Family Communication: Discussed with patient and his good friend who is a Marine scientist at the bedside Disposition:   Status is: Observation  The patient will require care spanning > 2 midnights and should be moved to inpatient because: Ongoing diagnostic testing needed not appropriate for outpatient work up, Unsafe d/c plan and IV treatments appropriate due to intensity of illness or inability to take PO  Dispo: The patient is from: Home              Anticipated d/c is to: Home              Anticipated d/c date is: 2 days              Patient currently is not medically stable to d/c.       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 Has never had anything like this happen before Smoked only for 6  years as a younger man Not sure about occupational exposures  Objective: Vitals:   10/30/19 0600 10/30/19 0630 10/30/19 0800 10/30/19 0830  BP: 137/82 (!) 114/93 (!) 155/85 (!) 155/85  Pulse: 80 70 73 88  Resp: 19 19 16 17   Temp:    97.7 F (36.5 C)  TempSrc:    Oral  SpO2: 93% 92% 93% 94%  Weight:      Height:       No intake or output data in the 24 hours ending 10/30/19 0834 Filed Weights   10/29/19 1727  Weight: 88.9  kg    Examination:  General exam: EOMI NCAT ruddy complexion, no JVD no bruit Respiratory system: Decreased air entry left lower lung fields with increased fremitus resonance  Cardiovascular system: S1-S2 cannot appreciate murmur-on monitors seems to be in sinus tach less than 120 Gastrointestinal system: Obese nontender no rebound cannot appreciate liver or splenomegaly. Central nervous system: Neurologically intact very strong looks younger than stated age power 5/5 upper and lower extremities sensory grossly intact Extremities: Bilateral knee scars to both knees no joint swellings or effusions Skin: No lower extremity edema Psychiatry: Pleasant euthymic no focal deficit  Data Reviewed: I have personally reviewed following labs and imaging studies BUNs/creatinine 13/0.6-->14/0.7 WBC 5.6 hemoglobin 14 platelet 220  Radiology Studies: DG Chest 2 View  Result Date: 10/29/2019 CLINICAL DATA:  Dyspnea on exertion. EXAM: CHEST - 2 VIEW COMPARISON:  January 06, 2010. FINDINGS: Interval development of large left pleural effusion resulting in left to right midline shift. Aerated left upper lobe is noted. No pneumothorax is noted. Right lung is clear. Bony thorax is unremarkable. IMPRESSION: Interval development of large left pleural effusion resulting in left to right midline shift. Electronically Signed   By: Marijo Conception M.D.   On: 10/29/2019 15:38     Scheduled Meds: . [START ON 11/01/2019] aspirin EC  81 mg Oral Once per day on Sun Thu  . lisinopril  20 mg Oral Daily  . rosuvastatin  10 mg Oral Daily   Continuous Infusions:   LOS: 0 days    Time spent: [place], MD [facility] To contact the attending provider between 7A-7P or the covering provider during after hours 7P-7A, please log into the web site [email or web address] and access using universal [place] password for that web site. If you do not have the password, please call the hospital  operator.  10/30/2019, 8:34 AM

## 2019-10-30 NOTE — Procedures (Signed)
Ultrasound-guided diagnostic and therapeutic left thoracentesis performed yielding 2.5 liters of hazy, amber fluid. No immediate complications. Follow-up chest x-ray pending. A portion of the fluid was sent to the lab for preordered studies. EBL< 2 cc.

## 2019-10-30 NOTE — ED Notes (Signed)
Pt in Echo procedure will transport when finished.

## 2019-10-30 NOTE — Progress Notes (Signed)
  Echocardiogram 2D Echocardiogram has been performed.  Jack Haynes 10/30/2019, 8:28 AM

## 2019-10-31 ENCOUNTER — Observation Stay (HOSPITAL_COMMUNITY): Payer: Medicare PPO

## 2019-10-31 DIAGNOSIS — J9811 Atelectasis: Secondary | ICD-10-CM | POA: Diagnosis not present

## 2019-10-31 DIAGNOSIS — J90 Pleural effusion, not elsewhere classified: Secondary | ICD-10-CM | POA: Diagnosis not present

## 2019-10-31 DIAGNOSIS — Z8546 Personal history of malignant neoplasm of prostate: Secondary | ICD-10-CM | POA: Diagnosis not present

## 2019-10-31 DIAGNOSIS — I10 Essential (primary) hypertension: Secondary | ICD-10-CM | POA: Diagnosis not present

## 2019-10-31 LAB — COMPREHENSIVE METABOLIC PANEL
ALT: 14 U/L (ref 0–44)
AST: 13 U/L — ABNORMAL LOW (ref 15–41)
Albumin: 3.5 g/dL (ref 3.5–5.0)
Alkaline Phosphatase: 62 U/L (ref 38–126)
Anion gap: 10 (ref 5–15)
BUN: 16 mg/dL (ref 8–23)
CO2: 24 mmol/L (ref 22–32)
Calcium: 8.7 mg/dL — ABNORMAL LOW (ref 8.9–10.3)
Chloride: 101 mmol/L (ref 98–111)
Creatinine, Ser: 0.74 mg/dL (ref 0.61–1.24)
GFR calc Af Amer: >60 mL/min (ref >60)
GFR calc non Af Amer: >60 mL/min (ref >60)
Glucose, Bld: 122 mg/dL — ABNORMAL HIGH (ref 70–99)
Potassium: 3.7 mmol/L (ref 3.5–5.1)
Sodium: 135 mmol/L (ref 135–145)
Total Bilirubin: 0.5 mg/dL (ref 0.3–1.2)
Total Protein: 6.4 g/dL — ABNORMAL LOW (ref 6.5–8.1)

## 2019-10-31 LAB — CBC WITH DIFFERENTIAL/PLATELET
Abs Immature Granulocytes: 0.05 10*3/uL (ref 0.00–0.07)
Basophils Absolute: 0 10*3/uL (ref 0.0–0.1)
Basophils Relative: 1 %
Eosinophils Absolute: 0.1 10*3/uL (ref 0.0–0.5)
Eosinophils Relative: 2 %
HCT: 45.1 % (ref 39.0–52.0)
Hemoglobin: 15.3 g/dL (ref 13.0–17.0)
Immature Granulocytes: 1 %
Lymphocytes Relative: 26 %
Lymphs Abs: 1.7 10*3/uL (ref 0.7–4.0)
MCH: 30.7 pg (ref 26.0–34.0)
MCHC: 33.9 g/dL (ref 30.0–36.0)
MCV: 90.6 fL (ref 80.0–100.0)
Monocytes Absolute: 0.8 10*3/uL (ref 0.1–1.0)
Monocytes Relative: 12 %
Neutro Abs: 3.7 10*3/uL (ref 1.7–7.7)
Neutrophils Relative %: 58 %
Platelets: 211 10*3/uL (ref 150–400)
RBC: 4.98 MIL/uL (ref 4.22–5.81)
RDW: 11.7 % (ref 11.5–15.5)
WBC: 6.4 10*3/uL (ref 4.0–10.5)
nRBC: 0 % (ref 0.0–0.2)

## 2019-10-31 LAB — TRIGLYCERIDES, BODY FLUIDS: Triglycerides, Fluid: 32 mg/dL

## 2019-10-31 LAB — PH, BODY FLUID: pH, Body Fluid: 7.5

## 2019-10-31 NOTE — Care Management Obs Status (Signed)
[facility] NOTIFICATION   Patient Details  Name: Jack Haynes MRN: [id number] Date of Birth: 1940-04-30   Medicare Observation Status Notification Given:  Yes    Cecil Cobbs 10/31/2019, 5:31 PM

## 2019-10-31 NOTE — Discharge Summary (Signed)
Physician Discharge Summary  Jack Haynes HAL:[id number] DOB: Nov 09, 1940 DOA: 10/29/2019  PCP: [name], MD  Admit date: 10/29/2019 Discharge date: 10/31/2019  Admitted From: Home Disposition: Home   Recommendations for Outpatient Follow-up:  1. Follow up with PCP in 1-2 weeks for ongoing BP management. 2. Follow up with pulmonary 9/22 for further work up of lymphocytic-predominant exudative pleural effusion. Cytology pending at time of discharge. If negative, would pursue repeat diagnostic thoracentesis. If positive, will need PET imaging, oncology referral.  Home Health: None Equipment/Devices: None Discharge Condition: Stable CODE STATUS: Full Diet recommendation: Heart healthy  Brief/Interim Summary: 79 y/o M admitted 9/13 from his PCP office with reports of abnormal chest xray with large left sided pleural effusion.   The patient was seen approximately one month prior to admit for a well visit with his PCP.  At that time he was found to be hypertensive and was treated.  The following week after initiating antihypertensives, he noticed increased fatigue and dyspnea.  He normally is active and power walks daily without issue.  In addition, he developed difficulty lying flat and had to sleep sitting up. He followed up with his PCP on 9/13 and had a CXR which showed large left sided pleural effusion with right mediastinal shift. He underwent left thora with IR yielding 2.5 L of hazy amber fluid with lymphocytic predominance and exudative properties. CT was performed and showed pleural thickening/irregularity, though no lymphadenopathy (could have been target for bronchoscopy). Overall, there is concern for malignancy, though cytology is pending at discharge. Follow up has been scheduled by pulmonary for the week after discharge when cytology results will have returned and next steps in management can be discussed. He is eager to go home, feels his breathing is significantly  improved, and is discharged 9/15 in stable condition.  Discharge Diagnoses:  Principal Problem:   Pleural effusion Active Problems:   Essential hypertension   History of prostate cancer  Obesity: Body mass index is 30.7 kg/m.   HLD: Continue statin  Discharge Instructions Discharge Instructions    Discharge instructions   Complete by: As directed    You were evaluated for pleural effusion, fluid around the lung and have improved significantly since drainage. Pulmonary medicine has cleared you for discharge with plans to follow up on 9/22 at noon. If your shortness of breath returns before then, seek medical attention.   Follow up with your PCP for blood pressure management. Per their recommendations, take lisinopril and stop amlodipine.     Allergies as of 10/31/2019   No Known Allergies     Medication List    STOP taking these medications   amLODipine 5 MG tablet Commonly known as: NORVASC     TAKE these medications   aspirin EC 81 MG tablet Take 81 mg by mouth See admin instructions. Take one tablet (81 mg) by mouth twice a week at night   carboxymethylcellulose 0.5 % Soln Commonly known as: REFRESH PLUS Place 1 drop into the left eye daily as needed (dry eyes/irritation).   lisinopril 20 MG tablet Commonly known as: ZESTRIL Take 1 tablet (20 mg total) by mouth daily.   naproxen sodium 220 MG tablet Commonly known as: ALEVE Take 220 mg by mouth daily as needed (pain/headache).   NEURIVA PO Take 1 capsule by mouth daily.   rosuvastatin 10 MG tablet Commonly known as: Crestor Take 1 tablet (10 mg total) by mouth daily.       Follow-up Information  Freddi Starr, MD Follow up on 11/07/2019.   Specialty: Pulmonary Disease Why: Appt at 12:00 noon, please arrive 11:45 for check in. Please note- above address is incorrect.  Office address is: [address] [address] Contact information: [place]  [phone]        [name], MD Follow up.   Specialty: Internal Medicine Contact information: [place] [address] [phone]              No Known Allergies  Consultations:  PCCM  Procedures/Studies: DG Chest 1 View  Result Date: 10/30/2019 CLINICAL DATA:  Status post left thoracentesis. EXAM: CHEST  1 VIEW COMPARISON:  October 29, 2019. FINDINGS: Stable cardiomediastinal silhouette. No pneumothorax is noted. Minimal right basilar subsegmental atelectasis is noted. No pneumothorax is noted. Left pleural effusion is significantly smaller status post thoracentesis. Bony thorax is unremarkable. IMPRESSION: Left pleural effusion is significantly smaller status post thoracentesis. No pneumothorax is noted. Electronically Signed   By: Marijo Conception M.D.   On: 10/30/2019 12:31   DG Chest 2 View  Result Date: 10/31/2019 CLINICAL DATA:  Follow-up pleural effusion. EXAM: CHEST - 2 VIEW COMPARISON:  10/30/2019 FINDINGS: The cardiac silhouette, mediastinal and hilar contours are within normal limits and stable. Persistent but slightly smaller left pleural effusion with overlying atelectasis. IMPRESSION: Persistent but smaller left pleural effusion with overlying atelectasis. Electronically Signed   By: Marijo Sanes M.D.   On: 10/31/2019 08:26   DG Chest 2 View  Result Date: 10/29/2019 CLINICAL DATA:  Dyspnea on exertion. EXAM: CHEST - 2 VIEW COMPARISON:  January 06, 2010. FINDINGS: Interval development of large left pleural effusion resulting in left to right midline shift. Aerated left upper lobe is noted. No pneumothorax is noted. Right lung is clear. Bony thorax is unremarkable. IMPRESSION: Interval development of large left pleural effusion resulting in left to right midline shift. Electronically Signed   By: Marijo Conception M.D.   On: 10/29/2019 15:38   CT CHEST W CONTRAST  Result Date: 10/30/2019 CLINICAL DATA:  Abnormal chest x-ray,  pleural effusion EXAM: CT CHEST WITH CONTRAST TECHNIQUE: Multidetector CT imaging of the chest was performed during intravenous contrast administration. CONTRAST:  94mL OMNIPAQUE IOHEXOL 300 MG/ML  SOLN COMPARISON:  Same-day radiograph, CT 05/15/2014 FINDINGS: Cardiovascular: The aortic root is suboptimally assessed given cardiac pulsation artifact. Atherosclerotic plaque within the normal caliber aorta. No acute luminal abnormality of the imaged aorta. No periaortic stranding or hemorrhage. Normal 3 vessel branching of the aortic arch. Proximal great vessels are mildly calcified but otherwise unremarkable. Cardiac size within normal limits. Trace pericardial fluid likely at the upper limits of physiologic normal. Small amount of fluid in the pericardial recesses as well. Three-vessel coronary artery disease is noted. Central pulmonary arteries normal caliber. No large central or lobar filling defects on this non tailored examination of the pulmonary arteries. Mediastinum/Nodes: Small amount of fluid in the pericardial recesses, as above. No mediastinal fluid or gas. Normal thyroid gland and thoracic inlet. No acute abnormality of the trachea or esophagus. No worrisome mediastinal, hilar or axillary adenopathy. Lungs/Pleura: There are regions of predominantly peribronchovascular ground-glass opacity throughout the left lung with partial atelectatic collapse of the left lower lobe. A moderate left pleural effusion is present with some fairly circumferential pleural thickening including some pleural nodularity along the left lung periphery and a larger pleural based nodule seen with aggressive when doing in the medial left lung  base (2/107). Lobular appearance of the effusion itself. Additional atelectatic changes are present the right lung base. Some mild diffuse airways thickening and scattered secretions are noted. Upper Abdomen: No acute abnormalities present in the visualized portions of the upper abdomen.  Musculoskeletal: Multilevel degenerative changes are present in the imaged portions of the spine. No acute osseous abnormality or suspicious osseous lesion. No worrisome chest wall lesions. IMPRESSION: 1. Moderate left pleural effusion with features of loculation, circumferential pleural thickening, and multifocal pleural nodularity. Findings raise suspicion for a malignant or metastatic pleural process in the left lung with associated pleural effusion. Could consider PET-CT. 2. Patchy regions of predominantly peribronchovascular ground-glass opacity throughout the left lung is more likely to reflect a superimposed infectious/inflammatory process likely in the setting of a poorly ventilating left lung. 3. Three-vessel coronary artery disease. 4. Aortic Atherosclerosis (ICD10-I70.0). Electronically Signed   By: Lovena Le M.D.   On: 10/30/2019 17:07   ECHOCARDIOGRAM COMPLETE  Result Date: 10/30/2019    ECHOCARDIOGRAM REPORT   Patient Name:   BRENDAN GADSON Date of Exam: 10/30/2019 Medical Rec #:  [id number]       Height:       67.0 in Accession #:    [id number]      Weight:       196.0 lb Date of Birth:  07-04-1940       BSA:          2.005 m Patient Age:    38 years        BP:           114/93 mmHg Patient Gender: M               HR:           79 bpm. Exam Location:  Inpatient Procedure: 2D Echo, Cardiac Doppler and Color Doppler Indications:    R06.02 SOB  History:        Patient has prior history of Echocardiogram examinations, most                 recent 06/07/2006. Abnormal ECG; Risk Factors:Hypertension.                 History of cancer. Pleural effusion.  Sonographer:    Roseanna Rainbow RDCS Referring Phys: [id number] [place] T TU  Sonographer Comments: Technically difficult study due to poor echo windows. IMPRESSIONS  1. Left ventricular ejection fraction, by estimation, is 60 to 65%. The left ventricle has normal function. The left ventricle has no regional wall motion abnormalities. There is mild concentric left  ventricular hypertrophy. Left ventricular diastolic parameters are consistent with Grade I diastolic dysfunction (impaired relaxation).  2. Right ventricular systolic function was not well visualized. The right ventricular size is not well visualized. Tricuspid regurgitation signal is inadequate for assessing PA pressure.  3. Large pleural effusion in the left lateral region.  4. The mitral valve is normal in structure. No evidence of mitral valve regurgitation. No evidence of mitral stenosis.  5. The aortic valve was not well visualized. Aortic valve regurgitation is not visualized.  6. The inferior vena cava is normal in size with greater than 50% respiratory variability, suggesting right atrial pressure of 3 mmHg. FINDINGS  Left Ventricle: Left ventricular ejection fraction, by estimation, is 60 to 65%. The left ventricle has normal function. The left ventricle has no regional wall motion abnormalities. The left ventricular internal cavity size was normal in size. There is  mild concentric left ventricular hypertrophy.  Left ventricular diastolic parameters are consistent with Grade I diastolic dysfunction (impaired relaxation). Normal left ventricular filling pressure. Right Ventricle: The right ventricular size is not well visualized. Right vetricular wall thickness was not assessed. Right ventricular systolic function was not well visualized. Tricuspid regurgitation signal is inadequate for assessing PA pressure. Left Atrium: Left atrial size was normal in size. Right Atrium: Right atrial size was normal in size. Pericardium: There is no evidence of pericardial effusion. Mitral Valve: The mitral valve is normal in structure. No evidence of mitral valve regurgitation. No evidence of mitral valve stenosis. Tricuspid Valve: The tricuspid valve is normal in structure. Tricuspid valve regurgitation is not demonstrated. No evidence of tricuspid stenosis. Aortic Valve: The aortic valve was not well visualized. Aortic  valve regurgitation is not visualized. Pulmonic Valve: The pulmonic valve was normal in structure. Pulmonic valve regurgitation is not visualized. No evidence of pulmonic stenosis. Aorta: The aortic root is normal in size and structure. Venous: The inferior vena cava is normal in size with greater than 50% respiratory variability, suggesting right atrial pressure of 3 mmHg. IAS/Shunts: No atrial level shunt detected by color flow Doppler. Additional Comments: There is a large pleural effusion in the left lateral region.  LEFT VENTRICLE PLAX 2D LVIDd:         4.21 cm     Diastology LVIDs:         3.01 cm     LV e' medial:    5.77 cm/s LV PW:         1.20 cm     LV E/e' medial:  12.0 LV IVS:        1.16 cm     LV e' lateral:   9.46 cm/s LVOT diam:     2.00 cm     LV E/e' lateral: 7.3 LV SV:         49 LV SV Index:   25 LVOT Area:     3.14 cm  LV Volumes (MOD) LV vol d, MOD A2C: 50.5 ml LV vol d, MOD A4C: 53.6 ml LV vol s, MOD A2C: 20.9 ml LV vol s, MOD A4C: 17.4 ml LV SV MOD A2C:     29.6 ml LV SV MOD A4C:     53.6 ml LV SV MOD BP:      32.3 ml RIGHT VENTRICLE            IVC RV S prime:     9.36 cm/s  IVC diam: 1.82 cm TAPSE (M-mode): 1.4 cm LEFT ATRIUM             Index       RIGHT ATRIUM           Index LA diam:        3.60 cm 1.80 cm/m  RA Area:     10.40 cm LA Vol (A2C):   30.1 ml 15.01 ml/m RA Volume:   23.60 ml  11.77 ml/m LA Vol (A4C):   19.2 ml 9.58 ml/m LA Biplane Vol: 24.4 ml 12.17 ml/m  AORTIC VALVE LVOT Vmax:   95.30 cm/s LVOT Vmean:  60.600 cm/s LVOT VTI:    0.157 m  AORTA Ao Root diam: 3.60 cm Ao Asc diam:  3.60 cm MITRAL VALVE MV Area (PHT): 2.39 cm    SHUNTS MV Decel Time: 317 msec    Systemic VTI:  0.16 m MV E velocity: 69.40 cm/s  Systemic Diam: 2.00 cm MV A velocity: 75.00 cm/s MV E/A ratio:  0.93 Fransico Him MD Electronically signed by Fransico Him MD Signature Date/Time: 10/30/2019/9:02:29 AM    Final    US THORACENTESIS ASP PLEURAL SPACE W/IMG GUIDE  Result Date:  10/30/2019 INDICATION: Patient with remote history of tobacco use, prostate cancer, hypertension, coronary artery disease; now with dyspnea, large left pleural effusion. Request received for diagnostic and therapeutic left thoracentesis. EXAM: ULTRASOUND GUIDED DIAGNOSTIC AND THERAPEUTIC LEFT THORACENTESIS MEDICATIONS: 1% lidocaine to skin and subcutaneous tissue COMPLICATIONS: None immediate. PROCEDURE: An ultrasound guided thoracentesis was thoroughly discussed with the patient and questions answered. The benefits, risks, alternatives and complications were also discussed. The patient understands and wishes to proceed with the procedure. Written consent was obtained. Ultrasound was performed to localize and mark an adequate pocket of fluid in the left chest. The area was then prepped and draped in the normal sterile fashion. 1% Lidocaine was used for local anesthesia. Under ultrasound guidance a 6 Fr Safe-T-Centesis catheter was introduced. Thoracentesis was performed. The catheter was removed and a dressing applied. FINDINGS: A total of approximately 2.5 liters of hazy,amber fluid was removed. Samples were sent to the laboratory as requested by the clinical team. IMPRESSION: Successful ultrasound guided diagnostic and therapeutic left thoracentesis yielding 2.5 liters of pleural fluid. Read by: Rowe Robert, PA-C Electronically Signed   By: Jacqulynn Cadet M.D.   On: 10/30/2019 12:16   Subjective: Feels well, walking nervously around room, eager to leave hospital. No shortness of breath or pain.   Discharge Exam: Vitals:   10/31/19 0908 10/31/19 1316  BP: 125/69 117/75  Pulse: 78 81  Resp: 18 18  Temp: 98.5 F (36.9 C) 97.7 F (36.5 C)  SpO2: 93% 96%   General: Pt is alert, awake, not in acute distress Cardiovascular: RRR, S1/S2 +, no rubs, no gallops Respiratory: CTA bilaterally, no wheezing, no rhonchi Abdominal: Soft, NT, ND, bowel sounds + Extremities: No edema, no cyanosis  Labs: BNP  (last 3 results) Recent Labs    10/29/19 1744  BNP 29.9   Basic Metabolic Panel: Recent Labs  Lab 10/29/19 1744 10/30/19 0329 10/31/19 0529  NA 137 135 135  K 3.5 3.7 3.7  CL 101 101 101  CO2 24 26 24   GLUCOSE 115* 109* 122*  BUN 13 14 16   CREATININE 0.68 0.79 0.74  CALCIUM 8.8* 8.7* 8.7*   Liver Function Tests: Recent Labs  Lab 10/29/19 1744 10/31/19 0529  AST 17 13*  ALT 18 14  ALKPHOS 65 62  BILITOT 0.7 0.5  PROT 7.3 6.4*  ALBUMIN 4.0 3.5   No results for input(s): LIPASE, AMYLASE in the last 168 hours. No results for input(s): AMMONIA in the last 168 hours. CBC: Recent Labs  Lab 10/29/19 1744 10/30/19 0329 10/31/19 0529  WBC 6.8 5.6 6.4  NEUTROABS 4.2  --  3.7  HGB 15.7 14.8 15.3  HCT 45.1 42.4 45.1  MCV 89.3 90.4 90.6  PLT 240 220 211   Cardiac Enzymes: No results for input(s): CKTOTAL, CKMB, CKMBINDEX, TROPONINI in the last 168 hours. BNP: Invalid input(s): POCBNP CBG: No results for input(s): GLUCAP in the last 168 hours. D-Dimer No results for input(s): DDIMER in the last 72 hours. Hgb A1c No results for input(s): HGBA1C in the last 72 hours. Lipid Profile No results for input(s): CHOL, HDL, LDLCALC, TRIG, CHOLHDL, LDLDIRECT in the last 72 hours. Thyroid function studies No results for input(s): TSH, T4TOTAL, T3FREE, THYROIDAB in the last 72 hours.  Invalid input(s): FREET3 Anemia work up No results for input(s):  VITAMINB12, FOLATE, FERRITIN, TIBC, IRON, RETICCTPCT in the last 72 hours. Urinalysis    Component Value Date/Time   COLORURINE YELLOW 01/27/2012 0818   APPEARANCEUR CLEAR 01/27/2012 0818   LABSPEC 1.018 01/27/2012 0818   PHURINE 6.0 01/27/2012 0818   GLUCOSEU NEGATIVE 01/27/2012 0818   HGBUR NEGATIVE 01/27/2012 0818   BILIRUBINUR negative 12/24/2014 0848   BILIRUBINUR neg 08/21/2012 1330   KETONESUR negative 12/24/2014 0848   KETONESUR NEGATIVE 01/27/2012 0818   PROTEINUR negative 12/24/2014 0848   PROTEINUR neg  08/21/2012 1330   PROTEINUR NEGATIVE 01/27/2012 0818   UROBILINOGEN 0.2 12/24/2014 0848   UROBILINOGEN 0.2 01/27/2012 0818   NITRITE Negative 12/24/2014 0848   NITRITE neg 08/21/2012 1330   NITRITE NEGATIVE 01/27/2012 0818   LEUKOCYTESUR Negative 12/24/2014 0848    Microbiology Recent Results (from the past 240 hour(s))  SARS Coronavirus 2 by RT PCR (hospital order, performed in [place] hospital lab) Nasopharyngeal Nasopharyngeal Swab     Status: None   Collection Time: 10/29/19  5:49 PM   Specimen: Nasopharyngeal Swab  Result Value Ref Range Status   SARS Coronavirus 2 NEGATIVE NEGATIVE Final    Comment: (NOTE) SARS-CoV-2 target nucleic acids are NOT DETECTED.  The SARS-CoV-2 RNA is generally detectable in upper and lower respiratory specimens during the acute phase of infection. The lowest concentration of SARS-CoV-2 viral copies this assay can detect is 250 copies / mL. A negative result does not preclude SARS-CoV-2 infection and should not be used as the sole basis for treatment or other patient management decisions.  A negative result may occur with improper specimen collection / handling, submission of specimen other than nasopharyngeal swab, presence of viral mutation(s) within the areas targeted by this assay, and inadequate number of viral copies (<250 copies / mL). A negative result must be combined with clinical observations, patient history, and epidemiological information.  Fact Sheet for Patients:   StrictlyIdeas.no  Fact Sheet for Healthcare Providers: BankingDealers.co.za  This test is not yet approved or  cleared by the [place] FDA and has been authorized for detection and/or diagnosis of SARS-CoV-2 by FDA under an Emergency Use Authorization (EUA).  This EUA will remain in effect (meaning this test can be used) for the duration of the COVID-19 declaration under Section 564(b)(1) of the Act, 21  U.S.C. section 360bbb-3(b)(1), unless the authorization is terminated or revoked sooner.  Performed at [facility], [place] [address], [place], [place] [address]   Body fluid culture     Status: None   Collection Time: 10/30/19 11:52 AM   Specimen: PATH Cytology Pleural fluid  Result Value Ref Range Status   Specimen Description   Final    PLEURAL Performed at [facility] [address], [place], [place] [address]    Special Requests   Final    NONE Performed at [facility], [place] [address], [place], [place] [address]    Gram Stain   Final    WBC PRESENT,BOTH PMN AND MONONUCLEAR NO ORGANISMS SEEN CYTOSPIN SMEAR    Culture   Final    NO GROWTH 3 DAYS Performed at [facility], [place] [address], [place], [place] [address]    Report Status 11/02/2019 FINAL  Final  Fungus Culture With Stain     Status: None (Preliminary result)   Collection Time: 10/30/19 11:52 AM   Specimen: PATH Cytology Pleural fluid  Result Value Ref Range Status   Fungus Stain Final report  Final  Comment: (NOTE) Performed At: [facility] [place], [place] [id number] Rush Farmer MD JW:[id number]    Fungus (Mycology) Culture PENDING  Incomplete   Fungal Source PLEURAL  Final    Comment: Performed at [facility], [place] [address] [place], [place] [address]  Fungus Culture Result     Status: None   Collection Time: 10/30/19 11:52 AM  Result Value Ref Range Status   Result 1 Comment  Final    Comment: (NOTE) KOH/Calcofluor preparation:  no fungus observed. Performed At: [facility] [place], [place] [id number] Rush Farmer MD RF:[id number]     Time coordinating discharge: Approximately 40 minutes  Patrecia Pour, MD  [facility] 11/02/2019, 5:14 PM

## 2019-10-31 NOTE — Progress Notes (Signed)
Patient remains stable at this time, A&Ox4, ambulatory without assistance. Discharge instructions reviewed. Question, concerns denied.

## 2019-10-31 NOTE — Consult Note (Signed)
NAME:  Jack Haynes, MRN:  [id number], DOB:  04/08/1940, LOS: 0 ADMISSION DATE:  10/29/2019, CONSULTATION DATE:  10/31/19 REFERRING MD:  Dr. Bonner Puna, CHIEF COMPLAINT:  Abnormal CXR   Brief History   79 y/o M admitted with large left pleural effusion s/p L thora on 9/14 with 2.5L removed.    History of present illness   79 y/o M admitted 9/13 from his PCP office with reports of abnormal chest xray with large left sided pleural effusion.   The patient was seen approximately one month prior to admit for a well visit with his PCP.  At that time he was found to be hypertensive and was treated.  The following week after initiating antihypertensives, he noticed increased fatigue and dyspnea.  He normally is active and power walks daily without issue.  In addition, he developed difficulty lying flat and had to sleep sitting up. He followed up with his PCP on 9/13 and had a chest XRAY which showed large left sided pleural effusion with right mediastinal shift.  He underwent left thora with IR yielding 2.5 L of hazy amber fluid.  Cytology pending.   PCCM consulted for evaluation.   He denies weight loss, night sweats, n/v/d.  He reports recent shortness of breath in the last two weeks with exertion and difficulty swallowing.  He states his shortness of breath and swallowing issues have resolved since the thoracentesis.   Past Medical History  HTN  CAD  Prostate Cancer s/p Prostatectomy  Significant Hospital Events   9/13 Admit with abnormal CXR 9/14 Thora with IR, 2.5 L hazy amber fluid (protein ratio 0.6, LDH 2.1)   Consults:    Procedures:    Significant Diagnostic Tests:  9/14 Thora >> 2.5L fluid removed, exudate by LDH & protein  9/14 CT Chest >> moderate left effusion with features of loculation, circumferential pleural thickening, and multifocal pleural nodularity, patchy regions of peri-bronchovascular ground glass opacity, no lymphadenopathy, three vessel CAD  Micro Data:  COVID  9/13 >> negative  Pleural GS 9/14 >>  Pleural Fungal 9/14 >>  Pleural Culture 9/14 >>  Pleural Cytology 9/14 >>   Antimicrobials:    Interim history/subjective:  Pt reports feeling well, anxious to go home if able.   Objective   Blood pressure 125/69, pulse 78, temperature 98.5 F (36.9 C), temperature source Oral, resp. rate 18, height 5\' 7"  (1.702 m), weight 88.9 kg, SpO2 93 %.        Intake/Output Summary (Last 24 hours) at 10/31/2019 1054 Last data filed at 10/31/2019 0600 Gross per 24 hour  Intake 720 ml  Output --  Net 720 ml   Filed Weights   10/29/19 1727  Weight: 88.9 kg    Examination: General: adult male, non-toxic appearing, sitting up in chair.  Friend at bedside   HEENT: MM pink/moist, no jvd, mask in place, anicteric, no palpable supraclavicular LAN  Neuro: AAOx4, speech clear, MAE  CV: s1s2 rrr, no m/r/g PULM: non-labored on RA, clear on right, diminished posterior lower  GI: soft, bsx4 active  Extremities: warm/dry, no edema  Skin: no rashes or lesions  CXR 9/15 >> images personally reviewed, significant reduction in left pleural fluid, residual small effusion   Resolved Hospital Problem list     Assessment & Plan:   Exudative Left Pleural Effusion Pleural Thickening  Tobacco Abuse  Incidental finding of large left effusion in the setting of 2 week hx of SOB & fatigue.  S/p thora with 2.5L removed.  Hx of tobacco abuse.  Noted CT with pleural thickening / irregularity.  Concern for possible malignancy. No significant LAN on CT.   -await cytology, if positive will likely need pleurX for drainage (would need to confirm community support / home health RN availability for outpatient management as has been limited in the recent months)  -follow CXR intermittently  -will need outpatient PET imaging  -no significant LAN on imaging, would limit utility of bronchoscopy  -smoking cessation counseling  -pending rate of fluid recurrence, he may be able to go  home and follow up in clinic -follow up pleural cytology, if negative would pursue second thora or pleural fluid sampling   Best practice:  Diet: per primary  Pain/Anxiety/Delirium protocol (if indicated): n/a VAP protocol (if indicated): n/a DVT prophylaxis: SCD's  GI prophylaxis: n/a Glucose control: per primary  Mobility: as tolerated  Code Status: Full Code  Family Communication: Patient updated on plan of care Disposition: per primary   Labs   CBC: Recent Labs  Lab 10/29/19 1744 10/30/19 0329 10/31/19 0529  WBC 6.8 5.6 6.4  NEUTROABS 4.2  --  3.7  HGB 15.7 14.8 15.3  HCT 45.1 42.4 45.1  MCV 89.3 90.4 90.6  PLT 240 220 924    Basic Metabolic Panel: Recent Labs  Lab 10/29/19 1744 10/30/19 0329 10/31/19 0529  NA 137 135 135  K 3.5 3.7 3.7  CL 101 101 101  CO2 24 26 24   GLUCOSE 115* 109* 122*  BUN 13 14 16   CREATININE 0.68 0.79 0.74  CALCIUM 8.8* 8.7* 8.7*   GFR: Estimated Creatinine Clearance: 80.9 mL/min (by C-G formula based on SCr of 0.74 mg/dL). Recent Labs  Lab 10/29/19 1744 10/30/19 0329 10/31/19 0529  WBC 6.8 5.6 6.4    Liver Function Tests: Recent Labs  Lab 10/29/19 1744 10/31/19 0529  AST 17 13*  ALT 18 14  ALKPHOS 65 62  BILITOT 0.7 0.5  PROT 7.3 6.4*  ALBUMIN 4.0 3.5   No results for input(s): LIPASE, AMYLASE in the last 168 hours. No results for input(s): AMMONIA in the last 168 hours.  ABG No results found for: PHART, PCO2ART, PO2ART, HCO3, TCO2, ACIDBASEDEF, O2SAT   Coagulation Profile: No results for input(s): INR, PROTIME in the last 168 hours.  Cardiac Enzymes: No results for input(s): CKTOTAL, CKMB, CKMBINDEX, TROPONINI in the last 168 hours.  HbA1C: Hgb A1c MFr Bld  Date/Time Value Ref Range Status  09/27/2019 01:45 PM 6.6 (H) 4.8 - 5.6 % Final    Comment:             Prediabetes: 5.7 - 6.4          Diabetes: >6.4          Glycemic control for adults with diabetes: <7.0     CBG: No results for input(s):  GLUCAP in the last 168 hours.  Review of Systems:   Gen: Denies fever, chills, weight change, fatigue, night sweats HEENT: Denies blurred vision, double vision, hearing loss, tinnitus, sinus congestion, rhinorrhea, sore throat, neck stiffness, dysphagia PULM: Denies shortness of breath, cough, sputum production, hemoptysis, wheezing CV: Denies chest pain, edema, orthopnea, paroxysmal nocturnal dyspnea, palpitations GI: Denies abdominal pain, nausea, vomiting, diarrhea, hematochezia, melena, constipation, change in bowel habits GU: Denies dysuria, hematuria, polyuria, oliguria, urethral discharge Endocrine: Denies hot or cold intolerance, polyuria, polyphagia or appetite change Derm: Denies rash, dry skin, scaling or peeling skin change Heme: Denies easy bruising, bleeding, bleeding gums Neuro: Denies headache, numbness, weakness, slurred  speech, loss of memory or consciousness  Past Medical History  He,  has a past medical history of Arthritis and Cancer ([place]).   Surgical History    Past Surgical History:  Procedure Laterality Date  . APPENDECTOMY    . BACK SURGERY  1998  . EYE SURGERY Left 1965  . JOINT REPLACEMENT Left 2010   left knee replacement  . KNEE SURGERY    . PROSTATE SURGERY    . SPINE SURGERY    . TOTAL KNEE ARTHROPLASTY  02/01/2012   Procedure: TOTAL KNEE ARTHROPLASTY;  Surgeon: Sydnee Cabal, MD;  Location: [facility];  Service: Orthopedics;  Laterality: Left;     Social History   reports that he quit smoking about 38 years ago. His smokeless tobacco use includes chew. He reports current alcohol use of about 5.0 standard drinks of alcohol per week. He reports that he does not use drugs.   Family History   His family history includes Alzheimer's disease in his brother and father; Cancer in his sister; Heart disease in his brother and sister; Hypertension in his sister. There is no history of Colon cancer, Colon polyps, Esophageal cancer, Rectal cancer, or Stomach  cancer.   Allergies No Known Allergies   Home Medications  Prior to Admission medications   Medication Sig Start Date End Date Taking? Authorizing Provider  aspirin EC 81 MG tablet Take 81 mg by mouth See admin instructions. Take one tablet (81 mg) by mouth twice a week at night   Yes [provider]  carboxymethylcellulose (REFRESH PLUS) 0.5 % SOLN Place 1 drop into the left eye daily as needed (dry eyes/irritation).    Yes [provider]  lisinopril (ZESTRIL) 20 MG tablet Take 1 tablet (20 mg total) by mouth daily. 10/29/19  Yes Sagardia, Ines Bloomer, MD  Misc Natural Products (NEURIVA PO) Take 1 capsule by mouth daily.    Yes [provider]  naproxen sodium (ALEVE) 220 MG tablet Take 220 mg by mouth daily as needed (pain/headache).   Yes [provider]  rosuvastatin (CRESTOR) 10 MG tablet Take 1 tablet (10 mg total) by mouth daily. 10/29/19  Yes Sagardia, Ines Bloomer, MD  amLODipine (NORVASC) 5 MG tablet Take 1 tablet (5 mg total) by mouth daily. Patient not taking: Reported on 10/29/2019 10/09/19   [name], MD     Critical care time: n/a    Noe Gens, MSN, NP-C [facility] 10/31/2019, 10:54 AM   Please see [email or web address] for pager details.

## 2019-11-02 LAB — BODY FLUID CULTURE: Culture: NO GROWTH

## 2019-11-02 LAB — CHOLESTEROL, BODY FLUID: Cholesterol, Fluid: 84 mg/dL

## 2019-11-02 LAB — CYTOLOGY - NON PAP

## 2019-11-07 ENCOUNTER — Ambulatory Visit (INDEPENDENT_AMBULATORY_CARE_PROVIDER_SITE_OTHER): Payer: Medicare PPO

## 2019-11-07 ENCOUNTER — Ambulatory Visit: Payer: Medicare PPO | Admitting: Pulmonary Disease

## 2019-11-07 ENCOUNTER — Other Ambulatory Visit: Payer: Self-pay

## 2019-11-07 ENCOUNTER — Encounter: Payer: Self-pay | Admitting: Pulmonary Disease

## 2019-11-07 VITALS — BP 128/72 | HR 71 | Temp 97.7°F | Ht 67.0 in | Wt 192.0 lb

## 2019-11-07 DIAGNOSIS — J90 Pleural effusion, not elsewhere classified: Secondary | ICD-10-CM

## 2019-11-07 DIAGNOSIS — J929 Pleural plaque without asbestos: Secondary | ICD-10-CM | POA: Diagnosis not present

## 2019-11-07 NOTE — Progress Notes (Signed)
Synopsis: Hospital follow up for pleural effusion  Subjective:   PATIENT ID: Jack Haynes GENDER: male DOB: 08-05-1940, MRN: [id number]   HPI  Chief Complaint  Patient presents with  . Follow-up    Denies any breathing issues    Jack Haynes is a 79 year old male, former smoker with history of prostate cancer who presented to [facility] on 10/29/19 with shortness of breath and left pleural effusion.   He was sent to the hospital by his primary care physician who obtained the chest radiograph which noted the left pleural effusion. He underwent thoracentesis by IR on 10/30/19 with 2.5L hazy amber fluid removed. Pleural fluid studies indicate a lymphocytic exudative effusion (Total protein: 4.1/6.4 and LDH 244/118). He reports significant improvement in his breathing after the thoracentesis and denies any shortness of breath at this time. He denies chest discomfort, wheezing or cough. No hemoptysis. He denies fevers, chills, sweats, weight loss or lack of appetite.   Pleural fluid cytology shows atypical mesothelial cells with concerns for mesothelioma per the pathologists comments. Immunohistochemical stains show that the atypical cells are positive for calretinin and D2-40; and negative for TTF-1, CK7, CK20 and PSA, consistent with mesothelial origin.   Patient has a 20 pack history and quit 40 years ago. He works as an Economist. He denies overt asbestos exposure but he has been exposed to many forms of agricultural dusts. The pumps he works on can be covered in Building surveyor.   Past Medical History:  Diagnosis Date  . Arthritis   . Cancer ([place])    hx of prostatae cancer     Family History  Problem Relation Age of Onset  . Cancer Sister        brain tumor  . Heart disease Brother   . Alzheimer's disease Brother   . Heart disease Sister   . Alzheimer's disease Father   . Hypertension Sister   . Colon cancer Neg Hx   . Colon polyps Neg Hx   .  Esophageal cancer Neg Hx   . Rectal cancer Neg Hx   . Stomach cancer Neg Hx      Social History   Socioeconomic History  . Marital status: Divorced    Spouse name: Not on file  . Number of children: 2  . Years of education: 38  . Highest education level: Not on file  Occupational History  . 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   Tobacco Use  . Smoking status: Former Smoker    Quit date: 02/15/1981    Years since quitting: 38.7  . Smokeless tobacco: Current User    Types: Chew  Substance and Sexual Activity  . Alcohol use: Yes    Alcohol/week: 5.0 standard drinks    Types: 5 Shots of liquor per week    Comment:  beer or mixed drink  . Drug use: No  . Sexual activity: Not on file  Other Topics Concern  . Not on file  Social History Narrative  . Not on file   Social Determinants of Health   Financial Resource Strain:   . Difficulty of Paying Living Expenses: Not on file  Food Insecurity:   . Worried About Charity fundraiser in the Last Year: Not on file  . Ran Out of Food in the Last Year: Not on file  Transportation Needs:   . Lack of Transportation (Medical): Not on file  . Lack of Transportation (Non-Medical): Not on  file  Physical Activity:   . Days of Exercise per Week: Not on file  . Minutes of Exercise per Session: Not on file  Stress:   . Feeling of Stress : Not on file  Social Connections:   . Frequency of Communication with Friends and Family: Not on file  . Frequency of Social Gatherings with Friends and Family: Not on file  . Attends Religious Services: Not on file  . Active Member of Clubs or Organizations: Not on file  . Attends Archivist Meetings: Not on file  . Marital Status: Not on file  Intimate Partner Violence:   . Fear of Current or Ex-Partner: Not on file  . Emotionally Abused: Not on file  . Physically Abused: Not on file  . Sexually Abused: Not on file     No Known Allergies   Outpatient Medications  Prior to Visit  Medication Sig Dispense Refill  . aspirin EC 81 MG tablet Take 81 mg by mouth See admin instructions. Take one tablet (81 mg) by mouth twice a week at night    . carboxymethylcellulose (REFRESH PLUS) 0.5 % SOLN Place 1 drop into the left eye daily as needed (dry eyes/irritation).     Marland Kitchen lisinopril (ZESTRIL) 20 MG tablet Take 1 tablet (20 mg total) by mouth daily. 90 tablet 3  . Misc Natural Products (NEURIVA PO) Take 1 capsule by mouth daily.     . naproxen sodium (ALEVE) 220 MG tablet Take 220 mg by mouth daily as needed (pain/headache).    . rosuvastatin (CRESTOR) 10 MG tablet Take 1 tablet (10 mg total) by mouth daily. 90 tablet 3   No facility-administered medications prior to visit.    Review of Systems  Constitutional: Negative for chills, diaphoresis, fever, malaise/fatigue and weight loss.  HENT: Negative for congestion, nosebleeds, sinus pain and sore throat.   Eyes: Negative for blurred vision.  Respiratory: Negative for cough, hemoptysis, sputum production, shortness of breath and wheezing.   Cardiovascular: Negative for chest pain, palpitations, orthopnea, claudication, leg swelling and PND.  Gastrointestinal: Negative for abdominal pain, blood in stool, heartburn, melena, nausea and vomiting.  Genitourinary: Negative for dysuria, frequency, hematuria and urgency.  Musculoskeletal: Negative for back pain, joint pain and myalgias.  Skin: Negative for itching and rash.  Neurological: Negative for dizziness, weakness and headaches.  Endo/Heme/Allergies: Does not bruise/bleed easily.  Psychiatric/Behavioral: Negative.     Objective:   Vitals:   11/07/19 1159  BP: 128/72  Pulse: 71  Temp: 97.7 F (36.5 C)  TempSrc: Oral  SpO2: 96%  Weight: 192 lb (87.1 kg)  Height: 5\' 7"  (1.702 m)     Physical Exam Constitutional:      General: He is not in acute distress.    Appearance: Normal appearance. He is normal weight. He is not ill-appearing.  HENT:      Head: Normocephalic and atraumatic.     Nose: Nose normal.     Mouth/Throat:     Mouth: Mucous membranes are moist.     Pharynx: Oropharynx is clear.  Eyes:     General: No scleral icterus.    Extraocular Movements: Extraocular movements intact.     Conjunctiva/sclera: Conjunctivae normal.     Pupils: Pupils are equal, round, and reactive to light.  Cardiovascular:     Rate and Rhythm: Normal rate and regular rhythm.     Pulses: Normal pulses.     Heart sounds: Normal heart sounds. No murmur heard.   Pulmonary:  Effort: Pulmonary effort is normal.     Breath sounds: Examination of the left-middle field reveals decreased breath sounds. Examination of the left-lower field reveals decreased breath sounds. Decreased breath sounds present. No wheezing, rhonchi or rales.  Abdominal:     General: Bowel sounds are normal. There is no distension.     Palpations: Abdomen is soft.     Tenderness: There is no abdominal tenderness.  Musculoskeletal:     Cervical back: Neck supple.     Right lower leg: No edema.     Left lower leg: No edema.  Lymphadenopathy:     Cervical: No cervical adenopathy.  Skin:    General: Skin is warm and dry.  Neurological:     General: No focal deficit present.     Mental Status: He is alert and oriented to person, place, and time. Mental status is at baseline.     Gait: Gait normal.  Psychiatric:        Mood and Affect: Mood normal.        Behavior: Behavior normal.        Thought Content: Thought content normal.        Judgment: Judgment normal.     CBC    Component Value Date/Time   WBC 6.4 10/31/2019 0529   RBC 4.98 10/31/2019 0529   HGB 15.3 10/31/2019 0529   HGB 15.9 09/24/2019 0832   HCT 45.1 10/31/2019 0529   HCT 46.2 09/24/2019 0832   PLT 211 10/31/2019 0529   PLT 227 09/24/2019 0832   MCV 90.6 10/31/2019 0529   MCV 91 09/24/2019 0832   MCH 30.7 10/31/2019 0529   MCHC 33.9 10/31/2019 0529   RDW 11.7 10/31/2019 0529   RDW 12.2  09/24/2019 0832   LYMPHSABS 1.7 10/31/2019 0529   LYMPHSABS 1.7 09/24/2019 0832   MONOABS 0.8 10/31/2019 0529   EOSABS 0.1 10/31/2019 0529   EOSABS 0.2 09/24/2019 0832   BASOSABS 0.0 10/31/2019 0529   BASOSABS 0.0 09/24/2019 0832   BMP Latest Ref Rng & Units 10/31/2019 10/30/2019 10/29/2019  Glucose 70 - 99 mg/dL 122(H) 109(H) 115(H)  BUN 8 - 23 mg/dL 16 14 13   Creatinine 0.61 - 1.24 mg/dL 0.74 0.79 0.68  BUN/Creat Ratio 10 - 24 - - -  Sodium 135 - 145 mmol/L 135 135 137  Potassium 3.5 - 5.1 mmol/L 3.7 3.7 3.5  Chloride 98 - 111 mmol/L 101 101 101  CO2 22 - 32 mmol/L 24 26 24   Calcium 8.9 - 10.3 mg/dL 8.7(L) 8.7(L) 8.8(L)   Pleural Fluid Labs: LDH 244 Total Protein 4.1 PH 7.5 Albumin 2.5 1740 WBCs, 70% lymphocytes Cholesterol 84 Triglycerides 32  Chest imaging: CXR 11/07/19: Increase in left pleural effusion compared to 10/31/19 post-thoracentesis  PFT: 11/01/2011 Normal spirometry. FEV1 83%, FVC 80%  Labs: Reviewed as above.  Path: Pleural Fluid Cytology: FINAL MICROSCOPIC DIAGNOSIS:  - Atypical mesothelial cells present  - See comment   SPECIMEN ADEQUACY:  Satisfactory for evaluation   DIAGNOSTIC COMMENTS:  Immunohistochemical stains show that the atypical cells are positive for  calretinin and D2-40; and negative for TTF-1, CK7, CK20 and PSA,  consistent with mesothelial origin. The findings are concerning for  mesothelioma. Clinical and radiologic correlation and further  evaluation, including biopsy if appropriate, are suggested. Dr. Saralyn Pilar  reviewed the case and concurs with the diagnosis.   Echo: 10/30/19 EF 60-65%. Mild concentric LVH. LV Grade I diastolic dysfunction. RV not well visualized.   Assessment & Plan:  Pleural effusion on left - Plan: DG Chest 2 View  Pleural thickening  Discussion: Jack Haynes is a 79 year old male, former smoker with history of prostate cancer who presented to [facility] on 10/29/19 with shortness of breath and left  pleural effusion. The pleural effusion is a lymphocyte predominant exudative effusion. He also has pleural thickening and nodularity on CT chest imaging. The pleural fluid cytology is concerning for mesothelioma but not confirmatory. Follow up chest radiograph today does indicate re-accumulation of the left effusion. We will refer the patient to thoracic surgery for pleural biopsies. We can send the patient for PET CT scan once the biopsy results have returned.   Freda Jackson, MD [facility] Office: [phone]    Current Outpatient Medications:  .  aspirin EC 81 MG tablet, Take 81 mg by mouth See admin instructions. Take one tablet (81 mg) by mouth twice a week at night, Disp: , Rfl:  .  carboxymethylcellulose (REFRESH PLUS) 0.5 % SOLN, Place 1 drop into the left eye daily as needed (dry eyes/irritation). , Disp: , Rfl:  .  lisinopril (ZESTRIL) 20 MG tablet, Take 1 tablet (20 mg total) by mouth daily., Disp: 90 tablet, Rfl: 3 .  Misc Natural Products (NEURIVA PO), Take 1 capsule by mouth daily. , Disp: , Rfl:  .  naproxen sodium (ALEVE) 220 MG tablet, Take 220 mg by mouth daily as needed (pain/headache)., Disp: , Rfl:  .  rosuvastatin (CRESTOR) 10 MG tablet, Take 1 tablet (10 mg total) by mouth daily., Disp: 90 tablet, Rfl: 3

## 2019-11-07 NOTE — Patient Instructions (Signed)
We will check a chest x-ray today and call you with the results and plans to be referred to thoracic surgery

## 2019-11-09 ENCOUNTER — Other Ambulatory Visit: Payer: Self-pay | Admitting: Cardiothoracic Surgery

## 2019-11-09 DIAGNOSIS — J90 Pleural effusion, not elsewhere classified: Secondary | ICD-10-CM

## 2019-11-12 ENCOUNTER — Institutional Professional Consult (permissible substitution): Payer: Medicare PPO | Admitting: Cardiothoracic Surgery

## 2019-11-12 ENCOUNTER — Other Ambulatory Visit: Payer: Self-pay

## 2019-11-12 ENCOUNTER — Other Ambulatory Visit: Payer: Self-pay | Admitting: *Deleted

## 2019-11-12 ENCOUNTER — Encounter (HOSPITAL_COMMUNITY): Payer: Self-pay | Admitting: Cardiothoracic Surgery

## 2019-11-12 ENCOUNTER — Other Ambulatory Visit (HOSPITAL_COMMUNITY)
Admission: RE | Admit: 2019-11-12 | Discharge: 2019-11-12 | Disposition: A | Discharge status: Home / Self Care | Payer: Medicare PPO | Source: Ambulatory Visit | Attending: Cardiothoracic Surgery | Admitting: Cardiothoracic Surgery

## 2019-11-12 ENCOUNTER — Telehealth: Payer: Self-pay | Admitting: *Deleted

## 2019-11-12 VITALS — BP 131/74 | HR 72 | Temp 97.6°F | Resp 20 | Ht 67.0 in | Wt 192.0 lb

## 2019-11-12 DIAGNOSIS — J90 Pleural effusion, not elsewhere classified: Secondary | ICD-10-CM | POA: Diagnosis not present

## 2019-11-12 DIAGNOSIS — Z01812 Encounter for preprocedural laboratory examination: Secondary | ICD-10-CM | POA: Insufficient documentation

## 2019-11-12 DIAGNOSIS — Z20822 Contact with and (suspected) exposure to covid-19: Secondary | ICD-10-CM | POA: Insufficient documentation

## 2019-11-12 NOTE — Progress Notes (Signed)
Spoke with pt for pre-op call. Pt recently started on HTN meds in August. Denies any cardiac history or Diabetes.   Covid test done today, pt states he's in quarantine and understands that he will stay in quarantine until he comes to the hospital tomorrow morning.

## 2019-11-12 NOTE — Telephone Encounter (Signed)
-----   Message from Freddi Starr, MD sent at 11/07/2019  5:10 PM EDT ----- Regarding: Thoracic Surgery Referral This patient needs to be referred to the Thoracic Surgery team to be seen in the next week in order to be evaluated for a pleural biopsy. I have placed the referral order. If Dr. Kipp Brood is available, that would be great. I have informed the patient that the referral would be made and to expect a call from [place] or the surgery clinic.  Thanks, Wille Glaser

## 2019-11-12 NOTE — Progress Notes (Signed)
[address]       [place] [address]             [phone]                    Decarlos E Arguijo [place] Medical Record #[id number] Date of Birth: 01-14-1941  Referring: Freddi Starr, MD Primary Care: [name], MD Primary Cardiologist: No primary care provider on file.  Chief Complaint:    Chief Complaint  Patient presents with  . Pleural Effusion    Surgical consult, Chest CT 10/30/19, CXR 10/31/19, 11/07/19    History of Present Illness:    Jack Haynes 79 y.o. male is seen in the office  today for evaluation of new onset of a large left pleural effusion.  Noted September 13 increasing , especially with exertion, and paradoxically right chest discomfort.  He was admitted to [facility] a thoracentesis draining 2.5 L of pleural fluid-with total protein of 4.1.  Cytology was positive for atypical mesothelial cells but not diagnostic of malignancy.   The patient comes to the office today noting that he is starting to get the same symptoms back again, shortness of breath and wheezing when he bends over.  During his hospitalization at [facility] echocardiogram showed normal LV function, troponins were negative, BNP was very low.  The patient has a previous smoking history but stopped more than 40 years ago and when he did smoke this for 10 years or less.  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  He notes frequently working on pumps contaminated with sprayed material.  Patient notes that both he and his wife had Covid vaccinations x2 earlier this year  Patient denies any cardiac history, no known history of myocardial infarction or angina.  Current Activity/ Functional Status:  Patient is independent with mobility/ambulation, transfers, ADL's, IADL's.   Zubrod Score: At the time of surgery this patient's most appropriate  activity status/level should be described as: []     0    Normal activity, no symptoms [x]     1    Restricted in physical strenuous activity but ambulatory, able to do out light work []     2    Ambulatory and capable of self care, unable to do work activities, up and about               >50 % of waking hours                              []     3    Only limited self care, in bed greater than 50% of waking hours []     4    Completely disabled, no self care, confined to bed or chair []     5    Moribund   Past Medical History:  Diagnosis Date  . Arthritis   . Cancer (HCC)    hx of prostatae cancer  . Hypertension     Past Surgical History:  Procedure Laterality Date  . APPENDECTOMY    . BACK SURGERY  1998  . EYE SURGERY Left 1965  . JOINT REPLACEMENT Left 2010   left knee replacement  . KNEE SURGERY    . PROSTATE SURGERY    . SPINE SURGERY    . TOTAL KNEE ARTHROPLASTY  02/01/2012   Procedure: TOTAL KNEE ARTHROPLASTY;  Surgeon: Sydnee Cabal, MD;  Location: [facility] ORS;  Service: Orthopedics;  Laterality: Left;    Family History  Problem Relation Age of Onset  . Cancer Sister        brain tumor  . Heart disease Brother   . Alzheimer's disease Brother   . Heart disease Sister   . Alzheimer's disease Father   . Hypertension Sister   . Colon cancer Neg Hx   . Colon polyps Neg Hx   . Esophageal cancer Neg Hx   . Rectal cancer Neg Hx   . Stomach cancer Neg Hx      Social History   Tobacco Use  Smoking Status Former Smoker  . Quit date: 02/15/1981  . Years since quitting: 38.7  Smokeless Tobacco Current User  . Types: Chew    Social History   Substance and Sexual Activity  Alcohol Use Yes  . Alcohol/week: 5.0 standard drinks  . Types: 5 Shots of liquor per week   Comment:  beer or mixed drink - moderate drink about 3 drinks a week     No Known Allergies  Current Outpatient Medications  Medication Sig Dispense Refill  . aspirin EC 81 MG tablet Take 81 mg by mouth  See admin instructions. Take one tablet (81 mg) by mouth twice a week at night    . carboxymethylcellulose (REFRESH PLUS) 0.5 % SOLN Place 1 drop into the left eye daily as needed (dry eyes/irritation).     Marland Kitchen lisinopril (ZESTRIL) 20 MG tablet Take 1 tablet (20 mg total) by mouth daily. 90 tablet 3  . Misc Natural Products (NEURIVA PO) Take 1 capsule by mouth daily.     . naproxen sodium (ALEVE) 220 MG tablet Take 220 mg by mouth daily as needed (pain/headache).    . rosuvastatin (CRESTOR) 10 MG tablet Take 1 tablet (10 mg total) by mouth daily. 90 tablet 3   No current facility-administered medications for this visit.    Pertinent items are noted in HPI.   Review of Systems:     Cardiac Review of Systems: [Y] = yes  or   [ N ] = no   Chest Pain [ n   ]  Resting SOB [   ] Exertional SOB  Blue.Reese  ]  Orthopnea Florencio.Farrier  ]   Pedal Edema [ n  ]    Palpitations Florencio.Farrier  ] Syncope  [ n ]   Presyncope [  n ]   General Review of Systems: [Y] = yes [  ]=no Constitional: recent weight change [  ];  Wt loss over the last 3 months [   ] anorexia [  ]; fatigue [  ]; nausea [  ]; night sweats [  ]; fever [  ]; or chills [  ];           Eye : blurred vision [  ]; diplopia [   ]; vision changes [  ];  Amaurosis fugax[  ]; Resp: cough Blue.Reese  ];  wheezing[ y ];  hemoptysis[ n ]; shortness of breath[  y]; paroxysmal nocturnal dyspnea[  ]; dyspnea on exertion[ y ]; or orthopnea[  ];  GI:  gallstones[  ], vomiting[  ];  dysphagia[  ]; melena[  ];  hematochezia [  ]; heartburn[  ];   Hx of  Colonoscopy[  ]; GU: kidney stones [  ]; hematuria[  ];   dysuria [  ];  nocturia[  ];  history of     obstruction [  ]; urinary frequency [  ]             Skin: rash, swelling[  ];, hair loss[  ];  peripheral edema[  ];  or itching[  ]; Musculosketetal: myalgias[  ];  joint swelling[  ];  joint erythema[  ];  joint pain[  ];  back pain[  ];  Heme/Lymph: bruising[  ];  bleeding[  ];  anemia[  ];  Neuro: TIA[  ];  headaches[  ];  stroke[  ];   vertigo[  ];  seizures[  ];   paresthesias[  ];  difficulty walking[  ];  Psych:depression[  ]; anxiety[  ];  Endocrine: diabetes[ n ];  thyroid dysfunction[  ];  Immunizations: Flu up to date [ y ]; Pneumococcal up to date Blue.Reese  ]; COVID-62 vacination completed  Blue.Reese   ]  Other:     PHYSICAL EXAMINATION: BP 131/74   Pulse 72   Temp 97.6 F (36.4 C) (Skin)   Resp 20   Ht 5\' 7"  (1.702 m)   Wt 192 lb (87.1 kg)   SpO2 97% Comment: RA  BMI 30.07 kg/m  General appearance: alert and cooperative Head: Normocephalic, without obvious abnormality, atraumatic Neck: no adenopathy, no carotid bruit, no JVD, supple, symmetrical, trachea midline and thyroid not enlarged, symmetric, no tenderness/mass/nodules Lymph nodes: Cervical, supraclavicular, and axillary nodes normal. Resp: diminished breath sounds LLL Cardio: regular rate and rhythm, S1, S2 normal, no murmur, click, rub or gallop GI: soft, non-tender; bowel sounds normal; no masses,  no organomegaly Extremities: extremities normal, atraumatic, no cyanosis or edema and Homans sign is negative, no sign of DVT Neurologic: Grossly normal  Diagnostic Studies & Laboratory data:     Recent Radiology Findings:   DG Chest 1 View  Result Date: 10/30/2019 CLINICAL DATA:  Status post left thoracentesis. EXAM: CHEST  1 VIEW COMPARISON:  October 29, 2019. FINDINGS: Stable cardiomediastinal silhouette. No pneumothorax is noted. Minimal right basilar subsegmental atelectasis is noted. No pneumothorax is noted. Left pleural effusion is significantly smaller status post thoracentesis. Bony thorax is unremarkable. IMPRESSION: Left pleural effusion is significantly smaller status post thoracentesis. No pneumothorax is noted. Electronically Signed   By: Marijo Conception M.D.   On: 10/30/2019 12:31   DG Chest 2 View  Result Date: 11/08/2019 CLINICAL DATA:  Left pleural effusion EXAM: CHEST - 2 VIEW COMPARISON:  10/31/2019 FINDINGS: Increase in the left pleural  effusion now moderate in size with further collapse/consolidation of the left lower lung. Stable right lung aeration. Heart is enlarged. No superimposed edema pattern or CHF. No pneumothorax. Trachea midline. Aorta atherosclerotic and degenerative changes of the spine. Nonobstructive bowel gas pattern. IMPRESSION: Enlarging now moderate sized left pleural effusion with further left lower lung collapse/consolidation. Aortic Atherosclerosis (ICD10-I70.0). Electronically Signed   By: Jerilynn Mages.  Shick M.D.   On: 11/08/2019 08:03   DG Chest 2 View  Result Date: 10/31/2019 CLINICAL DATA:  Follow-up pleural effusion. EXAM: CHEST - 2 VIEW COMPARISON:  10/30/2019 FINDINGS: The cardiac silhouette, mediastinal and hilar contours are within normal limits and stable. Persistent but slightly smaller left pleural effusion with overlying atelectasis. IMPRESSION: Persistent but smaller left pleural effusion with overlying atelectasis. Electronically Signed   By: Marijo Sanes M.D.   On: 10/31/2019 08:26   DG Chest 2 View  Result Date: 10/29/2019 CLINICAL DATA:  Dyspnea on exertion. EXAM: CHEST - 2 VIEW COMPARISON:  January 06, 2010. FINDINGS: Interval  development of large left pleural effusion resulting in left to right midline shift. Aerated left upper lobe is noted. No pneumothorax is noted. Right lung is clear. Bony thorax is unremarkable. IMPRESSION: Interval development of large left pleural effusion resulting in left to right midline shift. Electronically Signed   By: Marijo Conception M.D.   On: 10/29/2019 15:38   CT CHEST W CONTRAST  Result Date: 10/30/2019 CLINICAL DATA:  Abnormal chest x-ray, pleural effusion EXAM: CT CHEST WITH CONTRAST TECHNIQUE: Multidetector CT imaging of the chest was performed during intravenous contrast administration. CONTRAST:  46mL OMNIPAQUE IOHEXOL 300 MG/ML  SOLN COMPARISON:  Same-day radiograph, CT 05/15/2014 FINDINGS: Cardiovascular: The aortic root is suboptimally assessed given cardiac  pulsation artifact. Atherosclerotic plaque within the normal caliber aorta. No acute luminal abnormality of the imaged aorta. No periaortic stranding or hemorrhage. Normal 3 vessel branching of the aortic arch. Proximal great vessels are mildly calcified but otherwise unremarkable. Cardiac size within normal limits. Trace pericardial fluid likely at the upper limits of physiologic normal. Small amount of fluid in the pericardial recesses as well. Three-vessel coronary artery disease is noted. Central pulmonary arteries normal caliber. No large central or lobar filling defects on this non tailored examination of the pulmonary arteries. Mediastinum/Nodes: Small amount of fluid in the pericardial recesses, as above. No mediastinal fluid or gas. Normal thyroid gland and thoracic inlet. No acute abnormality of the trachea or esophagus. No worrisome mediastinal, hilar or axillary adenopathy. Lungs/Pleura: There are regions of predominantly peribronchovascular ground-glass opacity throughout the left lung with partial atelectatic collapse of the left lower lobe. A moderate left pleural effusion is present with some fairly circumferential pleural thickening including some pleural nodularity along the left lung periphery and a larger pleural based nodule seen with aggressive when doing in the medial left lung base (2/107). Lobular appearance of the effusion itself. Additional atelectatic changes are present the right lung base. Some mild diffuse airways thickening and scattered secretions are noted. Upper Abdomen: No acute abnormalities present in the visualized portions of the upper abdomen. Musculoskeletal: Multilevel degenerative changes are present in the imaged portions of the spine. No acute osseous abnormality or suspicious osseous lesion. No worrisome chest wall lesions. IMPRESSION: 1. Moderate left pleural effusion with features of loculation, circumferential pleural thickening, and multifocal pleural nodularity.  Findings raise suspicion for a malignant or metastatic pleural process in the left lung with associated pleural effusion. Could consider PET-CT. 2. Patchy regions of predominantly peribronchovascular ground-glass opacity throughout the left lung is more likely to reflect a superimposed infectious/inflammatory process likely in the setting of a poorly ventilating left lung. 3. Three-vessel coronary artery disease. 4. Aortic Atherosclerosis (ICD10-I70.0). Electronically Signed   By: Lovena Le M.D.   On: 10/30/2019 17:07   ECHOCARDIOGRAM COMPLETE  Result Date: 10/30/2019    ECHOCARDIOGRAM REPORT   Patient Name:   Jack Haynes Date of Exam: 10/30/2019 Medical Rec #:  [id number]       Height:       67.0 in Accession #:    [id number]      Weight:       196.0 lb Date of Birth:  1941/01/21       BSA:          2.005 m Patient Age:    47 years        BP:           114/93 mmHg Patient Gender: M  HR:           79 bpm. Exam Location:  Inpatient Procedure: 2D Echo, Cardiac Doppler and Color Doppler Indications:    R06.02 SOB  History:        Patient has prior history of Echocardiogram examinations, most                 recent 06/07/2006. Abnormal ECG; Risk Factors:Hypertension.                 History of cancer. Pleural effusion.  Sonographer:    Roseanna Rainbow RDCS Referring Phys: [id number] Franklin T TU  Sonographer Comments: Technically difficult study due to poor echo windows. IMPRESSIONS  1. Left ventricular ejection fraction, by estimation, is 60 to 65%. The left ventricle has normal function. The left ventricle has no regional wall motion abnormalities. There is mild concentric left ventricular hypertrophy. Left ventricular diastolic parameters are consistent with Grade I diastolic dysfunction (impaired relaxation).  2. Right ventricular systolic function was not well visualized. The right ventricular size is not well visualized. Tricuspid regurgitation signal is inadequate for assessing PA pressure.  3. Large  pleural effusion in the left lateral region.  4. The mitral valve is normal in structure. No evidence of mitral valve regurgitation. No evidence of mitral stenosis.  5. The aortic valve was not well visualized. Aortic valve regurgitation is not visualized.  6. The inferior vena cava is normal in size with greater than 50% respiratory variability, suggesting right atrial pressure of 3 mmHg. FINDINGS  Left Ventricle: Left ventricular ejection fraction, by estimation, is 60 to 65%. The left ventricle has normal function. The left ventricle has no regional wall motion abnormalities. The left ventricular internal cavity size was normal in size. There is  mild concentric left ventricular hypertrophy. Left ventricular diastolic parameters are consistent with Grade I diastolic dysfunction (impaired relaxation). Normal left ventricular filling pressure. Right Ventricle: The right ventricular size is not well visualized. Right vetricular wall thickness was not assessed. Right ventricular systolic function was not well visualized. Tricuspid regurgitation signal is inadequate for assessing PA pressure. Left Atrium: Left atrial size was normal in size. Right Atrium: Right atrial size was normal in size. Pericardium: There is no evidence of pericardial effusion. Mitral Valve: The mitral valve is normal in structure. No evidence of mitral valve regurgitation. No evidence of mitral valve stenosis. Tricuspid Valve: The tricuspid valve is normal in structure. Tricuspid valve regurgitation is not demonstrated. No evidence of tricuspid stenosis. Aortic Valve: The aortic valve was not well visualized. Aortic valve regurgitation is not visualized. Pulmonic Valve: The pulmonic valve was normal in structure. Pulmonic valve regurgitation is not visualized. No evidence of pulmonic stenosis. Aorta: The aortic root is normal in size and structure. Venous: The inferior vena cava is normal in size with greater than 50% respiratory variability,  suggesting right atrial pressure of 3 mmHg. IAS/Shunts: No atrial level shunt detected by color flow Doppler. Additional Comments: There is a large pleural effusion in the left lateral region.  LEFT VENTRICLE PLAX 2D LVIDd:         4.21 cm     Diastology LVIDs:         3.01 cm     LV e' medial:    5.77 cm/s LV PW:         1.20 cm     LV E/e' medial:  12.0 LV IVS:        1.16 cm     LV e' lateral:  9.46 cm/s LVOT diam:     2.00 cm     LV E/e' lateral: 7.3 LV SV:         49 LV SV Index:   25 LVOT Area:     3.14 cm  LV Volumes (MOD) LV vol d, MOD A2C: 50.5 ml LV vol d, MOD A4C: 53.6 ml LV vol s, MOD A2C: 20.9 ml LV vol s, MOD A4C: 17.4 ml LV SV MOD A2C:     29.6 ml LV SV MOD A4C:     53.6 ml LV SV MOD BP:      32.3 ml RIGHT VENTRICLE            IVC RV S prime:     9.36 cm/s  IVC diam: 1.82 cm TAPSE (M-mode): 1.4 cm LEFT ATRIUM             Index       RIGHT ATRIUM           Index LA diam:        3.60 cm 1.80 cm/m  RA Area:     10.40 cm LA Vol (A2C):   30.1 ml 15.01 ml/m RA Volume:   23.60 ml  11.77 ml/m LA Vol (A4C):   19.2 ml 9.58 ml/m LA Biplane Vol: 24.4 ml 12.17 ml/m  AORTIC VALVE LVOT Vmax:   95.30 cm/s LVOT Vmean:  60.600 cm/s LVOT VTI:    0.157 m  AORTA Ao Root diam: 3.60 cm Ao Asc diam:  3.60 cm MITRAL VALVE MV Area (PHT): 2.39 cm    SHUNTS MV Decel Time: 317 msec    Systemic VTI:  0.16 m MV E velocity: 69.40 cm/s  Systemic Diam: 2.00 cm MV A velocity: 75.00 cm/s MV E/A ratio:  0.93 Fransico Him MD Electronically signed by Fransico Him MD Signature Date/Time: 10/30/2019/9:02:29 AM    Final    US THORACENTESIS ASP PLEURAL SPACE W/IMG GUIDE  Result Date: 10/30/2019 INDICATION: Patient with remote history of tobacco use, prostate cancer, hypertension, coronary artery disease; now with dyspnea, large left pleural effusion. Request received for diagnostic and therapeutic left thoracentesis. EXAM: ULTRASOUND GUIDED DIAGNOSTIC AND THERAPEUTIC LEFT THORACENTESIS MEDICATIONS: 1% lidocaine to skin and  subcutaneous tissue COMPLICATIONS: None immediate. PROCEDURE: An ultrasound guided thoracentesis was thoroughly discussed with the patient and questions answered. The benefits, risks, alternatives and complications were also discussed. The patient understands and wishes to proceed with the procedure. Written consent was obtained. Ultrasound was performed to localize and mark an adequate pocket of fluid in the left chest. The area was then prepped and draped in the normal sterile fashion. 1% Lidocaine was used for local anesthesia. Under ultrasound guidance a 6 Fr Safe-T-Centesis catheter was introduced. Thoracentesis was performed. The catheter was removed and a dressing applied. FINDINGS: A total of approximately 2.5 liters of hazy,amber fluid was removed. Samples were sent to the laboratory as requested by the clinical team. IMPRESSION: Successful ultrasound guided diagnostic and therapeutic left thoracentesis yielding 2.5 liters of pleural fluid. Read by: Rowe Robert, PA-C Electronically Signed   By: Jacqulynn Cadet M.D.   On: 10/30/2019 12:16     I have independently reviewed the above radiology studies  and reviewed the findings with the patient.   Cytology Left Effusion CYTOLOGY - NON PAP  CASE: [id number]  PATIENT: Jack Haynes  Non-Gynecological Cytology Report   Clinical History: Remote history of prostate cancer s/p prostatectomy  Specimen Submitted: A. PLEURAL FLUID, LEFT, THORACENTESIS:    FINAL  MICROSCOPIC DIAGNOSIS:  - Atypical mesothelial cells present  - See comment   SPECIMEN ADEQUACY:  Satisfactory for evaluation   DIAGNOSTIC COMMENTS:  Immunohistochemical stains show that the atypical cells are positive for  calretinin and D2-40; and negative for TTF-1, CK7, CK20 and PSA,  consistent with mesothelial origin. The findings are concerning for  mesothelioma. Clinical and radiologic correlation and further  evaluation, including biopsy if appropriate, are suggested.  Dr. Saralyn Pilar  reviewed the case and concurs with the diagnosis.    Recent Lab Findings: Lab Results  Component Value Date   WBC 6.4 10/31/2019   HGB 15.3 10/31/2019   HCT 45.1 10/31/2019   PLT 211 10/31/2019   GLUCOSE 122 (H) 10/31/2019   CHOL 184 09/24/2019   TRIG 94 09/24/2019   HDL 40 09/24/2019   LDLCALC 127 (H) 09/24/2019   ALT 14 10/31/2019   AST 13 (L) 10/31/2019   NA 135 10/31/2019   K 3.7 10/31/2019   CL 101 10/31/2019   CREATININE 0.74 10/31/2019   BUN 16 10/31/2019   CO2 24 10/31/2019   TSH 8.320 (H) 10/30/2019   INR 0.97 01/27/2012   HGBA1C 6.6 (H) 09/27/2019      Assessment / Plan:   #1 new large left pleural effusion-likely malignant, exudative fluid with atypical mesothelial cells, no signs or symptoms of congestive heart failure.-I reviewed the possible diagnoses with the patient and have recommended that we quickly proceed with both drainage and biopsy of the pleural surface to make a definitive diagnosis and start appropriate treatment.  Depending on the biopsy results and findings we will proceed with bronchoscopy, left video-assisted thoracoscopy, drainage of large pleural effusion, pleural biopsy, possible talc pleurodesis and placement of Pleurx catheter.  The patient would like to proceed as soon as possible, we will send him for a Covid test now and arrange for drainage tomorrow.  I  spent  60 min with  the patient face to face taking history, exam, reviewing X-rays with patient and his wife and and  counseling deciding on recommended care.Grace Isaac MD      [address] [place] [address] Office [phone]     11/12/2019 3:26 PM

## 2019-11-13 ENCOUNTER — Other Ambulatory Visit: Payer: Self-pay

## 2019-11-13 ENCOUNTER — Inpatient Hospital Stay (HOSPITAL_COMMUNITY): Payer: Medicare PPO

## 2019-11-13 ENCOUNTER — Inpatient Hospital Stay (HOSPITAL_COMMUNITY)
Admission: RE | Admit: 2019-11-13 | Discharge: 2019-11-15 | DRG: 829 | Disposition: A | Discharge status: Home Health | Payer: Medicare PPO | Attending: Cardiothoracic Surgery | Admitting: Cardiothoracic Surgery

## 2019-11-13 ENCOUNTER — Encounter (HOSPITAL_COMMUNITY): Payer: Self-pay | Admitting: Cardiothoracic Surgery

## 2019-11-13 ENCOUNTER — Encounter (HOSPITAL_COMMUNITY)
Admission: RE | Disposition: A | Discharge status: Home Health | Payer: Self-pay | Source: Home / Self Care | Attending: Cardiothoracic Surgery

## 2019-11-13 ENCOUNTER — Inpatient Hospital Stay (HOSPITAL_COMMUNITY): Payer: Medicare PPO | Admitting: Certified Registered"

## 2019-11-13 DIAGNOSIS — J948 Other specified pleural conditions: Secondary | ICD-10-CM | POA: Diagnosis not present

## 2019-11-13 DIAGNOSIS — Z96652 Presence of left artificial knee joint: Secondary | ICD-10-CM | POA: Diagnosis not present

## 2019-11-13 DIAGNOSIS — I10 Essential (primary) hypertension: Secondary | ICD-10-CM | POA: Diagnosis present

## 2019-11-13 DIAGNOSIS — Z720 Tobacco use: Secondary | ICD-10-CM | POA: Diagnosis not present

## 2019-11-13 DIAGNOSIS — M1990 Unspecified osteoarthritis, unspecified site: Secondary | ICD-10-CM | POA: Diagnosis not present

## 2019-11-13 DIAGNOSIS — Z8249 Family history of ischemic heart disease and other diseases of the circulatory system: Secondary | ICD-10-CM | POA: Diagnosis not present

## 2019-11-13 DIAGNOSIS — C457 Mesothelioma of other sites: Principal | ICD-10-CM | POA: Diagnosis present

## 2019-11-13 DIAGNOSIS — I2510 Atherosclerotic heart disease of native coronary artery without angina pectoris: Secondary | ICD-10-CM | POA: Diagnosis present

## 2019-11-13 DIAGNOSIS — C7802 Secondary malignant neoplasm of left lung: Secondary | ICD-10-CM | POA: Diagnosis not present

## 2019-11-13 DIAGNOSIS — Z85831 Personal history of malignant neoplasm of soft tissue: Secondary | ICD-10-CM

## 2019-11-13 DIAGNOSIS — Z820 Family history of epilepsy and other diseases of the nervous system: Secondary | ICD-10-CM | POA: Diagnosis not present

## 2019-11-13 DIAGNOSIS — Z4682 Encounter for fitting and adjustment of non-vascular catheter: Secondary | ICD-10-CM | POA: Diagnosis not present

## 2019-11-13 DIAGNOSIS — Z20822 Contact with and (suspected) exposure to covid-19: Secondary | ICD-10-CM | POA: Diagnosis present

## 2019-11-13 DIAGNOSIS — J439 Emphysema, unspecified: Secondary | ICD-10-CM | POA: Diagnosis not present

## 2019-11-13 DIAGNOSIS — Z77090 Contact with and (suspected) exposure to asbestos: Secondary | ICD-10-CM

## 2019-11-13 DIAGNOSIS — J9811 Atelectasis: Secondary | ICD-10-CM | POA: Diagnosis not present

## 2019-11-13 DIAGNOSIS — Z09 Encounter for follow-up examination after completed treatment for conditions other than malignant neoplasm: Secondary | ICD-10-CM

## 2019-11-13 DIAGNOSIS — I472 Ventricular tachycardia: Secondary | ICD-10-CM | POA: Diagnosis present

## 2019-11-13 DIAGNOSIS — J910 Malignant pleural effusion: Secondary | ICD-10-CM | POA: Diagnosis present

## 2019-11-13 DIAGNOSIS — Z01818 Encounter for other preprocedural examination: Secondary | ICD-10-CM | POA: Diagnosis not present

## 2019-11-13 DIAGNOSIS — C384 Malignant neoplasm of pleura: Secondary | ICD-10-CM | POA: Diagnosis not present

## 2019-11-13 DIAGNOSIS — Z8546 Personal history of malignant neoplasm of prostate: Secondary | ICD-10-CM | POA: Diagnosis not present

## 2019-11-13 DIAGNOSIS — J90 Pleural effusion, not elsewhere classified: Secondary | ICD-10-CM | POA: Diagnosis not present

## 2019-11-13 DIAGNOSIS — Z809 Family history of malignant neoplasm, unspecified: Secondary | ICD-10-CM | POA: Diagnosis not present

## 2019-11-13 DIAGNOSIS — Z9079 Acquired absence of other genital organ(s): Secondary | ICD-10-CM | POA: Diagnosis not present

## 2019-11-13 DIAGNOSIS — C801 Malignant (primary) neoplasm, unspecified: Secondary | ICD-10-CM | POA: Diagnosis not present

## 2019-11-13 HISTORY — DX: Essential (primary) hypertension: I10

## 2019-11-13 HISTORY — PX: CHEST TUBE INSERTION: SHX231

## 2019-11-13 HISTORY — PX: PLEURAL BIOPSY: SHX5082

## 2019-11-13 HISTORY — PX: TALC PLEURODESIS: SHX2506

## 2019-11-13 HISTORY — PX: VIDEO BRONCHOSCOPY: SHX5072

## 2019-11-13 HISTORY — PX: VIDEO ASSISTED THORACOSCOPY: SHX5073

## 2019-11-13 LAB — BLOOD GAS, ARTERIAL
Acid-Base Excess: 0.5 mmol/L (ref 0.0–2.0)
Bicarbonate: 24.7 mmol/L (ref 20.0–28.0)
FIO2: 21
O2 Saturation: 95.8 %
Patient temperature: 37
pCO2 arterial: 40.4 mmHg (ref 32.0–48.0)
pH, Arterial: 7.403 (ref 7.350–7.450)
pO2, Arterial: 82.4 mmHg — ABNORMAL LOW (ref 83.0–108.0)

## 2019-11-13 LAB — COMPREHENSIVE METABOLIC PANEL
ALT: 20 U/L (ref 0–44)
AST: 16 U/L (ref 15–41)
Albumin: 3.6 g/dL (ref 3.5–5.0)
Alkaline Phosphatase: 66 U/L (ref 38–126)
Anion gap: 8 (ref 5–15)
BUN: 15 mg/dL (ref 8–23)
CO2: 24 mmol/L (ref 22–32)
Calcium: 8.4 mg/dL — ABNORMAL LOW (ref 8.9–10.3)
Chloride: 103 mmol/L (ref 98–111)
Creatinine, Ser: 0.98 mg/dL (ref 0.61–1.24)
GFR calc Af Amer: >60 mL/min (ref >60)
GFR calc non Af Amer: >60 mL/min (ref >60)
Glucose, Bld: 128 mg/dL — ABNORMAL HIGH (ref 70–99)
Potassium: 3.9 mmol/L (ref 3.5–5.1)
Sodium: 135 mmol/L (ref 135–145)
Total Bilirubin: 0.7 mg/dL (ref 0.3–1.2)
Total Protein: 6.6 g/dL (ref 6.5–8.1)

## 2019-11-13 LAB — PROTIME-INR
INR: 1 (ref 0.8–1.2)
Prothrombin Time: 13.2 seconds (ref 11.4–15.2)

## 2019-11-13 LAB — TYPE AND SCREEN
ABO/RH(D): O POS
Antibody Screen: NEGATIVE

## 2019-11-13 LAB — CBC
HCT: 44.3 % (ref 39.0–52.0)
Hemoglobin: 15.1 g/dL (ref 13.0–17.0)
MCH: 30.9 pg (ref 26.0–34.0)
MCHC: 34.1 g/dL (ref 30.0–36.0)
MCV: 90.6 fL (ref 80.0–100.0)
Platelets: 223 10*3/uL (ref 150–400)
RBC: 4.89 MIL/uL (ref 4.22–5.81)
RDW: 11.5 % (ref 11.5–15.5)
WBC: 7.1 10*3/uL (ref 4.0–10.5)
nRBC: 0 % (ref 0.0–0.2)

## 2019-11-13 LAB — SURGICAL PCR SCREEN
MRSA, PCR: NEGATIVE
Staphylococcus aureus: NEGATIVE

## 2019-11-13 LAB — URINALYSIS, ROUTINE W REFLEX MICROSCOPIC
Bilirubin Urine: NEGATIVE
Glucose, UA: NEGATIVE mg/dL
Hgb urine dipstick: NEGATIVE
Ketones, ur: NEGATIVE mg/dL
Leukocytes,Ua: NEGATIVE
Nitrite: NEGATIVE
Protein, ur: NEGATIVE mg/dL
Specific Gravity, Urine: 1.02 (ref 1.005–1.030)
pH: 5 (ref 5.0–8.0)

## 2019-11-13 LAB — SARS CORONAVIRUS 2 (TAT 6-24 HRS): SARS Coronavirus 2: NEGATIVE

## 2019-11-13 LAB — APTT: aPTT: 31 seconds (ref 24–36)

## 2019-11-13 SURGERY — BRONCHOSCOPY, VIDEO-ASSISTED
Anesthesia: General | Site: Chest

## 2019-11-13 MED ORDER — POLYVINYL ALCOHOL 1.4 % OP SOLN: 1.0000 [drp] | OPHTHALMIC | Status: DC | PRN

## 2019-11-13 MED ORDER — CARBOXYMETHYLCELLULOSE SODIUM 0.5 % OP SOLN: 1.0000 [drp] | Freq: Every day | OPHTHALMIC | Status: DC | PRN

## 2019-11-13 MED ORDER — LIDOCAINE HCL (PF) 1 % IJ SOLN
INTRAMUSCULAR | Status: AC
  Filled 2019-11-13: qty 30

## 2019-11-13 MED ORDER — PROPOFOL 10 MG/ML IV BOLUS
INTRAVENOUS | Status: AC
  Filled 2019-11-13: qty 20

## 2019-11-13 MED ORDER — MIDAZOLAM HCL 5 MG/5ML IJ SOLN
INTRAMUSCULAR | Status: DC | PRN
  Administered 2019-11-13: 1 mg via INTRAVENOUS

## 2019-11-13 MED ORDER — PHENYLEPHRINE 40 MCG/ML (10ML) SYRINGE FOR IV PUSH (FOR BLOOD PRESSURE SUPPORT)
PREFILLED_SYRINGE | INTRAVENOUS | Status: AC
  Filled 2019-11-13: qty 10

## 2019-11-13 MED ORDER — PHENYLEPHRINE HCL-NACL 10-0.9 MG/250ML-% IV SOLN
INTRAVENOUS | Status: DC | PRN
  Administered 2019-11-13: 25 ug/min via INTRAVENOUS
  Administered 2019-11-13: 50 ug/min via INTRAVENOUS

## 2019-11-13 MED ORDER — PROPOFOL 10 MG/ML IV BOLUS
INTRAVENOUS | Status: DC | PRN
  Administered 2019-11-13: 200 mg via INTRAVENOUS

## 2019-11-13 MED ORDER — ONDANSETRON HCL 4 MG/2ML IJ SOLN
INTRAMUSCULAR | Status: DC | PRN
  Administered 2019-11-13: 4 mg via INTRAVENOUS

## 2019-11-13 MED ORDER — SODIUM CHLORIDE (PF) 0.9 % IJ SOLN
INTRAMUSCULAR | Status: DC | PRN
  Administered 2019-11-13: 50 mL via INTRAVENOUS

## 2019-11-13 MED ORDER — ACETAMINOPHEN 160 MG/5ML PO SOLN
1000.0000 mg | Freq: Four times a day (QID) | ORAL | Status: DC
  Administered 2019-11-14: 1000 mg via ORAL
  Filled 2019-11-13: qty 40.6

## 2019-11-13 MED ORDER — FENTANYL CITRATE (PF) 100 MCG/2ML IJ SOLN: 25.0000 ug | INTRAMUSCULAR | Status: DC | PRN

## 2019-11-13 MED ORDER — LISINOPRIL 20 MG PO TABS
20.0000 mg | ORAL_TABLET | Freq: Every day | ORAL | Status: DC
  Administered 2019-11-14 – 2019-11-15 (×2): 20 mg via ORAL
  Filled 2019-11-13 (×2): qty 1

## 2019-11-13 MED ORDER — OXYCODONE HCL 5 MG/5ML PO SOLN: 5.0000 mg | Freq: Once | ORAL | Status: DC | PRN

## 2019-11-13 MED ORDER — ONDANSETRON HCL 4 MG/2ML IJ SOLN: 4.0000 mg | Freq: Four times a day (QID) | INTRAMUSCULAR | Status: DC | PRN

## 2019-11-13 MED ORDER — LACTATED RINGERS IV SOLN: INTRAVENOUS | Status: DC

## 2019-11-13 MED ORDER — TRAMADOL HCL 50 MG PO TABS
50.0000 mg | ORAL_TABLET | Freq: Four times a day (QID) | ORAL | Status: DC | PRN
  Administered 2019-11-13: 100 mg via ORAL
  Administered 2019-11-14 – 2019-11-15 (×4): 50 mg via ORAL
  Filled 2019-11-13: qty 2
  Filled 2019-11-13 (×4): qty 1

## 2019-11-13 MED ORDER — CHLORHEXIDINE GLUCONATE 0.12 % MT SOLN
15.0000 mL | Freq: Once | OROMUCOSAL | Status: AC
  Administered 2019-11-13: 15 mL via OROMUCOSAL
  Filled 2019-11-13: qty 15

## 2019-11-13 MED ORDER — BISACODYL 5 MG PO TBEC
10.0000 mg | DELAYED_RELEASE_TABLET | Freq: Every day | ORAL | Status: DC
  Administered 2019-11-13 – 2019-11-14 (×2): 10 mg via ORAL
  Filled 2019-11-13 (×3): qty 2

## 2019-11-13 MED ORDER — ACETAMINOPHEN 500 MG PO TABS
1000.0000 mg | ORAL_TABLET | Freq: Four times a day (QID) | ORAL | Status: DC
  Administered 2019-11-13 – 2019-11-15 (×5): 1000 mg via ORAL
  Filled 2019-11-13 (×6): qty 2

## 2019-11-13 MED ORDER — TALC (STERITALC) POWDER FOR INTRAPLEURAL USE
INTRAPLEURAL | Status: AC
  Filled 2019-11-13: qty 4

## 2019-11-13 MED ORDER — DEXAMETHASONE SODIUM PHOSPHATE 10 MG/ML IJ SOLN
INTRAMUSCULAR | Status: DC | PRN
  Administered 2019-11-13: 10 mg via INTRAVENOUS

## 2019-11-13 MED ORDER — ROSUVASTATIN CALCIUM 5 MG PO TABS
10.0000 mg | ORAL_TABLET | Freq: Every day | ORAL | Status: DC
  Administered 2019-11-13 – 2019-11-15 (×3): 10 mg via ORAL
  Filled 2019-11-13 (×3): qty 2

## 2019-11-13 MED ORDER — MIDAZOLAM HCL 2 MG/2ML IJ SOLN
INTRAMUSCULAR | Status: AC
  Filled 2019-11-13: qty 2

## 2019-11-13 MED ORDER — FENTANYL CITRATE (PF) 250 MCG/5ML IJ SOLN
INTRAMUSCULAR | Status: AC
  Filled 2019-11-13: qty 5

## 2019-11-13 MED ORDER — CEFAZOLIN SODIUM-DEXTROSE 2-4 GM/100ML-% IV SOLN
2.0000 g | Freq: Three times a day (TID) | INTRAVENOUS | Status: AC
  Administered 2019-11-13 – 2019-11-14 (×2): 2 g via INTRAVENOUS
  Filled 2019-11-13 (×2): qty 100

## 2019-11-13 MED ORDER — 0.9 % SODIUM CHLORIDE (POUR BTL) OPTIME
TOPICAL | Status: DC | PRN
  Administered 2019-11-13: 1000 mL
  Administered 2019-11-13: 2000 mL

## 2019-11-13 MED ORDER — SODIUM CHLORIDE 0.45 % IV SOLN: INTRAVENOUS | Status: DC

## 2019-11-13 MED ORDER — CHLORHEXIDINE GLUCONATE CLOTH 2 % EX PADS
6.0000 | MEDICATED_PAD | Freq: Every day | CUTANEOUS | Status: DC
  Administered 2019-11-13 – 2019-11-15 (×2): 6 via TOPICAL

## 2019-11-13 MED ORDER — SUGAMMADEX SODIUM 200 MG/2ML IV SOLN
INTRAVENOUS | Status: DC | PRN
  Administered 2019-11-13: 200 mg via INTRAVENOUS

## 2019-11-13 MED ORDER — EPINEPHRINE PF 1 MG/ML IJ SOLN
INTRAMUSCULAR | Status: AC
  Filled 2019-11-13: qty 1

## 2019-11-13 MED ORDER — TALC 5 G PL SUSR
INTRAPLEURAL | Status: DC | PRN
  Administered 2019-11-13 (×2): 4 g via INTRAPLEURAL

## 2019-11-13 MED ORDER — ROCURONIUM BROMIDE 10 MG/ML (PF) SYRINGE
PREFILLED_SYRINGE | INTRAVENOUS | Status: DC | PRN
  Administered 2019-11-13: 10 mg via INTRAVENOUS
  Administered 2019-11-13: 100 mg via INTRAVENOUS

## 2019-11-13 MED ORDER — OXYCODONE HCL 5 MG PO TABS: 5.0000 mg | ORAL_TABLET | Freq: Once | ORAL | Status: DC | PRN

## 2019-11-13 MED ORDER — BUPIVACAINE HCL (PF) 0.5 % IJ SOLN
INTRAMUSCULAR | Status: AC
  Filled 2019-11-13: qty 30

## 2019-11-13 MED ORDER — SENNOSIDES-DOCUSATE SODIUM 8.6-50 MG PO TABS
1.0000 | ORAL_TABLET | Freq: Every day | ORAL | Status: DC
  Administered 2019-11-13: 1 via ORAL
  Filled 2019-11-13: qty 1

## 2019-11-13 MED ORDER — FENTANYL CITRATE (PF) 100 MCG/2ML IJ SOLN
INTRAMUSCULAR | Status: DC | PRN
Start: 2019-11-13 — End: 2019-11-13
  Administered 2019-11-13: 50 ug via INTRAVENOUS
  Administered 2019-11-13: 150 ug via INTRAVENOUS

## 2019-11-13 MED ORDER — CEFAZOLIN SODIUM-DEXTROSE 2-4 GM/100ML-% IV SOLN
2.0000 g | INTRAVENOUS | Status: AC
  Administered 2019-11-13: 2 g via INTRAVENOUS
  Filled 2019-11-13: qty 100

## 2019-11-13 MED ORDER — BUPIVACAINE LIPOSOME 1.3 % IJ SUSP
INTRAMUSCULAR | Status: DC | PRN
  Administered 2019-11-13: 20 mL

## 2019-11-13 MED ORDER — LIDOCAINE 2% (20 MG/ML) 5 ML SYRINGE
INTRAMUSCULAR | Status: DC | PRN
  Administered 2019-11-13: 40 mg via INTRAVENOUS

## 2019-11-13 MED ORDER — SODIUM CHLORIDE (PF) 0.9 % IJ SOLN
INTRAMUSCULAR | Status: AC
  Filled 2019-11-13: qty 50

## 2019-11-13 MED ORDER — ORAL CARE MOUTH RINSE: 15.0000 mL | Freq: Once | OROMUCOSAL | Status: AC

## 2019-11-13 SURGICAL SUPPLY — 110 items
ADAPTER VALVE BIOPSY EBUS (MISCELLANEOUS) IMPLANT
ADPTR VALVE BIOPSY EBUS (MISCELLANEOUS)
ANCHOR CATH FOLEY SECURE (MISCELLANEOUS) IMPLANT
APPLICATOR TIP COSEAL (VASCULAR PRODUCTS) IMPLANT
APPLICATOR TIP EXT COSEAL (VASCULAR PRODUCTS) IMPLANT
BLADE CLIPPER SURG (BLADE) ×4 IMPLANT
BLANKET WARM CARDIAC ADLT BAIR (MISCELLANEOUS) ×4 IMPLANT
BRUSH CYTOL CELLEBRITY 1.5X140 (MISCELLANEOUS) IMPLANT
BRUSH SCRUB EZ PLAIN DRY (MISCELLANEOUS) ×8 IMPLANT
CANISTER SUCT 3000ML PPV (MISCELLANEOUS) ×4 IMPLANT
CATH ROBINSON RED A/P 18FR (CATHETERS) ×4 IMPLANT
CATH ROBINSON RED A/P 22FR (CATHETERS) ×8 IMPLANT
CATH THORACIC 28FR (CATHETERS) IMPLANT
CATH THORACIC 36FR (CATHETERS) IMPLANT
CATH THORACIC 36FR RT ANG (CATHETERS) IMPLANT
CLEANER TIP ELECTROSURG 2X2 (MISCELLANEOUS) ×8 IMPLANT
CLIP VESOCCLUDE MED 6/CT (CLIP) IMPLANT
CNTNR URN SCR LID CUP LEK RST (MISCELLANEOUS) ×9 IMPLANT
CONN ST 1/4X3/8  BEN (MISCELLANEOUS) ×4
CONN ST 1/4X3/8 BEN (MISCELLANEOUS) ×3 IMPLANT
CONT SPEC 4OZ STRL OR WHT (MISCELLANEOUS) ×12
COVER BACK TABLE 60X90IN (DRAPES) ×4 IMPLANT
COVER SURGICAL LIGHT HANDLE (MISCELLANEOUS) ×4 IMPLANT
COVER TRANSDUCER ULTRASND GEL (DISPOSABLE) ×4 IMPLANT
DEFOGGER SCOPE WARMER CLEARIFY (MISCELLANEOUS) ×4 IMPLANT
DERMABOND ADVANCED (GAUZE/BANDAGES/DRESSINGS) ×2
DERMABOND ADVANCED .7 DNX12 (GAUZE/BANDAGES/DRESSINGS) ×6 IMPLANT
DRAPE C-ARM 42X72 X-RAY (DRAPES) ×4 IMPLANT
DRAPE LAPAROSCOPIC ABDOMINAL (DRAPES) ×8 IMPLANT
DRAPE SLUSH/WARMER DISC (DRAPES) ×4 IMPLANT
DRSG TEGADERM 4X10 (GAUZE/BANDAGES/DRESSINGS) ×4 IMPLANT
ELECT BLADE 4.0 EZ CLEAN MEGAD (MISCELLANEOUS) ×12
ELECT REM PT RETURN 9FT ADLT (ELECTROSURGICAL) ×4
ELECTRODE BLDE 4.0 EZ CLN MEGD (MISCELLANEOUS) ×9 IMPLANT
ELECTRODE REM PT RTRN 9FT ADLT (ELECTROSURGICAL) ×3 IMPLANT
FILTER SMOKE EVAC ULPA (FILTER) ×4 IMPLANT
FORCEPS BIOP RJ4 1.8 (CUTTING FORCEPS) IMPLANT
GAUZE SPONGE 4X4 12PLY STRL (GAUZE/BANDAGES/DRESSINGS) ×4 IMPLANT
GAUZE SPONGE 4X4 12PLY STRL LF (GAUZE/BANDAGES/DRESSINGS) ×4 IMPLANT
GLOVE BIO SURGEON STRL SZ 6.5 (GLOVE) ×8 IMPLANT
GOWN STRL REUS W/ TWL LRG LVL3 (GOWN DISPOSABLE) ×12 IMPLANT
GOWN STRL REUS W/TWL LRG LVL3 (GOWN DISPOSABLE) ×16
KIT BASIN OR (CUSTOM PROCEDURE TRAY) ×4 IMPLANT
KIT CLEAN ENDO COMPLIANCE (KITS) ×4 IMPLANT
KIT PLEURX DRAIN CATH 1000ML (MISCELLANEOUS) ×4 IMPLANT
KIT PLEURX DRAIN CATH 15.5FR (DRAIN) ×4 IMPLANT
KIT SUCTION CATH 14FR (SUCTIONS) ×8 IMPLANT
KIT TURNOVER KIT B (KITS) ×4 IMPLANT
MARKER SKIN DUAL TIP RULER LAB (MISCELLANEOUS) IMPLANT
NEEDLE SPNL 18GX3.5 QUINCKE PK (NEEDLE) ×8 IMPLANT
NS IRRIG 1000ML POUR BTL (IV SOLUTION) ×8 IMPLANT
OIL SILICONE PENTAX (PARTS (SERVICE/REPAIRS)) ×4 IMPLANT
PACK CHEST (CUSTOM PROCEDURE TRAY) ×4 IMPLANT
PACK GENERAL/GYN (CUSTOM PROCEDURE TRAY) ×4 IMPLANT
PACK LAPAROSCOPY BASIN (CUSTOM PROCEDURE TRAY) ×4 IMPLANT
PAD ARMBOARD 7.5X6 YLW CONV (MISCELLANEOUS) ×8 IMPLANT
PASSER SUT SWANSON 36MM LOOP (INSTRUMENTS) IMPLANT
PENCIL BUTTON HOLSTER BLD 10FT (ELECTRODE) ×4 IMPLANT
PENCIL SMOKE EVACUATOR (MISCELLANEOUS) ×4 IMPLANT
SEALANT PROGEL (MISCELLANEOUS) IMPLANT
SEALANT SURG COSEAL 4ML (VASCULAR PRODUCTS) IMPLANT
SEALANT SURG COSEAL 8ML (VASCULAR PRODUCTS) IMPLANT
SET DRAINAGE LINE (MISCELLANEOUS) IMPLANT
SLEEVE SUCTION 125 (MISCELLANEOUS) ×4 IMPLANT
SOL ANTI FOG 6CC (MISCELLANEOUS) ×3 IMPLANT
SOLUTION ANTI FOG 6CC (MISCELLANEOUS) ×1
STOPCOCK 4 WAY LG BORE MALE ST (IV SETS) ×12 IMPLANT
SUT ETHILON 3 0 FSL (SUTURE) ×8 IMPLANT
SUT PROLENE 3 0 SH DA (SUTURE) IMPLANT
SUT PROLENE 4 0 RB 1 (SUTURE)
SUT PROLENE 4-0 RB1 .5 CRCL 36 (SUTURE) IMPLANT
SUT SILK  1 MH (SUTURE) ×24
SUT SILK 1 MH (SUTURE) ×18 IMPLANT
SUT SILK 2 0SH CR/8 30 (SUTURE) ×4 IMPLANT
SUT SILK 3 0SH CR/8 30 (SUTURE) IMPLANT
SUT VIC AB 1 CTX 18 (SUTURE) IMPLANT
SUT VIC AB 1 CTX 36 (SUTURE)
SUT VIC AB 1 CTX36XBRD ANBCTR (SUTURE) IMPLANT
SUT VIC AB 2-0 CTX 36 (SUTURE) IMPLANT
SUT VIC AB 2-0 UR6 27 (SUTURE) ×4 IMPLANT
SUT VIC AB 3-0 X1 27 (SUTURE) ×8 IMPLANT
SUT VICRYL 2 TP 1 (SUTURE) IMPLANT
SYR 20ML ECCENTRIC (SYRINGE) ×4 IMPLANT
SYR 30ML LL (SYRINGE) ×4 IMPLANT
SYR 50ML LL SCALE MARK (SYRINGE) ×4 IMPLANT
SYR 5ML LL (SYRINGE) ×4 IMPLANT
SYR BULB IRRIG 60ML STRL (SYRINGE) ×8 IMPLANT
SYR CONTROL 10ML LL (SYRINGE) ×4 IMPLANT
SYSTEM SAHARA CHEST DRAIN ATS (WOUND CARE) ×4 IMPLANT
TAPE CLOTH 4X10 WHT NS (GAUZE/BANDAGES/DRESSINGS) ×4 IMPLANT
TIP APPLICATOR SPRAY EXTEND 16 (VASCULAR PRODUCTS) IMPLANT
TOWEL GREEN STERILE (TOWEL DISPOSABLE) ×4 IMPLANT
TOWEL GREEN STERILE FF (TOWEL DISPOSABLE) ×4 IMPLANT
TRAP FLUID SMOKE EVACUATOR (MISCELLANEOUS) ×4 IMPLANT
TRAP SPECIMEN MUCUS 40CC (MISCELLANEOUS) ×16 IMPLANT
TRAY FOLEY MTR SLVR 16FR STAT (SET/KITS/TRAYS/PACK) ×16 IMPLANT
TROCAR FLEXIPATH 20X80 (ENDOMECHANICALS) IMPLANT
TROCAR FLEXIPATH THORACIC 15MM (ENDOMECHANICALS) IMPLANT
TROCAR XCEL 12X100 BLDLESS (ENDOMECHANICALS) ×4 IMPLANT
TROCAR XCEL BLADELESS 5X75MML (TROCAR) ×8 IMPLANT
TUBE CONNECTING 12X1/4 (SUCTIONS) ×4 IMPLANT
TUBE CONNECTING 20X1/4 (TUBING) ×4 IMPLANT
TUBING EXTENTION W/L.L. (IV SETS) ×8 IMPLANT
TUBING LAP HI FLOW INSUFFLATIO (TUBING) ×4 IMPLANT
TUNNELER SHEATH ON-Q 11GX8 DSP (PAIN MANAGEMENT) IMPLANT
VALVE BIOPSY  SINGLE USE (MISCELLANEOUS) ×4
VALVE BIOPSY SINGLE USE (MISCELLANEOUS) ×3 IMPLANT
VALVE REPLACEMENT CAP (MISCELLANEOUS) IMPLANT
VALVE SUCTION BRONCHIO DISP (MISCELLANEOUS) ×4 IMPLANT
WATER STERILE IRR 1000ML POUR (IV SOLUTION) ×8 IMPLANT

## 2019-11-13 NOTE — Anesthesia Procedure Notes (Signed)
Procedure Name: Intubation Date/Time: 11/13/2019 8:00 AM Performed by: Lavell Luster, CRNA Pre-anesthesia Checklist: Patient identified, Emergency Drugs available, Suction available, Patient being monitored and Timeout performed Patient Re-evaluated:Patient Re-evaluated prior to induction Oxygen Delivery Method: Circle system utilized Preoxygenation: Pre-oxygenation with 100% oxygen Induction Type: IV induction Laryngoscope Size: Mac, Glidescope and 4 Grade View: Grade I Tube type: Oral Endobronchial tube: Double lumen EBT and Left and 37 Fr Number of attempts: 1 Airway Equipment and Method: Video-laryngoscopy and Bougie stylet Placement Confirmation: breath sounds checked- equal and bilateral and positive ETCO2 Secured at: 31 cm Tube secured with: Tape Dental Injury: Teeth and Oropharynx as per pre-operative assessment  Comments: Attempted to change single lumen ETT over [facility] to DLT for VATS.  DLT would not pass.  DL x 1 with glidescope and 37 Left DLT passed through cords easily.  Verified placement by Dr Marcie Bal with bronchoscope.  Henderson Cloud, CRNA

## 2019-11-13 NOTE — Anesthesia Procedure Notes (Signed)
Procedure Name: Intubation Date/Time: 11/13/2019 7:45 AM Performed by: Lavell Luster, CRNA Pre-anesthesia Checklist: Patient identified, Emergency Drugs available, Suction available, Patient being monitored and Timeout performed Patient Re-evaluated:Patient Re-evaluated prior to induction Oxygen Delivery Method: Circle system utilized Preoxygenation: Pre-oxygenation with 100% oxygen Induction Type: IV induction Ventilation: Mask ventilation without difficulty Laryngoscope Size: Mac and 4 Grade View: Grade II Tube type: Oral Tube size: 8.5 mm Number of attempts: 1 Airway Equipment and Method: Stylet Placement Confirmation: ETT inserted through vocal cords under direct vision,  positive ETCO2 and breath sounds checked- equal and bilateral Secured at: 22 cm Tube secured with: Tape Dental Injury: Teeth and Oropharynx as per pre-operative assessment

## 2019-11-13 NOTE — Hospital Course (Addendum)
History of Present Illness:  Jack Haynes is a 79 yo male referred to [facility] for evaluation of new onset large pleural effusion.  Patient recently presented to [facility] with complaints of increasing shortness of breath, especially with exertion and right sided chest discomfort.  He was admitted to the hospital and underwent Thoracentesis with successful removal of 2.5 L of fluid.  Cytology performed showed atypical mesothelial cells, but did not show malignancy.  Cardiac workup was negative. The patient was discharged home.  He was evaluated by Dr. Servando Snare on 11/12/2019.  At that time the patient continued to complain of similar symptoms.  His shortness of breath remained present and was worse with exertion.  He also experienced wheezing when he bends over.  The patient admitted to previous smoking history, but had quite more than 40 years ago.  He has a long history of working with agricultural pumps.  He does remember some asbestos being present.  He is vaccinated for COVID.  It was felt the patient should undergo VATs procedure with drainage of pleural fluid and pleural biopsy for definitive diagnosis.  The risks and benefits of the procedure were explained to the patient and he was agreeable to proceed.   Hospital Course:   Jack Haynes presented to [facility] on 11/13/2019.  He was taken to the operating room and underwent Video Bronchoscopy, Video Assisted Thoracoscopy with drainage of pleural effusion, pleural biopsy, and placement of Pleur-x drainage catheter.  The patient tolerated the procedure without difficulty, was extubated and taken to the PACU in stable condition.  The patient has done well since surgery.  His chest tube had 625 cc since surgery.  The output improved and was removed on 9/**/2021.

## 2019-11-13 NOTE — Transfer of Care (Signed)
Immediate Anesthesia Transfer of Care Note  Patient: Jack Haynes  Procedure(s) Performed: VIDEO BRONCHOSCOPY (N/A ) VIDEO ASSISTED THORACOSCOPY (Left Chest) PLEURAL BIOPSY (Left ) INSERTION PLEURAL DRAINAGE CATHETER (Left ) possible TALC PLEURADESIS (Left )  Patient Location: PACU  Anesthesia Type:General  Level of Consciousness: awake, alert  and oriented  Airway & Oxygen Therapy: Patient connected to face mask oxygen  Post-op Assessment: Post -op Vital signs reviewed and stable  Post vital signs: stable  Last Vitals:  Vitals Value Taken Time  BP 124/85 11/13/19 1015  Temp    Pulse 70 11/13/19 1017  Resp 16 11/13/19 1017  SpO2 97 % 11/13/19 1017  Vitals shown include unvalidated device data.  Last Pain:  Vitals:   11/13/19 0641  TempSrc:   PainSc: 0-No pain      Patients Stated Pain Goal: 7 (44/92/01 0071)  Complications: No complications documented.

## 2019-11-13 NOTE — H&P (Signed)
[address]       [place] [address]             [phone]                    Jack Haynes [place] Hay[place] Medical Record #[id number] Date of Birth: [birth year]  Referring: Freddi Starr, MD Primary Care: [name], MD Primary Cardiologist: No primary care provider on file.  Chief Complaint:    Chief Complaint  Patient presents with  . Pleural Effusion    Surgical consult, Chest CT 10/30/19, CXR 10/31/19, 11/07/19    History of Present Illness:    Jack Haynes 79 y.o. male was  seen in the office yesterday  for evaluation of new onset of a large left pleural effusion.  Noted September 13 increasing SOB , especially with exertion, and paradoxically right chest discomfort.  He was admitted to [facility] a thoracentesis draining 2.5 L of pleural fluid-with total protein of 4.1.  Cytology was positive for atypical mesothelial cells but not diagnostic of malignancy.   The patient comes to the office today noting that he is starting to get the same symptoms back again, shortness of breath and wheezing when he bends over.  During his hospitalization at [facility] echocardiogram showed normal LV function, troponins were negative, BNP was very low.  The patient has a previous smoking history but stopped more than 40 years ago and when he did smoke this for 10 years or less.  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  He notes frequently working on pumps contaminated with sprayed material.  Patient notes that both he and his wife had Covid vaccinations x2 earlier this year  Patient denies any cardiac history, no known history of myocardial infarction or angina.  Current Activity/ Functional Status:  Patient is independent with mobility/ambulation, transfers, ADL's, IADL's.   Zubrod Score: At the time of surgery this patient's most  appropriate activity status/level should be described as: []     0    Normal activity, no symptoms [x]     1    Restricted in physical strenuous activity but ambulatory, able to do out light work []     2    Ambulatory and capable of self care, unable to do work activities, up and about               >50 % of waking hours                              []     3    Only limited self care, in bed greater than 50% of waking hours []     4    Completely disabled, no self care, confined to bed or chair []     5    Moribund   Past Medical History:  Diagnosis Date  . Arthritis   . Cancer (HCC)    hx of prostatae cancer  . Hypertension     Past Surgical History:  Procedure Laterality Date  . APPENDECTOMY    . BACK SURGERY  1998  . EYE SURGERY Left 1965  . JOINT REPLACEMENT Left 2010   left knee replacement  . KNEE SURGERY    . PROSTATE SURGERY    . SPINE SURGERY    . TOTAL KNEE  ARTHROPLASTY  02/01/2012   Procedure: TOTAL KNEE ARTHROPLASTY;  Surgeon: Sydnee Cabal, MD;  Location: [facility];  Service: Orthopedics;  Laterality: Left;    Family History  Problem Relation Age of Onset  . Cancer Sister        brain tumor  . Heart disease Brother   . Alzheimer's disease Brother   . Heart disease Sister   . Alzheimer's disease Father   . Hypertension Sister   . Colon cancer Neg Hx   . Colon polyps Neg Hx   . Esophageal cancer Neg Hx   . Rectal cancer Neg Hx   . Stomach cancer Neg Hx      Social History   Tobacco Use  Smoking Status Former Smoker  . Quit date: 02/15/1981  . Years since quitting: 38.7  Smokeless Tobacco Current User  . Types: Chew    Social History   Substance and Sexual Activity  Alcohol Use Yes  . Alcohol/week: 5.0 standard drinks  . Types: 5 Shots of liquor per week   Comment:  beer or mixed drink - moderate drink about 3 drinks a week     No Known Allergies  Current Facility-Administered Medications  Medication Dose Route Frequency Provider Last Rate Last  Admin  . ceFAZolin (ANCEF) IVPB 2g/100 mL premix  2 g Intravenous 30 min Pre-Op Grace Isaac, MD      . lactated ringers infusion   Intravenous Continuous Albertha Ghee, MD 10 mL/hr at 11/13/19 0656 New Bag at 11/13/19 3903    Pertinent items are noted in HPI.   Review of Systems:     Cardiac Review of Systems: [Y] = yes  or   [ N ] = no   Chest Pain [ n   ]  Resting SOB [   ] Exertional SOB  Blue.Reese  ]  Orthopnea Florencio.Farrier  ]   Pedal Edema [ n  ]    Palpitations Florencio.Farrier  ] Syncope  [ n ]   Presyncope [  n ]   General Review of Systems: [Y] = yes [  ]=no Constitional: recent weight change [  ];  Wt loss over the last 3 months [   ] anorexia [  ]; fatigue [  ]; nausea [  ]; night sweats [  ]; fever [  ]; or chills [  ];           Eye : blurred vision [  ]; diplopia [   ]; vision changes [  ];  Amaurosis fugax[  ]; Resp: cough Blue.Reese  ];  wheezing[ y ];  hemoptysis[ n ]; shortness of breath[  y]; paroxysmal nocturnal dyspnea[  ]; dyspnea on exertion[ y ]; or orthopnea[  ];  GI:  gallstones[  ], vomiting[  ];  dysphagia[  ]; melena[  ];  hematochezia [  ]; heartburn[  ];   Hx of  Colonoscopy[  ]; GU: kidney stones [  ]; hematuria[  ];   dysuria [  ];  nocturia[  ];  history of     obstruction [  ]; urinary frequency [  ]             Skin: rash, swelling[  ];, hair loss[  ];  peripheral edema[  ];  or itching[  ]; Musculosketetal: myalgias[  ];  joint swelling[  ];  joint erythema[  ];  joint pain[  ];  back pain[  ];  Heme/Lymph: bruising[  ];  bleeding[  ];  anemia[  ];  Neuro: TIA[  ];  headaches[  ];  stroke[  ];  vertigo[  ];  seizures[  ];   paresthesias[  ];  difficulty walking[  ];  Psych:depression[  ]; anxiety[  ];  Endocrine: diabetes[ n ];  thyroid dysfunction[  ];  Immunizations: Flu up to date [ y ]; Pneumococcal up to date Blue.Reese  ]; COVID-57 vacination completed  Blue.Reese   ]  Other:     PHYSICAL EXAMINATION: BP (!) 151/79   Pulse 82   Temp 98 F (36.7 C) (Oral)   Resp 18   SpO2 95%   General appearance: alert and cooperative Head: Normocephalic, without obvious abnormality, atraumatic Neck: no adenopathy, no carotid bruit, no JVD, supple, symmetrical, trachea midline and thyroid not enlarged, symmetric, no tenderness/mass/nodules Lymph nodes: Cervical, supraclavicular, and axillary nodes normal. Resp: diminished breath sounds LLL Cardio: regular rate and rhythm, S1, S2 normal, no murmur, click, rub or gallop GI: soft, non-tender; bowel sounds normal; no masses,  no organomegaly Extremities: extremities normal, atraumatic, no cyanosis or edema and Homans sign is negative, no sign of DVT Neurologic: Grossly normal  Diagnostic Studies & Laboratory data:     Recent Radiology Findings:   Xray this am shoes increasing large  left effusion- back comparable with xray on admission to [facility]   DG Chest 1 View  Result Date: 10/30/2019 CLINICAL DATA:  Status post left thoracentesis. EXAM: CHEST  1 VIEW COMPARISON:  October 29, 2019. FINDINGS: Stable cardiomediastinal silhouette. No pneumothorax is noted. Minimal right basilar subsegmental atelectasis is noted. No pneumothorax is noted. Left pleural effusion is significantly smaller status post thoracentesis. Bony thorax is unremarkable. IMPRESSION: Left pleural effusion is significantly smaller status post thoracentesis. No pneumothorax is noted. Electronically Signed   By: Marijo Conception M.D.   On: 10/30/2019 12:31   DG Chest 2 View  Result Date: 11/08/2019 CLINICAL DATA:  Left pleural effusion EXAM: CHEST - 2 VIEW COMPARISON:  10/31/2019 FINDINGS: Increase in the left pleural effusion now moderate in size with further collapse/consolidation of the left lower lung. Stable right lung aeration. Heart is enlarged. No superimposed edema pattern or CHF. No pneumothorax. Trachea midline. Aorta atherosclerotic and degenerative changes of the spine. Nonobstructive bowel gas pattern. IMPRESSION: Enlarging now moderate sized left pleural  effusion with further left lower lung collapse/consolidation. Aortic Atherosclerosis (ICD10-I70.0). Electronically Signed   By: Jerilynn Mages.  Shick M.D.   On: 11/08/2019 08:03   DG Chest 2 View  Result Date: 10/31/2019 CLINICAL DATA:  Follow-up pleural effusion. EXAM: CHEST - 2 VIEW COMPARISON:  10/30/2019 FINDINGS: The cardiac silhouette, mediastinal and hilar contours are within normal limits and stable. Persistent but slightly smaller left pleural effusion with overlying atelectasis. IMPRESSION: Persistent but smaller left pleural effusion with overlying atelectasis. Electronically Signed   By: Marijo Sanes M.D.   On: 10/31/2019 08:26   DG Chest 2 View  Result Date: 10/29/2019 CLINICAL DATA:  Dyspnea on exertion. EXAM: CHEST - 2 VIEW COMPARISON:  January 06, 2010. FINDINGS: Interval development of large left pleural effusion resulting in left to right midline shift. Aerated left upper lobe is noted. No pneumothorax is noted. Right lung is clear. Bony thorax is unremarkable. IMPRESSION: Interval development of large left pleural effusion resulting in left to right midline shift. Electronically Signed   By: Marijo Conception M.D.   On: 10/29/2019 15:38   CT CHEST W CONTRAST  Result Date: 10/30/2019 CLINICAL DATA:  Abnormal chest x-ray, pleural effusion EXAM:  CT CHEST WITH CONTRAST TECHNIQUE: Multidetector CT imaging of the chest was performed during intravenous contrast administration. CONTRAST:  72mL OMNIPAQUE IOHEXOL 300 MG/ML  SOLN COMPARISON:  Same-day radiograph, CT 05/15/2014 FINDINGS: Cardiovascular: The aortic root is suboptimally assessed given cardiac pulsation artifact. Atherosclerotic plaque within the normal caliber aorta. No acute luminal abnormality of the imaged aorta. No periaortic stranding or hemorrhage. Normal 3 vessel branching of the aortic arch. Proximal great vessels are mildly calcified but otherwise unremarkable. Cardiac size within normal limits. Trace pericardial fluid likely at the  upper limits of physiologic normal. Small amount of fluid in the pericardial recesses as well. Three-vessel coronary artery disease is noted. Central pulmonary arteries normal caliber. No large central or lobar filling defects on this non tailored examination of the pulmonary arteries. Mediastinum/Nodes: Small amount of fluid in the pericardial recesses, as above. No mediastinal fluid or gas. Normal thyroid gland and thoracic inlet. No acute abnormality of the trachea or esophagus. No worrisome mediastinal, hilar or axillary adenopathy. Lungs/Pleura: There are regions of predominantly peribronchovascular ground-glass opacity throughout the left lung with partial atelectatic collapse of the left lower lobe. A moderate left pleural effusion is present with some fairly circumferential pleural thickening including some pleural nodularity along the left lung periphery and a larger pleural based nodule seen with aggressive when doing in the medial left lung base (2/107). Lobular appearance of the effusion itself. Additional atelectatic changes are present the right lung base. Some mild diffuse airways thickening and scattered secretions are noted. Upper Abdomen: No acute abnormalities present in the visualized portions of the upper abdomen. Musculoskeletal: Multilevel degenerative changes are present in the imaged portions of the spine. No acute osseous abnormality or suspicious osseous lesion. No worrisome chest wall lesions. IMPRESSION: 1. Moderate left pleural effusion with features of loculation, circumferential pleural thickening, and multifocal pleural nodularity. Findings raise suspicion for a malignant or metastatic pleural process in the left lung with associated pleural effusion. Could consider PET-CT. 2. Patchy regions of predominantly peribronchovascular ground-glass opacity throughout the left lung is more likely to reflect a superimposed infectious/inflammatory process likely in the setting of a poorly  ventilating left lung. 3. Three-vessel coronary artery disease. 4. Aortic Atherosclerosis (ICD10-I70.0). Electronically Signed   By: Lovena Le M.D.   On: 10/30/2019 17:07   ECHOCARDIOGRAM COMPLETE  Result Date: 10/30/2019    ECHOCARDIOGRAM REPORT   Patient Name:   Jack Haynes Date of Exam: 10/30/2019 Medical Rec #:  [id number]       Height:       67.0 in Accession #:    [id number]      Weight:       196.0 lb Date of Birth:  08/02/1940       BSA:          2.005 m Patient Age:    30 years        BP:           114/93 mmHg Patient Gender: M               HR:           79 bpm. Exam Location:  Inpatient Procedure: 2D Echo, Cardiac Doppler and Color Doppler Indications:    R06.02 SOB  History:        Patient has prior history of Echocardiogram examinations, most                 recent 06/07/2006. Abnormal ECG; Risk Factors:Hypertension.  History of cancer. Pleural effusion.  Sonographer:    Roseanna Rainbow RDCS Referring Phys: [id number] [place] [facility]  Sonographer Comments: Technically difficult study due to poor echo windows. IMPRESSIONS  1. Left ventricular ejection fraction, by estimation, is 60 to 65%. The left ventricle has normal function. The left ventricle has no regional wall motion abnormalities. There is mild concentric left ventricular hypertrophy. Left ventricular diastolic parameters are consistent with Grade I diastolic dysfunction (impaired relaxation).  2. Right ventricular systolic function was not well visualized. The right ventricular size is not well visualized. Tricuspid regurgitation signal is inadequate for assessing PA pressure.  3. Large pleural effusion in the left lateral region.  4. The mitral valve is normal in structure. No evidence of mitral valve regurgitation. No evidence of mitral stenosis.  5. The aortic valve was not well visualized. Aortic valve regurgitation is not visualized.  6. The inferior vena cava is normal in size with greater than 50% respiratory variability,  suggesting right atrial pressure of 3 mmHg. FINDINGS  Left Ventricle: Left ventricular ejection fraction, by estimation, is 60 to 65%. The left ventricle has normal function. The left ventricle has no regional wall motion abnormalities. The left ventricular internal cavity size was normal in size. There is  mild concentric left ventricular hypertrophy. Left ventricular diastolic parameters are consistent with Grade I diastolic dysfunction (impaired relaxation). Normal left ventricular filling pressure. Right Ventricle: The right ventricular size is not well visualized. Right vetricular wall thickness was not assessed. Right ventricular systolic function was not well visualized. Tricuspid regurgitation signal is inadequate for assessing PA pressure. Left Atrium: Left atrial size was normal in size. Right Atrium: Right atrial size was normal in size. Pericardium: There is no evidence of pericardial effusion. Mitral Valve: The mitral valve is normal in structure. No evidence of mitral valve regurgitation. No evidence of mitral valve stenosis. Tricuspid Valve: The tricuspid valve is normal in structure. Tricuspid valve regurgitation is not demonstrated. No evidence of tricuspid stenosis. Aortic Valve: The aortic valve was not well visualized. Aortic valve regurgitation is not visualized. Pulmonic Valve: The pulmonic valve was normal in structure. Pulmonic valve regurgitation is not visualized. No evidence of pulmonic stenosis. Aorta: The aortic root is normal in size and structure. Venous: The inferior vena cava is normal in size with greater than 50% respiratory variability, suggesting right atrial pressure of 3 mmHg. IAS/Shunts: No atrial level shunt detected by color flow Doppler. Additional Comments: There is a large pleural effusion in the left lateral region.  LEFT VENTRICLE PLAX 2D LVIDd:         4.21 cm     Diastology LVIDs:         3.01 cm     LV e' medial:    5.77 cm/s LV PW:         1.20 cm     LV E/e'  medial:  12.0 LV IVS:        1.16 cm     LV e' lateral:   9.46 cm/s LVOT diam:     2.00 cm     LV E/e' lateral: 7.3 LV SV:         49 LV SV Index:   25 LVOT Area:     3.14 cm  LV Volumes (MOD) LV vol d, MOD A2C: 50.5 ml LV vol d, MOD A4C: 53.6 ml LV vol s, MOD A2C: 20.9 ml LV vol s, MOD A4C: 17.4 ml LV SV MOD A2C:     29.6  ml LV SV MOD A4C:     53.6 ml LV SV MOD BP:      32.3 ml RIGHT VENTRICLE            IVC RV S prime:     9.36 cm/s  IVC diam: 1.82 cm TAPSE (M-mode): 1.4 cm LEFT ATRIUM             Index       RIGHT ATRIUM           Index LA diam:        3.60 cm 1.80 cm/m  RA Area:     10.40 cm LA Vol (A2C):   30.1 ml 15.01 ml/m RA Volume:   23.60 ml  11.77 ml/m LA Vol (A4C):   19.2 ml 9.58 ml/m LA Biplane Vol: 24.4 ml 12.17 ml/m  AORTIC VALVE LVOT Vmax:   95.30 cm/s LVOT Vmean:  60.600 cm/s LVOT VTI:    0.157 m  AORTA Ao Root diam: 3.60 cm Ao Asc diam:  3.60 cm MITRAL VALVE MV Area (PHT): 2.39 cm    SHUNTS MV Decel Time: 317 msec    Systemic VTI:  0.16 m MV E velocity: 69.40 cm/s  Systemic Diam: 2.00 cm MV A velocity: 75.00 cm/s MV E/A ratio:  0.93 Fransico Him MD Electronically signed by Fransico Him MD Signature Date/Time: 10/30/2019/9:02:29 AM    Final    US THORACENTESIS ASP PLEURAL SPACE W/IMG GUIDE  Result Date: 10/30/2019 INDICATION: Patient with remote history of tobacco use, prostate cancer, hypertension, coronary artery disease; now with dyspnea, large left pleural effusion. Request received for diagnostic and therapeutic left thoracentesis. EXAM: ULTRASOUND GUIDED DIAGNOSTIC AND THERAPEUTIC LEFT THORACENTESIS MEDICATIONS: 1% lidocaine to skin and subcutaneous tissue COMPLICATIONS: None immediate. PROCEDURE: An ultrasound guided thoracentesis was thoroughly discussed with the patient and questions answered. The benefits, risks, alternatives and complications were also discussed. The patient understands and wishes to proceed with the procedure. Written consent was obtained. Ultrasound was  performed to localize and mark an adequate pocket of fluid in the left chest. The area was then prepped and draped in the normal sterile fashion. 1% Lidocaine was used for local anesthesia. Under ultrasound guidance a 6 Fr Safe-T-Centesis catheter was introduced. Thoracentesis was performed. The catheter was removed and a dressing applied. FINDINGS: A total of approximately 2.5 liters of hazy,amber fluid was removed. Samples were sent to the laboratory as requested by the clinical team. IMPRESSION: Successful ultrasound guided diagnostic and therapeutic left thoracentesis yielding 2.5 liters of pleural fluid. Read by: Rowe Robert, PA-C Electronically Signed   By: Jacqulynn Cadet M.D.   On: 10/30/2019 12:16     I have independently reviewed the above radiology studies  and reviewed the findings with the patient.   Cytology Left Effusion CYTOLOGY - NON PAP  CASE: [id number]  PATIENT: Jack Haynes  Non-Gynecological Cytology Report   Clinical History: Remote history of prostate cancer s/p prostatectomy  Specimen Submitted: A. PLEURAL FLUID, LEFT, THORACENTESIS:    FINAL MICROSCOPIC DIAGNOSIS:  - Atypical mesothelial cells present  - See comment   SPECIMEN ADEQUACY:  Satisfactory for evaluation   DIAGNOSTIC COMMENTS:  Immunohistochemical stains show that the atypical cells are positive for  calretinin and D2-40; and negative for TTF-1, CK7, CK20 and PSA,  consistent with mesothelial origin. The findings are concerning for  mesothelioma. Clinical and radiologic correlation and further  evaluation, including biopsy if appropriate, are suggested. Dr. Saralyn Pilar  reviewed the case and concurs with the diagnosis.  Recent Lab Findings: Lab Results  Component Value Date   WBC 6.4 10/31/2019   HGB 15.3 10/31/2019   HCT 45.1 10/31/2019   PLT 211 10/31/2019   GLUCOSE 122 (H) 10/31/2019   CHOL 184 09/24/2019   TRIG 94 09/24/2019   HDL 40 09/24/2019   LDLCALC 127 (H) 09/24/2019    ALT 14 10/31/2019   AST 13 (L) 10/31/2019   NA 135 10/31/2019   K 3.7 10/31/2019   CL 101 10/31/2019   CREATININE 0.74 10/31/2019   BUN 16 10/31/2019   CO2 24 10/31/2019   TSH 8.320 (H) 10/30/2019   INR 0.97 01/27/2012   HGBA1C 6.6 (H) 09/27/2019      Assessment / Plan:   #1 new large left pleural effusion-likely malignant, exudative fluid with atypical mesothelial cells, no signs or symptoms of congestive heart failure.-I reviewed the possible diagnoses with the patient and have recommended that we quickly proceed with both drainage and biopsy of the pleural surface to make a definitive diagnosis and start appropriate treatment.  Depending on the biopsy results and findings we will proceed with bronchoscopy, left video-assisted thoracoscopy, drainage of large pleural effusion, pleural biopsy, possible talc pleurodesis and placement of Pleurx catheter.  The goals risks and alternatives of the planned surgical procedure Procedure(s): VIDEO BRONCHOSCOPY (N/A) VIDEO ASSISTED THORACOSCOPY (Left) PLEURAL BIOPSY (Left) INSERTION PLEURAL DRAINAGE CATHETER (Left) possible TALC PLEURADESIS (Left)  have been discussed with the patient in detail. The risks of the procedure including death, infection, stroke, myocardial infarction, bleeding, blood transfusion have all been discussed specifically.  I have quoted Robie [place] a 1 % of perioperative mortality and a complication rate as high as 20 %. The patient's questions have been answered.Jack Haynes is willing  to proceed with the planned procedure.    Grace Isaac MD      [address] [place] [address] Office [phone]     11/13/2019 7:09 AM

## 2019-11-13 NOTE — Brief Op Note (Signed)
      [address]       [place] [address]             [phone]      11/13/2019  10:45 AM  PATIENT:  Jack Haynes  79 y.o. male  PRE-OPERATIVE DIAGNOSIS:  left pleural effusion  POST-OPERATIVE DIAGNOSIS: malignant pleural effusion    PROCEDURE:  Procedure(s):  VIDEO BRONCHOSCOPY (N/A)  VIDEO ASSISTED THORACOSCOPY (Left)  PLEURAL BIOPSY (Left)  INSERTION PLEURAL DRAINAGE CATHETER (Left)  TALC PLEURADESIS (Left)  SURGEON:  Surgeon(s) and Role:    * Grace Isaac, MD - Primary  PHYSICIAN ASSISTANT: Ellwood Handler PA-C  ANESTHESIA:   general  EBL:  Minimial, 2 liters of bloody effusion drained   BLOOD ADMINISTERED:none  DRAINS: 28 Blake Drain, Pleur-x catheter   LOCAL MEDICATIONS USED:  BUPIVICAINE   SPECIMEN:  Source of Specimen:  pleural fluid, pleural implants  DISPOSITION OF SPECIMEN:  PATHOLOGY  COUNTS:  YES  DICTATION: .Dragon Dictation  PLAN OF CARE: Admit to inpatient   PATIENT DISPOSITION:  PACU - hemodynamically stable.   Delay start of Pharmacological VTE agent (>24hrs) due to surgical blood loss or risk of bleeding: no

## 2019-11-13 NOTE — Anesthesia Procedure Notes (Signed)
Arterial Line Insertion Start/End9/28/2021 7:15 AM, 11/13/2019 7:20 AM Performed by: Lavell Luster, CRNA, CRNA  Preanesthetic checklist: patient identified, IV checked, risks and benefits discussed, surgical consent, pre-op evaluation and timeout performed Lidocaine 1% used for infiltration Right, radial was placed Catheter size: 20 G Hand hygiene performed , maximum sterile barriers used  and Seldinger technique used Allen's test indicative of satisfactory collateral circulation Attempts: 1 Procedure performed without using ultrasound guided technique. Following insertion, Biopatch and dressing applied. Post procedure assessment: normal  Patient tolerated the procedure well with no immediate complications.

## 2019-11-13 NOTE — TOC Progression Note (Addendum)
Transition of Care [facility]) - Progression Note    Patient Details  Name: Jack Haynes MRN: [id number] Date of Birth: April 24, 1940  Transition of Care [facility]) CM/SW Contact  Zenon Mayo, RN Phone Number: 11/13/2019, 4:48 PM  Clinical Narrative:    NCM spoke with son [name], he said to call patient's girfriend , [name] who is with him now in recovery.  NCM contacted [name], she states they have no preference of the agency for [facility] .  NCM made referral to [facility] with [facility].   Awaiting to hear back from [name] to see if can take referral.   9/29- NCM received call from [facility] with [facility] stating they will not have the staff for this patient.  NCM made referral to [facility] with [name], he states he will be able to take this patient, informed him patient will have pleurx drain and possible infusion.  NCM faxed carefusion order to [facility].      Expected Discharge Plan and Services                                                 Social Determinants of Health (SDOH) Interventions    Readmission Risk Interventions No flowsheet data found.

## 2019-11-13 NOTE — Anesthesia Preprocedure Evaluation (Addendum)
Anesthesia Evaluation  Patient identified by MRN, date of birth, ID band Patient awake    Reviewed: Allergy & Precautions, H&P , NPO status , Patient's Chart, lab work & pertinent test results  Airway Mallampati: II   Neck ROM: full    Dental  (+) Dental Advisory Given, Chipped,    Pulmonary former smoker,  Pleural effusion   breath sounds clear to auscultation       Cardiovascular hypertension,  Rhythm:regular Rate:Normal     Neuro/Psych    GI/Hepatic   Endo/Other    Renal/GU      Musculoskeletal  (+) Arthritis ,   Abdominal   Peds  Hematology   Anesthesia Other Findings   Reproductive/Obstetrics                            Anesthesia Physical Anesthesia Plan  ASA: III  Anesthesia Plan: General   Post-op Pain Management:    Induction: Intravenous  PONV Risk Score and Plan: 2 and Ondansetron, Dexamethasone, Midazolam and Treatment may vary due to age or medical condition  Airway Management Planned: Double Lumen EBT  Additional Equipment: Arterial line  Intra-op Plan:   Post-operative Plan:   Informed Consent: I have reviewed the patients History and Physical, chart, labs and discussed the procedure including the risks, benefits and alternatives for the proposed anesthesia with the patient or authorized representative who has indicated his/her understanding and acceptance.       Plan Discussed with: CRNA, Anesthesiologist and Surgeon  Anesthesia Plan Comments:         Anesthesia Quick Evaluation

## 2019-11-14 ENCOUNTER — Encounter (HOSPITAL_COMMUNITY): Payer: Self-pay | Admitting: Cardiothoracic Surgery

## 2019-11-14 ENCOUNTER — Inpatient Hospital Stay (HOSPITAL_COMMUNITY): Payer: Medicare PPO

## 2019-11-14 LAB — CBC
HCT: 40.9 % (ref 39.0–52.0)
Hemoglobin: 13.8 g/dL (ref 13.0–17.0)
MCH: 29.9 pg (ref 26.0–34.0)
MCHC: 33.7 g/dL (ref 30.0–36.0)
MCV: 88.5 fL (ref 80.0–100.0)
Platelets: 207 10*3/uL (ref 150–400)
RBC: 4.62 MIL/uL (ref 4.22–5.81)
RDW: 11.4 % — ABNORMAL LOW (ref 11.5–15.5)
WBC: 13.3 10*3/uL — ABNORMAL HIGH (ref 4.0–10.5)
nRBC: 0 % (ref 0.0–0.2)

## 2019-11-14 LAB — BASIC METABOLIC PANEL
Anion gap: 9 (ref 5–15)
BUN: 15 mg/dL (ref 8–23)
CO2: 22 mmol/L (ref 22–32)
Calcium: 8.6 mg/dL — ABNORMAL LOW (ref 8.9–10.3)
Chloride: 102 mmol/L (ref 98–111)
Creatinine, Ser: 0.98 mg/dL (ref 0.61–1.24)
GFR calc Af Amer: >60 mL/min (ref >60)
GFR calc non Af Amer: >60 mL/min (ref >60)
Glucose, Bld: 191 mg/dL — ABNORMAL HIGH (ref 70–99)
Potassium: 5.1 mmol/L (ref 3.5–5.1)
Sodium: 133 mmol/L — ABNORMAL LOW (ref 135–145)

## 2019-11-14 MED ORDER — ENOXAPARIN SODIUM 40 MG/0.4ML ~~LOC~~ SOLN
40.0000 mg | SUBCUTANEOUS | Status: DC
  Filled 2019-11-14 (×2): qty 0.4

## 2019-11-14 NOTE — Progress Notes (Addendum)
      [address]       [place] [address]             [phone]      1 Day Post-Op Procedure(s) (LRB): VIDEO BRONCHOSCOPY (N/A) VIDEO ASSISTED THORACOSCOPY (Left) PLEURAL BIOPSY (Left) INSERTION PLEURAL DRAINAGE CATHETER (Left)  TALC PLEURADESIS (Left)   Subjective:  Patient sitting up in bed eating breakfast,  He has no complaints.   Feels like he is breathing better.  Objective: Vital signs in last 24 hours: Temp:  [97.3 F (36.3 C)-98.2 F (36.8 C)] 97.6 F (36.4 C) (09/29 0351) Pulse Rate:  [69-99] 74 (09/29 0351) Cardiac Rhythm: Normal sinus rhythm (09/28 1903) Resp:  [13-20] 18 (09/29 0351) BP: (113-158)/(65-93) 121/92 (09/29 0351) SpO2:  [91 %-96 %] 93 % (09/29 0351) Arterial Line BP: (133-135)/(53-56) 135/56 (09/28 1045)  Intake/Output from previous day: 09/28 0701 - 09/29 0700 In: 2160 [P.O.:360; I.V.:1500; IV Piggyback:300] Out: 3100 [Urine:2650; Chest Tube:450]  General appearance: alert, cooperative and no distress Heart: regular rate and rhythm Lungs: diminished breath sounds bibasilar Abdomen: soft, non-tender; bowel sounds normal; no masses,  no organomegaly Extremities: extremities normal, atraumatic, no cyanosis or edema Wound: clean and dry  Lab Results: Recent Labs    11/13/19 0716 11/14/19 0235  WBC 7.1 13.3*  HGB 15.1 13.8  HCT 44.3 40.9  PLT 223 207   BMET:  Recent Labs    11/13/19 0716 11/14/19 0235  NA 135 133*  K 3.9 5.1  CL 103 102  CO2 24 22  GLUCOSE 128* 191*  BUN 15 15  CREATININE 0.98 0.98  CALCIUM 8.4* 8.6*    PT/INR:  Recent Labs    11/13/19 0716  LABPROT 13.2  INR 1.0   ABG    Component Value Date/Time   PHART 7.403 11/13/2019 0720   HCO3 24.7 11/13/2019 0720   O2SAT 95.8 11/13/2019 0720   CBG (last 3)  No results for input(s): GLUCAP in the last 72 hours.  Assessment/Plan: S/P Procedure(s) (LRB): VIDEO BRONCHOSCOPY (N/A) VIDEO ASSISTED THORACOSCOPY (Left) PLEURAL BIOPSY  (Left) INSERTION PLEURAL DRAINAGE CATHETER (Left) possible TALC PLEURADESIS (Left)  1. CV- NSR, BP stable- home Zestril ordered 2. Pulm- CT with 625 cc output since surgery, likely leave chest tube in place today, can hopefully remove in AM, plan to start Pleur-x drainage after chest tube removal 3. D/C Foley catheter, IV Fluids 4. Lovenox for DVT prophylaxis 5. Repeat CXR in AM   LOS: 1 day    Jack Handler, PA-C 11/14/2019  Foley out today Likely d/c chest tube tomorrow and d/c home with daily pleurix drainage  Chest xray today improved this am Path pending I have seen and examined Jack Haynes and agree with the above assessment  and plan.  Jack Isaac MD Beeper [phone] Office [phone] 11/14/2019 8:04 AM

## 2019-11-14 NOTE — Anesthesia Postprocedure Evaluation (Signed)
Anesthesia Post Note  Patient: Jack Haynes  Procedure(s) Performed: VIDEO BRONCHOSCOPY (N/A ) VIDEO ASSISTED THORACOSCOPY (Left Chest) PLEURAL BIOPSY (Left ) INSERTION PLEURAL DRAINAGE CATHETER (Left ) possible TALC PLEURADESIS (Left )     Patient location during evaluation: PACU Anesthesia Type: General Level of consciousness: awake and alert Pain management: pain level controlled Vital Signs Assessment: post-procedure vital signs reviewed and stable Respiratory status: spontaneous breathing, nonlabored ventilation, respiratory function stable and patient connected to nasal cannula oxygen Cardiovascular status: blood pressure returned to baseline and stable Postop Assessment: no apparent nausea or vomiting Anesthetic complications: no   No complications documented.  Last Vitals:  Vitals:   11/13/19 2300 11/14/19 0351  BP: 132/83 (!) 121/92  Pulse:  74  Resp:  18  Temp: 36.7 C 36.4 C  SpO2:  93%    Last Pain:  Vitals:   11/14/19 0720  TempSrc:   PainSc: 0-No pain                 Amariah Kierstead S

## 2019-11-14 NOTE — Op Note (Signed)
Jack Haynes, Jack Haynes MEDICAL RECORD [id number] ACCOUNT [id number] DATE OF BIRTH:[birth year] FACILITY: [facility] LOCATION: [facility] PHYSICIAN:Aayushi Solorzano Maryruth Bun, MD  OPERATIVE REPORT  DATE OF PROCEDURE:  11/13/2019  PREOPERATIVE DIAGNOSIS:  Large left pleural effusion, recurrent.  POSTOPERATIVE DIAGNOSIS:  Large left pleural effusion, recurrent with preliminary pleural biopsy positive for malignancy.    PROCEDURE PERFORMED:  Bronchoscopy, left video-assisted thoracoscopy with drainage of pleural effusion, multiple pleural biopsies, talc pleurodesis, placement of PleurX catheter, and chest tube.  SURGEON:  Lanelle Bal, MD  FIRST ASSISTANT:  Ellwood Handler, PA-C  BRIEF HISTORY:  The patient is a 79 year old male who had presented to [place] earlier in the month of September with a large left pleural effusion.  At that time he was treated with thoracentesis and CT of the chest.  No definitive diagnosis was  made and the patient was discharged home.  He was then referred to the thoracic surgery office the day prior to surgery with recurrent moderate to large left pleural effusion.  We discussed the options of drainage, but with the need to make a definitive  diagnosis.  It was recommended to the patient that we proceed the following day with video-assisted thoracoscopy, pleural biopsies, and drainage and possible talc pleurodesis.  Risks and options were discussed with the patient.  He was agreeable with  proceeding.  DESCRIPTION OF PROCEDURE:  The patient underwent general endotracheal anesthesia without incident.  A single lumen endotracheal tube was placed.  Appropriate timeout was performed and then we proceeded with video bronchoscopy to the subsegmental level in  both the left and right tracheobronchial trees through the single lumen endotracheal tube.  No definite endobronchial lesions were noted.  The left lower lobe bronchus appeared to have mild external compression, probably from  collapse of the left lower  lobe via the effusion.  We then removed the scope.  A double lumen endotracheal tube was placed.  The patient was then turned in lateral decubitus position with the left side up.  The left side had been preoperatively marked.  Left chest was prepped with  Betadine, draped in the usual sterile manner.  A 2nd timeout was performed.  We then proceeded approximately 8th intercostal space posterior axillary line with a small incision after infiltrating Exparel.  Using a 5 mm trocar and a 0-degree scope, the  trocar was introduced into the left chest.  Through this, a significant amount of bloody pleural fluid was drained.  We then converted to use of the 30-degree scope and with mild insufflation, we were able to visualize the chest wall more anteriorly and  a working port more anteriorly was placed in a similar fashion under direct vision a 12 mm port with the scope and through the more anterior port.  The pleural effusion was removed.  A total of approximately 2 liters.  On examination of the parietal  pleural surface there were multiple tumor implants throughout the chest cavity.  In addition, they were also present on the visceral pleural surface and even in the fissure.  Multiple pleural biopsies were obtained.  Frozen section confirmed malignancy,  though no further delineation could be made.  Through the working port we then performed a talc pleurodesis initially with 4 g of talc.  However, with this some of the talc clogged in the tube and was not fully delivered.  A 2nd bottle of talc was then  used and better distribution of the talc was done.  Under direct vision, we placed a guidewire into  the chest.  A counter incision was made and the PleurX catheter was tunneled and introduced into the pleural space.  We then placed a 28 Blake drain  through the previous working port.  Removing this, the scope port was then closed with interrupted Vicryl suture in a subcuticular stitch.   Dermabond was applied.  The lung was reinflated.  Patient was extubated in the operating room and transferred to  the recovery room for postoperative care.  Sponge and needle count was reported as correct.  Blood loss was minimal.  There was noted approximately 2 liters of pleural fluid was removed.         CN/NUANCE  D:11/14/2019 T:11/14/2019 JOB:[id number]

## 2019-11-15 ENCOUNTER — Inpatient Hospital Stay (HOSPITAL_COMMUNITY): Payer: Medicare PPO

## 2019-11-15 ENCOUNTER — Encounter: Payer: Self-pay | Admitting: *Deleted

## 2019-11-15 ENCOUNTER — Other Ambulatory Visit: Payer: Self-pay | Admitting: Physician Assistant

## 2019-11-15 ENCOUNTER — Other Ambulatory Visit: Payer: Self-pay | Admitting: *Deleted

## 2019-11-15 DIAGNOSIS — Z09 Encounter for follow-up examination after completed treatment for conditions other than malignant neoplasm: Secondary | ICD-10-CM

## 2019-11-15 DIAGNOSIS — C3490 Malignant neoplasm of unspecified part of unspecified bronchus or lung: Secondary | ICD-10-CM

## 2019-11-15 LAB — CBC
HCT: 38.8 % — ABNORMAL LOW (ref 39.0–52.0)
Hemoglobin: 13.3 g/dL (ref 13.0–17.0)
MCH: 30.9 pg (ref 26.0–34.0)
MCHC: 34.3 g/dL (ref 30.0–36.0)
MCV: 90 fL (ref 80.0–100.0)
Platelets: 199 10*3/uL (ref 150–400)
RBC: 4.31 MIL/uL (ref 4.22–5.81)
RDW: 11.7 % (ref 11.5–15.5)
WBC: 10.5 10*3/uL (ref 4.0–10.5)
nRBC: 0 % (ref 0.0–0.2)

## 2019-11-15 LAB — COMPREHENSIVE METABOLIC PANEL
ALT: 12 U/L (ref 0–44)
AST: 12 U/L — ABNORMAL LOW (ref 15–41)
Albumin: 2.7 g/dL — ABNORMAL LOW (ref 3.5–5.0)
Alkaline Phosphatase: 55 U/L (ref 38–126)
Anion gap: 7 (ref 5–15)
BUN: 16 mg/dL (ref 8–23)
CO2: 26 mmol/L (ref 22–32)
Calcium: 8.1 mg/dL — ABNORMAL LOW (ref 8.9–10.3)
Chloride: 102 mmol/L (ref 98–111)
Creatinine, Ser: 0.9 mg/dL (ref 0.61–1.24)
GFR calc Af Amer: >60 mL/min (ref >60)
GFR calc non Af Amer: >60 mL/min (ref >60)
Glucose, Bld: 145 mg/dL — ABNORMAL HIGH (ref 70–99)
Potassium: 4.4 mmol/L (ref 3.5–5.1)
Sodium: 135 mmol/L (ref 135–145)
Total Bilirubin: 0.5 mg/dL (ref 0.3–1.2)
Total Protein: 5.7 g/dL — ABNORMAL LOW (ref 6.5–8.1)

## 2019-11-15 LAB — SURGICAL PATHOLOGY

## 2019-11-15 LAB — CYTOLOGY - NON PAP

## 2019-11-15 MED ORDER — TRAMADOL HCL 50 MG PO TABS: 50.0000 mg | ORAL_TABLET | Freq: Four times a day (QID) | ORAL | 0 refills | Status: DC | PRN

## 2019-11-15 MED ORDER — ACETAMINOPHEN 500 MG PO TABS: 1000.0000 mg | ORAL_TABLET | Freq: Four times a day (QID) | ORAL | 0 refills | Status: AC | PRN

## 2019-11-15 NOTE — TOC Transition Note (Signed)
Transition of Care [facility]) - CM/SW Discharge Note   Patient Details  Name: Jack Haynes MRN: [id number] Date of Birth: April 24, 1940  Transition of Care [facility]) CM/SW Contact:  Zenon Mayo, RN Phone Number: 11/15/2019, 10:05 AM   Clinical Narrative:    Patient is for dc home today, will go home with pluerx drains which has been ordered and box is in room.  NCM notified Tommi Rumps with [name] that patient is for dc today and drainage to start tomorrow and significant other has been educated also on how to do the drainage.  NCM faxed care fusion order form to [facility] on 9/29.   Final next level of care: [place] Barriers to Discharge: No Barriers Identified   Patient Goals and CMS Choice Patient states their goals for this hospitalization and ongoing recovery are:: get better CMS Medicare.gov Compare Post Acute Care list provided to:: Patient Represenative (must comment) Choice offered to / list presented to :  (girlfriend and patient)  Discharge Placement                       Discharge Plan and Services                  DME Agency: NA       HH Arranged: RN [place] Agency: [place] Date [place]: 11/13/19 Time HH Agency Contacted: 1640 Representative spoke with at [place]: [place] ([place]) Interventions     Readmission Risk Interventions No flowsheet data found.

## 2019-11-15 NOTE — Progress Notes (Signed)
Placed ordered for Imaging for Malignancy workup

## 2019-11-15 NOTE — Progress Notes (Signed)
I received referral from Dr. Servando Snare today.  Patient is currently at [facility].  I called patient's nurse and updated her on appt.  She will update patient.

## 2019-11-15 NOTE — Progress Notes (Signed)
Per Dr. Julien Nordmann, I notified pathology dept to send PDL 1 on recent bx. Case number [id number].

## 2019-11-15 NOTE — Discharge Summary (Signed)
Physician Discharge Summary  Patient ID: IZIK BINGMAN MRN: [id number] DOB/AGE: 1940/08/25 79 y.o.  Admit date: 11/13/2019 Discharge date: 11/15/2019  Admission Diagnoses:  Patient Active Problem List   Diagnosis Date Noted  . Atherosclerotic cardiovascular disease 10/29/2019  . Pleural effusion 10/29/2019  . History of prostate cancer 10/29/2019  . Essential hypertension 10/09/2019  . Abnormal EKG 01/02/2015  . Prostate cancer ([place]) 11/01/2011   Discharge Diagnoses:   Patient Active Problem List   Diagnosis Date Noted  . S/P Left VATS drainage of pleural effusion, pleural biopsy, placement of Pleur-x catheter 11/15/2019  . Atherosclerotic cardiovascular disease 10/29/2019  . Pleural effusion 10/29/2019  . History of prostate cancer 10/29/2019  . Essential hypertension 10/09/2019  . Abnormal EKG 01/02/2015  . Prostate cancer ([place]) 11/01/2011   Discharged Condition: good  History of Present Illness:  Mr. Malczewski is a 79 yo male referred to [facility] for evaluation of new onset large pleural effusion.  Patient recently presented to [facility] with complaints of increasing shortness of breath, especially with exertion and right sided chest discomfort.  He was admitted to the hospital and underwent Thoracentesis with successful removal of 2.5 L of fluid.  Cytology performed showed atypical mesothelial cells, but did not show malignancy.  Cardiac workup was negative. The patient was discharged home.  He was evaluated by Dr. Servando Snare on 11/12/2019.  At that time the patient continued to complain of similar symptoms.  His shortness of breath remained present and was worse with exertion.  He also experienced wheezing when he bends over.  The patient admitted to previous smoking history, but had quite more than 40 years ago.  He has a long history of working with agricultural pumps.  He does remember some asbestos being present.  He is vaccinated for COVID.  It was felt the patient should  undergo VATs procedure with drainage of pleural fluid and pleural biopsy for definitive diagnosis.  The risks and benefits of the procedure were explained to the patient and he was agreeable to proceed.  Hospital Course:   Mr. Chartrand presented to [facility] on 11/13/2019.  He was taken to the operating room and underwent Video Bronchoscopy, Video Assisted Thoracoscopy with drainage of pleural effusion, pleural biopsy, and placement of Pleur-x drainage catheter.  The patient tolerated the procedure without difficulty, was extubated and taken to the PACU in stable condition.  The patient has done well since surgery.  His chest tube had 625 cc since surgery.  The output improved and chest tube was removed on 11/15/2019.  Follow up CXR was stable in appearance.  Home health has been arranged for Pleur-x catheter drainage.  This is to begin tomorrow 11/16/2019.  Patient is ambulating independently.  His incisions are healing without evidence of infection.  He is medically stable for discharge home today.   PATHOLOGY:  SURGICAL PATHOLOGY  CASE: [id number]  PATIENT: Brantley Fling  Surgical Pathology Report   Clinical History: left pleural effusion (cm)   FINAL MICROSCOPIC DIAGNOSIS:   A. PLEURAL, LEFT, BIOPSY:  - Mesothelioma   B. PLEURAL, LEFT #2, BIOPSY:  - Mesothelioma   C. PLEURAL, LEFT #3, BIOPSY:  - Mesothelioma, see comment    Treatments: surgery:   Bronchoscopy, left video-assisted thoracoscopy with drainage of pleural effusion, multiple pleural biopsies, talc pleurodesis, placement of PleurX catheter, and chest tube.  Discharge Exam: Blood pressure 104/73, pulse 81, temperature 98.2 F (36.8 C), temperature source Oral, resp. rate 19, SpO2 93 %.   General  appearance: alert, cooperative and no distress Heart: regular rate and rhythm Lungs: diminished breath sounds bibasilar Abdomen: soft, non-tender; bowel sounds normal; no masses,  no organomegaly Extremities:  extremities normal, atraumatic, no cyanosis or edema Wound: clean and dry    Allergies as of 11/15/2019   No Known Allergies     Medication List    TAKE these medications   acetaminophen 500 MG tablet Commonly known as: TYLENOL Take 2 tablets (1,000 mg total) by mouth every 6 (six) hours as needed for mild pain or fever.   aspirin EC 81 MG tablet Take 81 mg by mouth See admin instructions. Take one tablet (81 mg) by mouth twice a week at night   carboxymethylcellulose 0.5 % Soln Commonly known as: REFRESH PLUS Place 1 drop into the left eye daily as needed (dry eyes/irritation).   lisinopril 20 MG tablet Commonly known as: ZESTRIL Take 1 tablet (20 mg total) by mouth daily.   naproxen sodium 220 MG tablet Commonly known as: ALEVE Take 220 mg by mouth daily as needed (pain/headache).   NEURIVA PO Take 1 capsule by mouth daily.   rosuvastatin 10 MG tablet Commonly known as: Crestor Take 1 tablet (10 mg total) by mouth daily.   traMADol 50 MG tablet Commonly known as: ULTRAM Take 1-2 tablets (50-100 mg total) by mouth every 6 (six) hours as needed (mild pain).       Follow-up Information    [facility] Follow up.   Specialty: Home Health Services Why: HHRN. Daily Pleurix drainage to start tomorrow 11/16/2019 If <150 ml Charlsie Quest session x3 consecutive occasions - QOD drainage If <150 ml /drainage session x3 consecutive occasions on  QOD drainage  Please keep record of output and bring to office appointme Contact information: [address] [place] [address] [phone]               Signed:  Ellwood Handler, PA-C 11/15/2019, 1:48 PM

## 2019-11-15 NOTE — Progress Notes (Addendum)
      [address]       [place] [address]             [phone]      2 Days Post-Op Procedure(s) (LRB): VIDEO BRONCHOSCOPY (N/A) VIDEO ASSISTED THORACOSCOPY (Left) PLEURAL BIOPSY (Left) INSERTION PLEURAL DRAINAGE CATHETER (Left) possible TALC PLEURADESIS (Left)   Subjective:  No new complaints.  The patient is up eating breakfast.  Theyre hoping pathology results will be back today.  Objective: Vital signs in last 24 hours: Temp:  [97.8 F (36.6 C)-98.4 F (36.9 C)] 98.4 F (36.9 C) (09/30 0424) Pulse Rate:  [83-85] 83 (09/30 0424) Cardiac Rhythm: Normal sinus rhythm;Ventricular tachycardia (09/30 0525) Resp:  [17-21] 17 (09/30 0613) BP: (118-126)/(71-81) 118/73 (09/30 0424) SpO2:  [94 %-96 %] 94 % (09/30 0424)  Intake/Output from previous day: 09/29 0701 - 09/30 0700 In: 240 [P.O.:240] Out: 195 [Chest Tube:195]  General appearance: alert, cooperative and no distress Heart: regular rate and rhythm Lungs: diminished breath sounds bibasilar Abdomen: soft, non-tender; bowel sounds normal; no masses,  no organomegaly Extremities: extremities normal, atraumatic, no cyanosis or edema Wound: clean and dry  Lab Results: Recent Labs    11/14/19 0235 11/15/19 0208  WBC 13.3* 10.5  HGB 13.8 13.3  HCT 40.9 38.8*  PLT 207 199   BMET:  Recent Labs    11/14/19 0235 11/15/19 0208  NA 133* 135  K 5.1 4.4  CL 102 102  CO2 22 26  GLUCOSE 191* 145*  BUN 15 16  CREATININE 0.98 0.90  CALCIUM 8.6* 8.1*    PT/INR:  Recent Labs    11/13/19 0716  LABPROT 13.2  INR 1.0   ABG    Component Value Date/Time   PHART 7.403 11/13/2019 0720   HCO3 24.7 11/13/2019 0720   O2SAT 95.8 11/13/2019 0720   CBG (last 3)  No results for input(s): GLUCAP in the last 72 hours.  Assessment/Plan: S/P Procedure(s) (LRB): VIDEO BRONCHOSCOPY (N/A) VIDEO ASSISTED THORACOSCOPY (Left) PLEURAL BIOPSY (Left) INSERTION PLEURAL DRAINAGE CATHETER (Left) possible  TALC PLEURADESIS (Left)  1. CV- NSR, BP stable- continue home Lisinopril 2. Pulm- CT output decreased, CXR with improvement of previous pleural effusion, will d/c chest tube, plan to start draining Pleur-x catheter tomorrow 3. Pathology- remains pending 4. Dispo- patient stable, plan to d/c chest tube today, start Pleur-x drainage tomorrow, H/H is arranged, will d/c home today.   LOS: 2 days    Ellwood Handler, PA-C 11/15/2019  Plan home today outpatient pet and mri Discussed plans with patient for drainage and follow up scans and oncology visits I have seen and examined Jack Haynes and agree with the above assessment  and plan.  Grace Isaac MD Beeper [phone] Office [phone] 11/15/2019 8:40 AM

## 2019-11-15 NOTE — Plan of Care (Signed)

## 2019-11-15 NOTE — Progress Notes (Signed)
The proposed treatment discussed in cancer conference 11/15/19 is for discussion purpose only and is not a binding recommendation.  The patient was not physically examined nor present with their treatment options.  Therefore, final treatment plans cannot be decided.

## 2019-11-15 NOTE — Discharge Instructions (Signed)
Daily Pleurix drainage to start tomorrow 11/16/2019 If <150 ml /drainage session x3 consecutive occasions - QOD drainage If <150 ml /drainage session x3 consecutive occasions on  QOD drainage schedule- call [facility] office  [phone]) for evaluation and possible removal   Video-Assisted Thoracic Surgery, Care After This sheet gives you information about how to care for yourself after your procedure. Your health care provider may also give you more specific instructions. If you have problems or questions, contact your health care provider. What can I expect after the procedure? After the procedure, it is common to have:  Some pain and soreness in your chest.  Pain when breathing in (inhaling) and coughing.  Constipation.  Fatigue.  Difficulty sleeping. Follow these instructions at home: Preventing pneumonia  Take deep breaths or do breathing exercises as instructed by your health care provider. Doing this helps prevent lung infection (pneumonia).  Cough frequently. Coughing may cause discomfort, but it is important to clear mucus (phlegm) and expand your lungs. If it hurts to cough, hold a pillow against your chest or place the palms of both hands on top of the incision (use splinting) when you cough. This may help relieve discomfort.  If you were given an incentive spirometer, use it as directed. An incentive spirometer is a tool that measures how well you are filling your lungs with each breath.  Participate in pulmonary rehabilitation as directed by your health care provider. This is a program that combines education, exercise, and support from a team of specialists. The goal is to help you heal and get back to your normal activities as soon as possible. Medicines  Take over-the-counter or prescription medicines only as told by your health care provider.  If you have pain, take pain-relieving medicine before your pain becomes severe. This is important because if your pain is under  control, you will be able to breathe and cough more comfortably.  If you were prescribed an antibiotic medicine, take it as told by your health care provider. Do not stop taking the antibiotic even if you start to feel better. Activity  Ask your health care provider what activities are safe for you.  Avoid activities that use your chest muscles for at least 3-4 weeks.  Do not lift anything that is heavier than 10 lb (4.5 kg), or the limit that your health care provider tells you, until he or she says that it is safe. Incision care  Follow instructions from your health care provider about how to take care of your incision(s). Make sure you: ? Wash your hands with soap and water before you change your bandage (dressing). If soap and water are not available, use hand sanitizer. ? Change your dressing as told by your health care provider. ? Leave stitches (sutures), skin glue, or adhesive strips in place. These skin closures may need to stay in place for 2 weeks or longer. If adhesive strip edges start to loosen and curl up, you may trim the loose edges. Do not remove adhesive strips completely unless your health care provider tells you to do that.  Keep your dressing dry until it has been removed.  Check your incision area every day for signs of infection. Check for: ? Redness, swelling, or pain. ? Fluid or blood. ? Warmth. ? Pus or a bad smell. Bathing  Do not take baths, swim, or use a hot tub until your health care provider approves. You may take showers.  After your dressing has been removed, use soap  and water to gently wash your incision area. Do not use anything else to clean your incision(s) unless your health care provider tells you to do this. Driving   Do not drive until your health care provider approves.  Do not drive or use heavy machinery while taking prescription pain medicine. Eating and drinking  Eat a healthy, balanced diet as instructed by your health care  provider. A healthy diet includes plenty of fresh fruits and vegetables, whole grains, and low-fat (lean) proteins.  Limit foods that are high in fat and processed sugars, such as fried and sweet foods.  Drink enough fluid to keep your urine clear or pale yellow. General instructions   To prevent or treat constipation while you are taking prescription pain medicine, your health care provider may recommend that you: ? Take over-the-counter or prescription medicines. ? Eat foods that are high in fiber, such as beans, fresh fruits and vegetables, and whole grains.  Do not use any products that contain nicotine or tobacco, such as cigarettes and e-cigarettes. If you need help quitting, ask your health care provider.  Avoid secondhand smoke.  Wear compression stockings as told by your health care provider. These stockings help to prevent blood clots and reduce swelling in your legs.  If you have a chest tube, care for it as instructed by your health care provider. Do not travel by airplane during the 2 weeks after your chest tube is removed, or until your health care provider says that this is safe.  Keep all follow-up visits as told by your health care provider. This is important. Contact a health care provider if:  You have redness, swelling, or pain around an incision.  You have fluid or blood coming from an incision.  Your incision area feels warm to the touch.  You have pus or a bad smell coming from an incision.  You have a fever or chills.  You have nausea or vomiting.  You have pain that does not get better with medicine. Get help right away if:  You have chest pain.  Your heart is fluttering or beating rapidly.  You develop a rash.  You have shortness of breath or trouble breathing.  You are confused.  You have trouble speaking.  You feel weak, light-headed, or dizzy.  You faint. Summary  To help prevent lung infection (pneumonia), take deep breaths or do  breathing exercises as instructed by your health care provider.  Cough frequently to clear mucus (phlegm) and expand your lungs. If it hurts to cough, hold a pillow against your chest or place the palms of both hands on top of the incision (use splinting) when you cough.  If you have pain, take pain-relieving medicine before your pain becomes severe. This is important because if your pain is under control, you will be able to breathe and cough more comfortably.  Ask your health care provider what activities are safe for you. This information is not intended to replace advice given to you by your health care provider. Make sure you discuss any questions you have with your health care provider. Document Revised: 01/14/2017 Document Reviewed: 01/12/2016 Elsevier Patient Education  2020 [employer].

## 2019-11-16 ENCOUNTER — Telehealth: Payer: Self-pay | Admitting: Emergency Medicine

## 2019-11-16 DIAGNOSIS — Z48813 Encounter for surgical aftercare following surgery on the respiratory system: Secondary | ICD-10-CM | POA: Diagnosis not present

## 2019-11-16 DIAGNOSIS — Z4801 Encounter for change or removal of surgical wound dressing: Secondary | ICD-10-CM | POA: Diagnosis not present

## 2019-11-16 DIAGNOSIS — I2510 Atherosclerotic heart disease of native coronary artery without angina pectoris: Secondary | ICD-10-CM | POA: Diagnosis not present

## 2019-11-16 DIAGNOSIS — C3492 Malignant neoplasm of unspecified part of left bronchus or lung: Secondary | ICD-10-CM | POA: Diagnosis not present

## 2019-11-16 DIAGNOSIS — Z483 Aftercare following surgery for neoplasm: Secondary | ICD-10-CM | POA: Diagnosis not present

## 2019-11-16 DIAGNOSIS — J90 Pleural effusion, not elsewhere classified: Secondary | ICD-10-CM | POA: Diagnosis not present

## 2019-11-16 DIAGNOSIS — R634 Abnormal weight loss: Secondary | ICD-10-CM | POA: Diagnosis not present

## 2019-11-16 DIAGNOSIS — Z6830 Body mass index (BMI) 30.0-30.9, adult: Secondary | ICD-10-CM | POA: Diagnosis not present

## 2019-11-16 DIAGNOSIS — I119 Hypertensive heart disease without heart failure: Secondary | ICD-10-CM | POA: Diagnosis not present

## 2019-11-16 NOTE — Telephone Encounter (Unsigned)
Copied from [place] [id number]. Topic: Quick Communication - Home Health Verbal Orders >> Nov 16, 2019  4:38 PM Yvette Rack wrote: Caller/Agency: Diane with [facility] Number: [phone] Requesting OT/PT/Skilled Nursing/Social Work/Speech Therapy: skilled nursing for pleurx catheter Frequency: 1 time a week for 4 weeks  Diane reports patient heart rate was 125 today

## 2019-11-17 DIAGNOSIS — Z483 Aftercare following surgery for neoplasm: Secondary | ICD-10-CM | POA: Diagnosis not present

## 2019-11-17 DIAGNOSIS — R634 Abnormal weight loss: Secondary | ICD-10-CM | POA: Diagnosis not present

## 2019-11-17 DIAGNOSIS — Z6830 Body mass index (BMI) 30.0-30.9, adult: Secondary | ICD-10-CM | POA: Diagnosis not present

## 2019-11-17 DIAGNOSIS — Z48813 Encounter for surgical aftercare following surgery on the respiratory system: Secondary | ICD-10-CM | POA: Diagnosis not present

## 2019-11-17 DIAGNOSIS — I2510 Atherosclerotic heart disease of native coronary artery without angina pectoris: Secondary | ICD-10-CM | POA: Diagnosis not present

## 2019-11-17 DIAGNOSIS — J90 Pleural effusion, not elsewhere classified: Secondary | ICD-10-CM | POA: Diagnosis not present

## 2019-11-17 DIAGNOSIS — I119 Hypertensive heart disease without heart failure: Secondary | ICD-10-CM | POA: Diagnosis not present

## 2019-11-17 DIAGNOSIS — C3492 Malignant neoplasm of unspecified part of left bronchus or lung: Secondary | ICD-10-CM | POA: Diagnosis not present

## 2019-11-17 DIAGNOSIS — Z4801 Encounter for change or removal of surgical wound dressing: Secondary | ICD-10-CM | POA: Diagnosis not present

## 2019-11-19 NOTE — Telephone Encounter (Signed)
Pt had a 124 BPM   Bp  113/74 [facility] wants to know if this is okay they weren't expecting such a high HR

## 2019-11-19 NOTE — Telephone Encounter (Signed)
Obtained by home health after pts procedure at the hospital 11/13/2019 was within the few days following, pt was seated and had been a few minutes before they had taken vitals

## 2019-11-19 NOTE — Telephone Encounter (Signed)
Depends on what the circumstances are.

## 2019-11-19 NOTE — Telephone Encounter (Signed)
LM for [facility] to call us back

## 2019-11-20 DIAGNOSIS — C3490 Malignant neoplasm of unspecified part of unspecified bronchus or lung: Secondary | ICD-10-CM | POA: Diagnosis not present

## 2019-11-20 DIAGNOSIS — Z483 Aftercare following surgery for neoplasm: Secondary | ICD-10-CM | POA: Diagnosis not present

## 2019-11-20 DIAGNOSIS — I2510 Atherosclerotic heart disease of native coronary artery without angina pectoris: Secondary | ICD-10-CM | POA: Diagnosis not present

## 2019-11-20 DIAGNOSIS — I119 Hypertensive heart disease without heart failure: Secondary | ICD-10-CM | POA: Diagnosis not present

## 2019-11-20 DIAGNOSIS — J910 Malignant pleural effusion: Secondary | ICD-10-CM | POA: Diagnosis not present

## 2019-11-20 DIAGNOSIS — Z6830 Body mass index (BMI) 30.0-30.9, adult: Secondary | ICD-10-CM | POA: Diagnosis not present

## 2019-11-20 DIAGNOSIS — J90 Pleural effusion, not elsewhere classified: Secondary | ICD-10-CM | POA: Diagnosis not present

## 2019-11-20 DIAGNOSIS — Z4801 Encounter for change or removal of surgical wound dressing: Secondary | ICD-10-CM | POA: Diagnosis not present

## 2019-11-20 DIAGNOSIS — Z48813 Encounter for surgical aftercare following surgery on the respiratory system: Secondary | ICD-10-CM | POA: Diagnosis not present

## 2019-11-20 DIAGNOSIS — R634 Abnormal weight loss: Secondary | ICD-10-CM | POA: Diagnosis not present

## 2019-11-20 DIAGNOSIS — C3492 Malignant neoplasm of unspecified part of left bronchus or lung: Secondary | ICD-10-CM | POA: Diagnosis not present

## 2019-11-23 ENCOUNTER — Encounter: Payer: Self-pay | Admitting: Internal Medicine

## 2019-11-23 ENCOUNTER — Other Ambulatory Visit: Payer: Self-pay | Admitting: Physician Assistant

## 2019-11-23 ENCOUNTER — Other Ambulatory Visit: Payer: Self-pay

## 2019-11-23 ENCOUNTER — Inpatient Hospital Stay: Payer: Medicare PPO | Attending: Internal Medicine | Admitting: Internal Medicine

## 2019-11-23 ENCOUNTER — Telehealth: Payer: Self-pay | Admitting: Internal Medicine

## 2019-11-23 ENCOUNTER — Encounter (HOSPITAL_COMMUNITY): Payer: Self-pay | Admitting: Cardiothoracic Surgery

## 2019-11-23 ENCOUNTER — Other Ambulatory Visit: Payer: Self-pay | Admitting: Internal Medicine

## 2019-11-23 ENCOUNTER — Inpatient Hospital Stay: Payer: Medicare PPO

## 2019-11-23 ENCOUNTER — Other Ambulatory Visit: Payer: Self-pay | Admitting: *Deleted

## 2019-11-23 VITALS — BP 148/93 | HR 86 | Temp 99.9°F | Resp 15 | Ht 67.0 in | Wt 185.6 lb

## 2019-11-23 DIAGNOSIS — Z23 Encounter for immunization: Secondary | ICD-10-CM | POA: Diagnosis not present

## 2019-11-23 DIAGNOSIS — Z87891 Personal history of nicotine dependence: Secondary | ICD-10-CM | POA: Diagnosis not present

## 2019-11-23 DIAGNOSIS — C459 Mesothelioma, unspecified: Secondary | ICD-10-CM | POA: Diagnosis not present

## 2019-11-23 DIAGNOSIS — J90 Pleural effusion, not elsewhere classified: Secondary | ICD-10-CM

## 2019-11-23 DIAGNOSIS — Z8546 Personal history of malignant neoplasm of prostate: Secondary | ICD-10-CM | POA: Insufficient documentation

## 2019-11-23 DIAGNOSIS — Z7952 Long term (current) use of systemic steroids: Secondary | ICD-10-CM | POA: Insufficient documentation

## 2019-11-23 DIAGNOSIS — C457 Mesothelioma of other sites: Secondary | ICD-10-CM

## 2019-11-23 DIAGNOSIS — I6782 Cerebral ischemia: Secondary | ICD-10-CM | POA: Diagnosis not present

## 2019-11-23 DIAGNOSIS — M47814 Spondylosis without myelopathy or radiculopathy, thoracic region: Secondary | ICD-10-CM | POA: Insufficient documentation

## 2019-11-23 DIAGNOSIS — I119 Hypertensive heart disease without heart failure: Secondary | ICD-10-CM | POA: Insufficient documentation

## 2019-11-23 DIAGNOSIS — C450 Mesothelioma of pleura: Secondary | ICD-10-CM

## 2019-11-23 DIAGNOSIS — Z5111 Encounter for antineoplastic chemotherapy: Secondary | ICD-10-CM | POA: Diagnosis not present

## 2019-11-23 DIAGNOSIS — Z809 Family history of malignant neoplasm, unspecified: Secondary | ICD-10-CM | POA: Insufficient documentation

## 2019-11-23 DIAGNOSIS — Z7189 Other specified counseling: Secondary | ICD-10-CM | POA: Diagnosis not present

## 2019-11-23 LAB — CBC WITH DIFFERENTIAL (CANCER CENTER ONLY)
Abs Immature Granulocytes: 0.09 10*3/uL — ABNORMAL HIGH (ref 0.00–0.07)
Basophils Absolute: 0 10*3/uL (ref 0.0–0.1)
Basophils Relative: 0 %
Eosinophils Absolute: 0.1 10*3/uL (ref 0.0–0.5)
Eosinophils Relative: 2 %
HCT: 43.5 % (ref 39.0–52.0)
Hemoglobin: 14.8 g/dL (ref 13.0–17.0)
Immature Granulocytes: 1 %
Lymphocytes Relative: 21 %
Lymphs Abs: 1.5 10*3/uL (ref 0.7–4.0)
MCH: 30.3 pg (ref 26.0–34.0)
MCHC: 34 g/dL (ref 30.0–36.0)
MCV: 89 fL (ref 80.0–100.0)
Monocytes Absolute: 0.8 10*3/uL (ref 0.1–1.0)
Monocytes Relative: 11 %
Neutro Abs: 4.7 10*3/uL (ref 1.7–7.7)
Neutrophils Relative %: 65 %
Platelet Count: 244 10*3/uL (ref 150–400)
RBC: 4.89 MIL/uL (ref 4.22–5.81)
RDW: 11.2 % — ABNORMAL LOW (ref 11.5–15.5)
WBC Count: 7.2 10*3/uL (ref 4.0–10.5)
nRBC: 0 % (ref 0.0–0.2)

## 2019-11-23 LAB — CMP (CANCER CENTER ONLY)
ALT: 55 U/L — ABNORMAL HIGH (ref 0–44)
AST: 22 U/L (ref 15–41)
Albumin: 3.2 g/dL — ABNORMAL LOW (ref 3.5–5.0)
Alkaline Phosphatase: 86 U/L (ref 38–126)
Anion gap: 5 (ref 5–15)
BUN: 13 mg/dL (ref 8–23)
CO2: 30 mmol/L (ref 22–32)
Calcium: 9.7 mg/dL (ref 8.9–10.3)
Chloride: 100 mmol/L (ref 98–111)
Creatinine: 1.02 mg/dL (ref 0.61–1.24)
GFR, Estimated: >60 mL/min (ref >60)
Glucose, Bld: 136 mg/dL — ABNORMAL HIGH (ref 70–99)
Potassium: 4.1 mmol/L (ref 3.5–5.1)
Sodium: 135 mmol/L (ref 135–145)
Total Bilirubin: 0.4 mg/dL (ref 0.3–1.2)
Total Protein: 7.5 g/dL (ref 6.5–8.1)

## 2019-11-23 MED ORDER — INFLUENZA VAC A&B SA ADJ QUAD 0.5 ML IM PRSY
0.5000 mL | PREFILLED_SYRINGE | Freq: Once | INTRAMUSCULAR | Status: AC
  Administered 2019-11-23: 0.5 mL via INTRAMUSCULAR

## 2019-11-23 MED ORDER — INFLUENZA VAC A&B SA ADJ QUAD 0.5 ML IM PRSY
PREFILLED_SYRINGE | INTRAMUSCULAR | Status: AC
  Filled 2019-11-23: qty 0.5

## 2019-11-23 MED ORDER — CYANOCOBALAMIN 1000 MCG/ML IJ SOLN
1000.0000 ug | Freq: Once | INTRAMUSCULAR | Status: AC
  Administered 2019-11-23: 1000 ug via INTRAMUSCULAR

## 2019-11-23 MED ORDER — LIDOCAINE-PRILOCAINE 2.5-2.5 % EX CREA: TOPICAL_CREAM | CUTANEOUS | 0 refills | Status: DC

## 2019-11-23 MED ORDER — FOLIC ACID 1 MG PO TABS: 1.0000 mg | ORAL_TABLET | Freq: Every day | ORAL | 4 refills | Status: DC

## 2019-11-23 MED ORDER — DEXAMETHASONE 4 MG PO TABS: ORAL_TABLET | ORAL | 1 refills | Status: DC

## 2019-11-23 MED ORDER — PROCHLORPERAZINE MALEATE 10 MG PO TABS: 10.0000 mg | ORAL_TABLET | Freq: Four times a day (QID) | ORAL | 0 refills | Status: DC | PRN

## 2019-11-23 MED ORDER — CYANOCOBALAMIN 1000 MCG/ML IJ SOLN
INTRAMUSCULAR | Status: AC
  Filled 2019-11-23: qty 1

## 2019-11-23 NOTE — Patient Instructions (Signed)
Pemetrexed injection What is this medicine? PEMETREXED (PEM e TREX ed) is a chemotherapy drug used to treat lung cancers like non-small cell lung cancer and mesothelioma. It may also be used to treat other cancers. This medicine may be used for other purposes; ask your health care provider or pharmacist if you have questions. COMMON BRAND NAME(S): Alimta What should I tell my health care provider before I take this medicine? They need to know if you have any of these conditions:  infection (especially a virus infection such as chickenpox, cold sores, or herpes)  kidney disease  low blood counts, like low white cell, platelet, or red cell counts  lung or breathing disease, like asthma  radiation therapy  an unusual or allergic reaction to pemetrexed, other medicines, foods, dyes, or preservative  pregnant or trying to get pregnant  breast-feeding How should I use this medicine? This drug is given as an infusion into a vein. It is administered in a hospital or clinic by a specially trained health care professional. Talk to your pediatrician regarding the use of this medicine in children. Special care may be needed. Overdosage: If you think you have taken too much of this medicine contact a poison control center or emergency room at once. NOTE: This medicine is only for you. Do not share this medicine with others. What if I miss a dose? It is important not to miss your dose. Call your doctor or health care professional if you are unable to keep an appointment. What may interact with this medicine? This medicine may interact with the following medications:  Ibuprofen This list may not describe all possible interactions. Give your health care provider a list of all the medicines, herbs, non-prescription drugs, or dietary supplements you use. Also tell them if you smoke, drink alcohol, or use illegal drugs. Some items may interact with your medicine. What should I watch for while using  this medicine? Visit your doctor for checks on your progress. This drug may make you feel generally unwell. This is not uncommon, as chemotherapy can affect healthy cells as well as cancer cells. Report any side effects. Continue your course of treatment even though you feel ill unless your doctor tells you to stop. In some cases, you may be given additional medicines to help with side effects. Follow all directions for their use. Call your doctor or health care professional for advice if you get a fever, chills or sore throat, or other symptoms of a cold or flu. Do not treat yourself. This drug decreases your body's ability to fight infections. Try to avoid being around people who are sick. This medicine may increase your risk to bruise or bleed. Call your doctor or health care professional if you notice any unusual bleeding. Be careful brushing and flossing your teeth or using a toothpick because you may get an infection or bleed more easily. If you have any dental work done, tell your dentist you are receiving this medicine. Avoid taking products that contain aspirin, acetaminophen, ibuprofen, naproxen, or ketoprofen unless instructed by your doctor. These medicines may hide a fever. Call your doctor or health care professional if you get diarrhea or mouth sores. Do not treat yourself. To protect your kidneys, drink water or other fluids as directed while you are taking this medicine. Do not become pregnant while taking this medicine or for 6 months after stopping it. Women should inform their doctor if they wish to become pregnant or think they might be pregnant. Men should  not father a child while taking this medicine and for 3 months after stopping it. This may interfere with the ability to father a child. You should talk to your doctor or health care professional if you are concerned about your fertility. There is a potential for serious side effects to an unborn child. Talk to your health care  professional or pharmacist for more information. Do not breast-feed an infant while taking this medicine or for 1 week after stopping it. What side effects may I notice from receiving this medicine? Side effects that you should report to your doctor or health care professional as soon as possible:  allergic reactions like skin rash, itching or hives, swelling of the face, lips, or tongue  breathing problems  redness, blistering, peeling or loosening of the skin, including inside the mouth  signs and symptoms of bleeding such as bloody or black, tarry stools; red or dark-brown urine; spitting up blood or brown material that looks like coffee grounds; red spots on the skin; unusual bruising or bleeding from the eye, gums, or nose  signs and symptoms of infection like fever or chills; cough; sore throat; pain or trouble passing urine  signs and symptoms of kidney injury like trouble passing urine or change in the amount of urine  signs and symptoms of liver injury like dark yellow or brown urine; general ill feeling or flu-like symptoms; light-colored stools; loss of appetite; nausea; right upper belly pain; unusually weak or tired; yellowing of the eyes or skin Side effects that usually do not require medical attention (report to your doctor or health care professional if they continue or are bothersome):  constipation  mouth sores  nausea, vomiting  unusually weak or tired This list may not describe all possible side effects. Call your doctor for medical advice about side effects. You may report side effects to FDA at 1-800-FDA-1088. Where should I keep my medicine? This drug is given in a hospital or clinic and will not be stored at home. NOTE: This sheet is a summary. It may not cover all possible information. If you have questions about this medicine, talk to your doctor, pharmacist, or health care provider.  2020 Elsevier/Gold Standard (2017-03-23 16:11:33)

## 2019-11-23 NOTE — Progress Notes (Signed)
[place] [phone]   [phone]  CONSULT NOTE  REFERRING PHYSICIAN: Dr. Lanelle Bal  REASON FOR CONSULTATION:  79 years old white male recently diagnosed with malignant pleural mesothelioma  HPI Jack Haynes is a 79 y.o. male with past medical history significant for hypertension, osteoarthritis as well as history of prostate cancer as well as history of smoking but quit in 1983.  The patient mentioned that he has been complaining of progressive shortness of breath for a couple of weeks.  He was seen by his primary care physician and chest x-ray performed on 10/29/2019 showed large left pleural effusion.  He was sent to the emergency department at [facility] and he underwent ultrasound guided left thoracentesis with drainage of 2.5 L of pleural fluid.  He had CT scan of the chest with contrast on 10/30/2019 and it showed moderate left pleural effusion with features of loculation, circumferential pleural thickening and multifocal pleural nodularity.  The findings raise suspicious for a malignant or metastatic pleural process in the left lung with associated pleural effusion.  The patient was referred to Dr. Servando Snare and on 11/13/2019 he underwent bronchoscopy, left VATS with drainage of pleural effusion, multiple pleural biopsies, talc pleurodesis and placement of Pleurx catheter and chest tube.  The final pathology ([id number]) of the left pleural biopsies were consistent with mesothelioma. Immunohistochemical stains for calretinin and D2-40 are positive  in the tumor cells, consistent with above interpretation. Immunostain for Ki-67 shows an increased proliferative index.  The patient was referred to me today for evaluation and recommendation regarding treatment of his condition. When seen today he is feeling fine and mentions that he is around 98% of his status before the diagnosis.  He has mild cough but no significant chest pain,  shortness of breath or hemoptysis.  He denied having any nausea, vomiting, diarrhea or constipation.  He has no headache or visual changes.  He has no recent weight loss or night sweats. Family history significant for father with Alzheimer, brother had heart disease, sister had brain cancer and mother died from old age. The patient is single and has 2 sons Jack Haynes and Jack Haynes.  He used to work in [employer].  He was accompanied today by a friend [name].  He has a history of smoking but quit 4 years ago.  He drinks around 3 times a week and no history of drug abuse.  HPI  Past Medical History:  Diagnosis Date   Arthritis    Cancer ([place])    hx of prostatae cancer   Hypertension     Past Surgical History:  Procedure Laterality Date   APPENDECTOMY     BACK SURGERY  1998   CHEST TUBE INSERTION Left 11/13/2019   Procedure: INSERTION PLEURAL DRAINAGE CATHETER;  Surgeon: Grace Isaac, MD;  Location: [facility];  Service: Thoracic;  Laterality: Left;   EYE SURGERY Left 1965   JOINT REPLACEMENT Left 2010   left knee replacement   KNEE SURGERY     PLEURAL BIOPSY Left 11/13/2019   Procedure: PLEURAL BIOPSY;  Surgeon: Grace Isaac, MD;  Location: [place];  Service: Thoracic;  Laterality: Left;   PROSTATE SURGERY     SPINE SURGERY     TALC PLEURODESIS Left 11/13/2019   Procedure: possible TALC PLEURADESIS;  Surgeon: Grace Isaac, MD;  Location: [place];  Service: Thoracic;  Laterality: Left;   TOTAL KNEE ARTHROPLASTY  02/01/2012   Procedure: TOTAL KNEE  ARTHROPLASTY;  Surgeon: Sydnee Cabal, MD;  Location: [facility];  Service: Orthopedics;  Laterality: Left;   VIDEO ASSISTED THORACOSCOPY Left 11/13/2019   Procedure: VIDEO ASSISTED THORACOSCOPY;  Surgeon: Grace Isaac, MD;  Location: [facility] OR;  Service: Thoracic;  Laterality: Left;   VIDEO BRONCHOSCOPY N/A 11/13/2019   Procedure: VIDEO BRONCHOSCOPY;  Surgeon: Grace Isaac, MD;  Location: [facility] OR;   Service: Thoracic;  Laterality: N/A;    Family History  Problem Relation Age of Onset   Cancer Sister        brain tumor   Heart disease Brother    Alzheimer's disease Brother    Heart disease Sister    Alzheimer's disease Father    Hypertension Sister    Colon cancer Neg Hx    Colon polyps Neg Hx    Esophageal cancer Neg Hx    Rectal cancer Neg Hx    Stomach cancer Neg Hx     Social History Social History   Tobacco Use   Smoking status: Former Smoker    Quit date: 02/15/1981    Years since quitting: 38.7   Smokeless tobacco: Current User    Types: Chew  Vaping Use   Vaping Use: Never used  Substance Use Topics   Alcohol use: Yes    Alcohol/week: 5.0 standard drinks    Types: 5 Shots of liquor per week    Comment:  beer or mixed drink - moderate drink about 3 drinks a week   Drug use: No    No Known Allergies  Current Outpatient Medications  Medication Sig Dispense Refill   acetaminophen (TYLENOL) 500 MG tablet Take 2 tablets (1,000 mg total) by mouth every 6 (six) hours as needed for mild pain or fever. 30 tablet 0   aspirin EC 81 MG tablet Take 81 mg by mouth See admin instructions. Take one tablet (81 mg) by mouth twice a week at night     carboxymethylcellulose (REFRESH PLUS) 0.5 % SOLN Place 1 drop into the left eye daily as needed (dry eyes/irritation).      lisinopril (ZESTRIL) 20 MG tablet Take 1 tablet (20 mg total) by mouth daily. 90 tablet 3   Misc Natural Products (NEURIVA PO) Take 1 capsule by mouth daily.      naproxen sodium (ALEVE) 220 MG tablet Take 220 mg by mouth daily as needed (pain/headache).     rosuvastatin (CRESTOR) 10 MG tablet Take 1 tablet (10 mg total) by mouth daily. 90 tablet 3   traMADol (ULTRAM) 50 MG tablet Take 1-2 tablets (50-100 mg total) by mouth every 6 (six) hours as needed (mild pain). 30 tablet 0   No current facility-administered medications for this visit.    Review of Systems  Constitutional:  positive for fatigue Eyes: negative Ears, nose, mouth, throat, and face: negative Respiratory: positive for cough Cardiovascular: negative Gastrointestinal: negative Genitourinary:negative Integument/breast: negative Hematologic/lymphatic: negative Musculoskeletal:negative Neurological: negative Behavioral/Psych: negative Endocrine: negative Allergic/Immunologic: negative  Physical Exam  AXK:PVVZS, healthy, no distress, well nourished and well developed SKIN: skin color, texture, turgor are normal, no rashes or significant lesions HEAD: Normocephalic, No masses, lesions, tenderness or abnormalities EYES: normal, PERRLA, Conjunctiva are pink and non-injected EARS: External ears normal, Canals clear OROPHARYNX:no exudate, no erythema and lips, buccal mucosa, and tongue normal  NECK: supple, no adenopathy, no JVD LYMPH:  no palpable lymphadenopathy, no hepatosplenomegaly LUNGS: clear to auscultation , and palpation HEART: regular rate & rhythm, no murmurs and no gallops ABDOMEN:abdomen soft, non-tender, normal bowel  sounds and no masses or organomegaly BACK: No CVA tenderness, Range of motion is normal EXTREMITIES:no joint deformities, effusion, or inflammation, no edema  NEURO: alert & oriented x 3 with fluent speech, no focal motor/sensory deficits  PERFORMANCE STATUS: ECOG 1  LABORATORY DATA: Lab Results  Component Value Date   WBC 7.2 11/23/2019   HGB 14.8 11/23/2019   HCT 43.5 11/23/2019   MCV 89.0 11/23/2019   PLT 244 11/23/2019      Chemistry      Component Value Date/Time   NA 135 11/15/2019 0208   NA 141 09/24/2019 0832   K 4.4 11/15/2019 0208   CL 102 11/15/2019 0208   CO2 26 11/15/2019 0208   BUN 16 11/15/2019 0208   BUN 17 09/24/2019 0832   CREATININE 0.90 11/15/2019 0208   CREATININE 1.01 12/24/2014 0916      Component Value Date/Time   CALCIUM 8.1 (L) 11/15/2019 0208   ALKPHOS 55 11/15/2019 0208   AST 12 (L) 11/15/2019 0208   ALT 12 11/15/2019  0208   BILITOT 0.5 11/15/2019 0208   BILITOT 0.5 09/24/2019 0832       RADIOGRAPHIC STUDIES: DG Chest 1 View  Result Date: 10/30/2019 CLINICAL DATA:  Status post left thoracentesis. EXAM: CHEST  1 VIEW COMPARISON:  October 29, 2019. FINDINGS: Stable cardiomediastinal silhouette. No pneumothorax is noted. Minimal right basilar subsegmental atelectasis is noted. No pneumothorax is noted. Left pleural effusion is significantly smaller status post thoracentesis. Bony thorax is unremarkable. IMPRESSION: Left pleural effusion is significantly smaller status post thoracentesis. No pneumothorax is noted. Electronically Signed   By: Marijo Conception M.D.   On: 10/30/2019 12:31   DG Chest 2 View  Result Date: 11/15/2019 CLINICAL DATA:  Chest tube removal. EXAM: CHEST - 2 VIEW COMPARISON:  Same day. FINDINGS: Stable cardiomegaly. One left-sided chest tube has been removed. Remaining left-sided chest tube is noted in the lung base. No definite pneumothorax is noted. Stable nodular opacities are noted in the left upper lobe which may represent loculated effusions. Stable left basilar atelectasis and pleural thickening is noted. Minimal right basilar subsegmental atelectasis is noted. Bony thorax is unremarkable. IMPRESSION: One left-sided chest tube has been removed. Remaining left-sided chest tube is noted in the lung base. No definite pneumothorax seen. Stable nodular opacities in the left upper lobe which may represent loculated effusions. Electronically Signed   By: Marijo Conception M.D.   On: 11/15/2019 13:53   DG Chest 2 View  Result Date: 11/13/2019 CLINICAL DATA:  Preop exam. Left pleural effusion. Bronchoscopy today. EXAM: CHEST - 2 VIEW COMPARISON:  11/07/2019 FINDINGS: Interval increase in the size of a now moderate to large left pleural effusion. Overlying opacity. The right lung is clear. There is mild rightward mediastinal shift. Cardiac silhouette is largely obscured, but visualized portions  appear similar to prior. No pneumothorax. Multilevel degenerative change of the thoracic spine with flowing anterior osteophytes compatible with diffuse idiopathic skeletal hyperostosis (DISH). No acute osseous abnormality. IMPRESSION: Interval increase in the size of a now moderate to large left pleural effusion. Overlying opacities are favored to represent atelectasis/lung collapse. Electronically Signed   By: Margaretha Sheffield MD   On: 11/13/2019 07:50   DG Chest 2 View  Result Date: 11/08/2019 CLINICAL DATA:  Left pleural effusion EXAM: CHEST - 2 VIEW COMPARISON:  10/31/2019 FINDINGS: Increase in the left pleural effusion now moderate in size with further collapse/consolidation of the left lower lung. Stable right lung aeration. Heart is enlarged.  No superimposed edema pattern or CHF. No pneumothorax. Trachea midline. Aorta atherosclerotic and degenerative changes of the spine. Nonobstructive bowel gas pattern. IMPRESSION: Enlarging now moderate sized left pleural effusion with further left lower lung collapse/consolidation. Aortic Atherosclerosis (ICD10-I70.0). Electronically Signed   By: Jerilynn Mages.  Shick M.D.   On: 11/08/2019 08:03   DG Chest 2 View  Result Date: 10/31/2019 CLINICAL DATA:  Follow-up pleural effusion. EXAM: CHEST - 2 VIEW COMPARISON:  10/30/2019 FINDINGS: The cardiac silhouette, mediastinal and hilar contours are within normal limits and stable. Persistent but slightly smaller left pleural effusion with overlying atelectasis. IMPRESSION: Persistent but smaller left pleural effusion with overlying atelectasis. Electronically Signed   By: Marijo Sanes M.D.   On: 10/31/2019 08:26   DG Chest 2 View  Result Date: 10/29/2019 CLINICAL DATA:  Dyspnea on exertion. EXAM: CHEST - 2 VIEW COMPARISON:  January 06, 2010. FINDINGS: Interval development of large left pleural effusion resulting in left to right midline shift. Aerated left upper lobe is noted. No pneumothorax is noted. Right lung is clear.  Bony thorax is unremarkable. IMPRESSION: Interval development of large left pleural effusion resulting in left to right midline shift. Electronically Signed   By: Marijo Conception M.D.   On: 10/29/2019 15:38   CT CHEST W CONTRAST  Result Date: 10/30/2019 CLINICAL DATA:  Abnormal chest x-ray, pleural effusion EXAM: CT CHEST WITH CONTRAST TECHNIQUE: Multidetector CT imaging of the chest was performed during intravenous contrast administration. CONTRAST:  33m OMNIPAQUE IOHEXOL 300 MG/ML  SOLN COMPARISON:  Same-day radiograph, CT 05/15/2014 FINDINGS: Cardiovascular: The aortic root is suboptimally assessed given cardiac pulsation artifact. Atherosclerotic plaque within the normal caliber aorta. No acute luminal abnormality of the imaged aorta. No periaortic stranding or hemorrhage. Normal 3 vessel branching of the aortic arch. Proximal great vessels are mildly calcified but otherwise unremarkable. Cardiac size within normal limits. Trace pericardial fluid likely at the upper limits of physiologic normal. Small amount of fluid in the pericardial recesses as well. Three-vessel coronary artery disease is noted. Central pulmonary arteries normal caliber. No large central or lobar filling defects on this non tailored examination of the pulmonary arteries. Mediastinum/Nodes: Small amount of fluid in the pericardial recesses, as above. No mediastinal fluid or gas. Normal thyroid gland and thoracic inlet. No acute abnormality of the trachea or esophagus. No worrisome mediastinal, hilar or axillary adenopathy. Lungs/Pleura: There are regions of predominantly peribronchovascular ground-glass opacity throughout the left lung with partial atelectatic collapse of the left lower lobe. A moderate left pleural effusion is present with some fairly circumferential pleural thickening including some pleural nodularity along the left lung periphery and a larger pleural based nodule seen with aggressive when doing in the medial left lung  base (2/107). Lobular appearance of the effusion itself. Additional atelectatic changes are present the right lung base. Some mild diffuse airways thickening and scattered secretions are noted. Upper Abdomen: No acute abnormalities present in the visualized portions of the upper abdomen. Musculoskeletal: Multilevel degenerative changes are present in the imaged portions of the spine. No acute osseous abnormality or suspicious osseous lesion. No worrisome chest wall lesions. IMPRESSION: 1. Moderate left pleural effusion with features of loculation, circumferential pleural thickening, and multifocal pleural nodularity. Findings raise suspicion for a malignant or metastatic pleural process in the left lung with associated pleural effusion. Could consider PET-CT. 2. Patchy regions of predominantly peribronchovascular ground-glass opacity throughout the left lung is more likely to reflect a superimposed infectious/inflammatory process likely in the setting of a poorly ventilating  left lung. 3. Three-vessel coronary artery disease. 4. Aortic Atherosclerosis (ICD10-I70.0). Electronically Signed   By: Lovena Le M.D.   On: 10/30/2019 17:07   DG CHEST PORT 1 VIEW  Result Date: 11/15/2019 CLINICAL DATA:  Pleural effusion EXAM: PORTABLE CHEST 1 VIEW COMPARISON:  11/14/2019 FINDINGS: Shallow inspiration. Cardiac enlargement. 2 left chest tubes remain in place without change in position. No visible left pneumothorax. Mild subcutaneous emphysema along the lateral chest. Nodular opacity in the left apex and along the left chest wall may represent a loculated effusion. Degenerative changes in the spine. IMPRESSION: 1. Cardiac enlargement. 2. Two left chest tubes remain in place. No visible left pneumothorax. 3. Probable loculated fluid in the left apex and along the left chest wall. Electronically Signed   By: Lucienne Capers M.D.   On: 11/15/2019 05:48   DG CHEST PORT 1 VIEW  Result Date: 11/14/2019 CLINICAL DATA:   Follow-up PleurX catheter EXAM: PORTABLE CHEST 1 VIEW COMPARISON:  11/13/2019 FINDINGS: Cardiac shadow is mildly prominent but stable. Left-sided PleurX catheter is seen in satisfactory position. No pneumothorax is noted. No sizable effusion is seen. Minimal left basilar atelectasis is noted. Right lung remains clear. Degenerative changes of the thoracic spine are noted. IMPRESSION: PleurX catheter in place on the left. No pneumothorax or sizable effusion is seen. Mild left basilar atelectasis is noted. Electronically Signed   By: Inez Catalina M.D.   On: 11/14/2019 08:21   DG Chest Port 1 View  Result Date: 11/13/2019 CLINICAL DATA:  Prior lung surgery. EXAM: PORTABLE CHEST 1 VIEW COMPARISON:  11/13/2019. FINDINGS: Left chest tube and PleurX catheter noted. Interim resolution of previously identified large left pleural effusion. Minimal amount of subpleural air may be present along the left lung base. Continued follow-up exam suggested. No prominent pneumothorax. Bibasilar atelectasis. Stable cardiomegaly. Degenerative change thoracic spine. Mild left chest wall subcutaneous emphysema. IMPRESSION: Left chest tube and PleurX catheter noted. Interim resolution of previously identified large left pleural effusion. Minimal amount of subpleural air may be present along the left lung base. No prominent pneumothorax. Mild left chest wall subcutaneous emphysema. Continued follow-up exams suggested. Electronically Signed   By: Marcello Moores  Register   On: 11/13/2019 10:33   ECHOCARDIOGRAM COMPLETE  Result Date: 10/30/2019    ECHOCARDIOGRAM REPORT   Patient Name:   CLARNCE HOMAN Date of Exam: 10/30/2019 Medical Rec #:  [id number]       Height:       67.0 in Accession #:    [id number]      Weight:       196.0 lb Date of Birth:  08-27-1940       BSA:          2.005 m Patient Age:    35 years        BP:           114/93 mmHg Patient Gender: M               HR:           79 bpm. Exam Location:  Inpatient Procedure: 2D Echo,  Cardiac Doppler and Color Doppler Indications:    R06.02 SOB  History:        Patient has prior history of Echocardiogram examinations, most                 recent 06/07/2006. Abnormal ECG; Risk Factors:Hypertension.                 History of  cancer. Pleural effusion.  Sonographer:    Roseanna Rainbow RDCS Referring Phys: [id number] [place] T [facility]  Sonographer Comments: Technically difficult study due to poor echo windows. IMPRESSIONS  1. Left ventricular ejection fraction, by estimation, is 60 to 65%. The left ventricle has normal function. The left ventricle has no regional wall motion abnormalities. There is mild concentric left ventricular hypertrophy. Left ventricular diastolic parameters are consistent with Grade I diastolic dysfunction (impaired relaxation).  2. Right ventricular systolic function was not well visualized. The right ventricular size is not well visualized. Tricuspid regurgitation signal is inadequate for assessing PA pressure.  3. Large pleural effusion in the left lateral region.  4. The mitral valve is normal in structure. No evidence of mitral valve regurgitation. No evidence of mitral stenosis.  5. The aortic valve was not well visualized. Aortic valve regurgitation is not visualized.  6. The inferior vena cava is normal in size with greater than 50% respiratory variability, suggesting right atrial pressure of 3 mmHg. FINDINGS  Left Ventricle: Left ventricular ejection fraction, by estimation, is 60 to 65%. The left ventricle has normal function. The left ventricle has no regional wall motion abnormalities. The left ventricular internal cavity size was normal in size. There is  mild concentric left ventricular hypertrophy. Left ventricular diastolic parameters are consistent with Grade I diastolic dysfunction (impaired relaxation). Normal left ventricular filling pressure. Right Ventricle: The right ventricular size is not well visualized. Right vetricular wall thickness was not assessed. Right  ventricular systolic function was not well visualized. Tricuspid regurgitation signal is inadequate for assessing PA pressure. Left Atrium: Left atrial size was normal in size. Right Atrium: Right atrial size was normal in size. Pericardium: There is no evidence of pericardial effusion. Mitral Valve: The mitral valve is normal in structure. No evidence of mitral valve regurgitation. No evidence of mitral valve stenosis. Tricuspid Valve: The tricuspid valve is normal in structure. Tricuspid valve regurgitation is not demonstrated. No evidence of tricuspid stenosis. Aortic Valve: The aortic valve was not well visualized. Aortic valve regurgitation is not visualized. Pulmonic Valve: The pulmonic valve was normal in structure. Pulmonic valve regurgitation is not visualized. No evidence of pulmonic stenosis. Aorta: The aortic root is normal in size and structure. Venous: The inferior vena cava is normal in size with greater than 50% respiratory variability, suggesting right atrial pressure of 3 mmHg. IAS/Shunts: No atrial level shunt detected by color flow Doppler. Additional Comments: There is a large pleural effusion in the left lateral region.  LEFT VENTRICLE PLAX 2D LVIDd:         4.21 cm     Diastology LVIDs:         3.01 cm     LV e' medial:    5.77 cm/s LV PW:         1.20 cm     LV E/e' medial:  12.0 LV IVS:        1.16 cm     LV e' lateral:   9.46 cm/s LVOT diam:     2.00 cm     LV E/e' lateral: 7.3 LV SV:         49 LV SV Index:   25 LVOT Area:     3.14 cm  LV Volumes (MOD) LV vol d, MOD A2C: 50.5 ml LV vol d, MOD A4C: 53.6 ml LV vol s, MOD A2C: 20.9 ml LV vol s, MOD A4C: 17.4 ml LV SV MOD A2C:     29.6 ml LV SV  MOD A4C:     53.6 ml LV SV MOD BP:      32.3 ml RIGHT VENTRICLE            IVC RV S prime:     9.36 cm/s  IVC diam: 1.82 cm TAPSE (M-mode): 1.4 cm LEFT ATRIUM             Index       RIGHT ATRIUM           Index LA diam:        3.60 cm 1.80 cm/m  RA Area:     10.40 cm LA Vol (A2C):   30.1 ml 15.01  ml/m RA Volume:   23.60 ml  11.77 ml/m LA Vol (A4C):   19.2 ml 9.58 ml/m LA Biplane Vol: 24.4 ml 12.17 ml/m  AORTIC VALVE LVOT Vmax:   95.30 cm/s LVOT Vmean:  60.600 cm/s LVOT VTI:    0.157 m  AORTA Ao Root diam: 3.60 cm Ao Asc diam:  3.60 cm MITRAL VALVE MV Area (PHT): 2.39 cm    SHUNTS MV Decel Time: 317 msec    Systemic VTI:  0.16 m MV E velocity: 69.40 cm/s  Systemic Diam: 2.00 cm MV A velocity: 75.00 cm/s MV E/A ratio:  0.93 Fransico Him MD Electronically signed by Fransico Him MD Signature Date/Time: 10/30/2019/9:02:29 AM    Final    US THORACENTESIS ASP PLEURAL SPACE W/IMG GUIDE  Result Date: 10/30/2019 INDICATION: Patient with remote history of tobacco use, prostate cancer, hypertension, coronary artery disease; now with dyspnea, large left pleural effusion. Request received for diagnostic and therapeutic left thoracentesis. EXAM: ULTRASOUND GUIDED DIAGNOSTIC AND THERAPEUTIC LEFT THORACENTESIS MEDICATIONS: 1% lidocaine to skin and subcutaneous tissue COMPLICATIONS: None immediate. PROCEDURE: An ultrasound guided thoracentesis was thoroughly discussed with the patient and questions answered. The benefits, risks, alternatives and complications were also discussed. The patient understands and wishes to proceed with the procedure. Written consent was obtained. Ultrasound was performed to localize and mark an adequate pocket of fluid in the left chest. The area was then prepped and draped in the normal sterile fashion. 1% Lidocaine was used for local anesthesia. Under ultrasound guidance a 6 Fr Safe-T-Centesis catheter was introduced. Thoracentesis was performed. The catheter was removed and a dressing applied. FINDINGS: A total of approximately 2.5 liters of hazy,amber fluid was removed. Samples were sent to the laboratory as requested by the clinical team. IMPRESSION: Successful ultrasound guided diagnostic and therapeutic left thoracentesis yielding 2.5 liters of pleural fluid. Read by: Rowe Robert,  PA-C Electronically Signed   By: Jacqulynn Cadet M.D.   On: 10/30/2019 12:16    ASSESSMENT: This is a very pleasant 79 years old white male recently diagnosed with stage I/II malignant pleural mesothelioma involving the left hemithorax diagnosed in September 2021.  PLAN: I had a lengthy discussion with the patient and his friend as well as his son Jack Haynes who was available by phone about his current disease stage, prognosis and treatment options. I personally and independently reviewed the scan images and discussed the result and showed the images to the patient and his family. The patient is scheduled to have a PET scan next week.  He is not a great surgical candidate for resection based on the assessment of Dr. Servando Snare. I discussed with him the option of systemic chemotherapy versus systemic immunotherapy.  The patient is in great shape for systemic chemotherapy and I recommended for him a regimen consisting of carboplatin for AUC of 5,  Alimta 500 mg/M2 and Avastin 15 mg/KG every 3 weeks for 6 cycles followed by maintenance treatment with Alimta and Avastin.  If the patient has any evidence for disease progression on this regimen, we will switch him to immunotherapy with ipilimumab and nivolumab. I discussed with the patient the adverse effect of this treatment including but not limited to alopecia, myelosuppression, nausea and vomiting, peripheral neuropathy, liver or renal dysfunction. The patient will receive vitamin B12 injection today. I referred him to Dr. Servando Snare for consideration of Port-A-Cath placement for chemotherapy infusion. I will call his pharmacy with prescription for Compazine 10 mg p.o. every 6 hours as needed for nausea, folic acid 1 mg p.o. daily as well as Decadron 4 mg p.o. twice daily the day before, day of and day after chemotherapy and EMLA cream to be applied to the Port-A-Cath site before treatment. He is expected to start the first cycle of this treatment on  11/06/2019. The patient will have a chemotherapy education class before the first dose of his treatment. He was advised to call immediately if he has any concerning symptoms in the interval. The patient voices understanding of current disease status and treatment options and is in agreement with the current care plan.  All questions were answered. The patient knows to call the clinic with any problems, questions or concerns. We can certainly see the patient much sooner if necessary.  Thank you so much for allowing me to participate in the care of Jack Haynes. I will continue to follow up the patient with you and assist in his care.  The total time spent in the appointment was 90 minutes.  Disclaimer: This note was dictated with voice recognition software. Similar sounding words can inadvertently be transcribed and may not be corrected upon review.   Eilleen Kempf November 23, 2019, 8:59 AM

## 2019-11-23 NOTE — Telephone Encounter (Signed)
Scheduled per 10/8 los. Printed appt calendar for pt. Pt requested early mornings.

## 2019-11-23 NOTE — Progress Notes (Signed)
Mr. Jack Haynes denies chest pain or shortness of breath.  Mr. Jack Haynes denies any s/s of Covid and states he has not been around anyone who has.  Mr. Jack Haynes will be tested for Covid on 11/24/19 at 0930.

## 2019-11-23 NOTE — Progress Notes (Signed)
START ON PATHWAY REGIMEN - Mesothelioma   Bevacizumab 15 mg/kg IV + Carboplatin AUC=5 IV + Pemetrexed 500 mg/m2 IV q21 Days:   A cycle is every 21 days:     Pemetrexed      Carboplatin      Bevacizumab-xxxx   **Always confirm dose/schedule in your pharmacy ordering system**  Bevacizumab 15 mg/kg q21d:   A cycle is every 21 days:     Bevacizumab-xxxx   **Always confirm dose/schedule in your pharmacy ordering system**  Patient Characteristics: Newly Diagnosed, Preoperative or Nonsurgical Candidate (Clinical Staging), Pleurectomy/Decortication or Other Surgical Procedure Not Planned, First Line Therapeutic Status: Newly Diagnosed, Preoperative or Nonsurgical Candidate (Clinical Staging) AJCC T Category: cT1 AJCC N Category: cN0 AJCC 8 Stage Grouping: IA AJCC M Category: cM0  Intent of Therapy: Non-Curative / Palliative Intent, Discussed with Patient

## 2019-11-24 ENCOUNTER — Other Ambulatory Visit (HOSPITAL_COMMUNITY)
Admission: RE | Admit: 2019-11-24 | Discharge: 2019-11-24 | Disposition: A | Discharge status: Home / Self Care | Payer: Medicare PPO | Source: Ambulatory Visit | Attending: Cardiothoracic Surgery | Admitting: Cardiothoracic Surgery

## 2019-11-24 DIAGNOSIS — Z20822 Contact with and (suspected) exposure to covid-19: Secondary | ICD-10-CM | POA: Diagnosis not present

## 2019-11-24 DIAGNOSIS — Z01812 Encounter for preprocedural laboratory examination: Secondary | ICD-10-CM | POA: Diagnosis not present

## 2019-11-24 LAB — SARS CORONAVIRUS 2 (TAT 6-24 HRS): SARS Coronavirus 2: NEGATIVE

## 2019-11-25 NOTE — Anesthesia Preprocedure Evaluation (Addendum)
Anesthesia Evaluation  Patient identified by MRN, date of birth, ID band Patient awake    Reviewed: Allergy & Precautions, H&P , NPO status , Patient's Chart, lab work & pertinent test results  History of Anesthesia Complications Negative for: history of anesthetic complications  Airway Mallampati: II  TM Distance: >3 FB Neck ROM: Full    Dental no notable dental hx. (+) Dental Advisory Given,    Pulmonary former smoker,  Mesothelioma Malignant Pleural effusion: ressolved S/P VATS   Pulmonary exam normal        Cardiovascular hypertension, Pt. on medications Normal cardiovascular exam  Left Ventricle: Left ventricular ejection fraction, by estimation, is 60 to 65%. The left ventricle has normal function. The left ventricle has no regional wall motion abnormalities. The left ventricular internal cavity size was normal in size. There is  mild concentric left ventricular hypertrophy. Left ventricular diastolic parameters are consistent with Grade I diastolic dysfunction (impaired relaxation). Normal left ventricular filling pressure. Right Ventricle: The right ventricular size is not well visualized. Right vetricular wall thickness was not assessed. Right ventricular systolic function was not well visualized. Tricuspid regurgitation signal is inadequate for assessing PA pressure. Left Atrium: Left atrial size was normal in size. Right Atrium: Right atrial size was normal in size. Pericardium: There is no evidence of pericardial effusion. Mitral Valve: The mitral valve is normal in structure. No evidence of mitral valve regurgitation. No evidence of mitral valve stenosis. Tricuspid Valve: The tricuspid valve is normal in structure. Tricuspid valve regurgitation is not demonstrated. No evidence of tricuspid stenosis. Aortic Valve: The aortic valve was not well visualized. Aortic valve regurgitation is not visualized. Pulmonic Valve: The pulmonic  valve was normal in structure. Pulmonic valve regurgitation is not visualized. No evidence of pulmonic stenosis. Aorta: The aortic root is normal in size and structure. Venous: The inferior vena cava is normal in size with greater than 50% respiratory variability, suggesting right atrial pressure of 3 mmHg. IAS/Shunts: No atrial level shunt detected by color flow Doppler. Additional Comments: There is a large pleural effusion in the left lateral region   Neuro/Psych negative neurological ROS  negative psych ROS   GI/Hepatic negative GI ROS, Neg liver ROS,   Endo/Other  negative endocrine ROS  Renal/GU negative Renal ROS     Musculoskeletal  (+) Arthritis ,   Abdominal   Peds  Hematology   Anesthesia Other Findings   Reproductive/Obstetrics                            Anesthesia Physical  Anesthesia Plan  ASA: III  Anesthesia Plan: MAC   Post-op Pain Management:    Induction:   PONV Risk Score and Plan: 0 and Ondansetron and Propofol infusion  Airway Management Planned: Natural Airway and Simple Face Mask  Additional Equipment: CVP  Intra-op Plan:   Post-operative Plan:   Informed Consent: I have reviewed the patients History and Physical, chart, labs and discussed the procedure including the risks, benefits and alternatives for the proposed anesthesia with the patient or authorized representative who has indicated his/her understanding and acceptance.     Dental advisory given  Plan Discussed with: Anesthesiologist and CRNA  Anesthesia Plan Comments: (Difficult iv access, will place cvl.)      Anesthesia Quick Evaluation

## 2019-11-26 ENCOUNTER — Other Ambulatory Visit: Payer: Self-pay

## 2019-11-26 ENCOUNTER — Ambulatory Visit (HOSPITAL_COMMUNITY): Payer: Medicare PPO

## 2019-11-26 ENCOUNTER — Ambulatory Visit (HOSPITAL_COMMUNITY): Payer: Medicare PPO | Admitting: Certified Registered Nurse Anesthetist

## 2019-11-26 ENCOUNTER — Encounter (HOSPITAL_COMMUNITY)
Admission: RE | Disposition: A | Discharge status: Home / Self Care | Payer: Self-pay | Source: Home / Self Care | Attending: Cardiothoracic Surgery

## 2019-11-26 ENCOUNTER — Ambulatory Visit (HOSPITAL_COMMUNITY)
Admission: RE | Admit: 2019-11-26 | Discharge: 2019-11-26 | Disposition: A | Discharge status: Home / Self Care | Payer: Medicare PPO | Source: Ambulatory Visit | Attending: Cardiothoracic Surgery | Admitting: Cardiothoracic Surgery

## 2019-11-26 ENCOUNTER — Encounter (HOSPITAL_COMMUNITY): Payer: Self-pay | Admitting: Cardiothoracic Surgery

## 2019-11-26 ENCOUNTER — Encounter: Payer: Self-pay | Admitting: *Deleted

## 2019-11-26 ENCOUNTER — Ambulatory Visit (HOSPITAL_COMMUNITY)
Admission: RE | Admit: 2019-11-26 | Discharge: 2019-11-26 | Disposition: A | Discharge status: Home / Self Care | Payer: Medicare PPO | Attending: Cardiothoracic Surgery | Admitting: Cardiothoracic Surgery

## 2019-11-26 DIAGNOSIS — C457 Mesothelioma of other sites: Secondary | ICD-10-CM | POA: Diagnosis not present

## 2019-11-26 DIAGNOSIS — Z20822 Contact with and (suspected) exposure to covid-19: Secondary | ICD-10-CM | POA: Diagnosis not present

## 2019-11-26 DIAGNOSIS — I10 Essential (primary) hypertension: Secondary | ICD-10-CM | POA: Diagnosis not present

## 2019-11-26 DIAGNOSIS — Z01812 Encounter for preprocedural laboratory examination: Secondary | ICD-10-CM | POA: Diagnosis not present

## 2019-11-26 DIAGNOSIS — J90 Pleural effusion, not elsewhere classified: Secondary | ICD-10-CM | POA: Diagnosis not present

## 2019-11-26 DIAGNOSIS — Z419 Encounter for procedure for purposes other than remedying health state, unspecified: Secondary | ICD-10-CM

## 2019-11-26 DIAGNOSIS — C3490 Malignant neoplasm of unspecified part of unspecified bronchus or lung: Secondary | ICD-10-CM | POA: Diagnosis not present

## 2019-11-26 DIAGNOSIS — C450 Mesothelioma of pleura: Secondary | ICD-10-CM | POA: Diagnosis not present

## 2019-11-26 DIAGNOSIS — Z87891 Personal history of nicotine dependence: Secondary | ICD-10-CM | POA: Diagnosis not present

## 2019-11-26 DIAGNOSIS — Z8709 Personal history of other diseases of the respiratory system: Secondary | ICD-10-CM | POA: Diagnosis not present

## 2019-11-26 DIAGNOSIS — Z95828 Presence of other vascular implants and grafts: Secondary | ICD-10-CM

## 2019-11-26 DIAGNOSIS — Z452 Encounter for adjustment and management of vascular access device: Secondary | ICD-10-CM | POA: Diagnosis not present

## 2019-11-26 HISTORY — DX: Dyspnea, unspecified: R06.00

## 2019-11-26 HISTORY — DX: Mesothelioma, unspecified: C45.9

## 2019-11-26 HISTORY — PX: PORTACATH PLACEMENT: SHX2246

## 2019-11-26 HISTORY — PX: REMOVAL OF PLEURAL DRAINAGE CATHETER: SHX5080

## 2019-11-26 LAB — CBC
HCT: 42.8 % (ref 39.0–52.0)
Hemoglobin: 14.2 g/dL (ref 13.0–17.0)
MCH: 30 pg (ref 26.0–34.0)
MCHC: 33.2 g/dL (ref 30.0–36.0)
MCV: 90.5 fL (ref 80.0–100.0)
Platelets: 272 10*3/uL (ref 150–400)
RBC: 4.73 MIL/uL (ref 4.22–5.81)
RDW: 11.3 % — ABNORMAL LOW (ref 11.5–15.5)
WBC: 7.5 10*3/uL (ref 4.0–10.5)
nRBC: 0 % (ref 0.0–0.2)

## 2019-11-26 LAB — COMPREHENSIVE METABOLIC PANEL
ALT: 34 U/L (ref 0–44)
AST: 17 U/L (ref 15–41)
Albumin: 3.2 g/dL — ABNORMAL LOW (ref 3.5–5.0)
Alkaline Phosphatase: 69 U/L (ref 38–126)
Anion gap: 10 (ref 5–15)
BUN: 11 mg/dL (ref 8–23)
CO2: 23 mmol/L (ref 22–32)
Calcium: 8.7 mg/dL — ABNORMAL LOW (ref 8.9–10.3)
Chloride: 102 mmol/L (ref 98–111)
Creatinine, Ser: 0.9 mg/dL (ref 0.61–1.24)
GFR, Estimated: >60 mL/min (ref >60)
Glucose, Bld: 134 mg/dL — ABNORMAL HIGH (ref 70–99)
Potassium: 4.1 mmol/L (ref 3.5–5.1)
Sodium: 135 mmol/L (ref 135–145)
Total Bilirubin: 0.4 mg/dL (ref 0.3–1.2)
Total Protein: 6.8 g/dL (ref 6.5–8.1)

## 2019-11-26 LAB — PROTIME-INR
INR: 1 (ref 0.8–1.2)
Prothrombin Time: 13 seconds (ref 11.4–15.2)

## 2019-11-26 LAB — APTT: aPTT: 30 seconds (ref 24–36)

## 2019-11-26 SURGERY — INSERTION, TUNNELED CENTRAL VENOUS DEVICE, WITH PORT
Anesthesia: Monitor Anesthesia Care | Site: Chest | Laterality: Left

## 2019-11-26 MED ORDER — FENTANYL CITRATE (PF) 250 MCG/5ML IJ SOLN
INTRAMUSCULAR | Status: AC
  Filled 2019-11-26: qty 5

## 2019-11-26 MED ORDER — SODIUM CHLORIDE 0.9 % IV SOLN
INTRAVENOUS | Status: AC
  Filled 2019-11-26: qty 1.2

## 2019-11-26 MED ORDER — SODIUM CHLORIDE 0.9 % IV SOLN
INTRAVENOUS | Status: DC | PRN
  Administered 2019-11-26: 07:00:00 500 mL

## 2019-11-26 MED ORDER — PROPOFOL 10 MG/ML IV BOLUS
INTRAVENOUS | Status: AC
  Filled 2019-11-26: qty 20

## 2019-11-26 MED ORDER — ORAL CARE MOUTH RINSE: 15.0000 mL | Freq: Once | OROMUCOSAL | Status: AC

## 2019-11-26 MED ORDER — MIDAZOLAM HCL 2 MG/2ML IJ SOLN
INTRAMUSCULAR | Status: AC
  Filled 2019-11-26: qty 2

## 2019-11-26 MED ORDER — CHLORHEXIDINE GLUCONATE 0.12 % MT SOLN: 15.0000 mL | Freq: Once | OROMUCOSAL | Status: AC

## 2019-11-26 MED ORDER — PROPOFOL 500 MG/50ML IV EMUL
INTRAVENOUS | Status: DC | PRN
  Administered 2019-11-26: 75 ug/kg/min via INTRAVENOUS

## 2019-11-26 MED ORDER — HEPARIN SODIUM (PORCINE) 1000 UNIT/ML IJ SOLN
INTRAMUSCULAR | Status: DC | PRN
  Administered 2019-11-26: 1000 [IU]

## 2019-11-26 MED ORDER — LIDOCAINE HCL (PF) 1 % IJ SOLN
INTRAMUSCULAR | Status: DC | PRN
  Administered 2019-11-26: 30 mL via INTRADERMAL

## 2019-11-26 MED ORDER — GADOBUTROL 1 MMOL/ML IV SOLN
8.0000 mL | Freq: Once | INTRAVENOUS | Status: AC | PRN
  Administered 2019-11-26: 8 mL via INTRAVENOUS

## 2019-11-26 MED ORDER — PROPOFOL 10 MG/ML IV BOLUS
INTRAVENOUS | Status: DC | PRN
  Administered 2019-11-26: 20 mg via INTRAVENOUS

## 2019-11-26 MED ORDER — ACETAMINOPHEN 500 MG PO TABS
1000.0000 mg | ORAL_TABLET | Freq: Once | ORAL | Status: AC
  Administered 2019-11-26: 1000 mg via ORAL
  Filled 2019-11-26: qty 2

## 2019-11-26 MED ORDER — FENTANYL CITRATE (PF) 100 MCG/2ML IJ SOLN: 25.0000 ug | INTRAMUSCULAR | Status: DC | PRN

## 2019-11-26 MED ORDER — MIDAZOLAM HCL 5 MG/5ML IJ SOLN
INTRAMUSCULAR | Status: DC | PRN
  Administered 2019-11-26: .5 mg via INTRAVENOUS

## 2019-11-26 MED ORDER — LACTATED RINGERS IV SOLN: INTRAVENOUS | Status: DC | PRN

## 2019-11-26 MED ORDER — LIDOCAINE HCL (PF) 1 % IJ SOLN
INTRAMUSCULAR | Status: AC
  Filled 2019-11-26: qty 30

## 2019-11-26 MED ORDER — CEFAZOLIN SODIUM-DEXTROSE 2-4 GM/100ML-% IV SOLN
2.0000 g | INTRAVENOUS | Status: AC
  Administered 2019-11-26: 2 g via INTRAVENOUS
  Filled 2019-11-26: qty 100

## 2019-11-26 MED ORDER — PHENYLEPHRINE 40 MCG/ML (10ML) SYRINGE FOR IV PUSH (FOR BLOOD PRESSURE SUPPORT): PREFILLED_SYRINGE | INTRAVENOUS | Status: DC | PRN

## 2019-11-26 MED ORDER — ONDANSETRON HCL 4 MG/2ML IJ SOLN
INTRAMUSCULAR | Status: DC | PRN
  Administered 2019-11-26: 4 mg via INTRAVENOUS

## 2019-11-26 MED ORDER — HEPARIN SODIUM (PORCINE) 1000 UNIT/ML IJ SOLN
INTRAMUSCULAR | Status: AC
  Filled 2019-11-26: qty 1

## 2019-11-26 MED ORDER — ONDANSETRON HCL 4 MG/2ML IJ SOLN
INTRAMUSCULAR | Status: AC
  Filled 2019-11-26: qty 2

## 2019-11-26 MED ORDER — CHLORHEXIDINE GLUCONATE 0.12 % MT SOLN
OROMUCOSAL | Status: AC
  Administered 2019-11-26: 15 mL via OROMUCOSAL
  Filled 2019-11-26: qty 15

## 2019-11-26 MED ORDER — PROMETHAZINE HCL 25 MG/ML IJ SOLN: 6.2500 mg | INTRAMUSCULAR | Status: DC | PRN

## 2019-11-26 MED ORDER — LACTATED RINGERS IV SOLN: INTRAVENOUS | Status: DC

## 2019-11-26 SURGICAL SUPPLY — 50 items
BAG DECANTER FOR FLEXI CONT (MISCELLANEOUS) ×3 IMPLANT
BLADE CLIPPER SURG (BLADE) IMPLANT
BLADE SURG SZ11 CARB STEEL (BLADE) ×3 IMPLANT
CANISTER SUCT 3000ML PPV (MISCELLANEOUS) ×3 IMPLANT
COVER PROBE W GEL 5X96 (DRAPES) ×6 IMPLANT
COVER SURGICAL LIGHT HANDLE (MISCELLANEOUS) ×3 IMPLANT
DERMABOND ADVANCED (GAUZE/BANDAGES/DRESSINGS) ×1
DERMABOND ADVANCED .7 DNX12 (GAUZE/BANDAGES/DRESSINGS) ×2 IMPLANT
DRAPE C-ARM 42X72 X-RAY (DRAPES) ×3 IMPLANT
DRAPE CHEST BREAST 15X10 FENES (DRAPES) IMPLANT
DRAPE UNIVERSAL PACK (DRAPES) ×3 IMPLANT
ELECT CAUTERY BLADE 6.4 (BLADE) IMPLANT
ELECT REM PT RETURN 9FT ADLT (ELECTROSURGICAL) ×3
ELECTRODE REM PT RTRN 9FT ADLT (ELECTROSURGICAL) ×2 IMPLANT
FILTER SMOKE EVAC ULPA (FILTER) ×3 IMPLANT
GAUZE SPONGE 4X4 12PLY STRL (GAUZE/BANDAGES/DRESSINGS) ×3 IMPLANT
GAUZE SPONGE 4X4 12PLY STRL LF (GAUZE/BANDAGES/DRESSINGS) ×3 IMPLANT
GAUZE XEROFORM 1X8 LF (GAUZE/BANDAGES/DRESSINGS) ×3 IMPLANT
GLOVE BIO SURGEON STRL SZ 6.5 (GLOVE) ×9 IMPLANT
GLOVE BIOGEL PI IND STRL 6.5 (GLOVE) ×2 IMPLANT
GLOVE BIOGEL PI IND STRL 7.5 (GLOVE) ×2 IMPLANT
GLOVE BIOGEL PI INDICATOR 6.5 (GLOVE) ×1
GLOVE BIOGEL PI INDICATOR 7.5 (GLOVE) ×1
GOWN STRL REUS W/ TWL LRG LVL3 (GOWN DISPOSABLE) ×8 IMPLANT
GOWN STRL REUS W/TWL LRG LVL3 (GOWN DISPOSABLE) ×12
GUIDEWIRE UNCOATED ST S 7038 (WIRE) IMPLANT
INTRODUCER 13FR (INTRODUCER) IMPLANT
INTRODUCER COOK 11FR (CATHETERS) IMPLANT
KIT BASIN OR (CUSTOM PROCEDURE TRAY) ×3 IMPLANT
KIT PORT POWER 9.6FR MRI PREA (Stent) ×3 IMPLANT
KIT TURNOVER KIT B (KITS) ×3 IMPLANT
NEEDLE 22X1 1/2 (OR ONLY) (NEEDLE) IMPLANT
NS IRRIG 1000ML POUR BTL (IV SOLUTION) ×3 IMPLANT
PACK GENERAL/GYN (CUSTOM PROCEDURE TRAY) ×3 IMPLANT
PAD ARMBOARD 7.5X6 YLW CONV (MISCELLANEOUS) ×6 IMPLANT
PENCIL SMOKE EVACUATOR (MISCELLANEOUS) ×3 IMPLANT
SET SHEATH INTRODUCER 10FR (MISCELLANEOUS) IMPLANT
SLEEVE SUCTION 125 (MISCELLANEOUS) ×3 IMPLANT
SUT SILK 2 0 SH (SUTURE) ×3 IMPLANT
SUT VIC AB 3-0 SH 8-18 (SUTURE) ×3 IMPLANT
SUT VICRYL 4-0 PS2 18IN ABS (SUTURE) ×6 IMPLANT
SYR 20ML LL LF (SYRINGE) IMPLANT
SYR 5ML LUER SLIP (SYRINGE) ×3 IMPLANT
SYR CONTROL 10ML LL (SYRINGE) ×3 IMPLANT
TAPE CLOTH SURG 4X10 WHT LF (GAUZE/BANDAGES/DRESSINGS) ×3 IMPLANT
TOWEL GREEN STERILE (TOWEL DISPOSABLE) ×3 IMPLANT
TOWEL GREEN STERILE FF (TOWEL DISPOSABLE) ×3 IMPLANT
TRAP FLUID SMOKE EVACUATOR (MISCELLANEOUS) ×3 IMPLANT
TRAY LAPAROSCOPIC MC (CUSTOM PROCEDURE TRAY) IMPLANT
WATER STERILE IRR 1000ML POUR (IV SOLUTION) IMPLANT

## 2019-11-26 NOTE — Transfer of Care (Signed)
Immediate Anesthesia Transfer of Care Note  Patient: Jack Haynes  Procedure(s) Performed: INSERTION PORT-A-CATH BARD POWERPORT 9.6 FR CATHETER (Left Chest) REMOVAL OF PLEURAL DRAINAGE CATHETER (Left )  Patient Location: PACU  Anesthesia Type:MAC  Level of Consciousness: awake  Airway & Oxygen Therapy: Patient Spontanous Breathing and Patient connected to face mask oxygen  Post-op Assessment: Report given to RN and Post -op Vital signs reviewed and stable  Post vital signs: Reviewed  Last Vitals:  Vitals Value Taken Time  BP 142/88 11/26/19 0841  Temp    Pulse 78 11/26/19 0842  Resp 19 11/26/19 0842  SpO2 99 % 11/26/19 0842  Vitals shown include unvalidated device data.  Last Pain:  Vitals:   11/26/19 0608  TempSrc:   PainSc: 0-No pain      Patients Stated Pain Goal: 0 (41/93/79 0240)  Complications: No complications documented.

## 2019-11-26 NOTE — H&P (Signed)
[address]       [place] [address]             [phone]                    Aubery E Fede [place] Medical Record #[id number] Date of Birth: 1940-07-11  Referring: Freddi Starr, MD Primary Care: [name], MD Primary Cardiologist: No primary care provider on file.  Chief Complaint:    Chief Complaint  Patient presents with  . Pleural Effusion    Surgical consult, Chest CT 10/30/19, CXR 10/31/19, 11/07/19    History of Present Illness:    Jack Haynes 79 y.o. male patient has been doing well since left video-assisted thoracoscopy with biopsy and talc pleurodesis and placement of Pleurx.  Since his surgery the Pleurx drainage has decreased to nail, chest x-ray is stable without significant recurrence of pleural fluid.  Pathology confirmed mesothelioma.  Patient is now here for port placement and since the Pleurx is no longer draining will remove at the same time  Current Activity/ Functional Status:  Patient is independent with mobility/ambulation, transfers, ADL's, IADL's.   Zubrod Score: At the time of surgery this patient's most appropriate activity status/level should be described as: []     0    Normal activity, no symptoms [x]     1    Restricted in physical strenuous activity but ambulatory, able to do out light work []     2    Ambulatory and capable of self care, unable to do work activities, up and about               >50 % of waking hours                              []     3    Only limited self care, in bed greater than 50% of waking hours []     4    Completely disabled, no self care, confined to bed or chair []     5    Moribund   Past Medical History:  Diagnosis Date  . Arthritis   . Cancer (HCC)    hx of prostatae cancer  . Dyspnea    until chest tube placed  . Hypertension   . Mesothelioma [facility])     Past Surgical History:  Procedure Laterality Date  . APPENDECTOMY    . BACK SURGERY  1998   disectomy  .  CHEST TUBE INSERTION Left 11/13/2019   Procedure: INSERTION PLEURAL DRAINAGE CATHETER;  Surgeon: Grace Isaac, MD;  Location: [place];  Service: Thoracic;  Laterality: Left;  . EYE SURGERY Left 1965   injury  . JOINT REPLACEMENT Left 2010   left knee replacement  . KNEE SURGERY    . PLEURAL BIOPSY Left 11/13/2019   Procedure: PLEURAL BIOPSY;  Surgeon: Grace Isaac, MD;  Location: [place];  Service: Thoracic;  Laterality: Left;  . PROSTATE SURGERY    . SPINE SURGERY    . TALC PLEURODESIS Left 11/13/2019   Procedure: possible TALC PLEURADESIS;  Surgeon: Grace Isaac, MD;  Location: [place];  Service: Thoracic;  Laterality: Left;  . TOTAL KNEE ARTHROPLASTY  02/01/2012   Procedure: TOTAL KNEE ARTHROPLASTY;  Surgeon: Sydnee Cabal, MD;  Location: [facility];  Service: Orthopedics;  Laterality: Left;  Marland Kitchen VIDEO ASSISTED THORACOSCOPY Left 11/13/2019   Procedure:  VIDEO ASSISTED THORACOSCOPY;  Surgeon: Grace Isaac, MD;  Location: [place];  Service: Thoracic;  Laterality: Left;  Marland Kitchen VIDEO BRONCHOSCOPY N/A 11/13/2019   Procedure: VIDEO BRONCHOSCOPY;  Surgeon: Grace Isaac, MD;  Location: [facility] OR;  Service: Thoracic;  Laterality: N/A;    Family History  Problem Relation Age of Onset  . Cancer Sister        brain tumor  . Heart disease Brother   . Alzheimer's disease Brother   . Heart disease Sister   . Alzheimer's disease Father   . Hypertension Sister   . Colon cancer Neg Hx   . Colon polyps Neg Hx   . Esophageal cancer Neg Hx   . Rectal cancer Neg Hx   . Stomach cancer Neg Hx      Social History   Tobacco Use  Smoking Status Former Smoker  . Years: 10.00  . Quit date: 02/15/1981  . Years since quitting: 38.8  Smokeless Tobacco Former Systems developer  . Types: Chew  . Quit date: 12/2018    Social History   Substance and Sexual Activity  Alcohol Use Yes  . Alcohol/week: 3.0 standard drinks  . Types: 3 Shots of liquor per week     No Known Allergies  Current  Facility-Administered Medications  Medication Dose Route Frequency Provider Last Rate Last Admin  . ceFAZolin (ANCEF) IVPB 2g/100 mL premix  2 g Intravenous 30 min Pre-Op Grace Isaac, MD      . heparin 6,000 Units in sodium chloride 0.9 % 500 mL irrigation    PRN Grace Isaac, MD   500 mL at 11/26/19 0646  . lactated ringers infusion   Intravenous Continuous Duane Boston, MD      . lidocaine (PF) (XYLOCAINE) 1 % injection    PRN Grace Isaac, MD   30 mL at 11/26/19 0646   Facility-Administered Medications Ordered in Other Encounters  Medication Dose Route Frequency Provider Last Rate Last Admin  . lactated ringers infusion   Intravenous Continuous PRN Janene Harvey, CRNA   New Bag at 11/26/19 [phone]    Pertinent items are noted in HPI.   Review of Systems:     Cardiac Review of Systems: [Y] = yes  or   [ N ] = no   Chest Pain [ n   ]  Resting SOB [   ] Exertional SOB  Blue.Reese  ]  Orthopnea Florencio.Farrier  ]   Pedal Edema [ n  ]    Palpitations Florencio.Farrier  ] Syncope  [ n ]   Presyncope [  n ]   General Review of Systems: [Y] = yes [  ]=no Constitional: recent weight change [  ];  Wt loss over the last 3 months [   ] anorexia [  ]; fatigue [  ]; nausea [  ]; night sweats [  ]; fever [  ]; or chills [  ];           Eye : blurred vision [  ]; diplopia [   ]; vision changes [  ];  Amaurosis fugax[  ]; Resp: cough Blue.Reese  ];  wheezing[ y ];  hemoptysis[ n ]; shortness of breath[  y]; paroxysmal nocturnal dyspnea[  ]; dyspnea on exertion[ y ]; or orthopnea[  ];  GI:  gallstones[  ], vomiting[  ];  dysphagia[  ]; melena[  ];  hematochezia [  ]; heartburn[  ];   Hx of  Colonoscopy[  ];  GU: kidney stones [  ]; hematuria[  ];   dysuria [  ];  nocturia[  ];  history of     obstruction [  ]; urinary frequency [  ]             Skin: rash, swelling[  ];, hair loss[  ];  peripheral edema[  ];  or itching[  ]; Musculosketetal: myalgias[  ];  joint swelling[  ];  joint erythema[  ];  joint pain[  ];  back pain[   ];  Heme/Lymph: bruising[  ];  bleeding[  ];  anemia[  ];  Neuro: TIA[  ];  headaches[  ];  stroke[  ];  vertigo[  ];  seizures[  ];   paresthesias[  ];  difficulty walking[  ];  Psych:depression[  ]; anxiety[  ];  Endocrine: diabetes[ n ];  thyroid dysfunction[  ];  Immunizations: Flu up to date [ y ]; Pneumococcal up to date Blue.Reese  ]; COVID-18 vacination completed  Blue.Reese   ]  Other:     PHYSICAL EXAMINATION: BP 134/81   Pulse 84   Temp 97.7 F (36.5 C) (Oral)   Resp 18   Ht 5\' 7"  (1.702 m)   Wt 84.2 kg   SpO2 98%   BMI 29.07 kg/m  General appearance: alert and cooperative Head: Normocephalic, without obvious abnormality, atraumatic Neck: no adenopathy, no carotid bruit, no JVD, supple, symmetrical, trachea midline and thyroid not enlarged, symmetric, no tenderness/mass/nodules Lymph nodes: Cervical, supraclavicular, and axillary nodes normal. Resp:  breath sounds equal Cardio: regular rate and rhythm, S1, S2 normal, no murmur, click, rub or gallop GI: soft, non-tender; bowel sounds normal; no masses,  no organomegaly Extremities: extremities normal, atraumatic, no cyanosis or edema and Homans sign is negative, no sign of DVT Neurologic: Grossly normal  Diagnostic Studies & Laboratory data:     Recent Radiology Findings:   Xray this am shoes increasing large  left effusion- back comparable with [facility]ray on [facility]   DG Chest 1 View  Result Date: 10/30/2019 CLINICAL DATA:  Status post left thoracentesis. EXAM: CHEST  1 VIEW COMPARISON:  October 29, 2019. FINDINGS: Stable cardiomediastinal silhouette. No pneumothorax is noted. Minimal right basilar subsegmental atelectasis is noted. No pneumothorax is noted. Left pleural effusion is significantly smaller status post thoracentesis. Bony thorax is unremarkable. IMPRESSION: Left pleural effusion is significantly smaller status post thoracentesis. No pneumothorax is noted. Electronically Signed   By: Marijo Conception M.D.   On:  10/30/2019 12:31   DG Chest 2 View  Result Date: 11/08/2019 CLINICAL DATA:  Left pleural effusion EXAM: CHEST - 2 VIEW COMPARISON:  10/31/2019 FINDINGS: Increase in the left pleural effusion now moderate in size with further collapse/consolidation of the left lower lung. Stable right lung aeration. Heart is enlarged. No superimposed edema pattern or CHF. No pneumothorax. Trachea midline. Aorta atherosclerotic and degenerative changes of the spine. Nonobstructive bowel gas pattern. IMPRESSION: Enlarging now moderate sized left pleural effusion with further left lower lung collapse/consolidation. Aortic Atherosclerosis (ICD10-I70.0). Electronically Signed   By: Jerilynn Mages.  Shick M.D.   On: 11/08/2019 08:03   DG Chest 2 View  Result Date: 10/31/2019 CLINICAL DATA:  Follow-up pleural effusion. EXAM: CHEST - 2 VIEW COMPARISON:  10/30/2019 FINDINGS: The cardiac silhouette, mediastinal and hilar contours are within normal limits and stable. Persistent but slightly smaller left pleural effusion with overlying atelectasis. IMPRESSION: Persistent but smaller left pleural effusion with overlying atelectasis. Electronically Signed   By: Mamie Nick.  Gallerani M.D.   On: 10/31/2019 08:26   DG Chest 2 View  Result Date: 10/29/2019 CLINICAL DATA:  Dyspnea on exertion. EXAM: CHEST - 2 VIEW COMPARISON:  January 06, 2010. FINDINGS: Interval development of large left pleural effusion resulting in left to right midline shift. Aerated left upper lobe is noted. No pneumothorax is noted. Right lung is clear. Bony thorax is unremarkable. IMPRESSION: Interval development of large left pleural effusion resulting in left to right midline shift. Electronically Signed   By: Marijo Conception M.D.   On: 10/29/2019 15:38   CT CHEST W CONTRAST  Result Date: 10/30/2019 CLINICAL DATA:  Abnormal chest x-ray, pleural effusion EXAM: CT CHEST WITH CONTRAST TECHNIQUE: Multidetector CT imaging of the chest was performed during intravenous contrast  administration. CONTRAST:  28mL OMNIPAQUE IOHEXOL 300 MG/ML  SOLN COMPARISON:  Same-day radiograph, CT 05/15/2014 FINDINGS: Cardiovascular: The aortic root is suboptimally assessed given cardiac pulsation artifact. Atherosclerotic plaque within the normal caliber aorta. No acute luminal abnormality of the imaged aorta. No periaortic stranding or hemorrhage. Normal 3 vessel branching of the aortic arch. Proximal great vessels are mildly calcified but otherwise unremarkable. Cardiac size within normal limits. Trace pericardial fluid likely at the upper limits of physiologic normal. Small amount of fluid in the pericardial recesses as well. Three-vessel coronary artery disease is noted. Central pulmonary arteries normal caliber. No large central or lobar filling defects on this non tailored examination of the pulmonary arteries. Mediastinum/Nodes: Small amount of fluid in the pericardial recesses, as above. No mediastinal fluid or gas. Normal thyroid gland and thoracic inlet. No acute abnormality of the trachea or esophagus. No worrisome mediastinal, hilar or axillary adenopathy. Lungs/Pleura: There are regions of predominantly peribronchovascular ground-glass opacity throughout the left lung with partial atelectatic collapse of the left lower lobe. A moderate left pleural effusion is present with some fairly circumferential pleural thickening including some pleural nodularity along the left lung periphery and a larger pleural based nodule seen with aggressive when doing in the medial left lung base (2/107). Lobular appearance of the effusion itself. Additional atelectatic changes are present the right lung base. Some mild diffuse airways thickening and scattered secretions are noted. Upper Abdomen: No acute abnormalities present in the visualized portions of the upper abdomen. Musculoskeletal: Multilevel degenerative changes are present in the imaged portions of the spine. No acute osseous abnormality or suspicious  osseous lesion. No worrisome chest wall lesions. IMPRESSION: 1. Moderate left pleural effusion with features of loculation, circumferential pleural thickening, and multifocal pleural nodularity. Findings raise suspicion for a malignant or metastatic pleural process in the left lung with associated pleural effusion. Could consider PET-CT. 2. Patchy regions of predominantly peribronchovascular ground-glass opacity throughout the left lung is more likely to reflect a superimposed infectious/inflammatory process likely in the setting of a poorly ventilating left lung. 3. Three-vessel coronary artery disease. 4. Aortic Atherosclerosis (ICD10-I70.0). Electronically Signed   By: Lovena Le M.D.   On: 10/30/2019 17:07   ECHOCARDIOGRAM COMPLETE  Result Date: 10/30/2019    ECHOCARDIOGRAM REPORT   Patient Name:   NAZAIAH NAVARRETE Date of Exam: 10/30/2019 Medical Rec #:  [id number]       Height:       67.0 in Accession #:    [id number]      Weight:       196.0 lb Date of Birth:  1940-06-04       BSA:          2.005 m Patient Age:  78 years        BP:           114/93 mmHg Patient Gender: M               HR:           79 bpm. Exam Location:  Inpatient Procedure: 2D Echo, Cardiac Doppler and Color Doppler Indications:    R06.02 SOB  History:        Patient has prior history of Echocardiogram examinations, most                 recent 06/07/2006. Abnormal ECG; Risk Factors:Hypertension.                 History of cancer. Pleural effusion.  Sonographer:    Roseanna Rainbow RDCS Referring Phys: [id number] [place] T TU  Sonographer Comments: Technically difficult study due to poor echo windows. IMPRESSIONS  1. Left ventricular ejection fraction, by estimation, is 60 to 65%. The left ventricle has normal function. The left ventricle has no regional wall motion abnormalities. There is mild concentric left ventricular hypertrophy. Left ventricular diastolic parameters are consistent with Grade I diastolic dysfunction (impaired relaxation).  2.  Right ventricular systolic function was not well visualized. The right ventricular size is not well visualized. Tricuspid regurgitation signal is inadequate for assessing PA pressure.  3. Large pleural effusion in the left lateral region.  4. The mitral valve is normal in structure. No evidence of mitral valve regurgitation. No evidence of mitral stenosis.  5. The aortic valve was not well visualized. Aortic valve regurgitation is not visualized.  6. The inferior vena cava is normal in size with greater than 50% respiratory variability, suggesting right atrial pressure of 3 mmHg. FINDINGS  Left Ventricle: Left ventricular ejection fraction, by estimation, is 60 to 65%. The left ventricle has normal function. The left ventricle has no regional wall motion abnormalities. The left ventricular internal cavity size was normal in size. There is  mild concentric left ventricular hypertrophy. Left ventricular diastolic parameters are consistent with Grade I diastolic dysfunction (impaired relaxation). Normal left ventricular filling pressure. Right Ventricle: The right ventricular size is not well visualized. Right vetricular wall thickness was not assessed. Right ventricular systolic function was not well visualized. Tricuspid regurgitation signal is inadequate for assessing PA pressure. Left Atrium: Left atrial size was normal in size. Right Atrium: Right atrial size was normal in size. Pericardium: There is no evidence of pericardial effusion. Mitral Valve: The mitral valve is normal in structure. No evidence of mitral valve regurgitation. No evidence of mitral valve stenosis. Tricuspid Valve: The tricuspid valve is normal in structure. Tricuspid valve regurgitation is not demonstrated. No evidence of tricuspid stenosis. Aortic Valve: The aortic valve was not well visualized. Aortic valve regurgitation is not visualized. Pulmonic Valve: The pulmonic valve was normal in structure. Pulmonic valve regurgitation is not  visualized. No evidence of pulmonic stenosis. Aorta: The aortic root is normal in size and structure. Venous: The inferior vena cava is normal in size with greater than 50% respiratory variability, suggesting right atrial pressure of 3 mmHg. IAS/Shunts: No atrial level shunt detected by color flow Doppler. Additional Comments: There is a large pleural effusion in the left lateral region.  LEFT VENTRICLE PLAX 2D LVIDd:         4.21 cm     Diastology LVIDs:         3.01 cm     LV e' medial:    5.77 cm/s  LV PW:         1.20 cm     LV E/e' medial:  12.0 LV IVS:        1.16 cm     LV e' lateral:   9.46 cm/s LVOT diam:     2.00 cm     LV E/e' lateral: 7.3 LV SV:         49 LV SV Index:   25 LVOT Area:     3.14 cm  LV Volumes (MOD) LV vol d, MOD A2C: 50.5 ml LV vol d, MOD A4C: 53.6 ml LV vol s, MOD A2C: 20.9 ml LV vol s, MOD A4C: 17.4 ml LV SV MOD A2C:     29.6 ml LV SV MOD A4C:     53.6 ml LV SV MOD BP:      32.3 ml RIGHT VENTRICLE            IVC RV S prime:     9.36 cm/s  IVC diam: 1.82 cm TAPSE (M-mode): 1.4 cm LEFT ATRIUM             Index       RIGHT ATRIUM           Index LA diam:        3.60 cm 1.80 cm/m  RA Area:     10.40 cm LA Vol (A2C):   30.1 ml 15.01 ml/m RA Volume:   23.60 ml  11.77 ml/m LA Vol (A4C):   19.2 ml 9.58 ml/m LA Biplane Vol: 24.4 ml 12.17 ml/m  AORTIC VALVE LVOT Vmax:   95.30 cm/s LVOT Vmean:  60.600 cm/s LVOT VTI:    0.157 m  AORTA Ao Root diam: 3.60 cm Ao Asc diam:  3.60 cm MITRAL VALVE MV Area (PHT): 2.39 cm    SHUNTS MV Decel Time: 317 msec    Systemic VTI:  0.16 m MV E velocity: 69.40 cm/s  Systemic Diam: 2.00 cm MV A velocity: 75.00 cm/s MV E/A ratio:  0.93 Fransico Him MD Electronically signed by Fransico Him MD Signature Date/Time: 10/30/2019/9:02:29 AM    Final    US THORACENTESIS ASP PLEURAL SPACE W/IMG GUIDE  Result Date: 10/30/2019 INDICATION: Patient with remote history of tobacco use, prostate cancer, hypertension, coronary artery disease; now with dyspnea, large left  pleural effusion. Request received for diagnostic and therapeutic left thoracentesis. EXAM: ULTRASOUND GUIDED DIAGNOSTIC AND THERAPEUTIC LEFT THORACENTESIS MEDICATIONS: 1% lidocaine to skin and subcutaneous tissue COMPLICATIONS: None immediate. PROCEDURE: An ultrasound guided thoracentesis was thoroughly discussed with the patient and questions answered. The benefits, risks, alternatives and complications were also discussed. The patient understands and wishes to proceed with the procedure. Written consent was obtained. Ultrasound was performed to localize and mark an adequate pocket of fluid in the left chest. The area was then prepped and draped in the normal sterile fashion. 1% Lidocaine was used for local anesthesia. Under ultrasound guidance a 6 Fr Safe-T-Centesis catheter was introduced. Thoracentesis was performed. The catheter was removed and a dressing applied. FINDINGS: A total of approximately 2.5 liters of hazy,amber fluid was removed. Samples were sent to the laboratory as requested by the clinical team. IMPRESSION: Successful ultrasound guided diagnostic and therapeutic left thoracentesis yielding 2.5 liters of pleural fluid. Read by: Rowe Robert, PA-C Electronically Signed   By: Jacqulynn Cadet M.D.   On: 10/30/2019 12:16     I have independently reviewed the above radiology studies  and reviewed the findings with the patient.  Cytology Left Effusion CYTOLOGY - NON PAP  CASE: [id number]  PATIENT: Jack Haynes  Non-Gynecological Cytology Report   Clinical History: Remote history of prostate cancer s/p prostatectomy  Specimen Submitted: A. PLEURAL FLUID, LEFT, THORACENTESIS:    FINAL MICROSCOPIC DIAGNOSIS:  - Atypical mesothelial cells present  - See comment   SPECIMEN ADEQUACY:  Satisfactory for evaluation   DIAGNOSTIC COMMENTS:  Immunohistochemical stains show that the atypical cells are positive for  calretinin and D2-40; and negative for TTF-1, CK7, CK20 and PSA,   consistent with mesothelial origin. The findings are concerning for  mesothelioma. Clinical and radiologic correlation and further  evaluation, including biopsy if appropriate, are suggested. Dr. Saralyn Pilar  reviewed the case and concurs with the diagnosis.    Recent Lab Findings: Lab Results  Component Value Date   WBC 7.5 11/26/2019   HGB 14.2 11/26/2019   HCT 42.8 11/26/2019   PLT 272 11/26/2019   GLUCOSE 136 (H) 11/23/2019   CHOL 184 09/24/2019   TRIG 94 09/24/2019   HDL 40 09/24/2019   LDLCALC 127 (H) 09/24/2019   ALT 55 (H) 11/23/2019   AST 22 11/23/2019   NA 135 11/23/2019   K 4.1 11/23/2019   CL 100 11/23/2019   CREATININE 1.02 11/23/2019   BUN 13 11/23/2019   CO2 30 11/23/2019   TSH 8.320 (H) 10/30/2019   INR 1.0 11/13/2019   HGBA1C 6.6 (H) 09/27/2019      Assessment / Plan:   #1 mesothelioma to start chemo early next week-here now for placement of Port-A-Cath and removal of Pleurx catheter     The goals risks and alternatives of the planned surgical procedure Procedure(s): INSERTION PORT-A-CATH (Bilateral) REMOVAL OF PLEURAL DRAINAGE CATHETER (Left)  have been discussed with the patient in detail. The risks of the procedure including death, infection, stroke, myocardial infarction, bleeding, blood transfusion, pneumothorax have all been discussed specifically.  I have quoted Robie Ridge a <1 % of perioperative mortality and a complication rate as high as 10 %. The patient's questions have been answered.Jack Haynes is willing  to proceed with the planned procedure.    Grace Isaac MD      [address] [place] [address] Office [phone]     11/26/2019 7:12 AM

## 2019-11-26 NOTE — Discharge Instructions (Signed)
Implanted Port Insertion, Care After This sheet gives you information about how to care for yourself after your procedure. Your health care provider may also give you more specific instructions. If you have problems or questions, contact your health care provider. What can I expect after the procedure? After the procedure, it is common to have:  Discomfort at the port insertion site.  Bruising on the skin over the port. This should improve over 3-4 days. Follow these instructions at home: Port care  After your port is placed, you will get a manufacturer's information card. The card has information about your port. Keep this card with you at all times.  Take care of the port as told by your health care provider. Ask your health care provider if you or a family member can get training for taking care of the port at home. A home health care nurse may also take care of the port.  Make sure to remember what type of port you have. Incision care      Follow instructions from your health care provider about how to take care of your port insertion site. Make sure you: ? Wash your hands with soap and water before and after you change your bandage (dressing). If soap and water are not available, use hand sanitizer. ? Change your dressing as told by your health care provider. ? Leave stitches (sutures), skin glue, or adhesive strips in place. These skin closures may need to stay in place for 2 weeks or longer. If adhesive strip edges start to loosen and curl up, you may trim the loose edges. Do not remove adhesive strips completely unless your health care provider tells you to do that.  Check your port insertion site every day for signs of infection. Check for: ? Redness, swelling, or pain. ? Fluid or blood. ? Warmth. ? Pus or a bad smell. Activity  Return to your normal activities as told by your health care provider. Ask your health care provider what activities are safe for you.  Do not  lift anything that is heavier than 10 lb (4.5 kg), or the limit that you are told, until your health care provider says that it is safe. General instructions  Take over-the-counter and prescription medicines only as told by your health care provider.  Do not take baths, swim, or use a hot tub until your health care provider approves. Ask your health care provider if you may take showers. You may only be allowed to take sponge baths.  Do not drive for 24 hours if you were given a sedative during your procedure.  Wear a medical alert bracelet in case of an emergency. This will tell any health care providers that you have a port.  Keep all follow-up visits as told by your health care provider. This is important. Contact a health care provider if:  You cannot flush your port with saline as directed, or you cannot draw blood from the port.  You have a fever or chills.  You have redness, swelling, or pain around your port insertion site.  You have fluid or blood coming from your port insertion site.  Your port insertion site feels warm to the touch.  You have pus or a bad smell coming from the port insertion site. Get help right away if:  You have chest pain or shortness of breath.  You have bleeding from your port that you cannot control. Summary  Take care of the port as told by your health   care provider. Keep the manufacturer's information card with you at all times.  Change your dressing as told by your health care provider.  Contact a health care provider if you have a fever or chills or if you have redness, swelling, or pain around your port insertion site.  Keep all follow-up visits as told by your health care provider. This information is not intended to replace advice given to you by your health care provider. Make sure you discuss any questions you have with your health care provider. Document Revised: 08/30/2017 Document Reviewed: 08/30/2017 Elsevier Patient Education   2020 [employer] Implanted Port Home Guide An implanted port is a device that is placed under the skin. It is usually placed in the chest. The device can be used to give IV medicine, to take blood, or for dialysis. You may have an implanted port if:  You need IV medicine that would be irritating to the small veins in your hands or arms.  You need IV medicines, such as antibiotics, for a long period of time.  You need IV nutrition for a long period of time.  You need dialysis. Having a port means that your health care provider will not need to use the veins in your arms for these procedures. You may have fewer limitations when using a port than you would if you used other types of long-term IVs, and you will likely be able to return to normal activities after your incision heals. An implanted port has two main parts:  Reservoir. The reservoir is the part where a needle is inserted to give medicines or draw blood. The reservoir is round. After it is placed, it appears as a small, raised area under your skin.  Catheter. The catheter is a thin, flexible tube that connects the reservoir to a vein. Medicine that is inserted into the reservoir goes into the catheter and then into the vein. How is my port accessed? To access your port:  A numbing cream may be placed on the skin over the port site.  Your health care provider will put on a mask and sterile gloves.  The skin over your port will be cleaned carefully with a germ-killing soap and allowed to dry.  Your health care provider will gently pinch the port and insert a needle into it.  Your health care provider will check for a blood return to make sure the port is in the vein and is not clogged.  If your port needs to remain accessed to get medicine continuously (constant infusion), your health care provider will place a clear bandage (dressing) over the needle site. The dressing and needle will need to be changed every week, or as told  by your health care provider. What is flushing? Flushing helps keep the port from getting clogged. Follow instructions from your health care provider about how and when to flush the port. Ports are usually flushed with saline solution or a medicine called heparin. The need for flushing will depend on how the port is used:  If the port is only used from time to time to give medicines or draw blood, the port may need to be flushed: ? Before and after medicines have been given. ? Before and after blood has been drawn. ? As part of routine maintenance. Flushing may be recommended every 4-6 weeks.  If a constant infusion is running, the port may not need to be flushed.  Throw away any syringes in a disposal container that is meant   for sharp items (sharps container). You can buy a sharps container from a pharmacy, or you can make one by using an empty hard plastic bottle with a cover. How long will my port stay implanted? The port can stay in for as long as your health care provider thinks it is needed. When it is time for the port to come out, a surgery will be done to remove it. The surgery will be similar to the procedure that was done to put the port in. Follow these instructions at home:   Flush your port as told by your health care provider.  If you need an infusion over several days, follow instructions from your health care provider about how to take care of your port site. Make sure you: ? Wash your hands with soap and water before you change your dressing. If soap and water are not available, use alcohol-based hand sanitizer. ? Change your dressing as told by your health care provider. ? Place any used dressings or infusion bags into a plastic bag. Throw that bag in the trash. ? Keep the dressing that covers the needle clean and dry. Do not get it wet. ? Do not use scissors or sharp objects near the tube. ? Keep the tube clamped, unless it is being used.  Check your port site every day  for signs of infection. Check for: ? Redness, swelling, or pain. ? Fluid or blood. ? Pus or a bad smell.  Protect the skin around the port site. ? Avoid wearing bra straps that rub or irritate the site. ? Protect the skin around your port from seat belts. Place a soft pad over your chest if needed.  Bathe or shower as told by your health care provider. The site may get wet as long as you are not actively receiving an infusion.  Return to your normal activities as told by your health care provider. Ask your health care provider what activities are safe for you.  Carry a medical alert card or wear a medical alert bracelet at all times. This will let health care providers know that you have an implanted port in case of an emergency. Get help right away if:  You have redness, swelling, or pain at the port site.  You have fluid or blood coming from your port site.  You have pus or a bad smell coming from the port site.  You have a fever. Summary  Implanted ports are usually placed in the chest for long-term IV access.  Follow instructions from your health care provider about flushing the port and changing bandages (dressings).  Take care of the area around your port by avoiding clothing that puts pressure on the area, and by watching for signs of infection.  Protect the skin around your port from seat belts. Place a soft pad over your chest if needed.  Get help right away if you have a fever or you have redness, swelling, pain, drainage, or a bad smell at the port site. This information is not intended to replace advice given to you by your health care provider. Make sure you discuss any questions you have with your health care provider. Document Revised: 05/26/2018 Document Reviewed: 03/06/2016 Elsevier Patient Education  2020 Elsevier Inc.  

## 2019-11-26 NOTE — Anesthesia Procedure Notes (Signed)
Procedure Name: MAC Date/Time: 11/26/2019 7:30 AM Performed by: Janene Harvey, CRNA Pre-anesthesia Checklist: Patient identified, Emergency Drugs available, Suction available and Patient being monitored Patient Re-evaluated:Patient Re-evaluated prior to induction Oxygen Delivery Method: Simple face mask Placement Confirmation: positive ETCO2 Dental Injury: Teeth and Oropharynx as per pre-operative assessment

## 2019-11-26 NOTE — Brief Op Note (Signed)
      [address]       [place] [address]             [phone]    11/26/2019  8:37 AM  PATIENT:  Robie Ridge  79 y.o. male  PRE-OPERATIVE DIAGNOSIS:  MALIGNANT PLEURAL MESOTHELIOMA RESOLVED LEFT PLEURAL EFFUSION  POST-OPERATIVE DIAGNOSIS:  MALIGNANT PLEURAL MESOTHELIOMA RESOLVED LEFT PLEURAL EFFUSION  PROCEDURE:  Procedure(s): INSERTION PORT-A-CATH (Left) REMOVAL OF PLEURAL DRAINAGE CATHETER (Left)  SURGEON:  Surgeon(s) and Role:    * Grace Isaac, MD - Primary   ANESTHESIA:   MAC  EBL:  None   BLOOD ADMINISTERED:none  DRAINS: port placement  LOCAL MEDICATIONS USED:  XYLOCAINE  and Amount: 10 ml ml   COUNTS:  YES   DICTATION: .Dragon Dictation  PLAN OF CARE: Discharge to home after PACU  PATIENT DISPOSITION:  PACU - hemodynamically stable.   Delay start of Pharmacological VTE agent (>24hrs) due to surgical blood loss or risk of bleeding: yes

## 2019-11-26 NOTE — Anesthesia Postprocedure Evaluation (Signed)
Anesthesia Post Note  Patient: Jack Haynes  Procedure(s) Performed: INSERTION PORT-A-CATH BARD POWERPORT 9.6 FR CATHETER (Left Chest) REMOVAL OF PLEURAL DRAINAGE CATHETER (Left )     Patient location during evaluation: PACU Anesthesia Type: MAC Level of consciousness: awake and alert Pain management: pain level controlled Vital Signs Assessment: post-procedure vital signs reviewed and stable Respiratory status: spontaneous breathing and respiratory function stable Cardiovascular status: stable Postop Assessment: no apparent nausea or vomiting Anesthetic complications: no   No complications documented.  Last Vitals:  Vitals:   11/26/19 0900 11/26/19 0911  BP:  131/90  Pulse: 79 79  Resp: 17 17  Temp:    SpO2: 98% 95%    Last Pain:  Vitals:   11/26/19 0911  TempSrc:   PainSc: 0-No pain                 Tedd Cottrill DANIEL

## 2019-11-26 NOTE — Progress Notes (Signed)
I contacted pathology dept to get an update on PDL 1 status.

## 2019-11-27 ENCOUNTER — Encounter (HOSPITAL_COMMUNITY): Payer: Self-pay | Admitting: Cardiothoracic Surgery

## 2019-11-27 NOTE — Op Note (Signed)
Jack Haynes, Jack Haynes MEDICAL RECORD [id number] ACCOUNT [id number] DATE OF BIRTH:1940-07-21 FACILITY: [facility] LOCATION: [facility] PHYSICIAN:Fadel Clason Maryruth Bun, MD  OPERATIVE REPORT  DATE OF PROCEDURE:  11/26/2019  PREOPERATIVE DIAGNOSIS:  Need for vascular access for chemotherapy with new diagnosis of mesothelioma.  POSTOPERATIVE DIAGNOSIS:  Need for vascular access for chemotherapy with new diagnosis of mesothelioma.  SURGICAL PROCEDURE: 1.  Placement of left subclavian vein Port-A-Cath with fluoroscopy and ultrasound guidance. 2.  Removal of left PleurX catheter.  SURGEON:  Lanelle Bal, MD  BRIEF HISTORY:  The patient is a 79 year old male who was recently diagnosed with recurrent left pleural effusions.  Further evaluation including video-assisted thoracoscopy with pleural biopsies, talc pleurodesis and placement of left PleurX catheter  was done.  Final path confirmed mesothelioma.  The patient is to start chemotherapy and is referred by Dr. Julien Nordmann for placement of Port-A-Cath.  The patient noted recently that the PleurX catheter was no longer draining and was deemed ready to be  removed.  DESCRIPTION OF PROCEDURE:  With MAC anesthesia, the patient was placed in supine position.  The neck bilaterally and upper chest was prepped with Betadine, draped in the usual sterile manner.  Appropriate timeout was performed.  Using SonoSite, the left  subclavian vein was visualized.  Lidocaine 1% was infiltrated in the left infraclavicular area and across the anterior chest.  Using ultrasound guidance and 2 sticks with a 16-gauge needle, the guidewire under fluoroscopic guidance was placed into the  superior vena cava.  With the vein cannulated, we then made a counter incision over the anterior chest.  A subcutaneous pocket was created, 9.5 [place] PowerPort preattached Port-A-Cath was selected.  The reservoir was flushed with heparinized saline.   The catheter was tunneled from the pocket  to the insertion site of the wire.  Under fluoroscopic guidance, a peel-away sheath was fed over the previously placed guidewire and positioned into the superior vena cava.  The Port-A-Cath had been trimmed to  appropriate length and this was then placed through the Port-A-Cath and the sheath removed.  Fluoroscopy showed good position of the catheter.  There was easy blood return.  The catheter was flushed with heparinized saline and 2.5 mL of 1000 units per mL  heparin was instilled in the reservoir.  The reservoir was tacked in place.  The incision was closed with interrupted 3-0 Vicryl and 4-0 subcuticular stitch.  Dermabond was applied.  With the catheter in place, we then removed the drapes, reprepped the  lower left chest at the insertion site of the Port-A-Cath, cleaned this area with Betadine, 1% lidocaine, approximately 8 mL was infiltrated around the insertion site.  Insertion site was mildly dilated and the cloth cuff on the PleurX was freed.  The  PleurX was removed in its entirety.  A single absorbable suture was placed in the tract.  Occlusive dressing was placed on the site.  The sponge and needle count was reported as correct on both procedures.  The patient was then transferred to the  recovery room for postoperative care.  Blood loss was nil.  The patient tolerated the procedure without obvious complication.  He will obtain a PA and lateral chest x-ray postoperatively before discharge home.  HN/NUANCE  D:11/27/2019 T:11/27/2019 JOB:[id number]

## 2019-11-28 ENCOUNTER — Telehealth: Payer: Self-pay | Admitting: *Deleted

## 2019-11-28 DIAGNOSIS — Z6830 Body mass index (BMI) 30.0-30.9, adult: Secondary | ICD-10-CM | POA: Diagnosis not present

## 2019-11-28 DIAGNOSIS — C3492 Malignant neoplasm of unspecified part of left bronchus or lung: Secondary | ICD-10-CM | POA: Diagnosis not present

## 2019-11-28 DIAGNOSIS — Z4801 Encounter for change or removal of surgical wound dressing: Secondary | ICD-10-CM | POA: Diagnosis not present

## 2019-11-28 DIAGNOSIS — R634 Abnormal weight loss: Secondary | ICD-10-CM | POA: Diagnosis not present

## 2019-11-28 DIAGNOSIS — Z483 Aftercare following surgery for neoplasm: Secondary | ICD-10-CM | POA: Diagnosis not present

## 2019-11-28 DIAGNOSIS — J90 Pleural effusion, not elsewhere classified: Secondary | ICD-10-CM | POA: Diagnosis not present

## 2019-11-28 DIAGNOSIS — Z48813 Encounter for surgical aftercare following surgery on the respiratory system: Secondary | ICD-10-CM | POA: Diagnosis not present

## 2019-11-28 DIAGNOSIS — I119 Hypertensive heart disease without heart failure: Secondary | ICD-10-CM | POA: Diagnosis not present

## 2019-11-28 DIAGNOSIS — I2510 Atherosclerotic heart disease of native coronary artery without angina pectoris: Secondary | ICD-10-CM | POA: Diagnosis not present

## 2019-11-28 LAB — FUNGUS CULTURE WITH STAIN

## 2019-11-28 LAB — FUNGUS CULTURE RESULT

## 2019-11-28 LAB — FUNGAL ORGANISM REFLEX

## 2019-11-28 NOTE — Telephone Encounter (Signed)
Faxed plan of care to [facility] Attn: Stanford Scotland. Confirmation page 11:52 am.

## 2019-11-29 ENCOUNTER — Encounter (HOSPITAL_COMMUNITY): Payer: Self-pay

## 2019-11-29 ENCOUNTER — Inpatient Hospital Stay: Payer: Medicare PPO

## 2019-11-29 ENCOUNTER — Ambulatory Visit (HOSPITAL_COMMUNITY)
Admission: RE | Admit: 2019-11-29 | Discharge: 2019-11-29 | Disposition: A | Discharge status: Home / Self Care | Payer: Medicare PPO | Source: Ambulatory Visit | Attending: Cardiothoracic Surgery | Admitting: Cardiothoracic Surgery

## 2019-11-29 ENCOUNTER — Other Ambulatory Visit: Payer: Self-pay

## 2019-11-29 DIAGNOSIS — I700 Atherosclerosis of aorta: Secondary | ICD-10-CM | POA: Insufficient documentation

## 2019-11-29 DIAGNOSIS — K5730 Diverticulosis of large intestine without perforation or abscess without bleeding: Secondary | ICD-10-CM | POA: Diagnosis not present

## 2019-11-29 DIAGNOSIS — C3490 Malignant neoplasm of unspecified part of unspecified bronchus or lung: Secondary | ICD-10-CM

## 2019-11-29 LAB — GLUCOSE, CAPILLARY: Glucose-Capillary: 122 mg/dL — ABNORMAL HIGH (ref 70–99)

## 2019-11-29 MED ORDER — FLUDEOXYGLUCOSE F - 18 (FDG) INJECTION
9.2500 | Freq: Once | INTRAVENOUS | Status: AC | PRN
  Administered 2019-11-29: 9.25 via INTRAVENOUS

## 2019-12-03 ENCOUNTER — Encounter: Payer: Self-pay | Admitting: Emergency Medicine

## 2019-12-03 DIAGNOSIS — H04123 Dry eye syndrome of bilateral lacrimal glands: Secondary | ICD-10-CM | POA: Diagnosis not present

## 2019-12-03 DIAGNOSIS — H40023 Open angle with borderline findings, high risk, bilateral: Secondary | ICD-10-CM | POA: Diagnosis not present

## 2019-12-03 DIAGNOSIS — H2513 Age-related nuclear cataract, bilateral: Secondary | ICD-10-CM | POA: Diagnosis not present

## 2019-12-03 DIAGNOSIS — H25013 Cortical age-related cataract, bilateral: Secondary | ICD-10-CM | POA: Diagnosis not present

## 2019-12-05 ENCOUNTER — Encounter: Payer: Self-pay | Admitting: Internal Medicine

## 2019-12-05 NOTE — Progress Notes (Signed)
Called pt to introduce myself as his Arboriculturist.  Unfortunately there aren't any foundations offering copay assistance for his Dx and the type of ins he has.  I informed him of the [employer], went over what it covers and gave him the income requirement.  Pt would like to apply so he will bring proof of income on 12/06/19.  If approved I will give him an expense sheet and my card for any questions or concerns he may have in the future.

## 2019-12-06 ENCOUNTER — Inpatient Hospital Stay: Payer: Medicare PPO

## 2019-12-06 ENCOUNTER — Other Ambulatory Visit: Payer: Self-pay

## 2019-12-06 ENCOUNTER — Encounter: Payer: Self-pay | Admitting: Internal Medicine

## 2019-12-06 ENCOUNTER — Inpatient Hospital Stay (HOSPITAL_BASED_OUTPATIENT_CLINIC_OR_DEPARTMENT_OTHER): Payer: Medicare PPO | Admitting: Internal Medicine

## 2019-12-06 ENCOUNTER — Telehealth: Payer: Self-pay | Admitting: *Deleted

## 2019-12-06 ENCOUNTER — Ambulatory Visit: Payer: Self-pay | Admitting: Cardiothoracic Surgery

## 2019-12-06 VITALS — BP 149/94 | HR 77 | Temp 97.4°F | Resp 18 | Ht 67.0 in | Wt 186.0 lb

## 2019-12-06 DIAGNOSIS — C459 Mesothelioma, unspecified: Secondary | ICD-10-CM | POA: Diagnosis not present

## 2019-12-06 DIAGNOSIS — Z8546 Personal history of malignant neoplasm of prostate: Secondary | ICD-10-CM | POA: Diagnosis not present

## 2019-12-06 DIAGNOSIS — Z5111 Encounter for antineoplastic chemotherapy: Secondary | ICD-10-CM

## 2019-12-06 DIAGNOSIS — M47814 Spondylosis without myelopathy or radiculopathy, thoracic region: Secondary | ICD-10-CM | POA: Diagnosis not present

## 2019-12-06 DIAGNOSIS — J90 Pleural effusion, not elsewhere classified: Secondary | ICD-10-CM | POA: Diagnosis not present

## 2019-12-06 DIAGNOSIS — I6782 Cerebral ischemia: Secondary | ICD-10-CM | POA: Diagnosis not present

## 2019-12-06 DIAGNOSIS — C457 Mesothelioma of other sites: Secondary | ICD-10-CM | POA: Diagnosis not present

## 2019-12-06 DIAGNOSIS — Z809 Family history of malignant neoplasm, unspecified: Secondary | ICD-10-CM | POA: Diagnosis not present

## 2019-12-06 DIAGNOSIS — I119 Hypertensive heart disease without heart failure: Secondary | ICD-10-CM | POA: Diagnosis not present

## 2019-12-06 DIAGNOSIS — Z23 Encounter for immunization: Secondary | ICD-10-CM | POA: Diagnosis not present

## 2019-12-06 LAB — CMP (CANCER CENTER ONLY)
ALT: 16 U/L (ref 0–44)
AST: 11 U/L — ABNORMAL LOW (ref 15–41)
Albumin: 3.6 g/dL (ref 3.5–5.0)
Alkaline Phosphatase: 82 U/L (ref 38–126)
Anion gap: 10 (ref 5–15)
BUN: 16 mg/dL (ref 8–23)
CO2: 21 mmol/L — ABNORMAL LOW (ref 22–32)
Calcium: 9.6 mg/dL (ref 8.9–10.3)
Chloride: 102 mmol/L (ref 98–111)
Creatinine: 0.92 mg/dL (ref 0.61–1.24)
GFR, Estimated: >60 mL/min (ref >60)
Glucose, Bld: 216 mg/dL — ABNORMAL HIGH (ref 70–99)
Potassium: 4.3 mmol/L (ref 3.5–5.1)
Sodium: 133 mmol/L — ABNORMAL LOW (ref 135–145)
Total Bilirubin: 0.4 mg/dL (ref 0.3–1.2)
Total Protein: 7.5 g/dL (ref 6.5–8.1)

## 2019-12-06 LAB — CBC WITH DIFFERENTIAL (CANCER CENTER ONLY)
Abs Immature Granulocytes: 0.05 10*3/uL (ref 0.00–0.07)
Basophils Absolute: 0 10*3/uL (ref 0.0–0.1)
Basophils Relative: 0 %
Eosinophils Absolute: 0 10*3/uL (ref 0.0–0.5)
Eosinophils Relative: 0 %
HCT: 41.5 % (ref 39.0–52.0)
Hemoglobin: 14.4 g/dL (ref 13.0–17.0)
Immature Granulocytes: 1 %
Lymphocytes Relative: 10 %
Lymphs Abs: 0.9 10*3/uL (ref 0.7–4.0)
MCH: 30.2 pg (ref 26.0–34.0)
MCHC: 34.7 g/dL (ref 30.0–36.0)
MCV: 87 fL (ref 80.0–100.0)
Monocytes Absolute: 0.5 10*3/uL (ref 0.1–1.0)
Monocytes Relative: 6 %
Neutro Abs: 7.6 10*3/uL (ref 1.7–7.7)
Neutrophils Relative %: 83 %
Platelet Count: 290 10*3/uL (ref 150–400)
RBC: 4.77 MIL/uL (ref 4.22–5.81)
RDW: 11.3 % — ABNORMAL LOW (ref 11.5–15.5)
WBC Count: 9.1 10*3/uL (ref 4.0–10.5)
nRBC: 0 % (ref 0.0–0.2)

## 2019-12-06 LAB — TOTAL PROTEIN, URINE DIPSTICK: Protein, ur: <30 mg/dL

## 2019-12-06 MED ORDER — SODIUM CHLORIDE 0.9 % IV SOLN
Freq: Once | INTRAVENOUS | Status: AC
  Filled 2019-12-06: qty 250

## 2019-12-06 MED ORDER — SODIUM CHLORIDE 0.9% FLUSH
10.0000 mL | INTRAVENOUS | Status: DC | PRN
  Administered 2019-12-06: 10 mL
  Filled 2019-12-06: qty 10

## 2019-12-06 MED ORDER — PALONOSETRON HCL INJECTION 0.25 MG/5ML
0.2500 mg | Freq: Once | INTRAVENOUS | Status: AC
  Administered 2019-12-06: 0.25 mg via INTRAVENOUS

## 2019-12-06 MED ORDER — PALONOSETRON HCL INJECTION 0.25 MG/5ML
INTRAVENOUS | Status: AC
  Filled 2019-12-06: qty 5

## 2019-12-06 MED ORDER — HEPARIN SOD (PORK) LOCK FLUSH 100 UNIT/ML IV SOLN
500.0000 [IU] | Freq: Once | INTRAVENOUS | Status: AC | PRN
  Administered 2019-12-06: 500 [IU]
  Filled 2019-12-06: qty 5

## 2019-12-06 MED ORDER — SODIUM CHLORIDE 0.9 % IV SOLN
500.0000 mg/m2 | Freq: Once | INTRAVENOUS | Status: AC
  Administered 2019-12-06: 1000 mg via INTRAVENOUS
  Filled 2019-12-06: qty 40

## 2019-12-06 MED ORDER — SODIUM CHLORIDE 0.9 % IV SOLN
480.5000 mg | Freq: Once | INTRAVENOUS | Status: AC
  Administered 2019-12-06: 480 mg via INTRAVENOUS
  Filled 2019-12-06: qty 48

## 2019-12-06 MED ORDER — SODIUM CHLORIDE 0.9 % IV SOLN
150.0000 mg | Freq: Once | INTRAVENOUS | Status: AC
  Administered 2019-12-06: 150 mg via INTRAVENOUS
  Filled 2019-12-06: qty 150

## 2019-12-06 MED ORDER — SODIUM CHLORIDE 0.9 % IV SOLN
10.0000 mg | Freq: Once | INTRAVENOUS | Status: AC
  Administered 2019-12-06: 10 mg via INTRAVENOUS
  Filled 2019-12-06: qty 10

## 2019-12-06 MED ORDER — SODIUM CHLORIDE 0.9 % IV SOLN
15.0000 mg/kg | Freq: Once | INTRAVENOUS | Status: AC
  Administered 2019-12-06: 1300 mg via INTRAVENOUS
  Filled 2019-12-06: qty 48

## 2019-12-06 NOTE — Telephone Encounter (Signed)
-----   Message from Jolaine Click, RN sent at 12/06/2019 11:25 AM EDT ----- Regarding: First Time Avastin, Alimta, Carboplatin - Dr. Julien Nordmann Patient First Time Avastin, Alimta, Carboplatin - Dr. Julien Nordmann Patient

## 2019-12-06 NOTE — Patient Instructions (Signed)
[facility] Discharge Instructions for Patients Receiving Chemotherapy  Today you received the following chemotherapy agents: Bevacizumab (Avastin), Pemetrexed (Alimta), and Carboplatin  To help prevent nausea and vomiting after your treatment, we encourage you to take your nausea medication  as prescribed.  If you develop nausea and vomiting that is not controlled by your nausea medication, call the clinic.   BELOW ARE SYMPTOMS THAT SHOULD BE REPORTED IMMEDIATELY:  *FEVER GREATER THAN 100.5 F  *CHILLS WITH OR WITHOUT FEVER  NAUSEA AND VOMITING THAT IS NOT CONTROLLED WITH YOUR NAUSEA MEDICATION  *UNUSUAL SHORTNESS OF BREATH  *UNUSUAL BRUISING OR BLEEDING  TENDERNESS IN MOUTH AND THROAT WITH OR WITHOUT PRESENCE OF ULCERS  *URINARY PROBLEMS  *BOWEL PROBLEMS  UNUSUAL RASH Items with * indicate a potential emergency and should be followed up as soon as possible.  Feel free to call the clinic should you have any questions or concerns. The clinic phone number is [phone].  Please show the [facility] at check-in to the Emergency Department and triage nurse.

## 2019-12-06 NOTE — Progress Notes (Signed)
Pt is approved for the $1000 [employer] grant.  

## 2019-12-06 NOTE — Telephone Encounter (Signed)
TCT patient and spoke with him after his chemo therapy. He states he did well today. Ate a good lunch, went for a walk and is enjoying to outdoors.  He knows to drink his fluids and ate well.  He is aware of the phone # to call with any questions or concerns.

## 2019-12-06 NOTE — Progress Notes (Signed)
[place] [phone]   [phone]  OFFICE PROGRESS NOTE  [name], MD [address] [place] [address]  DIAGNOSIS: Stage I/II malignant pleural mesothelioma involving the left hemithorax diagnosed in September 2021.  PRIOR THERAPY: None.  CURRENT THERAPY:  Carboplatin for AUC of 5, Alimta 500 mg/M2 and Avastin 15 mg/KG every 3 weeks.   INTERVAL HISTORY: Jack Haynes 79 y.o. male returns to the clinic today for follow-up visit accompanied by his wife.  The patient is feeling fine today with no concerning complaints.  He still very active.  He denied having any current chest pain but has shortness of breath with exertion with mild cough and no hemoptysis.  He has no nausea, vomiting, diarrhea or constipation.  He denied having any headache or visual changes.  He had a PET scan as well as MRI of the brain recently that showed no evidence of metastatic disease outside the left side of the chest.  The patient is here today for evaluation before starting the first cycle of his treatment with chemotherapy.  MEDICAL HISTORY: Past Medical History:  Diagnosis Date   Arthritis    Cancer ([place])    hx of prostatae cancer   Dyspnea    until chest tube placed   Hypertension    Mesothelioma [facility])     ALLERGIES:  has No Known Allergies.  MEDICATIONS:  Current Outpatient Medications  Medication Sig Dispense Refill   acetaminophen (TYLENOL) 500 MG tablet Take 2 tablets (1,000 mg total) by mouth every 6 (six) hours as needed for mild pain or fever. 30 tablet 0   aspirin EC 81 MG tablet Take 81 mg by mouth See admin instructions. Take one tablet (81 mg) by mouth twice a week at night (Patient not taking: Reported on 11/23/2019)     carboxymethylcellulose (REFRESH PLUS) 0.5 % SOLN Place 1 drop into the left eye daily as needed (dry eyes/irritation).      dexamethasone (DECADRON) 4 MG tablet 1 tablet p.o. twice daily the day before,  day of and day after chemotherapy every 3 weeks 40 tablet 1   folic acid (FOLVITE) 1 MG tablet Take 1 tablet (1 mg total) by mouth daily. 30 tablet 4   lidocaine-prilocaine (EMLA) cream Apply to Port-A-Cath site 30-60-minute before treatment. 30 g 0   lisinopril (ZESTRIL) 20 MG tablet Take 1 tablet (20 mg total) by mouth daily. 90 tablet 3   Misc Natural Products (NEURIVA PO) Take 1 capsule by mouth daily.      naproxen sodium (ALEVE) 220 MG tablet Take 220 mg by mouth daily as needed (pain/headache). (Patient not taking: Reported on 11/23/2019)     prochlorperazine (COMPAZINE) 10 MG tablet Take 1 tablet (10 mg total) by mouth every 6 (six) hours as needed for nausea or vomiting. 30 tablet 0   rosuvastatin (CRESTOR) 10 MG tablet Take 1 tablet (10 mg total) by mouth daily. 90 tablet 3   traMADol (ULTRAM) 50 MG tablet Take 1-2 tablets (50-100 mg total) by mouth every 6 (six) hours as needed (mild pain). 30 tablet 0   No current facility-administered medications for this visit.    SURGICAL HISTORY:  Past Surgical History:  Procedure Laterality Date   APPENDECTOMY     BACK SURGERY  1998   disectomy   CHEST TUBE INSERTION Left 11/13/2019   Procedure: INSERTION PLEURAL DRAINAGE CATHETER;  Surgeon: Grace Isaac, MD;  Location: [place];  Service: Thoracic;  Laterality: Left;   EYE SURGERY Left 1965   injury   JOINT REPLACEMENT Left 2010   left knee replacement   KNEE SURGERY     PLEURAL BIOPSY Left 11/13/2019   Procedure: PLEURAL BIOPSY;  Surgeon: Grace Isaac, MD;  Location: [facility] OR;  Service: Thoracic;  Laterality: Left;   PORTACATH PLACEMENT Left 11/26/2019   Procedure: INSERTION PORT-A-CATH BARD POWERPORT 9.6 FR CATHETER;  Surgeon: Grace Isaac, MD;  Location: [place];  Service: Thoracic;  Laterality: Left;   PROSTATE SURGERY     REMOVAL OF PLEURAL DRAINAGE CATHETER Left 11/26/2019   Procedure: REMOVAL OF PLEURAL DRAINAGE CATHETER;  Surgeon: Grace Isaac,  MD;  Location: [place];  Service: Thoracic;  Laterality: Left;   SPINE SURGERY     TALC PLEURODESIS Left 11/13/2019   Procedure: possible TALC PLEURADESIS;  Surgeon: Grace Isaac, MD;  Location: [place];  Service: Thoracic;  Laterality: Left;   TOTAL KNEE ARTHROPLASTY  02/01/2012   Procedure: TOTAL KNEE ARTHROPLASTY;  Surgeon: Sydnee Cabal, MD;  Location: [facility];  Service: Orthopedics;  Laterality: Left;   VIDEO ASSISTED THORACOSCOPY Left 11/13/2019   Procedure: VIDEO ASSISTED THORACOSCOPY;  Surgeon: Grace Isaac, MD;  Location: [place];  Service: Thoracic;  Laterality: Left;   VIDEO BRONCHOSCOPY N/A 11/13/2019   Procedure: VIDEO BRONCHOSCOPY;  Surgeon: Grace Isaac, MD;  Location: [place];  Service: Thoracic;  Laterality: N/A;    REVIEW OF SYSTEMS:  Constitutional: negative Eyes: negative Ears, nose, mouth, throat, and face: negative Respiratory: positive for dyspnea on exertion Cardiovascular: negative Gastrointestinal: negative Genitourinary:negative Integument/breast: negative Hematologic/lymphatic: negative Musculoskeletal:negative Neurological: negative Behavioral/Psych: negative Endocrine: negative Allergic/Immunologic: negative   PHYSICAL EXAMINATION: General appearance: alert, cooperative and no distress Head: Normocephalic, without obvious abnormality, atraumatic Neck: no adenopathy, no JVD, supple, symmetrical, trachea midline and thyroid not enlarged, symmetric, no tenderness/mass/nodules Lymph nodes: Cervical, supraclavicular, and axillary nodes normal. Resp: clear to auscultation bilaterally Back: symmetric, no curvature. ROM normal. No CVA tenderness. Cardio: regular rate and rhythm, S1, S2 normal, no murmur, click, rub or gallop GI: soft, non-tender; bowel sounds normal; no masses,  no organomegaly Extremities: extremities normal, atraumatic, no cyanosis or edema Neurologic: Alert and oriented X 3, normal strength and tone. Normal symmetric reflexes.  Normal coordination and gait  ECOG PERFORMANCE STATUS: 1 - Symptomatic but completely ambulatory  Blood pressure (!) 149/94, pulse 77, temperature (!) 97.4 F (36.3 C), temperature source Tympanic, resp. rate 18, height 5\' 7"  (1.702 m), weight 186 lb (84.4 kg), SpO2 99 %.  LABORATORY DATA: Lab Results  Component Value Date   WBC 9.1 12/06/2019   HGB 14.4 12/06/2019   HCT 41.5 12/06/2019   MCV 87.0 12/06/2019   PLT 290 12/06/2019      Chemistry      Component Value Date/Time   NA 135 11/26/2019 0651   NA 141 09/24/2019 0832   K 4.1 11/26/2019 0651   CL 102 11/26/2019 0651   CO2 23 11/26/2019 0651   BUN 11 11/26/2019 0651   BUN 17 09/24/2019 0832   CREATININE 0.90 11/26/2019 0651   CREATININE 1.02 11/23/2019 0845   CREATININE 1.01 12/24/2014 0916      Component Value Date/Time   CALCIUM 8.7 (L) 11/26/2019 0651   ALKPHOS 69 11/26/2019 0651   AST 17 11/26/2019 0651   AST 22 11/23/2019 0845   ALT 34 11/26/2019 0651   ALT 55 (H) 11/23/2019 0845   BILITOT 0.4 11/26/2019 0651   BILITOT 0.4 11/23/2019  3382       RADIOGRAPHIC STUDIES: DG Chest 2 View  Result Date: 11/26/2019 CLINICAL DATA:  Port-A-Cath placement. EXAM: CHEST - 2 VIEW COMPARISON:  11/26/2019 FINDINGS: Left Port-A-Cath tip overlies the proximal SVC. No evidence for pneumothorax. Left pleural drain seen previously has been removed in the interval. There is persistent patchy opacity at the left base with tiny left pleural effusion. Right lung clear. Cardiopericardial silhouette is at upper limits of normal for size. The visualized bony structures of the thorax show no acute abnormality. IMPRESSION: Left Port-A-Cath tip overlies the proximal SVC. No evidence for pneumothorax. Electronically Signed   By: Misty Stanley M.D.   On: 11/26/2019 10:00   DG Chest 2 View  Result Date: 11/26/2019 CLINICAL DATA:  Mesothelioma. EXAM: CHEST - 2 VIEW COMPARISON:  11/15/2019 FINDINGS: Similar mild volume loss left hemithorax.  The diffuse pleural thickening in the left chest with nodular component at the apex has decreased in the interval. Left pleural drain remains in place posteriorly. Right lung clear. Cardiopericardial silhouette is at upper limits of normal for size. The visualized bony structures of the thorax show no acute abnormality. IMPRESSION: 1. Interval decrease in pleural thickening in the left chest with persistent left pleural drain. 2. No acute cardiopulmonary findings. Electronically Signed   By: Misty Stanley M.D.   On: 11/26/2019 06:28   DG Chest 2 View  Result Date: 11/15/2019 CLINICAL DATA:  Chest tube removal. EXAM: CHEST - 2 VIEW COMPARISON:  Same day. FINDINGS: Stable cardiomegaly. One left-sided chest tube has been removed. Remaining left-sided chest tube is noted in the lung base. No definite pneumothorax is noted. Stable nodular opacities are noted in the left upper lobe which may represent loculated effusions. Stable left basilar atelectasis and pleural thickening is noted. Minimal right basilar subsegmental atelectasis is noted. Bony thorax is unremarkable. IMPRESSION: One left-sided chest tube has been removed. Remaining left-sided chest tube is noted in the lung base. No definite pneumothorax seen. Stable nodular opacities in the left upper lobe which may represent loculated effusions. Electronically Signed   By: Marijo Conception M.D.   On: 11/15/2019 13:53   DG Chest 2 View  Result Date: 11/13/2019 CLINICAL DATA:  Preop exam. Left pleural effusion. Bronchoscopy today. EXAM: CHEST - 2 VIEW COMPARISON:  11/07/2019 FINDINGS: Interval increase in the size of a now moderate to large left pleural effusion. Overlying opacity. The right lung is clear. There is mild rightward mediastinal shift. Cardiac silhouette is largely obscured, but visualized portions appear similar to prior. No pneumothorax. Multilevel degenerative change of the thoracic spine with flowing anterior osteophytes compatible with diffuse  idiopathic skeletal hyperostosis (DISH). No acute osseous abnormality. IMPRESSION: Interval increase in the size of a now moderate to large left pleural effusion. Overlying opacities are favored to represent atelectasis/lung collapse. Electronically Signed   By: Margaretha Sheffield MD   On: 11/13/2019 07:50   DG Chest 2 View  Result Date: 11/08/2019 CLINICAL DATA:  Left pleural effusion EXAM: CHEST - 2 VIEW COMPARISON:  10/31/2019 FINDINGS: Increase in the left pleural effusion now moderate in size with further collapse/consolidation of the left lower lung. Stable right lung aeration. Heart is enlarged. No superimposed edema pattern or CHF. No pneumothorax. Trachea midline. Aorta atherosclerotic and degenerative changes of the spine. Nonobstructive bowel gas pattern. IMPRESSION: Enlarging now moderate sized left pleural effusion with further left lower lung collapse/consolidation. Aortic Atherosclerosis (ICD10-I70.0). Electronically Signed   By: Jerilynn Mages.  Shick M.D.   On: 11/08/2019 08:03  MR BRAIN W WO CONTRAST  Result Date: 11/27/2019 CLINICAL DATA:  Non-small cell lung cancer, staging. EXAM: MRI HEAD WITHOUT AND WITH CONTRAST TECHNIQUE: Multiplanar, multiecho pulse sequences of the brain and surrounding structures were obtained without and with intravenous contrast. CONTRAST:  4mL GADAVIST GADOBUTROL 1 MMOL/ML IV SOLN COMPARISON:  None. FINDINGS: Brain: No acute infarction, hemorrhage, hydrocephalus, extra-axial collection or mass lesion. Few scattered foci of T2 hyperintensity are seen within the white matter of the cerebral hemispheres, nonspecific, most likely related to chronic small vessel ischemia. Focus of susceptibility artifact in the left frontal lobe, likely hemosiderin deposits. No focus of abnormal contrast enhancement. Vascular: Normal flow voids. Skull and upper cervical spine: Normal marrow signal. Sinuses/Orbits: Mild scattered mucosal thickening throughout the paranasal sinuses with fluid  level within the right maxillary sinus. The orbits are maintained. Other: None. IMPRESSION: 1. No evidence of intracranial metastatic disease. 2. Mild chronic small vessel ischemia. 3. Paranasal sinus disease with fluid level within the right maxillary sinus, which may represent acute sinusitis in the appropriate clinical setting. Electronically Signed   By: Pedro Earls M.D.   On: 11/27/2019 09:44   NM PET Image Initial (PI) Skull Base To Thigh  Result Date: 11/29/2019 CLINICAL DATA:  Initial treatment strategy for non-small-cell lung cancer. EXAM: NUCLEAR MEDICINE PET SKULL BASE TO THIGH TECHNIQUE: 9.3 mCi F-18 FDG was injected intravenously. Full-ring PET imaging was performed from the skull base to thigh after the radiotracer. CT data was obtained and used for attenuation correction and anatomic localization. Fasting blood glucose: 122 mg/dl COMPARISON:  CT chest 10/30/2019. FINDINGS: Mediastinal blood pool activity: SUV max 2.5 Liver activity: SUV max NA NECK: No hypermetabolic lymph nodes in the neck. Incidental CT findings: none CHEST: Nodular pleural thickening in the left hemithorax is hypermetabolic with prominent hypermetabolic posteromedial nodule (image 84/4) demonstrating SUV max = 11.5. Pleura in the posterior costophrenic sulcus demonstrates SUV max = 7.4. No hypermetabolic lymphadenopathy in the mediastinum or right hilum. No hypermetabolic axillary lymphadenopathy. Incidental CT findings: Left Port-A-Cath tip is positioned in the proximal SVC. Coronary artery calcification is evident. High attenuation material in the left pleural space is compatible with pleurodesis. Small amount of pleural gas seen posteriorly towards the base likely related to the presence of a previous chest tube and pleurodesis. ABDOMEN/PELVIS: No abnormal hypermetabolic activity within the liver, pancreas, adrenal glands, or spleen. No hypermetabolic lymph nodes in the abdomen or pelvis. Incidental CT  findings: Diverticular changes noted left colon. Atherosclerotic calcification evident in the abdominal aorta without aneurysm. SKELETON: No focal hypermetabolic activity to suggest skeletal metastasis. Incidental CT findings: No worrisome lytic or sclerotic osseous abnormality. IMPRESSION: 1. Diffuse hypermetabolism in the nodular pleural thickening of the left hemithorax. No evidence for hypermetabolic hilar or mediastinal lymphadenopathy. 2. No hypermetabolic metastatic disease in the neck, abdomen, or pelvis. 3. Left colonic diverticulosis without diverticulitis. 4.  Aortic Atherosclerois (ICD10-170.0) Electronically Signed   By: Misty Stanley M.D.   On: 11/29/2019 11:40   DG CHEST PORT 1 VIEW  Result Date: 11/15/2019 CLINICAL DATA:  Pleural effusion EXAM: PORTABLE CHEST 1 VIEW COMPARISON:  11/14/2019 FINDINGS: Shallow inspiration. Cardiac enlargement. 2 left chest tubes remain in place without change in position. No visible left pneumothorax. Mild subcutaneous emphysema along the lateral chest. Nodular opacity in the left apex and along the left chest wall may represent a loculated effusion. Degenerative changes in the spine. IMPRESSION: 1. Cardiac enlargement. 2. Two left chest tubes remain in place. No visible left  pneumothorax. 3. Probable loculated fluid in the left apex and along the left chest wall. Electronically Signed   By: Lucienne Capers M.D.   On: 11/15/2019 05:48   DG CHEST PORT 1 VIEW  Result Date: 11/14/2019 CLINICAL DATA:  Follow-up PleurX catheter EXAM: PORTABLE CHEST 1 VIEW COMPARISON:  11/13/2019 FINDINGS: Cardiac shadow is mildly prominent but stable. Left-sided PleurX catheter is seen in satisfactory position. No pneumothorax is noted. No sizable effusion is seen. Minimal left basilar atelectasis is noted. Right lung remains clear. Degenerative changes of the thoracic spine are noted. IMPRESSION: PleurX catheter in place on the left. No pneumothorax or sizable effusion is seen.  Mild left basilar atelectasis is noted. Electronically Signed   By: Inez Catalina M.D.   On: 11/14/2019 08:21   DG Chest Port 1 View  Result Date: 11/13/2019 CLINICAL DATA:  Prior lung surgery. EXAM: PORTABLE CHEST 1 VIEW COMPARISON:  11/13/2019. FINDINGS: Left chest tube and PleurX catheter noted. Interim resolution of previously identified large left pleural effusion. Minimal amount of subpleural air may be present along the left lung base. Continued follow-up exam suggested. No prominent pneumothorax. Bibasilar atelectasis. Stable cardiomegaly. Degenerative change thoracic spine. Mild left chest wall subcutaneous emphysema. IMPRESSION: Left chest tube and PleurX catheter noted. Interim resolution of previously identified large left pleural effusion. Minimal amount of subpleural air may be present along the left lung base. No prominent pneumothorax. Mild left chest wall subcutaneous emphysema. Continued follow-up exams suggested. Electronically Signed   By: Marcello Moores  Register   On: 11/13/2019 10:33   DG Fluoro Guide CV Line-No Report  Result Date: 11/26/2019 Fluoroscopy was utilized by the requesting physician.  No radiographic interpretation.    ASSESSMENT AND PLAN: This is a very pleasant 79 years old white male recently diagnosed with malignant pleural mesothelioma involving the left hemithorax. I personally and independently reviewed his imaging studies and discuss results with the patient and his wife today. I recommended for the patient to proceed with the systemic chemotherapy with carboplatin for AUC of 5, Alimta 500 mg/M2 and Avastin 15 mg/KG every 3 weeks.  He will proceed with the first cycle of his treatment today as planned. The patient will come back for follow-up visit in 1 week for evaluation and management of any adverse effect of his treatment. He was advised to call immediately if he has any other concerning symptoms in the interval. The patient voices understanding of current  disease status and treatment options and is in agreement with the current care plan.  All questions were answered. The patient knows to call the clinic with any problems, questions or concerns. We can certainly see the patient much sooner if necessary.  The total time spent in the appointment was 30 minutes.  Disclaimer: This note was dictated with voice recognition software. Similar sounding words can inadvertently be transcribed and may not be corrected upon review.

## 2019-12-10 NOTE — Progress Notes (Signed)
[facility] OFFICE PROGRESS NOTE  Jack Pollen, MD [place] [address]  DIAGNOSIS: Stage I/IImalignant pleural mesothelioma involving the left hemithorax diagnosed in September 2021.  PRIOR THERAPY: None  CURRENT THERAPY: Carboplatin for AUC of 5, Alimta 500 mg/M2 and Avastin 15 mg/KG every 3 weeks. First dose on 12/06/19. Status post 1 cycle.   INTERVAL HISTORY: Jack Haynes 79 y.o. male returns to the clinic for a follow up visit accompanied by his wife. The patient is feeling well today without any concerning complaints. The patient underwent his first cycle of chemotherapy last week and he tolerated it well without any concerning adverse side effects except for fatigue that started several days after his chemotherapy. He also reported decreased sense of taste as well as smelling a foul odor. The patient is trying to stay active by walking several times a day. He also has been trying to increase his intake of protein rich food and using salt water rinses. He did lose about 3 lbs since his last appointment. Denies any fever, chills, night sweats. Denies any shortness of breath, cough, or hemoptysis but endorses some chest discomfort/tingling with coughing from where he had a chest tube placed. Denies any vomiting, diarrhea, or constipation. He had mild nausea and needed to take 2 compazine in the interval. Denies any headache or visual changes. Denies any bleeding complications. The patient is here today for evaluation, repeat lab work, and a 1 week follow up visit to manage any adverse side effects of treatment.    MEDICAL HISTORY: Past Medical History:  Diagnosis Date  . Arthritis   . Cancer (HCC)    hx of prostatae cancer  . Dyspnea    until chest tube placed  . Hypertension   . Mesothelioma [facility])     ALLERGIES:  has No Known Allergies.  MEDICATIONS:  Current Outpatient Medications  Medication Sig Dispense Refill  . acetaminophen  (TYLENOL) 500 MG tablet Take 2 tablets (1,000 mg total) by mouth every 6 (six) hours as needed for mild pain or fever. 30 tablet 0  . dexamethasone (DECADRON) 4 MG tablet 1 tablet p.o. twice daily the day before, day of and day after chemotherapy every 3 weeks 40 tablet 1  . folic acid (FOLVITE) 1 MG tablet Take 1 tablet (1 mg total) by mouth daily. 30 tablet 4  . lidocaine-prilocaine (EMLA) cream Apply to Port-A-Cath site 30-60-minute before treatment. 30 g 0  . lisinopril (ZESTRIL) 20 MG tablet Take 1 tablet (20 mg total) by mouth daily. 90 tablet 3  . Misc Natural Products (NEURIVA PO) Take 1 capsule by mouth daily.     . prochlorperazine (COMPAZINE) 10 MG tablet Take 1 tablet (10 mg total) by mouth every 6 (six) hours as needed for nausea or vomiting. 30 tablet 0  . rosuvastatin (CRESTOR) 10 MG tablet Take 1 tablet (10 mg total) by mouth daily. 90 tablet 3   No current facility-administered medications for this visit.    SURGICAL HISTORY:  Past Surgical History:  Procedure Laterality Date  . APPENDECTOMY    . BACK SURGERY  1998   disectomy  . CHEST TUBE INSERTION Left 11/13/2019   Procedure: INSERTION PLEURAL DRAINAGE CATHETER;  Surgeon: Grace Isaac, MD;  Location: [place];  Service: Thoracic;  Laterality: Left;  . EYE SURGERY Left 1965   injury  . JOINT REPLACEMENT Left 2010   left knee replacement  . KNEE SURGERY    . PLEURAL BIOPSY Left  11/13/2019   Procedure: PLEURAL BIOPSY;  Surgeon: Grace Isaac, MD;  Location: [place];  Service: Thoracic;  Laterality: Left;  . PORTACATH PLACEMENT Left 11/26/2019   Procedure: INSERTION PORT-A-CATH BARD POWERPORT 9.6 FR CATHETER;  Surgeon: Grace Isaac, MD;  Location: [place];  Service: Thoracic;  Laterality: Left;  . PROSTATE SURGERY    . REMOVAL OF PLEURAL DRAINAGE CATHETER Left 11/26/2019   Procedure: REMOVAL OF PLEURAL DRAINAGE CATHETER;  Surgeon: Grace Isaac, MD;  Location: [place];  Service: Thoracic;  Laterality: Left;   . SPINE SURGERY    . TALC PLEURODESIS Left 11/13/2019   Procedure: possible TALC PLEURADESIS;  Surgeon: Grace Isaac, MD;  Location: [place];  Service: Thoracic;  Laterality: Left;  . TOTAL KNEE ARTHROPLASTY  02/01/2012   Procedure: TOTAL KNEE ARTHROPLASTY;  Surgeon: Sydnee Cabal, MD;  Location: [facility];  Service: Orthopedics;  Laterality: Left;  Marland Kitchen VIDEO ASSISTED THORACOSCOPY Left 11/13/2019   Procedure: VIDEO ASSISTED THORACOSCOPY;  Surgeon: Grace Isaac, MD;  Location: [place];  Service: Thoracic;  Laterality: Left;  Marland Kitchen VIDEO BRONCHOSCOPY N/A 11/13/2019   Procedure: VIDEO BRONCHOSCOPY;  Surgeon: Grace Isaac, MD;  Location: [facility];  Service: Thoracic;  Laterality: N/A;    REVIEW OF SYSTEMS:   Review of Systems  Constitutional: Positive for fatigue (improving), decreased appetite, and weight loss. Negative for chills and fever. Marland Kitchen  HENT: Positive for taste alterations and smelling a foul smell. Negative for mouth sores, nosebleeds, sore throat and trouble swallowing.   Eyes: Negative for eye problems and icterus.  Respiratory: Negative for cough, hemoptysis, shortness of breath and wheezing.   Cardiovascular: Positive for chest discomfort from prior chest tube site. Negative for leg swelling.  Gastrointestinal: Positive for mild nausea. Negative for abdominal pain, constipation, diarrhea, and vomiting.  Genitourinary: Negative for bladder incontinence, difficulty urinating, dysuria, frequency and hematuria.   Musculoskeletal: Negative for back pain, gait problem, neck pain and neck stiffness.  Skin: Negative for itching and rash.  Neurological: Negative for dizziness, extremity weakness, gait problem, headaches, light-headedness and seizures.  Hematological: Negative for adenopathy. Does not bruise/bleed easily.  Psychiatric/Behavioral: Negative for confusion, depression and sleep disturbance. The patient is not nervous/anxious.     PHYSICAL EXAMINATION:  There were no vitals  taken for this visit.  ECOG PERFORMANCE STATUS: 1 - Symptomatic but completely ambulatory  Physical Exam  Constitutional: Oriented to person, place, and time and well-developed, well-nourished, and in no distress.  HENT:  Head: Normocephalic and atraumatic.  Mouth/Throat: Oropharynx is clear and moist. Some white coating on the tongue.  Eyes: Conjunctivae are normal. Right eye exhibits no discharge. Left eye exhibits no discharge. No scleral icterus.  Neck: Normal range of motion. Neck supple.  Cardiovascular: Normal rate, regular rhythm, normal heart sounds and intact distal pulses.   Pulmonary/Chest: Effort normal and breath sounds normal. No respiratory distress. No wheezes. No rales.  Abdominal: Soft. Bowel sounds are normal. Exhibits no distension and no mass. There is no tenderness.  Musculoskeletal: Normal range of motion. Exhibits no edema.  Lymphadenopathy:    No cervical adenopathy.  Neurological: Alert and oriented to person, place, and time. Exhibits normal muscle tone. Gait normal. Coordination normal.  Skin: Skin is warm and dry. No rash noted. Not diaphoretic. No erythema. No pallor.  Psychiatric: Mood, memory and judgment normal.  Vitals reviewed.  LABORATORY DATA: Lab Results  Component Value Date   WBC 5.3 12/12/2019   HGB 14.3 12/12/2019   HCT 41.2  12/12/2019   MCV 87.1 12/12/2019   PLT 141 (L) 12/12/2019      Chemistry      Component Value Date/Time   NA 133 (L) 12/06/2019 0717   NA 141 09/24/2019 0832   K 4.3 12/06/2019 0717   CL 102 12/06/2019 0717   CO2 21 (L) 12/06/2019 0717   BUN 16 12/06/2019 0717   BUN 17 09/24/2019 0832   CREATININE 0.92 12/06/2019 0717   CREATININE 1.01 12/24/2014 0916      Component Value Date/Time   CALCIUM 9.6 12/06/2019 0717   ALKPHOS 82 12/06/2019 0717   AST 11 (L) 12/06/2019 0717   ALT 16 12/06/2019 0717   BILITOT 0.4 12/06/2019 0717       RADIOGRAPHIC STUDIES:  DG Chest 2 View  Result Date:  11/26/2019 CLINICAL DATA:  Port-A-Cath placement. EXAM: CHEST - 2 VIEW COMPARISON:  11/26/2019 FINDINGS: Left Port-A-Cath tip overlies the proximal SVC. No evidence for pneumothorax. Left pleural drain seen previously has been removed in the interval. There is persistent patchy opacity at the left base with tiny left pleural effusion. Right lung clear. Cardiopericardial silhouette is at upper limits of normal for size. The visualized bony structures of the thorax show no acute abnormality. IMPRESSION: Left Port-A-Cath tip overlies the proximal SVC. No evidence for pneumothorax. Electronically Signed   By: Misty Stanley M.D.   On: 11/26/2019 10:00   DG Chest 2 View  Result Date: 11/26/2019 CLINICAL DATA:  Mesothelioma. EXAM: CHEST - 2 VIEW COMPARISON:  11/15/2019 FINDINGS: Similar mild volume loss left hemithorax. The diffuse pleural thickening in the left chest with nodular component at the apex has decreased in the interval. Left pleural drain remains in place posteriorly. Right lung clear. Cardiopericardial silhouette is at upper limits of normal for size. The visualized bony structures of the thorax show no acute abnormality. IMPRESSION: 1. Interval decrease in pleural thickening in the left chest with persistent left pleural drain. 2. No acute cardiopulmonary findings. Electronically Signed   By: Misty Stanley M.D.   On: 11/26/2019 06:28   DG Chest 2 View  Result Date: 11/15/2019 CLINICAL DATA:  Chest tube removal. EXAM: CHEST - 2 VIEW COMPARISON:  Same day. FINDINGS: Stable cardiomegaly. One left-sided chest tube has been removed. Remaining left-sided chest tube is noted in the lung base. No definite pneumothorax is noted. Stable nodular opacities are noted in the left upper lobe which may represent loculated effusions. Stable left basilar atelectasis and pleural thickening is noted. Minimal right basilar subsegmental atelectasis is noted. Bony thorax is unremarkable. IMPRESSION: One left-sided chest  tube has been removed. Remaining left-sided chest tube is noted in the lung base. No definite pneumothorax seen. Stable nodular opacities in the left upper lobe which may represent loculated effusions. Electronically Signed   By: Marijo Conception M.D.   On: 11/15/2019 13:53   DG Chest 2 View  Result Date: 11/13/2019 CLINICAL DATA:  Preop exam. Left pleural effusion. Bronchoscopy today. EXAM: CHEST - 2 VIEW COMPARISON:  11/07/2019 FINDINGS: Interval increase in the size of a now moderate to large left pleural effusion. Overlying opacity. The right lung is clear. There is mild rightward mediastinal shift. Cardiac silhouette is largely obscured, but visualized portions appear similar to prior. No pneumothorax. Multilevel degenerative change of the thoracic spine with flowing anterior osteophytes compatible with diffuse idiopathic skeletal hyperostosis (DISH). No acute osseous abnormality. IMPRESSION: Interval increase in the size of a now moderate to large left pleural effusion. Overlying opacities are favored  to represent atelectasis/lung collapse. Electronically Signed   By: Margaretha Sheffield MD   On: 11/13/2019 07:50   MR BRAIN W WO CONTRAST  Result Date: 11/27/2019 CLINICAL DATA:  Non-small cell lung cancer, staging. EXAM: MRI HEAD WITHOUT AND WITH CONTRAST TECHNIQUE: Multiplanar, multiecho pulse sequences of the brain and surrounding structures were obtained without and with intravenous contrast. CONTRAST:  73mL GADAVIST GADOBUTROL 1 MMOL/ML IV SOLN COMPARISON:  None. FINDINGS: Brain: No acute infarction, hemorrhage, hydrocephalus, extra-axial collection or mass lesion. Few scattered foci of T2 hyperintensity are seen within the white matter of the cerebral hemispheres, nonspecific, most likely related to chronic small vessel ischemia. Focus of susceptibility artifact in the left frontal lobe, likely hemosiderin deposits. No focus of abnormal contrast enhancement. Vascular: Normal flow voids. Skull and  upper cervical spine: Normal marrow signal. Sinuses/Orbits: Mild scattered mucosal thickening throughout the paranasal sinuses with fluid level within the right maxillary sinus. The orbits are maintained. Other: None. IMPRESSION: 1. No evidence of intracranial metastatic disease. 2. Mild chronic small vessel ischemia. 3. Paranasal sinus disease with fluid level within the right maxillary sinus, which may represent acute sinusitis in the appropriate clinical setting. Electronically Signed   By: Pedro Earls M.D.   On: 11/27/2019 09:44   NM PET Image Initial (PI) Skull Base To Thigh  Result Date: 11/29/2019 CLINICAL DATA:  Initial treatment strategy for non-small-cell lung cancer. EXAM: NUCLEAR MEDICINE PET SKULL BASE TO THIGH TECHNIQUE: 9.3 mCi F-18 FDG was injected intravenously. Full-ring PET imaging was performed from the skull base to thigh after the radiotracer. CT data was obtained and used for attenuation correction and anatomic localization. Fasting blood glucose: 122 mg/dl COMPARISON:  CT chest 10/30/2019. FINDINGS: Mediastinal blood pool activity: SUV max 2.5 Liver activity: SUV max NA NECK: No hypermetabolic lymph nodes in the neck. Incidental CT findings: none CHEST: Nodular pleural thickening in the left hemithorax is hypermetabolic with prominent hypermetabolic posteromedial nodule (image 84/4) demonstrating SUV max = 11.5. Pleura in the posterior costophrenic sulcus demonstrates SUV max = 7.4. No hypermetabolic lymphadenopathy in the mediastinum or right hilum. No hypermetabolic axillary lymphadenopathy. Incidental CT findings: Left Port-A-Cath tip is positioned in the proximal SVC. Coronary artery calcification is evident. High attenuation material in the left pleural space is compatible with pleurodesis. Small amount of pleural gas seen posteriorly towards the base likely related to the presence of a previous chest tube and pleurodesis. ABDOMEN/PELVIS: No abnormal hypermetabolic  activity within the liver, pancreas, adrenal glands, or spleen. No hypermetabolic lymph nodes in the abdomen or pelvis. Incidental CT findings: Diverticular changes noted left colon. Atherosclerotic calcification evident in the abdominal aorta without aneurysm. SKELETON: No focal hypermetabolic activity to suggest skeletal metastasis. Incidental CT findings: No worrisome lytic or sclerotic osseous abnormality. IMPRESSION: 1. Diffuse hypermetabolism in the nodular pleural thickening of the left hemithorax. No evidence for hypermetabolic hilar or mediastinal lymphadenopathy. 2. No hypermetabolic metastatic disease in the neck, abdomen, or pelvis. 3. Left colonic diverticulosis without diverticulitis. 4.  Aortic Atherosclerois (ICD10-170.0) Electronically Signed   By: Misty Stanley M.D.   On: 11/29/2019 11:40   DG CHEST PORT 1 VIEW  Result Date: 11/15/2019 CLINICAL DATA:  Pleural effusion EXAM: PORTABLE CHEST 1 VIEW COMPARISON:  11/14/2019 FINDINGS: Shallow inspiration. Cardiac enlargement. 2 left chest tubes remain in place without change in position. No visible left pneumothorax. Mild subcutaneous emphysema along the lateral chest. Nodular opacity in the left apex and along the left chest wall may represent a loculated effusion.  Degenerative changes in the spine. IMPRESSION: 1. Cardiac enlargement. 2. Two left chest tubes remain in place. No visible left pneumothorax. 3. Probable loculated fluid in the left apex and along the left chest wall. Electronically Signed   By: Lucienne Capers M.D.   On: 11/15/2019 05:48   DG CHEST PORT 1 VIEW  Result Date: 11/14/2019 CLINICAL DATA:  Follow-up PleurX catheter EXAM: PORTABLE CHEST 1 VIEW COMPARISON:  11/13/2019 FINDINGS: Cardiac shadow is mildly prominent but stable. Left-sided PleurX catheter is seen in satisfactory position. No pneumothorax is noted. No sizable effusion is seen. Minimal left basilar atelectasis is noted. Right lung remains clear. Degenerative  changes of the thoracic spine are noted. IMPRESSION: PleurX catheter in place on the left. No pneumothorax or sizable effusion is seen. Mild left basilar atelectasis is noted. Electronically Signed   By: Inez Catalina M.D.   On: 11/14/2019 08:21   DG Chest Port 1 View  Result Date: 11/13/2019 CLINICAL DATA:  Prior lung surgery. EXAM: PORTABLE CHEST 1 VIEW COMPARISON:  11/13/2019. FINDINGS: Left chest tube and PleurX catheter noted. Interim resolution of previously identified large left pleural effusion. Minimal amount of subpleural air may be present along the left lung base. Continued follow-up exam suggested. No prominent pneumothorax. Bibasilar atelectasis. Stable cardiomegaly. Degenerative change thoracic spine. Mild left chest wall subcutaneous emphysema. IMPRESSION: Left chest tube and PleurX catheter noted. Interim resolution of previously identified large left pleural effusion. Minimal amount of subpleural air may be present along the left lung base. No prominent pneumothorax. Mild left chest wall subcutaneous emphysema. Continued follow-up exams suggested. Electronically Signed   By: Marcello Moores  Register   On: 11/13/2019 10:33   DG Fluoro Guide CV Line-No Report  Result Date: 11/26/2019 Fluoroscopy was utilized by the requesting physician.  No radiographic interpretation.     ASSESSMENT/PLAN:  This is a very pleasant 79 year old Caucasian male diagnosed with malignant mesothelioma involving the left hemithorax.  The patient was diagnosed in September 2021.  The patient is currently undergoing systemic chemotherapy with carboplatin for an AUC of 5, Alimta 500 mg per metered squared, and Avastin 15 mg/kg IV every 3 weeks.  He is status post 1 cycle and he tolerated it well without any concerning adverse side effects except for some fatigue and taste alterations.   Labs were reviewed.  Recommend that he continue on the same treatment at the same dose.  We will see him back for follow-up visit in  2 weeks for evaluation before starting cycle #2.  He had some evidence of thrush on exam today. I have sent in a prescription for diflucan 100 mg tablets p.o. for 7 days.  He was encouraged to use salt water rinses.  He was also encouraged to increase his protein dietary intake.  I will arrange for a COVID-19 booster to be given next week when he comes in for weekly labs.   The patient was advised to call immediately if he has any concerning symptoms in the interval. The patient voices understanding of current disease status and treatment options and is in agreement with the current care plan. All questions were answered. The patient knows to call the clinic with any problems, questions or concerns. We can certainly see the patient much sooner if necessary    No orders of the defined types were placed in this encounter.    Joaquina Nissen L Keyarah Mcroy, PA-C 12/12/19

## 2019-12-12 ENCOUNTER — Inpatient Hospital Stay: Payer: Medicare PPO

## 2019-12-12 ENCOUNTER — Other Ambulatory Visit: Payer: Self-pay

## 2019-12-12 ENCOUNTER — Inpatient Hospital Stay: Payer: Medicare PPO | Admitting: Physician Assistant

## 2019-12-12 VITALS — BP 116/73 | HR 86 | Temp 98.1°F | Resp 16 | Ht 67.0 in | Wt 183.0 lb

## 2019-12-12 DIAGNOSIS — B370 Candidal stomatitis: Secondary | ICD-10-CM

## 2019-12-12 DIAGNOSIS — C457 Mesothelioma of other sites: Secondary | ICD-10-CM

## 2019-12-12 DIAGNOSIS — J90 Pleural effusion, not elsewhere classified: Secondary | ICD-10-CM | POA: Diagnosis not present

## 2019-12-12 DIAGNOSIS — Z5111 Encounter for antineoplastic chemotherapy: Secondary | ICD-10-CM | POA: Diagnosis not present

## 2019-12-12 DIAGNOSIS — C459 Mesothelioma, unspecified: Secondary | ICD-10-CM | POA: Diagnosis not present

## 2019-12-12 DIAGNOSIS — M47814 Spondylosis without myelopathy or radiculopathy, thoracic region: Secondary | ICD-10-CM | POA: Diagnosis not present

## 2019-12-12 DIAGNOSIS — I6782 Cerebral ischemia: Secondary | ICD-10-CM | POA: Diagnosis not present

## 2019-12-12 DIAGNOSIS — Z8546 Personal history of malignant neoplasm of prostate: Secondary | ICD-10-CM | POA: Diagnosis not present

## 2019-12-12 DIAGNOSIS — Z23 Encounter for immunization: Secondary | ICD-10-CM | POA: Diagnosis not present

## 2019-12-12 DIAGNOSIS — I119 Hypertensive heart disease without heart failure: Secondary | ICD-10-CM | POA: Diagnosis not present

## 2019-12-12 DIAGNOSIS — Z809 Family history of malignant neoplasm, unspecified: Secondary | ICD-10-CM | POA: Diagnosis not present

## 2019-12-12 LAB — CMP (CANCER CENTER ONLY)
ALT: 14 U/L (ref 0–44)
AST: 11 U/L — ABNORMAL LOW (ref 15–41)
Albumin: 3.3 g/dL — ABNORMAL LOW (ref 3.5–5.0)
Alkaline Phosphatase: 74 U/L (ref 38–126)
Anion gap: 4 — ABNORMAL LOW (ref 5–15)
BUN: 17 mg/dL (ref 8–23)
CO2: 28 mmol/L (ref 22–32)
Calcium: 9.2 mg/dL (ref 8.9–10.3)
Chloride: 98 mmol/L (ref 98–111)
Creatinine: 0.87 mg/dL (ref 0.61–1.24)
GFR, Estimated: >60 mL/min (ref >60)
Glucose, Bld: 155 mg/dL — ABNORMAL HIGH (ref 70–99)
Potassium: 3.9 mmol/L (ref 3.5–5.1)
Sodium: 130 mmol/L — ABNORMAL LOW (ref 135–145)
Total Bilirubin: 0.9 mg/dL (ref 0.3–1.2)
Total Protein: 7.1 g/dL (ref 6.5–8.1)

## 2019-12-12 LAB — CBC WITH DIFFERENTIAL (CANCER CENTER ONLY)
Abs Immature Granulocytes: 0.02 10*3/uL (ref 0.00–0.07)
Basophils Absolute: 0 10*3/uL (ref 0.0–0.1)
Basophils Relative: 0 %
Eosinophils Absolute: 0.1 10*3/uL (ref 0.0–0.5)
Eosinophils Relative: 2 %
HCT: 41.2 % (ref 39.0–52.0)
Hemoglobin: 14.3 g/dL (ref 13.0–17.0)
Immature Granulocytes: 0 %
Lymphocytes Relative: 23 %
Lymphs Abs: 1.2 10*3/uL (ref 0.7–4.0)
MCH: 30.2 pg (ref 26.0–34.0)
MCHC: 34.7 g/dL (ref 30.0–36.0)
MCV: 87.1 fL (ref 80.0–100.0)
Monocytes Absolute: 0.2 10*3/uL (ref 0.1–1.0)
Monocytes Relative: 3 %
Neutro Abs: 3.8 10*3/uL (ref 1.7–7.7)
Neutrophils Relative %: 72 %
Platelet Count: 141 10*3/uL — ABNORMAL LOW (ref 150–400)
RBC: 4.73 MIL/uL (ref 4.22–5.81)
RDW: 11.4 % — ABNORMAL LOW (ref 11.5–15.5)
WBC Count: 5.3 10*3/uL (ref 4.0–10.5)
nRBC: 0 % (ref 0.0–0.2)

## 2019-12-12 LAB — TOTAL PROTEIN, URINE DIPSTICK

## 2019-12-12 MED ORDER — FLUCONAZOLE 100 MG PO TABS: 100.0000 mg | ORAL_TABLET | Freq: Every day | ORAL | 0 refills | Status: DC

## 2019-12-14 ENCOUNTER — Telehealth: Payer: Self-pay

## 2019-12-14 NOTE — Telephone Encounter (Signed)
A representative from the [facility]) services in charge of [name] home care nursing services called to inform the practice [name] is no longer home bound and is requesting to be discharged from [place] services

## 2019-12-18 NOTE — Telephone Encounter (Signed)
Not sure how to go about this.  Home health services was not started by [place] but will try to help. The people who initiated this should be able to terminate it if appropriate.  Thanks.

## 2019-12-18 NOTE — Telephone Encounter (Signed)
Pt is asking to be discharge from home health. He is no longer in need of the services

## 2019-12-20 ENCOUNTER — Other Ambulatory Visit: Payer: Self-pay

## 2019-12-20 ENCOUNTER — Inpatient Hospital Stay: Payer: Medicare PPO

## 2019-12-20 ENCOUNTER — Inpatient Hospital Stay: Payer: Medicare PPO | Attending: Internal Medicine

## 2019-12-20 DIAGNOSIS — Z5111 Encounter for antineoplastic chemotherapy: Secondary | ICD-10-CM | POA: Insufficient documentation

## 2019-12-20 DIAGNOSIS — C457 Mesothelioma of other sites: Secondary | ICD-10-CM

## 2019-12-20 DIAGNOSIS — I10 Essential (primary) hypertension: Secondary | ICD-10-CM | POA: Insufficient documentation

## 2019-12-20 DIAGNOSIS — R0602 Shortness of breath: Secondary | ICD-10-CM | POA: Diagnosis not present

## 2019-12-20 DIAGNOSIS — C450 Mesothelioma of pleura: Secondary | ICD-10-CM | POA: Diagnosis not present

## 2019-12-20 DIAGNOSIS — M129 Arthropathy, unspecified: Secondary | ICD-10-CM | POA: Insufficient documentation

## 2019-12-20 DIAGNOSIS — Z79899 Other long term (current) drug therapy: Secondary | ICD-10-CM | POA: Diagnosis not present

## 2019-12-20 DIAGNOSIS — R066 Hiccough: Secondary | ICD-10-CM | POA: Diagnosis not present

## 2019-12-20 DIAGNOSIS — Z8546 Personal history of malignant neoplasm of prostate: Secondary | ICD-10-CM | POA: Insufficient documentation

## 2019-12-20 DIAGNOSIS — Z23 Encounter for immunization: Secondary | ICD-10-CM

## 2019-12-20 LAB — CMP (CANCER CENTER ONLY)
ALT: 55 U/L — ABNORMAL HIGH (ref 0–44)
AST: 27 U/L (ref 15–41)
Albumin: 3.3 g/dL — ABNORMAL LOW (ref 3.5–5.0)
Alkaline Phosphatase: 82 U/L (ref 38–126)
Anion gap: 7 (ref 5–15)
BUN: 12 mg/dL (ref 8–23)
CO2: 28 mmol/L (ref 22–32)
Calcium: 9 mg/dL (ref 8.9–10.3)
Chloride: 102 mmol/L (ref 98–111)
Creatinine: 0.88 mg/dL (ref 0.61–1.24)
GFR, Estimated: >60 mL/min (ref >60)
Glucose, Bld: 126 mg/dL — ABNORMAL HIGH (ref 70–99)
Potassium: 4.2 mmol/L (ref 3.5–5.1)
Sodium: 137 mmol/L (ref 135–145)
Total Bilirubin: 0.2 mg/dL — ABNORMAL LOW (ref 0.3–1.2)
Total Protein: 6.8 g/dL (ref 6.5–8.1)

## 2019-12-20 LAB — CBC WITH DIFFERENTIAL (CANCER CENTER ONLY)
Abs Immature Granulocytes: 0.06 10*3/uL (ref 0.00–0.07)
Basophils Absolute: 0 10*3/uL (ref 0.0–0.1)
Basophils Relative: 0 %
Eosinophils Absolute: 0.1 10*3/uL (ref 0.0–0.5)
Eosinophils Relative: 2 %
HCT: 39.5 % (ref 39.0–52.0)
Hemoglobin: 13.8 g/dL (ref 13.0–17.0)
Immature Granulocytes: 1 %
Lymphocytes Relative: 33 %
Lymphs Abs: 1.6 10*3/uL (ref 0.7–4.0)
MCH: 30.2 pg (ref 26.0–34.0)
MCHC: 34.9 g/dL (ref 30.0–36.0)
MCV: 86.4 fL (ref 80.0–100.0)
Monocytes Absolute: 0.6 10*3/uL (ref 0.1–1.0)
Monocytes Relative: 13 %
Neutro Abs: 2.5 10*3/uL (ref 1.7–7.7)
Neutrophils Relative %: 51 %
Platelet Count: 183 10*3/uL (ref 150–400)
RBC: 4.57 MIL/uL (ref 4.22–5.81)
RDW: 11.7 % (ref 11.5–15.5)
WBC Count: 4.9 10*3/uL (ref 4.0–10.5)
nRBC: 0 % (ref 0.0–0.2)

## 2019-12-27 ENCOUNTER — Inpatient Hospital Stay (HOSPITAL_BASED_OUTPATIENT_CLINIC_OR_DEPARTMENT_OTHER): Payer: Medicare PPO | Admitting: Internal Medicine

## 2019-12-27 ENCOUNTER — Inpatient Hospital Stay: Payer: Medicare PPO

## 2019-12-27 ENCOUNTER — Other Ambulatory Visit: Payer: Self-pay

## 2019-12-27 ENCOUNTER — Encounter: Payer: Self-pay | Admitting: Internal Medicine

## 2019-12-27 VITALS — BP 130/69 | HR 80 | Temp 98.6°F | Resp 17 | Ht 67.0 in | Wt 188.2 lb

## 2019-12-27 VITALS — BP 141/73 | HR 72 | Temp 98.2°F | Resp 20

## 2019-12-27 DIAGNOSIS — C450 Mesothelioma of pleura: Secondary | ICD-10-CM | POA: Diagnosis not present

## 2019-12-27 DIAGNOSIS — I10 Essential (primary) hypertension: Secondary | ICD-10-CM

## 2019-12-27 DIAGNOSIS — C457 Mesothelioma of other sites: Secondary | ICD-10-CM

## 2019-12-27 DIAGNOSIS — Z79899 Other long term (current) drug therapy: Secondary | ICD-10-CM | POA: Diagnosis not present

## 2019-12-27 DIAGNOSIS — R066 Hiccough: Secondary | ICD-10-CM | POA: Diagnosis not present

## 2019-12-27 DIAGNOSIS — R0602 Shortness of breath: Secondary | ICD-10-CM | POA: Diagnosis not present

## 2019-12-27 DIAGNOSIS — Z5111 Encounter for antineoplastic chemotherapy: Secondary | ICD-10-CM | POA: Diagnosis not present

## 2019-12-27 DIAGNOSIS — M129 Arthropathy, unspecified: Secondary | ICD-10-CM | POA: Diagnosis not present

## 2019-12-27 DIAGNOSIS — Z8546 Personal history of malignant neoplasm of prostate: Secondary | ICD-10-CM | POA: Diagnosis not present

## 2019-12-27 LAB — CBC WITH DIFFERENTIAL (CANCER CENTER ONLY)
Abs Immature Granulocytes: 0.15 K/uL — ABNORMAL HIGH (ref 0.00–0.07)
Basophils Absolute: 0 K/uL (ref 0.0–0.1)
Basophils Relative: 0 %
Eosinophils Absolute: 0 K/uL (ref 0.0–0.5)
Eosinophils Relative: 0 %
HCT: 39 % (ref 39.0–52.0)
Hemoglobin: 13.4 g/dL (ref 13.0–17.0)
Immature Granulocytes: 2 %
Lymphocytes Relative: 14 %
Lymphs Abs: 1.1 K/uL (ref 0.7–4.0)
MCH: 29.9 pg (ref 26.0–34.0)
MCHC: 34.4 g/dL (ref 30.0–36.0)
MCV: 87.1 fL (ref 80.0–100.0)
Monocytes Absolute: 0.4 K/uL (ref 0.1–1.0)
Monocytes Relative: 5 %
Neutro Abs: 6.1 K/uL (ref 1.7–7.7)
Neutrophils Relative %: 79 %
Platelet Count: 351 K/uL (ref 150–400)
RBC: 4.48 MIL/uL (ref 4.22–5.81)
RDW: 11.9 % (ref 11.5–15.5)
WBC Count: 7.8 K/uL (ref 4.0–10.5)
nRBC: 0 % (ref 0.0–0.2)

## 2019-12-27 LAB — CMP (CANCER CENTER ONLY)
ALT: 32 U/L (ref 0–44)
AST: 18 U/L (ref 15–41)
Albumin: 3.6 g/dL (ref 3.5–5.0)
Alkaline Phosphatase: 75 U/L (ref 38–126)
Anion gap: 12 (ref 5–15)
BUN: 16 mg/dL (ref 8–23)
CO2: 21 mmol/L — ABNORMAL LOW (ref 22–32)
Calcium: 9.4 mg/dL (ref 8.9–10.3)
Chloride: 104 mmol/L (ref 98–111)
Creatinine: 0.93 mg/dL (ref 0.61–1.24)
GFR, Estimated: >60 mL/min
Glucose, Bld: 186 mg/dL — ABNORMAL HIGH (ref 70–99)
Potassium: 4.6 mmol/L (ref 3.5–5.1)
Sodium: 137 mmol/L (ref 135–145)
Total Bilirubin: 0.4 mg/dL (ref 0.3–1.2)
Total Protein: 7 g/dL (ref 6.5–8.1)

## 2019-12-27 LAB — TOTAL PROTEIN, URINE DIPSTICK: Protein, ur: NEGATIVE mg/dL

## 2019-12-27 MED ORDER — SODIUM CHLORIDE 0.9 % IV SOLN
Freq: Once | INTRAVENOUS | Status: AC
  Filled 2019-12-27: qty 250

## 2019-12-27 MED ORDER — ALTEPLASE 2 MG IJ SOLR
2.0000 mg | Freq: Once | INTRAMUSCULAR | Status: AC | PRN
  Administered 2019-12-27: 2 mg
  Filled 2019-12-27: qty 2

## 2019-12-27 MED ORDER — SODIUM CHLORIDE 0.9 % IV SOLN
500.0000 mg/m2 | Freq: Once | INTRAVENOUS | Status: AC
  Administered 2019-12-27: 1000 mg via INTRAVENOUS
  Filled 2019-12-27: qty 40

## 2019-12-27 MED ORDER — ALTEPLASE 2 MG IJ SOLR
INTRAMUSCULAR | Status: AC
  Filled 2019-12-27: qty 2

## 2019-12-27 MED ORDER — PALONOSETRON HCL INJECTION 0.25 MG/5ML
INTRAVENOUS | Status: AC
  Filled 2019-12-27: qty 5

## 2019-12-27 MED ORDER — SODIUM CHLORIDE 0.9 % IV SOLN
481.5000 mg | Freq: Once | INTRAVENOUS | Status: AC
  Administered 2019-12-27: 480 mg via INTRAVENOUS
  Filled 2019-12-27: qty 48

## 2019-12-27 MED ORDER — PALONOSETRON HCL INJECTION 0.25 MG/5ML
0.2500 mg | Freq: Once | INTRAVENOUS | Status: AC
  Administered 2019-12-27: 0.25 mg via INTRAVENOUS

## 2019-12-27 MED ORDER — SODIUM CHLORIDE 0.9% FLUSH
10.0000 mL | INTRAVENOUS | Status: DC | PRN
  Administered 2019-12-27: 10 mL
  Filled 2019-12-27: qty 10

## 2019-12-27 MED ORDER — SODIUM CHLORIDE 0.9 % IV SOLN
10.0000 mg | Freq: Once | INTRAVENOUS | Status: AC
  Administered 2019-12-27: 10 mg via INTRAVENOUS
  Filled 2019-12-27: qty 10

## 2019-12-27 MED ORDER — SODIUM CHLORIDE 0.9 % IV SOLN
150.0000 mg | Freq: Once | INTRAVENOUS | Status: AC
  Administered 2019-12-27: 150 mg via INTRAVENOUS
  Filled 2019-12-27: qty 150

## 2019-12-27 MED ORDER — HEPARIN SOD (PORK) LOCK FLUSH 100 UNIT/ML IV SOLN
500.0000 [IU] | Freq: Once | INTRAVENOUS | Status: AC | PRN
  Administered 2019-12-27: 500 [IU]
  Filled 2019-12-27: qty 5

## 2019-12-27 MED ORDER — SODIUM CHLORIDE 0.9 % IV SOLN
15.0000 mg/kg | Freq: Once | INTRAVENOUS | Status: AC
  Administered 2019-12-27: 1300 mg via INTRAVENOUS
  Filled 2019-12-27: qty 48

## 2019-12-27 NOTE — Patient Instructions (Signed)
[facility] Discharge Instructions for Patients Receiving Chemotherapy  Today you received the following immunotherapy agent: Bevacizumab and chemotherapy agents: Pemetrexed (Alimta) and Carboplatin.  To help prevent nausea and vomiting after your treatment, we encourage you to take your nausea medication as directed by your MD.   If you develop nausea and vomiting that is not controlled by your nausea medication, call the clinic.   BELOW ARE SYMPTOMS THAT SHOULD BE REPORTED IMMEDIATELY:  *FEVER GREATER THAN 100.5 F  *CHILLS WITH OR WITHOUT FEVER  NAUSEA AND VOMITING THAT IS NOT CONTROLLED WITH YOUR NAUSEA MEDICATION  *UNUSUAL SHORTNESS OF BREATH  *UNUSUAL BRUISING OR BLEEDING  TENDERNESS IN MOUTH AND THROAT WITH OR WITHOUT PRESENCE OF ULCERS  *URINARY PROBLEMS  *BOWEL PROBLEMS  UNUSUAL RASH Items with * indicate a potential emergency and should be followed up as soon as possible.  Feel free to call the clinic should you have any questions or concerns. The clinic phone number is [phone].  Please show the [facility] at check-in to the Emergency Department and triage nurse.

## 2019-12-27 NOTE — Progress Notes (Signed)
Jack Haynes [phone]   [phone]  OFFICE PROGRESS NOTE  Jack Pollen, MD [place] [address]  DIAGNOSIS: Stage I/II malignant pleural mesothelioma involving the left hemithorax diagnosed in September 2021.  PRIOR THERAPY: None.  CURRENT THERAPY:   Systemic chemotherapy with carboplatin for AUC of 5, Alimta 500 mg/M2 and Avastin 15 mg/KG every 3 weeks.  Status post 1 cycle.  INTERVAL HISTORY: Jack Haynes 79 y.o. male returns to the clinic today for follow-up visit accompanied by his wife.  The patient is feeling fine today with no concerning complaints except for fatigue few days after this treatment.  He also has oral thrush and hiccup.  He is feeling much better now.  He denied having any chest pain, shortness of breath, cough or hemoptysis.  He denied having any nausea, vomiting, diarrhea or constipation.  He has no weight loss or night sweats.  He is here today for evaluation before starting cycle #2.   MEDICAL HISTORY: Past Medical History:  Diagnosis Date  . Arthritis   . Cancer (HCC)    hx of prostatae cancer  . Dyspnea    until chest tube placed  . Hypertension   . Mesothelioma [facility])     ALLERGIES:  has No Known Allergies.  MEDICATIONS:  Current Outpatient Medications  Medication Sig Dispense Refill  . acetaminophen (TYLENOL) 500 MG tablet Take 2 tablets (1,000 mg total) by mouth every 6 (six) hours as needed for mild pain or fever. 30 tablet 0  . dexamethasone (DECADRON) 4 MG tablet 1 tablet p.o. twice daily the day before, day of and day after chemotherapy every 3 weeks 40 tablet 1  . fluconazole (DIFLUCAN) 100 MG tablet Take 1 tablet (100 mg total) by mouth daily. 7 tablet 0  . folic acid (FOLVITE) 1 MG tablet Take 1 tablet (1 mg total) by mouth daily. 30 tablet 4  . lidocaine-prilocaine (EMLA) cream Apply to Port-A-Cath site 30-60-minute before treatment. 30 g 0  . lisinopril (ZESTRIL) 20 MG  tablet Take 1 tablet (20 mg total) by mouth daily. 90 tablet 3  . Misc Natural Products (NEURIVA PO) Take 1 capsule by mouth daily.     . prochlorperazine (COMPAZINE) 10 MG tablet Take 1 tablet (10 mg total) by mouth every 6 (six) hours as needed for nausea or vomiting. 30 tablet 0  . rosuvastatin (CRESTOR) 10 MG tablet Take 1 tablet (10 mg total) by mouth daily. 90 tablet 3   No current facility-administered medications for this visit.    SURGICAL HISTORY:  Past Surgical History:  Procedure Laterality Date  . APPENDECTOMY    . BACK SURGERY  1998   disectomy  . CHEST TUBE INSERTION Left 11/13/2019   Procedure: INSERTION PLEURAL DRAINAGE CATHETER;  Surgeon: Grace Isaac, MD;  Location: [place];  Service: Thoracic;  Laterality: Left;  . EYE SURGERY Left 1965   injury  . JOINT REPLACEMENT Left 2010   left knee replacement  . KNEE SURGERY    . PLEURAL BIOPSY Left 11/13/2019   Procedure: PLEURAL BIOPSY;  Surgeon: Grace Isaac, MD;  Location: [place];  Service: Thoracic;  Laterality: Left;  . PORTACATH PLACEMENT Left 11/26/2019   Procedure: INSERTION PORT-A-CATH BARD POWERPORT 9.6 FR CATHETER;  Surgeon: Grace Isaac, MD;  Location: [place];  Service: Thoracic;  Laterality: Left;  . PROSTATE SURGERY    . REMOVAL OF PLEURAL DRAINAGE CATHETER Left 11/26/2019   Procedure:  REMOVAL OF PLEURAL DRAINAGE CATHETER;  Surgeon: Grace Isaac, MD;  Location: [place];  Service: Thoracic;  Laterality: Left;  . SPINE SURGERY    . TALC PLEURODESIS Left 11/13/2019   Procedure: possible TALC PLEURADESIS;  Surgeon: Grace Isaac, MD;  Location: [place];  Service: Thoracic;  Laterality: Left;  . TOTAL KNEE ARTHROPLASTY  02/01/2012   Procedure: TOTAL KNEE ARTHROPLASTY;  Surgeon: Sydnee Cabal, MD;  Location: [facility];  Service: Orthopedics;  Laterality: Left;  Marland Kitchen VIDEO ASSISTED THORACOSCOPY Left 11/13/2019   Procedure: VIDEO ASSISTED THORACOSCOPY;  Surgeon: Grace Isaac, MD;  Location: [place];   Service: Thoracic;  Laterality: Left;  Marland Kitchen VIDEO BRONCHOSCOPY N/A 11/13/2019   Procedure: VIDEO BRONCHOSCOPY;  Surgeon: Grace Isaac, MD;  Location: [facility];  Service: Thoracic;  Laterality: N/A;    REVIEW OF SYSTEMS:  A comprehensive review of systems was negative except for: Constitutional: positive for fatigue   PHYSICAL EXAMINATION: General appearance: alert, cooperative and no distress Head: Normocephalic, without obvious abnormality, atraumatic Neck: no adenopathy, no JVD, supple, symmetrical, trachea midline and thyroid not enlarged, symmetric, no tenderness/mass/nodules Lymph nodes: Cervical, supraclavicular, and axillary nodes normal. Resp: clear to auscultation bilaterally Back: symmetric, no curvature. ROM normal. No CVA tenderness. Cardio: regular rate and rhythm, S1, S2 normal, no murmur, click, rub or gallop GI: soft, non-tender; bowel sounds normal; no masses,  no organomegaly Extremities: extremities normal, atraumatic, no cyanosis or edema  ECOG PERFORMANCE STATUS: 1 - Symptomatic but completely ambulatory  Blood pressure 130/69, pulse 80, temperature 98.6 F (37 C), temperature source Tympanic, resp. rate 17, height 5\' 7"  (1.702 m), weight 188 lb 3.2 oz (85.4 kg), SpO2 99 %.  LABORATORY DATA: Lab Results  Component Value Date   WBC 7.8 12/27/2019   HGB 13.4 12/27/2019   HCT 39.0 12/27/2019   MCV 87.1 12/27/2019   PLT 351 12/27/2019      Chemistry      Component Value Date/Time   NA 137 12/20/2019 0746   NA 141 09/24/2019 0832   K 4.2 12/20/2019 0746   CL 102 12/20/2019 0746   CO2 28 12/20/2019 0746   BUN 12 12/20/2019 0746   BUN 17 09/24/2019 0832   CREATININE 0.88 12/20/2019 0746   CREATININE 1.01 12/24/2014 0916      Component Value Date/Time   CALCIUM 9.0 12/20/2019 0746   ALKPHOS 82 12/20/2019 0746   AST 27 12/20/2019 0746   ALT 55 (H) 12/20/2019 0746   BILITOT 0.2 (L) 12/20/2019 0746       RADIOGRAPHIC STUDIES: NM PET Image Initial (PI)  Skull Base To Thigh  Result Date: 11/29/2019 CLINICAL DATA:  Initial treatment strategy for non-small-cell lung cancer. EXAM: NUCLEAR MEDICINE PET SKULL BASE TO THIGH TECHNIQUE: 9.3 mCi F-18 FDG was injected intravenously. Full-ring PET imaging was performed from the skull base to thigh after the radiotracer. CT data was obtained and used for attenuation correction and anatomic localization. Fasting blood glucose: 122 mg/dl COMPARISON:  CT chest 10/30/2019. FINDINGS: Mediastinal blood pool activity: SUV max 2.5 Liver activity: SUV max NA NECK: No hypermetabolic lymph nodes in the neck. Incidental CT findings: none CHEST: Nodular pleural thickening in the left hemithorax is hypermetabolic with prominent hypermetabolic posteromedial nodule (image 84/4) demonstrating SUV max = 11.5. Pleura in the posterior costophrenic sulcus demonstrates SUV max = 7.4. No hypermetabolic lymphadenopathy in the mediastinum or right hilum. No hypermetabolic axillary lymphadenopathy. Incidental CT findings: Left Port-A-Cath tip is positioned in the proximal SVC. Coronary  artery calcification is evident. High attenuation material in the left pleural space is compatible with pleurodesis. Small amount of pleural gas seen posteriorly towards the base likely related to the presence of a previous chest tube and pleurodesis. ABDOMEN/PELVIS: No abnormal hypermetabolic activity within the liver, pancreas, adrenal glands, or spleen. No hypermetabolic lymph nodes in the abdomen or pelvis. Incidental CT findings: Diverticular changes noted left colon. Atherosclerotic calcification evident in the abdominal aorta without aneurysm. SKELETON: No focal hypermetabolic activity to suggest skeletal metastasis. Incidental CT findings: No worrisome lytic or sclerotic osseous abnormality. IMPRESSION: 1. Diffuse hypermetabolism in the nodular pleural thickening of the left hemithorax. No evidence for hypermetabolic hilar or mediastinal lymphadenopathy. 2. No  hypermetabolic metastatic disease in the neck, abdomen, or pelvis. 3. Left colonic diverticulosis without diverticulitis. 4.  Aortic Atherosclerois (ICD10-170.0) Electronically Signed   By: Misty Stanley M.D.   On: 11/29/2019 11:40    ASSESSMENT AND PLAN: This is a very pleasant 79 years old white male recently diagnosed with malignant pleural mesothelioma involving the left hemithorax. The patient is currently undergoing systemic chemotherapy with carboplatin for AUC of 5, Alimta 500 mg/M2 and Avastin 15 mg/KG every 3 weeks.  Status post 1 cycle. He tolerated the first cycle of his treatment well. I recommended for him to proceed with cycle #2 today as planned. For the hiccups he was advised to call if he has recurrence of the hiccups after the treatment and will consider him for treatment with Thorazine or baclofen. For the oral thrush he was treated with Diflucan and will give him referral if he has recurrence of this condition. The patient voices understanding of current disease status and treatment options and is in agreement with the current care plan.  All questions were answered. The patient knows to call the clinic with any problems, questions or concerns. We can certainly see the patient much sooner if necessary.   Disclaimer: This note was dictated with voice recognition software. Similar sounding words can inadvertently be transcribed and may not be corrected upon review.

## 2020-01-02 ENCOUNTER — Other Ambulatory Visit: Payer: Self-pay | Admitting: Physician Assistant

## 2020-01-02 ENCOUNTER — Telehealth: Payer: Self-pay | Admitting: Medical Oncology

## 2020-01-02 DIAGNOSIS — B370 Candidal stomatitis: Secondary | ICD-10-CM

## 2020-01-02 MED ORDER — FLUCONAZOLE 100 MG PO TABS: 100.0000 mg | ORAL_TABLET | Freq: Every day | ORAL | 0 refills | Status: DC

## 2020-01-02 NOTE — Telephone Encounter (Addendum)
Mouth sores erupted 2 days after recent infusion.Salt h2o rinses not helpful. Requests refill Diflucan which helped the sores after 1st treatment.

## 2020-01-03 ENCOUNTER — Inpatient Hospital Stay: Payer: Medicare PPO

## 2020-01-03 ENCOUNTER — Other Ambulatory Visit: Payer: Self-pay

## 2020-01-03 DIAGNOSIS — R0602 Shortness of breath: Secondary | ICD-10-CM | POA: Diagnosis not present

## 2020-01-03 DIAGNOSIS — Z8546 Personal history of malignant neoplasm of prostate: Secondary | ICD-10-CM | POA: Diagnosis not present

## 2020-01-03 DIAGNOSIS — R066 Hiccough: Secondary | ICD-10-CM | POA: Diagnosis not present

## 2020-01-03 DIAGNOSIS — M129 Arthropathy, unspecified: Secondary | ICD-10-CM | POA: Diagnosis not present

## 2020-01-03 DIAGNOSIS — Z79899 Other long term (current) drug therapy: Secondary | ICD-10-CM | POA: Diagnosis not present

## 2020-01-03 DIAGNOSIS — C457 Mesothelioma of other sites: Secondary | ICD-10-CM

## 2020-01-03 DIAGNOSIS — Z5111 Encounter for antineoplastic chemotherapy: Secondary | ICD-10-CM | POA: Diagnosis not present

## 2020-01-03 DIAGNOSIS — I10 Essential (primary) hypertension: Secondary | ICD-10-CM | POA: Diagnosis not present

## 2020-01-03 DIAGNOSIS — C450 Mesothelioma of pleura: Secondary | ICD-10-CM | POA: Diagnosis not present

## 2020-01-03 LAB — CBC WITH DIFFERENTIAL (CANCER CENTER ONLY)
Abs Immature Granulocytes: 0 10*3/uL (ref 0.00–0.07)
Basophils Absolute: 0 10*3/uL (ref 0.0–0.1)
Basophils Relative: 0 %
Eosinophils Absolute: 0.1 10*3/uL (ref 0.0–0.5)
Eosinophils Relative: 2 %
HCT: 39.3 % (ref 39.0–52.0)
Hemoglobin: 13.6 g/dL (ref 13.0–17.0)
Immature Granulocytes: 0 %
Lymphocytes Relative: 40 %
Lymphs Abs: 1.2 10*3/uL (ref 0.7–4.0)
MCH: 29.9 pg (ref 26.0–34.0)
MCHC: 34.6 g/dL (ref 30.0–36.0)
MCV: 86.4 fL (ref 80.0–100.0)
Monocytes Absolute: 0.2 10*3/uL (ref 0.1–1.0)
Monocytes Relative: 6 %
Neutro Abs: 1.5 10*3/uL — ABNORMAL LOW (ref 1.7–7.7)
Neutrophils Relative %: 52 %
Platelet Count: 103 10*3/uL — ABNORMAL LOW (ref 150–400)
RBC: 4.55 MIL/uL (ref 4.22–5.81)
RDW: 11.9 % (ref 11.5–15.5)
WBC Count: 2.9 10*3/uL — ABNORMAL LOW (ref 4.0–10.5)
nRBC: 0 % (ref 0.0–0.2)

## 2020-01-03 LAB — CMP (CANCER CENTER ONLY)
ALT: 42 U/L (ref 0–44)
AST: 26 U/L (ref 15–41)
Albumin: 3.4 g/dL — ABNORMAL LOW (ref 3.5–5.0)
Alkaline Phosphatase: 69 U/L (ref 38–126)
Anion gap: 10 (ref 5–15)
BUN: 14 mg/dL (ref 8–23)
CO2: 24 mmol/L (ref 22–32)
Calcium: 9.1 mg/dL (ref 8.9–10.3)
Chloride: 102 mmol/L (ref 98–111)
Creatinine: 0.81 mg/dL (ref 0.61–1.24)
GFR, Estimated: >60 mL/min (ref >60)
Glucose, Bld: 152 mg/dL — ABNORMAL HIGH (ref 70–99)
Potassium: 4.1 mmol/L (ref 3.5–5.1)
Sodium: 136 mmol/L (ref 135–145)
Total Bilirubin: 0.4 mg/dL (ref 0.3–1.2)
Total Protein: 6.7 g/dL (ref 6.5–8.1)

## 2020-01-03 LAB — TOTAL PROTEIN, URINE DIPSTICK: Protein, ur: NEGATIVE mg/dL

## 2020-01-08 ENCOUNTER — Other Ambulatory Visit: Payer: Self-pay

## 2020-01-08 ENCOUNTER — Encounter: Payer: Self-pay | Admitting: Emergency Medicine

## 2020-01-08 ENCOUNTER — Ambulatory Visit: Payer: Medicare PPO | Admitting: Emergency Medicine

## 2020-01-08 VITALS — BP 120/77 | HR 83 | Temp 97.2°F | Resp 16 | Ht 67.0 in | Wt 189.0 lb

## 2020-01-08 DIAGNOSIS — Z8546 Personal history of malignant neoplasm of prostate: Secondary | ICD-10-CM

## 2020-01-08 DIAGNOSIS — I2510 Atherosclerotic heart disease of native coronary artery without angina pectoris: Secondary | ICD-10-CM | POA: Diagnosis not present

## 2020-01-08 DIAGNOSIS — C457 Mesothelioma of other sites: Secondary | ICD-10-CM | POA: Diagnosis not present

## 2020-01-08 DIAGNOSIS — I10 Essential (primary) hypertension: Secondary | ICD-10-CM

## 2020-01-08 MED ORDER — LISINOPRIL 40 MG PO TABS: 40.0000 mg | ORAL_TABLET | Freq: Every day | ORAL | 3 refills | Status: DC

## 2020-01-08 MED ORDER — ROSUVASTATIN CALCIUM 10 MG PO TABS: 10.0000 mg | ORAL_TABLET | Freq: Every day | ORAL | 3 refills | Status: DC

## 2020-01-08 NOTE — Patient Instructions (Addendum)
   If you have lab work done today you will be contacted with your lab results within the next 2 weeks.  If you have not heard from us then please contact us. The fastest way to get your results is to register for My Chart.   IF you received an x-ray today, you will receive an invoice from [facility]. Please contact [facility] at [phone] with questions or concerns regarding your invoice.   IF you received labwork today, you will receive an invoice from [facility]. Please contact [facility] at [phone] with questions or concerns regarding your invoice.   Our billing staff will not be able to assist you with questions regarding bills from these companies.  You will be contacted with the lab results as soon as they are available. The fastest way to get your results is to activate your My Chart account. Instructions are located on the last page of this paperwork. If you have not heard from us regarding the results in 2 weeks, please contact this office.      Hypertension, Adult High blood pressure (hypertension) is when the force of blood pumping through the arteries is too strong. The arteries are the blood vessels that carry blood from the heart throughout the body. Hypertension forces the heart to work harder to pump blood and may cause arteries to become narrow or stiff. Untreated or uncontrolled hypertension can cause a heart attack, heart failure, a stroke, kidney disease, and other problems. A blood pressure reading consists of a higher number over a lower number. Ideally, your blood pressure should be below 120/80. The first ("top") number is called the systolic pressure. It is a measure of the pressure in your arteries as your heart beats. The second ("bottom") number is called the diastolic pressure. It is a measure of the pressure in your arteries as the heart relaxes. What are the causes? The exact cause of this condition is not known. There are some conditions  that result in or are related to high blood pressure. What increases the risk? Some risk factors for high blood pressure are under your control. The following factors may make you more likely to develop this condition:  Smoking.  Having type 2 diabetes mellitus, high cholesterol, or both.  Not getting enough exercise or physical activity.  Being overweight.  Having too much fat, sugar, calories, or salt (sodium) in your diet.  Drinking too much alcohol. Some risk factors for high blood pressure may be difficult or impossible to change. Some of these factors include:  Having chronic kidney disease.  Having a family history of high blood pressure.  Age. Risk increases with age.  Race. You may be at higher risk if you are African American.  Gender. Men are at higher risk than women before age 45. After age 65, women are at higher risk than men.  Having obstructive sleep apnea.  Stress. What are the signs or symptoms? High blood pressure may not cause symptoms. Very high blood pressure (hypertensive crisis) may cause:  Headache.  Anxiety.  Shortness of breath.  Nosebleed.  Nausea and vomiting.  Vision changes.  Severe chest pain.  Seizures. How is this diagnosed? This condition is diagnosed by measuring your blood pressure while you are seated, with your arm resting on a flat surface, your legs uncrossed, and your feet flat on the floor. The cuff of the blood pressure monitor will be placed directly against the skin of your upper arm at the level of your   heart. It should be measured at least twice using the same arm. Certain conditions can cause a difference in blood pressure between your right and left arms. Certain factors can cause blood pressure readings to be lower or higher than normal for a short period of time:  When your blood pressure is higher when you are in a health care provider's office than when you are at home, this is called white coat hypertension.  Most people with this condition do not need medicines.  When your blood pressure is higher at home than when you are in a health care provider's office, this is called masked hypertension. Most people with this condition may need medicines to control blood pressure. If you have a high blood pressure reading during one visit or you have normal blood pressure with other risk factors, you may be asked to:  Return on a different day to have your blood pressure checked again.  Monitor your blood pressure at home for 1 week or longer. If you are diagnosed with hypertension, you may have other blood or imaging tests to help your health care provider understand your overall risk for other conditions. How is this treated? This condition is treated by making healthy lifestyle changes, such as eating healthy foods, exercising more, and reducing your alcohol intake. Your health care provider may prescribe medicine if lifestyle changes are not enough to get your blood pressure under control, and if:  Your systolic blood pressure is above 130.  Your diastolic blood pressure is above 80. Your personal target blood pressure may vary depending on your medical conditions, your age, and other factors. Follow these instructions at home: Eating and drinking   Eat a diet that is high in fiber and potassium, and low in sodium, added sugar, and fat. An example eating plan is called the DASH (Dietary Approaches to Stop Hypertension) diet. To eat this way: ? Eat plenty of fresh fruits and vegetables. Try to fill one half of your plate at each meal with fruits and vegetables. ? Eat whole grains, such as whole-wheat pasta, brown rice, or whole-grain bread. Fill about one fourth of your plate with whole grains. ? Eat or drink low-fat dairy products, such as skim milk or low-fat yogurt. ? Avoid fatty cuts of meat, processed or cured meats, and poultry with skin. Fill about one fourth of your plate with lean proteins, such  as fish, chicken without skin, beans, eggs, or tofu. ? Avoid pre-made and processed foods. These tend to be higher in sodium, added sugar, and fat.  Reduce your daily sodium intake. Most people with hypertension should eat less than 1,500 mg of sodium a day.  Do not drink alcohol if: ? Your health care provider tells you not to drink. ? You are pregnant, may be pregnant, or are planning to become pregnant.  If you drink alcohol: ? Limit how much you use to:  0-1 drink a day for women.  0-2 drinks a day for men. ? Be aware of how much alcohol is in your drink. In the [place] one drink equals one 12 oz bottle of beer (355 mL), one 5 oz glass of wine (148 mL), or one 1 oz glass of hard liquor (44 mL). Lifestyle   Work with your health care provider to maintain a healthy body weight or to lose weight. Ask what an ideal weight is for you.  Get at least 30 minutes of exercise most days of the week. Activities may include walking, swimming,   or biking.  Include exercise to strengthen your muscles (resistance exercise), such as Pilates or lifting weights, as part of your weekly exercise routine. Try to do these types of exercises for 30 minutes at least 3 days a week.  Do not use any products that contain nicotine or tobacco, such as cigarettes, e-cigarettes, and chewing tobacco. If you need help quitting, ask your health care provider.  Monitor your blood pressure at home as told by your health care provider.  Keep all follow-up visits as told by your health care provider. This is important. Medicines  Take over-the-counter and prescription medicines only as told by your health care provider. Follow directions carefully. Blood pressure medicines must be taken as prescribed.  Do not skip doses of blood pressure medicine. Doing this puts you at risk for problems and can make the medicine less effective.  Ask your health care provider about side effects or reactions to medicines that you  should watch for. Contact a health care provider if you:  Think you are having a reaction to a medicine you are taking.  Have headaches that keep coming back (recurring).  Feel dizzy.  Have swelling in your ankles.  Have trouble with your vision. Get help right away if you:  Develop a severe headache or confusion.  Have unusual weakness or numbness.  Feel faint.  Have severe pain in your chest or abdomen.  Vomit repeatedly.  Have trouble breathing. Summary  Hypertension is when the force of blood pumping through your arteries is too strong. If this condition is not controlled, it may put you at risk for serious complications.  Your personal target blood pressure may vary depending on your medical conditions, your age, and other factors. For most people, a normal blood pressure is less than 120/80.  Hypertension is treated with lifestyle changes, medicines, or a combination of both. Lifestyle changes include losing weight, eating a healthy, low-sodium diet, exercising more, and limiting alcohol. This information is not intended to replace advice given to you by your health care provider. Make sure you discuss any questions you have with your health care provider. Document Revised: 10/12/2017 Document Reviewed: 10/12/2017 Elsevier Patient Education  2020 [employer]  

## 2020-01-08 NOTE — Assessment & Plan Note (Signed)
Elevated systolic blood pressure in the office and at home. We will increase dose of lisinopril to 40 mg daily. Continue monitoring blood pressure readings at home. Follow-up in 6 months but earlier as needed.

## 2020-01-08 NOTE — Progress Notes (Signed)
Jack Haynes 79 y.o.   Chief Complaint  Patient presents with  . Hypertension    follow up 3 month    HISTORY OF PRESENT ILLNESS: This is a 79 y.o. male with history of hypertension and dyslipidemia here for follow-up. Also recently diagnosed with left lung mesothelioma.  Presently under treatment and doing well. Takes lisinopril 20 mg daily for hypertension.  Blood pressure readings at home with elevated systolics. Takes rosuvastatin 10 mg daily for atherosclerotic disease. Has no complaints or medical concerns today. Fully vaccinated against Covid. BP Readings from Last 3 Encounters:  01/08/20 (!) 145/77  12/27/19 (!) 141/73  12/27/19 130/69    HPI   Prior to Admission medications   Medication Sig Start Date End Date Taking? Authorizing Provider  acetaminophen (TYLENOL) 500 MG tablet Take 2 tablets (1,000 mg total) by mouth every 6 (six) hours as needed for mild pain or fever. 11/15/19  Yes Barrett, Erin R, PA-C  dexamethasone (DECADRON) 4 MG tablet 1 tablet p.o. twice daily the day before, day of and day after chemotherapy every 3 weeks 11/23/19  Yes Curt Bears, MD  fluconazole (DIFLUCAN) 100 MG tablet Take 1 tablet (100 mg total) by mouth daily. 01/02/20  Yes Heilingoetter, Cassandra L, PA-C  folic acid (FOLVITE) 1 MG tablet Take 1 tablet (1 mg total) by mouth daily. 11/23/19  Yes Curt Bears, MD  lidocaine-prilocaine (EMLA) cream Apply to Port-A-Cath site 30-60-minute before treatment. 11/23/19  Yes Curt Bears, MD  lisinopril (ZESTRIL) 20 MG tablet Take 1 tablet (20 mg total) by mouth daily. 10/29/19  Yes Trisha Morandi, Ines Bloomer, MD  Misc Natural Products (NEURIVA PO) Take 1 capsule by mouth daily.    Yes [provider]  prochlorperazine (COMPAZINE) 10 MG tablet Take 1 tablet (10 mg total) by mouth every 6 (six) hours as needed for nausea or vomiting. 11/23/19  Yes Curt Bears, MD  rosuvastatin (CRESTOR) 10 MG tablet Take 1 tablet (10 mg total) by  mouth daily. 10/29/19  Yes SagardiaInes Bloomer, MD    No Known Allergies  Patient Active Problem List   Diagnosis Date Noted  . Mesothelioma of left lung ([place]) 11/23/2019  . Encounter for antineoplastic chemotherapy 11/23/2019  . Goals of care, counseling/discussion 11/23/2019  . S/P Left VATS drainage of pleural effusion, pleural biopsy, placement of Pleur-x catheter 11/15/2019  . Atherosclerotic cardiovascular disease 10/29/2019  . Pleural effusion 10/29/2019  . History of prostate cancer 10/29/2019  . Essential hypertension 10/09/2019  . Abnormal EKG 01/02/2015  . Prostate cancer ([place]) 11/01/2011    Past Medical History:  Diagnosis Date  . Arthritis   . Cancer (HCC)    hx of prostatae cancer  . Dyspnea    until chest tube placed  . Hypertension   . Mesothelioma [facility])     Past Surgical History:  Procedure Laterality Date  . APPENDECTOMY    . BACK SURGERY  1998   disectomy  . CHEST TUBE INSERTION Left 11/13/2019   Procedure: INSERTION PLEURAL DRAINAGE CATHETER;  Surgeon: Grace Isaac, MD;  Location: [place];  Service: Thoracic;  Laterality: Left;  . EYE SURGERY Left 1965   injury  . JOINT REPLACEMENT Left 2010   left knee replacement  . KNEE SURGERY    . PLEURAL BIOPSY Left 11/13/2019   Procedure: PLEURAL BIOPSY;  Surgeon: Grace Isaac, MD;  Location: [place];  Service: Thoracic;  Laterality: Left;  . PORTACATH PLACEMENT Left 11/26/2019   Procedure: INSERTION PORT-A-CATH BARD POWERPORT 9.6  FR CATHETER;  Surgeon: Grace Isaac, MD;  Location: [place];  Service: Thoracic;  Laterality: Left;  . PROSTATE SURGERY    . REMOVAL OF PLEURAL DRAINAGE CATHETER Left 11/26/2019   Procedure: REMOVAL OF PLEURAL DRAINAGE CATHETER;  Surgeon: Grace Isaac, MD;  Location: [place];  Service: Thoracic;  Laterality: Left;  . SPINE SURGERY    . TALC PLEURODESIS Left 11/13/2019   Procedure: possible TALC PLEURADESIS;  Surgeon: Grace Isaac, MD;  Location: [place];   Service: Thoracic;  Laterality: Left;  . TOTAL KNEE ARTHROPLASTY  02/01/2012   Procedure: TOTAL KNEE ARTHROPLASTY;  Surgeon: Sydnee Cabal, MD;  Location: [facility];  Service: Orthopedics;  Laterality: Left;  Marland Kitchen VIDEO ASSISTED THORACOSCOPY Left 11/13/2019   Procedure: VIDEO ASSISTED THORACOSCOPY;  Surgeon: Grace Isaac, MD;  Location: [place];  Service: Thoracic;  Laterality: Left;  Marland Kitchen VIDEO BRONCHOSCOPY N/A 11/13/2019   Procedure: VIDEO BRONCHOSCOPY;  Surgeon: Grace Isaac, MD;  Location: [facility];  Service: Thoracic;  Laterality: N/A;    Social History   Socioeconomic History  . Marital status: Divorced    Spouse name: Not on file  . Number of children: 2  . Years of education: 33  . Highest education level: Not on file  Occupational History  . 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   Tobacco Use  . Smoking status: Former Smoker    Years: 10.00    Quit date: 02/15/1981    Years since quitting: 38.9  . Smokeless tobacco: Former Systems developer    Types: [place] date: 12/2018  Vaping Use  . Vaping Use: Never used  Substance and Sexual Activity  . Alcohol use: Yes    Alcohol/week: 3.0 standard drinks    Types: 3 Shots of liquor per week  . Drug use: No  . Sexual activity: Not on file  Other Topics Concern  . Not on file  Social History Narrative  . Not on file   Social Determinants of Health   Financial Resource Strain:   . Difficulty of Paying Living Expenses: Not on file  Food Insecurity:   . Worried About Charity fundraiser in the Last Year: Not on file  . Ran Out of Food in the Last Year: Not on file  Transportation Needs:   . Lack of Transportation (Medical): Not on file  . Lack of Transportation (Non-Medical): Not on file  Physical Activity:   . Days of Exercise per Week: Not on file  . Minutes of Exercise per Session: Not on file  Stress:   . Feeling of Stress : Not on file  Social Connections:   . Frequency of Communication with Friends and  Family: Not on file  . Frequency of Social Gatherings with Friends and Family: Not on file  . Attends Religious Services: Not on file  . Active Member of Clubs or Organizations: Not on file  . Attends Archivist Meetings: Not on file  . Marital Status: Not on file  Intimate Partner Violence:   . Fear of Current or Ex-Partner: Not on file  . Emotionally Abused: Not on file  . Physically Abused: Not on file  . Sexually Abused: Not on file    Family History  Problem Relation Age of Onset  . Cancer Sister        brain tumor  . Heart disease Brother   . Alzheimer's disease Brother   . Heart disease Sister   .  Alzheimer's disease Father   . Hypertension Sister   . Colon cancer Neg Hx   . Colon polyps Neg Hx   . Esophageal cancer Neg Hx   . Rectal cancer Neg Hx   . Stomach cancer Neg Hx      Review of Systems  Constitutional: Negative.  Negative for chills and fever.  HENT: Negative.  Negative for congestion and sore throat.   Respiratory: Negative.  Negative for cough and shortness of breath.   Cardiovascular: Negative.  Negative for chest pain and palpitations.  Gastrointestinal: Negative.  Negative for abdominal pain, blood in stool, diarrhea, nausea and vomiting.  Genitourinary: Negative.  Negative for dysuria and hematuria.  Skin: Negative.  Negative for rash.  Neurological: Negative.  Negative for dizziness and headaches.  All other systems reviewed and are negative.   Today's Vitals   01/08/20 0804  BP: (!) 145/77  Pulse: 83  Resp: 16  Temp: (!) 97.2 F (36.2 C)  TempSrc: Temporal  SpO2: 96%  Weight: 189 lb (85.7 kg)  Height: 5\' 7"  (1.702 m)   Body mass index is 29.6 kg/m.  Physical Exam Vitals reviewed.  Constitutional:      Appearance: Normal appearance.  HENT:     Head: Normocephalic.  Eyes:     Extraocular Movements: Extraocular movements intact.     Conjunctiva/sclera: Conjunctivae normal.     Pupils: Pupils are equal, round, and  reactive to light.  Cardiovascular:     Rate and Rhythm: Normal rate and regular rhythm.     Pulses: Normal pulses.     Heart sounds: Normal heart sounds.  Pulmonary:     Effort: Pulmonary effort is normal.     Breath sounds: Normal breath sounds.     Comments: Port-A-Cath on left side Musculoskeletal:        General: Normal range of motion.     Cervical back: Normal range of motion and neck supple.  Skin:    General: Skin is warm and dry.  Neurological:     General: No focal deficit present.     Mental Status: He is alert and oriented to person, place, and time.  Psychiatric:        Mood and Affect: Mood normal.        Behavior: Behavior normal.    A total of 30 minutes was spent with the patient, greater than 50% of which was in counseling/coordination of care regarding multiple chronic medical problems and cardiovascular risks associated with these conditions, review of all medications, dose change of lisinopril, review of most recent office visit notes, review of oncologist's office visit notes, health maintenance items, prognosis, documentation, need for follow-up.   ASSESSMENT & PLAN:  Essential hypertension Elevated systolic blood pressure in the office and at home. We will increase dose of lisinopril to 40 mg daily. Continue monitoring blood pressure readings at home. Follow-up in 6 months but earlier as needed.  Keniel was seen today for hypertension.  Diagnoses and all orders for this visit:  Essential hypertension -     lisinopril (ZESTRIL) 40 MG tablet; Take 1 tablet (40 mg total) by mouth daily.  Atherosclerotic cardiovascular disease -     rosuvastatin (CRESTOR) 10 MG tablet; Take 1 tablet (10 mg total) by mouth daily.  History of prostate cancer  Mesothelioma of left lung [facility])  Uncontrolled hypertension    Patient Instructions       If you have lab work done today you will be contacted with your lab  results within the next 2 weeks.  If you  have not heard from [place] then please contact us. The fastest way to get your results is to register for My Chart.   IF you received an x-ray today, you will receive an invoice from [facility]. Please contact [facility] at [phone] with questions or concerns regarding your invoice.   IF you received labwork today, you will receive an invoice from [facility]. Please contact [facility] at [phone] with questions or concerns regarding your invoice.   Our billing staff will not be able to assist you with questions regarding bills from these companies.  You will be contacted with the lab results as soon as they are available. The fastest way to get your results is to activate your My Chart account. Instructions are located on the last page of this paperwork. If you have not heard from [place] regarding the results in 2 weeks, please contact this office.     Hypertension, Adult High blood pressure (hypertension) is when the force of blood pumping through the arteries is too strong. The arteries are the blood vessels that carry blood from the heart throughout the body. Hypertension forces the heart to work harder to pump blood and may cause arteries to become narrow or stiff. Untreated or uncontrolled hypertension can cause a heart attack, heart failure, a stroke, kidney disease, and other problems. A blood pressure reading consists of a higher number over a lower number. Ideally, your blood pressure should be below 120/80. The first ("top") number is called the systolic pressure. It is a measure of the pressure in your arteries as your heart beats. The second ("bottom") number is called the diastolic pressure. It is a measure of the pressure in your arteries as the heart relaxes. What are the causes? The exact cause of this condition is not known. There are some conditions that result in or are related to high blood pressure. What increases the risk? Some risk factors for high blood  pressure are under your control. The following factors may make you more likely to develop this condition:  Smoking.  Having type 2 diabetes mellitus, high cholesterol, or both.  Not getting enough exercise or physical activity.  Being overweight.  Having too much fat, sugar, calories, or salt (sodium) in your diet.  Drinking too much alcohol. Some risk factors for high blood pressure may be difficult or impossible to change. Some of these factors include:  Having chronic kidney disease.  Having a family history of high blood pressure.  Age. Risk increases with age.  Race. You may be at higher risk if you are African American.  Gender. Men are at higher risk than women before age 3. After age 52, women are at higher risk than men.  Having obstructive sleep apnea.  Stress. What are the signs or symptoms? High blood pressure may not cause symptoms. Very high blood pressure (hypertensive crisis) may cause:  Headache.  Anxiety.  Shortness of breath.  Nosebleed.  Nausea and vomiting.  Vision changes.  Severe chest pain.  Seizures. How is this diagnosed? This condition is diagnosed by measuring your blood pressure while you are seated, with your arm resting on a flat surface, your legs uncrossed, and your feet flat on the floor. The cuff of the blood pressure monitor will be placed directly against the skin of your upper arm at the level of your heart. It should be measured at least twice using the same arm. Certain conditions can cause a  difference in blood pressure between your right and left arms. Certain factors can cause blood pressure readings to be lower or higher than normal for a short period of time:  When your blood pressure is higher when you are in a health care provider's office than when you are at home, this is called white coat hypertension. Most people with this condition do not need medicines.  When your blood pressure is higher at home than when you  are in a health care provider's office, this is called masked hypertension. Most people with this condition may need medicines to control blood pressure. If you have a high blood pressure reading during one visit or you have normal blood pressure with other risk factors, you may be asked to:  Return on a different day to have your blood pressure checked again.  Monitor your blood pressure at home for 1 week or longer. If you are diagnosed with hypertension, you may have other blood or imaging tests to help your health care provider understand your overall risk for other conditions. How is this treated? This condition is treated by making healthy lifestyle changes, such as eating healthy foods, exercising more, and reducing your alcohol intake. Your health care provider may prescribe medicine if lifestyle changes are not enough to get your blood pressure under control, and if:  Your systolic blood pressure is above 130.  Your diastolic blood pressure is above 80. Your personal target blood pressure may vary depending on your medical conditions, your age, and other factors. Follow these instructions at home: Eating and drinking   Eat a diet that is high in fiber and potassium, and low in sodium, added sugar, and fat. An example eating plan is called the DASH (Dietary Approaches to Stop Hypertension) diet. To eat this way: ? Eat plenty of fresh fruits and vegetables. Try to fill one half of your plate at each meal with fruits and vegetables. ? Eat whole grains, such as whole-wheat pasta, brown rice, or whole-grain bread. Fill about one fourth of your plate with whole grains. ? Eat or drink low-fat dairy products, such as skim milk or low-fat yogurt. ? Avoid fatty cuts of meat, processed or cured meats, and poultry with skin. Fill about one fourth of your plate with lean proteins, such as fish, chicken without skin, beans, eggs, or tofu. ? Avoid pre-made and processed foods. These tend to be  higher in sodium, added sugar, and fat.  Reduce your daily sodium intake. Most people with hypertension should eat less than 1,500 mg of sodium a day.  Do not drink alcohol if: ? Your health care provider tells you not to drink. ? You are pregnant, may be pregnant, or are planning to become pregnant.  If you drink alcohol: ? Limit how much you use to:  0-1 drink a day for women.  0-2 drinks a day for men. ? Be aware of how much alcohol is in your drink. In the [place] one drink equals one 12 oz bottle of beer (355 mL), one 5 oz glass of wine (148 mL), or one 1 oz glass of hard liquor (44 mL). Lifestyle   Work with your health care provider to maintain a healthy body weight or to lose weight. Ask what an ideal weight is for you.  Get at least 30 minutes of exercise most days of the week. Activities may include walking, swimming, or biking.  Include exercise to strengthen your muscles (resistance exercise), such as Pilates or lifting weights,  as part of your weekly exercise routine. Try to do these types of exercises for 30 minutes at least 3 days a week.  Do not use any products that contain nicotine or tobacco, such as cigarettes, e-cigarettes, and chewing tobacco. If you need help quitting, ask your health care provider.  Monitor your blood pressure at home as told by your health care provider.  Keep all follow-up visits as told by your health care provider. This is important. Medicines  Take over-the-counter and prescription medicines only as told by your health care provider. Follow directions carefully. Blood pressure medicines must be taken as prescribed.  Do not skip doses of blood pressure medicine. Doing this puts you at risk for problems and can make the medicine less effective.  Ask your health care provider about side effects or reactions to medicines that you should watch for. Contact a health care provider if you:  Think you are having a reaction to a medicine you  are taking.  Have headaches that keep coming back (recurring).  Feel dizzy.  Have swelling in your ankles.  Have trouble with your vision. Get help right away if you:  Develop a severe headache or confusion.  Have unusual weakness or numbness.  Feel faint.  Have severe pain in your chest or abdomen.  Vomit repeatedly.  Have trouble breathing. Summary  Hypertension is when the force of blood pumping through your arteries is too strong. If this condition is not controlled, it may put you at risk for serious complications.  Your personal target blood pressure may vary depending on your medical conditions, your age, and other factors. For most people, a normal blood pressure is less than 120/80.  Hypertension is treated with lifestyle changes, medicines, or a combination of both. Lifestyle changes include losing weight, eating a healthy, low-sodium diet, exercising more, and limiting alcohol. This information is not intended to replace advice given to you by your health care provider. Make sure you discuss any questions you have with your health care provider. Document Revised: 10/12/2017 Document Reviewed: 10/12/2017 Elsevier Patient Education  2020 Elsevier Inc.     Agustina Caroli, MD [facility]

## 2020-01-09 ENCOUNTER — Inpatient Hospital Stay: Payer: Medicare PPO

## 2020-01-09 ENCOUNTER — Other Ambulatory Visit: Payer: Self-pay

## 2020-01-09 DIAGNOSIS — Z5111 Encounter for antineoplastic chemotherapy: Secondary | ICD-10-CM | POA: Diagnosis not present

## 2020-01-09 DIAGNOSIS — R066 Hiccough: Secondary | ICD-10-CM | POA: Diagnosis not present

## 2020-01-09 DIAGNOSIS — R0602 Shortness of breath: Secondary | ICD-10-CM | POA: Diagnosis not present

## 2020-01-09 DIAGNOSIS — Z79899 Other long term (current) drug therapy: Secondary | ICD-10-CM | POA: Diagnosis not present

## 2020-01-09 DIAGNOSIS — C457 Mesothelioma of other sites: Secondary | ICD-10-CM

## 2020-01-09 DIAGNOSIS — C450 Mesothelioma of pleura: Secondary | ICD-10-CM | POA: Diagnosis not present

## 2020-01-09 DIAGNOSIS — Z8546 Personal history of malignant neoplasm of prostate: Secondary | ICD-10-CM | POA: Diagnosis not present

## 2020-01-09 DIAGNOSIS — I10 Essential (primary) hypertension: Secondary | ICD-10-CM | POA: Diagnosis not present

## 2020-01-09 DIAGNOSIS — Z95828 Presence of other vascular implants and grafts: Secondary | ICD-10-CM | POA: Insufficient documentation

## 2020-01-09 DIAGNOSIS — M129 Arthropathy, unspecified: Secondary | ICD-10-CM | POA: Diagnosis not present

## 2020-01-09 LAB — CBC WITH DIFFERENTIAL (CANCER CENTER ONLY)
Abs Immature Granulocytes: 0.03 10*3/uL (ref 0.00–0.07)
Basophils Absolute: 0 10*3/uL (ref 0.0–0.1)
Basophils Relative: 1 %
Eosinophils Absolute: 0.1 10*3/uL (ref 0.0–0.5)
Eosinophils Relative: 2 %
HCT: 36.6 % — ABNORMAL LOW (ref 39.0–52.0)
Hemoglobin: 12.7 g/dL — ABNORMAL LOW (ref 13.0–17.0)
Immature Granulocytes: 1 %
Lymphocytes Relative: 38 %
Lymphs Abs: 1.2 10*3/uL (ref 0.7–4.0)
MCH: 30.5 pg (ref 26.0–34.0)
MCHC: 34.7 g/dL (ref 30.0–36.0)
MCV: 87.8 fL (ref 80.0–100.0)
Monocytes Absolute: 0.6 10*3/uL (ref 0.1–1.0)
Monocytes Relative: 20 %
Neutro Abs: 1.1 10*3/uL — ABNORMAL LOW (ref 1.7–7.7)
Neutrophils Relative %: 38 %
Platelet Count: 82 10*3/uL — ABNORMAL LOW (ref 150–400)
RBC: 4.17 MIL/uL — ABNORMAL LOW (ref 4.22–5.81)
RDW: 12.6 % (ref 11.5–15.5)
WBC Count: 3 10*3/uL — ABNORMAL LOW (ref 4.0–10.5)
nRBC: 0 % (ref 0.0–0.2)

## 2020-01-09 LAB — CMP (CANCER CENTER ONLY)
ALT: 37 U/L (ref 0–44)
AST: 20 U/L (ref 15–41)
Albumin: 3.3 g/dL — ABNORMAL LOW (ref 3.5–5.0)
Alkaline Phosphatase: 71 U/L (ref 38–126)
Anion gap: 11 (ref 5–15)
BUN: 13 mg/dL (ref 8–23)
CO2: 21 mmol/L — ABNORMAL LOW (ref 22–32)
Calcium: 8.4 mg/dL — ABNORMAL LOW (ref 8.9–10.3)
Chloride: 108 mmol/L (ref 98–111)
Creatinine: 0.9 mg/dL (ref 0.61–1.24)
GFR, Estimated: >60 mL/min (ref >60)
Glucose, Bld: 174 mg/dL — ABNORMAL HIGH (ref 70–99)
Potassium: 4.1 mmol/L (ref 3.5–5.1)
Sodium: 140 mmol/L (ref 135–145)
Total Bilirubin: 0.3 mg/dL (ref 0.3–1.2)
Total Protein: 6.5 g/dL (ref 6.5–8.1)

## 2020-01-09 MED ORDER — SODIUM CHLORIDE 0.9% FLUSH
10.0000 mL | Freq: Once | INTRAVENOUS | Status: AC
  Administered 2020-01-09: 10 mL
  Filled 2020-01-09: qty 10

## 2020-01-09 MED ORDER — HEPARIN SOD (PORK) LOCK FLUSH 100 UNIT/ML IV SOLN
500.0000 [IU] | Freq: Once | INTRAVENOUS | Status: AC
  Administered 2020-01-09: 500 [IU]
  Filled 2020-01-09: qty 5

## 2020-01-14 ENCOUNTER — Telehealth: Payer: Self-pay

## 2020-01-14 NOTE — Telephone Encounter (Signed)
Copied from [place] [phone]. Topic: General - Other >> Jan 09, 2020 10:14 AM Celene Kras wrote: Reason for CRM: Pt called and is requesting to speak with PCP. He states that he received a prescription for amlodipine. He states that he was never prescribed this and is requesting more info. Please advise.

## 2020-01-15 ENCOUNTER — Other Ambulatory Visit: Payer: Self-pay | Admitting: Emergency Medicine

## 2020-01-15 DIAGNOSIS — I10 Essential (primary) hypertension: Secondary | ICD-10-CM

## 2020-01-15 MED ORDER — LOSARTAN POTASSIUM-HCTZ 100-12.5 MG PO TABS: 1.0000 | ORAL_TABLET | Freq: Every day | ORAL | 3 refills | Status: DC

## 2020-01-15 NOTE — Telephone Encounter (Signed)
Called and informed pt and he was agreeable stated he would track bp next few days after starting this. Told pt I would call and check in a few days later

## 2020-01-15 NOTE — Telephone Encounter (Signed)
Will change medication to losartan/HCTZ 100-12.5 mg daily.  Thanks.

## 2020-01-15 NOTE — Telephone Encounter (Signed)
Pt reports was having higher readings with the increased lisinopril 40 mg, notes it has been 90 diastolic tried new dose several days no change then added 20 mg lisinopril in early afternoon, at about 2-3 and reports not much change pt took BP for me 138/94 pt does report systolic has jumped to 779 at the highest but has remained mostly 130 readings. Pt denied any physical symptoms.   Does pt need new appointment to evaluate changes?

## 2020-01-15 NOTE — Telephone Encounter (Signed)
What is the story with the prescription for amlodipine?  Who prescribed this medication?

## 2020-01-15 NOTE — Telephone Encounter (Signed)
Pt was not asking about amlodipine I did verify with him he was asking about the new dose of lisinopril. However it does say you prescribed the amlodipine but that it was discontinued I think this was a documenting error and was meant to be the lisinopril.

## 2020-01-17 ENCOUNTER — Inpatient Hospital Stay: Payer: Medicare PPO | Attending: Internal Medicine

## 2020-01-17 ENCOUNTER — Inpatient Hospital Stay: Payer: Medicare PPO | Admitting: Internal Medicine

## 2020-01-17 ENCOUNTER — Inpatient Hospital Stay: Payer: Medicare PPO

## 2020-01-17 ENCOUNTER — Encounter: Payer: Self-pay | Admitting: Internal Medicine

## 2020-01-17 ENCOUNTER — Telehealth: Payer: Self-pay | Admitting: Internal Medicine

## 2020-01-17 ENCOUNTER — Other Ambulatory Visit: Payer: Self-pay

## 2020-01-17 VITALS — BP 158/91 | HR 95 | Temp 97.1°F | Resp 20 | Wt 190.2 lb

## 2020-01-17 DIAGNOSIS — C450 Mesothelioma of pleura: Secondary | ICD-10-CM | POA: Diagnosis not present

## 2020-01-17 DIAGNOSIS — Z5112 Encounter for antineoplastic immunotherapy: Secondary | ICD-10-CM | POA: Insufficient documentation

## 2020-01-17 DIAGNOSIS — I10 Essential (primary) hypertension: Secondary | ICD-10-CM

## 2020-01-17 DIAGNOSIS — Z5111 Encounter for antineoplastic chemotherapy: Secondary | ICD-10-CM | POA: Diagnosis not present

## 2020-01-17 DIAGNOSIS — R0602 Shortness of breath: Secondary | ICD-10-CM | POA: Diagnosis not present

## 2020-01-17 DIAGNOSIS — C457 Mesothelioma of other sites: Secondary | ICD-10-CM

## 2020-01-17 DIAGNOSIS — M129 Arthropathy, unspecified: Secondary | ICD-10-CM | POA: Diagnosis not present

## 2020-01-17 DIAGNOSIS — Z79899 Other long term (current) drug therapy: Secondary | ICD-10-CM | POA: Insufficient documentation

## 2020-01-17 DIAGNOSIS — Z95828 Presence of other vascular implants and grafts: Secondary | ICD-10-CM

## 2020-01-17 LAB — CBC WITH DIFFERENTIAL (CANCER CENTER ONLY)
Abs Immature Granulocytes: 0.04 10*3/uL (ref 0.00–0.07)
Basophils Absolute: 0 10*3/uL (ref 0.0–0.1)
Basophils Relative: 0 %
Eosinophils Absolute: 0 10*3/uL (ref 0.0–0.5)
Eosinophils Relative: 0 %
HCT: 37.7 % — ABNORMAL LOW (ref 39.0–52.0)
Hemoglobin: 13.4 g/dL (ref 13.0–17.0)
Immature Granulocytes: 1 %
Lymphocytes Relative: 12 %
Lymphs Abs: 0.8 10*3/uL (ref 0.7–4.0)
MCH: 30.4 pg (ref 26.0–34.0)
MCHC: 35.5 g/dL (ref 30.0–36.0)
MCV: 85.5 fL (ref 80.0–100.0)
Monocytes Absolute: 0.4 10*3/uL (ref 0.1–1.0)
Monocytes Relative: 6 %
Neutro Abs: 5 10*3/uL (ref 1.7–7.7)
Neutrophils Relative %: 81 %
Platelet Count: 283 10*3/uL (ref 150–400)
RBC: 4.41 MIL/uL (ref 4.22–5.81)
RDW: 13.1 % (ref 11.5–15.5)
WBC Count: 6.1 10*3/uL (ref 4.0–10.5)
nRBC: 0 % (ref 0.0–0.2)

## 2020-01-17 LAB — CMP (CANCER CENTER ONLY)
ALT: 26 U/L (ref 0–44)
AST: 16 U/L (ref 15–41)
Albumin: 3.8 g/dL (ref 3.5–5.0)
Alkaline Phosphatase: 70 U/L (ref 38–126)
Anion gap: 12 (ref 5–15)
BUN: 18 mg/dL (ref 8–23)
CO2: 21 mmol/L — ABNORMAL LOW (ref 22–32)
Calcium: 9.2 mg/dL (ref 8.9–10.3)
Chloride: 104 mmol/L (ref 98–111)
Creatinine: 0.94 mg/dL (ref 0.61–1.24)
GFR, Estimated: >60 mL/min (ref >60)
Glucose, Bld: 211 mg/dL — ABNORMAL HIGH (ref 70–99)
Potassium: 4.3 mmol/L (ref 3.5–5.1)
Sodium: 137 mmol/L (ref 135–145)
Total Bilirubin: 0.4 mg/dL (ref 0.3–1.2)
Total Protein: 7 g/dL (ref 6.5–8.1)

## 2020-01-17 MED ORDER — CYANOCOBALAMIN 1000 MCG/ML IJ SOLN
INTRAMUSCULAR | Status: AC
  Filled 2020-01-17: qty 1

## 2020-01-17 MED ORDER — SODIUM CHLORIDE 0.9 % IV SOLN
481.5000 mg | Freq: Once | INTRAVENOUS | Status: AC
  Administered 2020-01-17: 480 mg via INTRAVENOUS
  Filled 2020-01-17: qty 48

## 2020-01-17 MED ORDER — SODIUM CHLORIDE 0.9 % IV SOLN
150.0000 mg | Freq: Once | INTRAVENOUS | Status: AC
  Administered 2020-01-17: 150 mg via INTRAVENOUS
  Filled 2020-01-17: qty 150

## 2020-01-17 MED ORDER — SODIUM CHLORIDE 0.9% FLUSH
10.0000 mL | INTRAVENOUS | Status: DC | PRN
  Administered 2020-01-17: 10 mL
  Filled 2020-01-17: qty 10

## 2020-01-17 MED ORDER — CYANOCOBALAMIN 1000 MCG/ML IJ SOLN
1000.0000 ug | Freq: Once | INTRAMUSCULAR | Status: AC
  Administered 2020-01-17: 1000 ug via INTRAMUSCULAR

## 2020-01-17 MED ORDER — SODIUM CHLORIDE 0.9 % IV SOLN
500.0000 mg/m2 | Freq: Once | INTRAVENOUS | Status: AC
  Administered 2020-01-17: 1000 mg via INTRAVENOUS
  Filled 2020-01-17: qty 40

## 2020-01-17 MED ORDER — PALONOSETRON HCL INJECTION 0.25 MG/5ML
INTRAVENOUS | Status: AC
  Filled 2020-01-17: qty 5

## 2020-01-17 MED ORDER — HEPARIN SOD (PORK) LOCK FLUSH 100 UNIT/ML IV SOLN
500.0000 [IU] | Freq: Once | INTRAVENOUS | Status: AC | PRN
  Administered 2020-01-17: 500 [IU]
  Filled 2020-01-17: qty 5

## 2020-01-17 MED ORDER — SODIUM CHLORIDE 0.9% FLUSH
10.0000 mL | Freq: Once | INTRAVENOUS | Status: DC
  Filled 2020-01-17: qty 10

## 2020-01-17 MED ORDER — SODIUM CHLORIDE 0.9 % IV SOLN
10.0000 mg | Freq: Once | INTRAVENOUS | Status: AC
  Administered 2020-01-17: 10 mg via INTRAVENOUS
  Filled 2020-01-17: qty 10

## 2020-01-17 MED ORDER — PALONOSETRON HCL INJECTION 0.25 MG/5ML
0.2500 mg | Freq: Once | INTRAVENOUS | Status: AC
  Administered 2020-01-17: 0.25 mg via INTRAVENOUS

## 2020-01-17 MED ORDER — SODIUM CHLORIDE 0.9 % IV SOLN
15.0000 mg/kg | Freq: Once | INTRAVENOUS | Status: AC
  Administered 2020-01-17: 1300 mg via INTRAVENOUS
  Filled 2020-01-17: qty 48

## 2020-01-17 MED ORDER — SODIUM CHLORIDE 0.9 % IV SOLN
Freq: Once | INTRAVENOUS | Status: AC
  Filled 2020-01-17: qty 250

## 2020-01-17 NOTE — Patient Instructions (Signed)

## 2020-01-17 NOTE — Telephone Encounter (Signed)
Scheduled appointments per 12/2 los. Spoke to patient's wife who is aware of appointments dates and times.

## 2020-01-17 NOTE — Progress Notes (Signed)
[place] [phone]   [phone]  OFFICE PROGRESS NOTE  [name], MD [place] [address]  DIAGNOSIS: Stage I/II malignant pleural mesothelioma involving the left hemithorax diagnosed in September 2021.  PRIOR THERAPY: None.  CURRENT THERAPY:   Systemic chemotherapy with carboplatin for AUC of 5, Alimta 500 mg/M2 and Avastin 15 mg/KG every 3 weeks.  Status post 2 cycles.  INTERVAL HISTORY: Jack Haynes 79 y.o. male returns to the clinic today for follow-up visit.  The patient is feeling fine today with no concerning complaints.  He tolerated the second cycle of his treatment fairly well except for fatigue for few days.  He denied having any current chest pain, shortness of breath, cough or hemoptysis.  He denied having any nausea, vomiting, diarrhea or constipation.  He has no headache or visual changes.  The patient is here today for evaluation before starting cycle #3.  MEDICAL HISTORY: Past Medical History:  Diagnosis Date  . Arthritis   . Cancer (HCC)    hx of prostatae cancer  . Dyspnea    until chest tube placed  . Hypertension   . Mesothelioma [facility])     ALLERGIES:  has No Known Allergies.  MEDICATIONS:  Current Outpatient Medications  Medication Sig Dispense Refill  . acetaminophen (TYLENOL) 500 MG tablet Take 2 tablets (1,000 mg total) by mouth every 6 (six) hours as needed for mild pain or fever. 30 tablet 0  . dexamethasone (DECADRON) 4 MG tablet 1 tablet p.o. twice daily the day before, day of and day after chemotherapy every 3 weeks 40 tablet 1  . fluconazole (DIFLUCAN) 100 MG tablet Take 1 tablet (100 mg total) by mouth daily. 7 tablet 0  . folic acid (FOLVITE) 1 MG tablet Take 1 tablet (1 mg total) by mouth daily. 30 tablet 4  . lidocaine-prilocaine (EMLA) cream Apply to Port-A-Cath site 30-60-minute before treatment. 30 g 0  . losartan-hydrochlorothiazide (HYZAAR) 100-12.5 MG tablet Take 1  tablet by mouth daily. 90 tablet 3  . Misc Natural Products (NEURIVA PO) Take 1 capsule by mouth daily.  (Patient not taking: Reported on 01/08/2020)    . prochlorperazine (COMPAZINE) 10 MG tablet Take 1 tablet (10 mg total) by mouth every 6 (six) hours as needed for nausea or vomiting. 30 tablet 0  . rosuvastatin (CRESTOR) 10 MG tablet Take 1 tablet (10 mg total) by mouth daily. 90 tablet 3   No current facility-administered medications for this visit.   Facility-Administered Medications Ordered in Other Visits  Medication Dose Route Frequency Provider Last Rate Last Admin  . sodium chloride flush (NS) 0.9 % injection 10 mL  10 mL Intracatheter Once Curt Bears, MD        SURGICAL HISTORY:  Past Surgical History:  Procedure Laterality Date  . APPENDECTOMY    . BACK SURGERY  1998   disectomy  . CHEST TUBE INSERTION Left 11/13/2019   Procedure: INSERTION PLEURAL DRAINAGE CATHETER;  Surgeon: Grace Isaac, MD;  Location: [place];  Service: Thoracic;  Laterality: Left;  . EYE SURGERY Left 1965   injury  . JOINT REPLACEMENT Left 2010   left knee replacement  . KNEE SURGERY    . PLEURAL BIOPSY Left 11/13/2019   Procedure: PLEURAL BIOPSY;  Surgeon: Grace Isaac, MD;  Location: [place];  Service: Thoracic;  Laterality: Left;  . PORTACATH PLACEMENT Left 11/26/2019   Procedure: INSERTION PORT-A-CATH BARD POWERPORT 9.6 FR CATHETER;  Surgeon: Grace Isaac, MD;  Location: [place];  Service: Thoracic;  Laterality: Left;  . PROSTATE SURGERY    . REMOVAL OF PLEURAL DRAINAGE CATHETER Left 11/26/2019   Procedure: REMOVAL OF PLEURAL DRAINAGE CATHETER;  Surgeon: Grace Isaac, MD;  Location: [place];  Service: Thoracic;  Laterality: Left;  . SPINE SURGERY    . TALC PLEURODESIS Left 11/13/2019   Procedure: possible TALC PLEURADESIS;  Surgeon: Grace Isaac, MD;  Location: [place];  Service: Thoracic;  Laterality: Left;  . TOTAL KNEE ARTHROPLASTY  02/01/2012   Procedure: TOTAL KNEE  ARTHROPLASTY;  Surgeon: Sydnee Cabal, MD;  Location: [facility];  Service: Orthopedics;  Laterality: Left;  Marland Kitchen VIDEO ASSISTED THORACOSCOPY Left 11/13/2019   Procedure: VIDEO ASSISTED THORACOSCOPY;  Surgeon: Grace Isaac, MD;  Location: [place];  Service: Thoracic;  Laterality: Left;  Marland Kitchen VIDEO BRONCHOSCOPY N/A 11/13/2019   Procedure: VIDEO BRONCHOSCOPY;  Surgeon: Grace Isaac, MD;  Location: [facility];  Service: Thoracic;  Laterality: N/A;    REVIEW OF SYSTEMS:  A comprehensive review of systems was negative except for: Constitutional: positive for fatigue   PHYSICAL EXAMINATION: General appearance: alert, cooperative and no distress Head: Normocephalic, without obvious abnormality, atraumatic Neck: no adenopathy, no JVD, supple, symmetrical, trachea midline and thyroid not enlarged, symmetric, no tenderness/mass/nodules Lymph nodes: Cervical, supraclavicular, and axillary nodes normal. Resp: clear to auscultation bilaterally Back: symmetric, no curvature. ROM normal. No CVA tenderness. Cardio: regular rate and rhythm, S1, S2 normal, no murmur, click, rub or gallop GI: soft, non-tender; bowel sounds normal; no masses,  no organomegaly Extremities: extremities normal, atraumatic, no cyanosis or edema  ECOG PERFORMANCE STATUS: 1 - Symptomatic but completely ambulatory  Blood pressure (!) 158/91, pulse 95, temperature (!) 97.1 F (36.2 C), temperature source Tympanic, resp. rate 20, weight 190 lb 3.2 oz (86.3 kg), SpO2 98 %.  LABORATORY DATA: Lab Results  Component Value Date   WBC 6.1 01/17/2020   HGB 13.4 01/17/2020   HCT 37.7 (L) 01/17/2020   MCV 85.5 01/17/2020   PLT 283 01/17/2020      Chemistry      Component Value Date/Time   NA 140 01/09/2020 0750   NA 141 09/24/2019 0832   K 4.1 01/09/2020 0750   CL 108 01/09/2020 0750   CO2 21 (L) 01/09/2020 0750   BUN 13 01/09/2020 0750   BUN 17 09/24/2019 0832   CREATININE 0.90 01/09/2020 0750   CREATININE 1.01 12/24/2014 0916       Component Value Date/Time   CALCIUM 8.4 (L) 01/09/2020 0750   ALKPHOS 71 01/09/2020 0750   AST 20 01/09/2020 0750   ALT 37 01/09/2020 0750   BILITOT 0.3 01/09/2020 0750       RADIOGRAPHIC STUDIES: No results found.  ASSESSMENT AND PLAN: This is a very pleasant 79 years old white male recently diagnosed with malignant pleural mesothelioma involving the left hemithorax. The patient is currently undergoing systemic chemotherapy with carboplatin for AUC of 5, Alimta 500 mg/M2 and Avastin 15 mg/KG every 3 weeks.  Status post 2 cycles. He tolerated the second cycle of his treatment well except for mild fatigue. I recommended for the patient to proceed with cycle #3 today as planned. I will see him back for follow-up visit in 3 weeks with repeat CT scan of the chest, abdomen pelvis for restaging of his disease. The patient was advised to call immediately if he has any concerning symptoms in the interval. The patient voices understanding of current  disease status and treatment options and is in agreement with the current care plan.  All questions were answered. The patient knows to call the clinic with any problems, questions or concerns. We can certainly see the patient much sooner if necessary.   Disclaimer: This note was dictated with voice recognition software. Similar sounding words can inadvertently be transcribed and may not be corrected upon review.

## 2020-01-17 NOTE — Patient Instructions (Signed)
[facility] Discharge Instructions for Patients Receiving Chemotherapy  Today you received the following chemotherapy agents carboplatin, bevacizumab, pemetrexed.  To help prevent nausea and vomiting after your treatment, we encourage you to take your nausea medication as directed.    If you develop nausea and vomiting that is not controlled by your nausea medication, call the clinic.   BELOW ARE SYMPTOMS THAT SHOULD BE REPORTED IMMEDIATELY:  *FEVER GREATER THAN 100.5 F  *CHILLS WITH OR WITHOUT FEVER  NAUSEA AND VOMITING THAT IS NOT CONTROLLED WITH YOUR NAUSEA MEDICATION  *UNUSUAL SHORTNESS OF BREATH  *UNUSUAL BRUISING OR BLEEDING  TENDERNESS IN MOUTH AND THROAT WITH OR WITHOUT PRESENCE OF ULCERS  *URINARY PROBLEMS  *BOWEL PROBLEMS  UNUSUAL RASH Items with * indicate a potential emergency and should be followed up as soon as possible.  Feel free to call the clinic should you have any questions or concerns. The clinic phone number is [phone].  Please show the [place] at check-in to the Emergency Department and triage nurse.

## 2020-01-17 NOTE — Progress Notes (Signed)
Pt discharged in stable condition.

## 2020-01-24 ENCOUNTER — Inpatient Hospital Stay: Payer: Medicare PPO

## 2020-01-24 ENCOUNTER — Other Ambulatory Visit: Payer: Self-pay

## 2020-01-24 DIAGNOSIS — C450 Mesothelioma of pleura: Secondary | ICD-10-CM | POA: Diagnosis not present

## 2020-01-24 DIAGNOSIS — C457 Mesothelioma of other sites: Secondary | ICD-10-CM

## 2020-01-24 DIAGNOSIS — I10 Essential (primary) hypertension: Secondary | ICD-10-CM | POA: Diagnosis not present

## 2020-01-24 DIAGNOSIS — Z79899 Other long term (current) drug therapy: Secondary | ICD-10-CM | POA: Diagnosis not present

## 2020-01-24 DIAGNOSIS — Z5112 Encounter for antineoplastic immunotherapy: Secondary | ICD-10-CM | POA: Diagnosis not present

## 2020-01-24 DIAGNOSIS — R0602 Shortness of breath: Secondary | ICD-10-CM | POA: Diagnosis not present

## 2020-01-24 DIAGNOSIS — M129 Arthropathy, unspecified: Secondary | ICD-10-CM | POA: Diagnosis not present

## 2020-01-24 DIAGNOSIS — Z5111 Encounter for antineoplastic chemotherapy: Secondary | ICD-10-CM | POA: Diagnosis not present

## 2020-01-24 LAB — CMP (CANCER CENTER ONLY)
ALT: 24 U/L (ref 0–44)
AST: 21 U/L (ref 15–41)
Albumin: 3.6 g/dL (ref 3.5–5.0)
Alkaline Phosphatase: 73 U/L (ref 38–126)
Anion gap: 12 (ref 5–15)
BUN: 18 mg/dL (ref 8–23)
CO2: 26 mmol/L (ref 22–32)
Calcium: 9.1 mg/dL (ref 8.9–10.3)
Chloride: 99 mmol/L (ref 98–111)
Creatinine: 0.85 mg/dL (ref 0.61–1.24)
GFR, Estimated: >60 mL/min (ref >60)
Glucose, Bld: 124 mg/dL — ABNORMAL HIGH (ref 70–99)
Potassium: 4.3 mmol/L (ref 3.5–5.1)
Sodium: 137 mmol/L (ref 135–145)
Total Bilirubin: 0.6 mg/dL (ref 0.3–1.2)
Total Protein: 7 g/dL (ref 6.5–8.1)

## 2020-01-24 LAB — CBC WITH DIFFERENTIAL (CANCER CENTER ONLY)
Abs Immature Granulocytes: 0.01 10*3/uL (ref 0.00–0.07)
Basophils Absolute: 0 10*3/uL (ref 0.0–0.1)
Basophils Relative: 1 %
Eosinophils Absolute: 0.1 10*3/uL (ref 0.0–0.5)
Eosinophils Relative: 2 %
HCT: 38.5 % — ABNORMAL LOW (ref 39.0–52.0)
Hemoglobin: 13.4 g/dL (ref 13.0–17.0)
Immature Granulocytes: 0 %
Lymphocytes Relative: 45 %
Lymphs Abs: 1.5 10*3/uL (ref 0.7–4.0)
MCH: 30.1 pg (ref 26.0–34.0)
MCHC: 34.8 g/dL (ref 30.0–36.0)
MCV: 86.5 fL (ref 80.0–100.0)
Monocytes Absolute: 0.4 10*3/uL (ref 0.1–1.0)
Monocytes Relative: 13 %
Neutro Abs: 1.3 10*3/uL — ABNORMAL LOW (ref 1.7–7.7)
Neutrophils Relative %: 39 %
Platelet Count: 102 10*3/uL — ABNORMAL LOW (ref 150–400)
RBC: 4.45 MIL/uL (ref 4.22–5.81)
RDW: 13.1 % (ref 11.5–15.5)
WBC Count: 3.3 10*3/uL — ABNORMAL LOW (ref 4.0–10.5)
nRBC: 0 % (ref 0.0–0.2)

## 2020-01-24 LAB — TOTAL PROTEIN, URINE DIPSTICK

## 2020-01-31 ENCOUNTER — Inpatient Hospital Stay: Payer: Medicare PPO

## 2020-01-31 ENCOUNTER — Other Ambulatory Visit: Payer: Self-pay

## 2020-01-31 DIAGNOSIS — Z5112 Encounter for antineoplastic immunotherapy: Secondary | ICD-10-CM | POA: Diagnosis not present

## 2020-01-31 DIAGNOSIS — C450 Mesothelioma of pleura: Secondary | ICD-10-CM | POA: Diagnosis not present

## 2020-01-31 DIAGNOSIS — R0602 Shortness of breath: Secondary | ICD-10-CM | POA: Diagnosis not present

## 2020-01-31 DIAGNOSIS — C457 Mesothelioma of other sites: Secondary | ICD-10-CM

## 2020-01-31 DIAGNOSIS — M129 Arthropathy, unspecified: Secondary | ICD-10-CM | POA: Diagnosis not present

## 2020-01-31 DIAGNOSIS — I10 Essential (primary) hypertension: Secondary | ICD-10-CM | POA: Diagnosis not present

## 2020-01-31 DIAGNOSIS — Z79899 Other long term (current) drug therapy: Secondary | ICD-10-CM | POA: Diagnosis not present

## 2020-01-31 DIAGNOSIS — Z95828 Presence of other vascular implants and grafts: Secondary | ICD-10-CM

## 2020-01-31 DIAGNOSIS — Z5111 Encounter for antineoplastic chemotherapy: Secondary | ICD-10-CM | POA: Diagnosis not present

## 2020-01-31 LAB — CBC WITH DIFFERENTIAL (CANCER CENTER ONLY)
Abs Immature Granulocytes: 0.04 10*3/uL (ref 0.00–0.07)
Basophils Absolute: 0 10*3/uL (ref 0.0–0.1)
Basophils Relative: 0 %
Eosinophils Absolute: 0.1 10*3/uL (ref 0.0–0.5)
Eosinophils Relative: 1 %
HCT: 34.5 % — ABNORMAL LOW (ref 39.0–52.0)
Hemoglobin: 11.8 g/dL — ABNORMAL LOW (ref 13.0–17.0)
Immature Granulocytes: 1 %
Lymphocytes Relative: 31 %
Lymphs Abs: 1.5 10*3/uL (ref 0.7–4.0)
MCH: 30.4 pg (ref 26.0–34.0)
MCHC: 34.2 g/dL (ref 30.0–36.0)
MCV: 88.9 fL (ref 80.0–100.0)
Monocytes Absolute: 1 10*3/uL (ref 0.1–1.0)
Monocytes Relative: 20 %
Neutro Abs: 2.3 10*3/uL (ref 1.7–7.7)
Neutrophils Relative %: 47 %
Platelet Count: 86 10*3/uL — ABNORMAL LOW (ref 150–400)
RBC: 3.88 MIL/uL — ABNORMAL LOW (ref 4.22–5.81)
RDW: 13.5 % (ref 11.5–15.5)
WBC Count: 4.9 10*3/uL (ref 4.0–10.5)
nRBC: 0 % (ref 0.0–0.2)

## 2020-01-31 LAB — CMP (CANCER CENTER ONLY)
ALT: 33 U/L (ref 0–44)
AST: 23 U/L (ref 15–41)
Albumin: 3.5 g/dL (ref 3.5–5.0)
Alkaline Phosphatase: 68 U/L (ref 38–126)
Anion gap: 9 (ref 5–15)
BUN: 15 mg/dL (ref 8–23)
CO2: 27 mmol/L (ref 22–32)
Calcium: 9.2 mg/dL (ref 8.9–10.3)
Chloride: 104 mmol/L (ref 98–111)
Creatinine: 0.96 mg/dL (ref 0.61–1.24)
GFR, Estimated: >60 mL/min (ref >60)
Glucose, Bld: 115 mg/dL — ABNORMAL HIGH (ref 70–99)
Potassium: 3.7 mmol/L (ref 3.5–5.1)
Sodium: 140 mmol/L (ref 135–145)
Total Bilirubin: 0.4 mg/dL (ref 0.3–1.2)
Total Protein: 6.5 g/dL (ref 6.5–8.1)

## 2020-01-31 MED ORDER — HEPARIN SOD (PORK) LOCK FLUSH 100 UNIT/ML IV SOLN
500.0000 [IU] | Freq: Once | INTRAVENOUS | Status: AC
  Administered 2020-01-31: 08:00:00 500 [IU]
  Filled 2020-01-31: qty 5

## 2020-01-31 MED ORDER — SODIUM CHLORIDE 0.9% FLUSH
10.0000 mL | Freq: Once | INTRAVENOUS | Status: AC
  Administered 2020-01-31: 08:00:00 10 mL
  Filled 2020-01-31: qty 10

## 2020-02-05 ENCOUNTER — Other Ambulatory Visit: Payer: Self-pay

## 2020-02-05 ENCOUNTER — Ambulatory Visit (HOSPITAL_COMMUNITY)
Admission: RE | Admit: 2020-02-05 | Discharge: 2020-02-05 | Disposition: A | Discharge status: Home / Self Care | Payer: Medicare PPO | Source: Ambulatory Visit | Attending: Internal Medicine | Admitting: Internal Medicine

## 2020-02-05 DIAGNOSIS — M47814 Spondylosis without myelopathy or radiculopathy, thoracic region: Secondary | ICD-10-CM | POA: Diagnosis not present

## 2020-02-05 DIAGNOSIS — C457 Mesothelioma of other sites: Secondary | ICD-10-CM | POA: Insufficient documentation

## 2020-02-05 DIAGNOSIS — K5730 Diverticulosis of large intestine without perforation or abscess without bleeding: Secondary | ICD-10-CM | POA: Diagnosis not present

## 2020-02-05 DIAGNOSIS — I700 Atherosclerosis of aorta: Secondary | ICD-10-CM | POA: Diagnosis not present

## 2020-02-05 DIAGNOSIS — R918 Other nonspecific abnormal finding of lung field: Secondary | ICD-10-CM | POA: Insufficient documentation

## 2020-02-05 DIAGNOSIS — J929 Pleural plaque without asbestos: Secondary | ICD-10-CM | POA: Diagnosis not present

## 2020-02-05 DIAGNOSIS — J984 Other disorders of lung: Secondary | ICD-10-CM | POA: Diagnosis not present

## 2020-02-05 DIAGNOSIS — K449 Diaphragmatic hernia without obstruction or gangrene: Secondary | ICD-10-CM | POA: Diagnosis not present

## 2020-02-05 DIAGNOSIS — C450 Mesothelioma of pleura: Secondary | ICD-10-CM | POA: Diagnosis not present

## 2020-02-05 MED ORDER — IOHEXOL 300 MG/ML  SOLN
100.0000 mL | Freq: Once | INTRAMUSCULAR | Status: AC | PRN
  Administered 2020-02-05: 13:00:00 100 mL via INTRAVENOUS

## 2020-02-07 ENCOUNTER — Inpatient Hospital Stay: Payer: Medicare PPO

## 2020-02-07 ENCOUNTER — Other Ambulatory Visit: Payer: Self-pay

## 2020-02-07 ENCOUNTER — Encounter: Payer: Self-pay | Admitting: Internal Medicine

## 2020-02-07 ENCOUNTER — Inpatient Hospital Stay: Payer: Medicare PPO | Admitting: Internal Medicine

## 2020-02-07 VITALS — BP 154/83 | HR 78 | Temp 98.0°F | Resp 15 | Ht 67.0 in | Wt 200.6 lb

## 2020-02-07 DIAGNOSIS — R0602 Shortness of breath: Secondary | ICD-10-CM | POA: Diagnosis not present

## 2020-02-07 DIAGNOSIS — I10 Essential (primary) hypertension: Secondary | ICD-10-CM | POA: Diagnosis not present

## 2020-02-07 DIAGNOSIS — C457 Mesothelioma of other sites: Secondary | ICD-10-CM

## 2020-02-07 DIAGNOSIS — Z95828 Presence of other vascular implants and grafts: Secondary | ICD-10-CM

## 2020-02-07 DIAGNOSIS — C450 Mesothelioma of pleura: Secondary | ICD-10-CM | POA: Diagnosis not present

## 2020-02-07 DIAGNOSIS — Z5112 Encounter for antineoplastic immunotherapy: Secondary | ICD-10-CM | POA: Diagnosis not present

## 2020-02-07 DIAGNOSIS — Z5111 Encounter for antineoplastic chemotherapy: Secondary | ICD-10-CM

## 2020-02-07 DIAGNOSIS — Z79899 Other long term (current) drug therapy: Secondary | ICD-10-CM | POA: Diagnosis not present

## 2020-02-07 DIAGNOSIS — M129 Arthropathy, unspecified: Secondary | ICD-10-CM | POA: Diagnosis not present

## 2020-02-07 LAB — CBC WITH DIFFERENTIAL (CANCER CENTER ONLY)
Abs Immature Granulocytes: 0.05 10*3/uL (ref 0.00–0.07)
Basophils Absolute: 0 10*3/uL (ref 0.0–0.1)
Basophils Relative: 0 %
Eosinophils Absolute: 0 10*3/uL (ref 0.0–0.5)
Eosinophils Relative: 0 %
HCT: 33.1 % — ABNORMAL LOW (ref 39.0–52.0)
Hemoglobin: 11.8 g/dL — ABNORMAL LOW (ref 13.0–17.0)
Immature Granulocytes: 1 %
Lymphocytes Relative: 16 %
Lymphs Abs: 1.1 10*3/uL (ref 0.7–4.0)
MCH: 30.8 pg (ref 26.0–34.0)
MCHC: 35.6 g/dL (ref 30.0–36.0)
MCV: 86.4 fL (ref 80.0–100.0)
Monocytes Absolute: 0.7 10*3/uL (ref 0.1–1.0)
Monocytes Relative: 11 %
Neutro Abs: 5 10*3/uL (ref 1.7–7.7)
Neutrophils Relative %: 72 %
Platelet Count: 241 10*3/uL (ref 150–400)
RBC: 3.83 MIL/uL — ABNORMAL LOW (ref 4.22–5.81)
RDW: 14.2 % (ref 11.5–15.5)
WBC Count: 6.8 10*3/uL (ref 4.0–10.5)
nRBC: 0 % (ref 0.0–0.2)

## 2020-02-07 LAB — CMP (CANCER CENTER ONLY)
ALT: 29 U/L (ref 0–44)
AST: 18 U/L (ref 15–41)
Albumin: 3.7 g/dL (ref 3.5–5.0)
Alkaline Phosphatase: 67 U/L (ref 38–126)
Anion gap: 12 (ref 5–15)
BUN: 15 mg/dL (ref 8–23)
CO2: 23 mmol/L (ref 22–32)
Calcium: 9.3 mg/dL (ref 8.9–10.3)
Chloride: 101 mmol/L (ref 98–111)
Creatinine: 0.91 mg/dL (ref 0.61–1.24)
GFR, Estimated: >60 mL/min (ref >60)
Glucose, Bld: 241 mg/dL — ABNORMAL HIGH (ref 70–99)
Potassium: 3.9 mmol/L (ref 3.5–5.1)
Sodium: 136 mmol/L (ref 135–145)
Total Bilirubin: 0.4 mg/dL (ref 0.3–1.2)
Total Protein: 6.7 g/dL (ref 6.5–8.1)

## 2020-02-07 MED ORDER — PALONOSETRON HCL INJECTION 0.25 MG/5ML
0.2500 mg | Freq: Once | INTRAVENOUS | Status: AC
  Administered 2020-02-07: 10:00:00 0.25 mg via INTRAVENOUS

## 2020-02-07 MED ORDER — CARBOPLATIN CHEMO INJECTION 600 MG/60ML
481.5000 mg | Freq: Once | INTRAVENOUS | Status: AC
  Administered 2020-02-07: 11:00:00 480 mg via INTRAVENOUS
  Filled 2020-02-07: qty 48

## 2020-02-07 MED ORDER — SODIUM CHLORIDE 0.9 % IV SOLN
150.0000 mg | Freq: Once | INTRAVENOUS | Status: AC
  Administered 2020-02-07: 10:00:00 150 mg via INTRAVENOUS
  Filled 2020-02-07: qty 150

## 2020-02-07 MED ORDER — SODIUM CHLORIDE 0.9 % IV SOLN
Freq: Once | INTRAVENOUS | Status: AC
Start: 2020-02-07 — End: 2020-02-07
  Filled 2020-02-07: qty 250

## 2020-02-07 MED ORDER — HEPARIN SOD (PORK) LOCK FLUSH 100 UNIT/ML IV SOLN
500.0000 [IU] | Freq: Once | INTRAVENOUS | Status: AC | PRN
  Administered 2020-02-07: 12:00:00 500 [IU]
  Filled 2020-02-07: qty 5

## 2020-02-07 MED ORDER — SODIUM CHLORIDE 0.9 % IV SOLN
10.0000 mg | Freq: Once | INTRAVENOUS | Status: AC
  Administered 2020-02-07: 10:00:00 10 mg via INTRAVENOUS
  Filled 2020-02-07: qty 10

## 2020-02-07 MED ORDER — SODIUM CHLORIDE 0.9% FLUSH
10.0000 mL | INTRAVENOUS | Status: DC | PRN
Start: 2020-02-07 — End: 2020-02-07
  Administered 2020-02-07: 12:00:00 10 mL
  Filled 2020-02-07: qty 10

## 2020-02-07 MED ORDER — SODIUM CHLORIDE 0.9% FLUSH
10.0000 mL | Freq: Once | INTRAVENOUS | Status: AC
Start: 2020-02-07 — End: 2020-02-07
  Administered 2020-02-07: 08:00:00 10 mL
  Filled 2020-02-07: qty 10

## 2020-02-07 MED ORDER — SODIUM CHLORIDE 0.9 % IV SOLN
500.0000 mg/m2 | Freq: Once | INTRAVENOUS | Status: AC
  Administered 2020-02-07: 11:00:00 1000 mg via INTRAVENOUS
  Filled 2020-02-07: qty 40

## 2020-02-07 MED ORDER — SODIUM CHLORIDE 0.9 % IV SOLN
15.0000 mg/kg | Freq: Once | INTRAVENOUS | Status: AC
  Administered 2020-02-07: 10:00:00 1300 mg via INTRAVENOUS
  Filled 2020-02-07: qty 48

## 2020-02-07 MED ORDER — PALONOSETRON HCL INJECTION 0.25 MG/5ML
INTRAVENOUS | Status: AC
  Filled 2020-02-07: qty 5

## 2020-02-07 NOTE — Patient Instructions (Signed)
[facility] Discharge Instructions for Patients Receiving Chemotherapy  Today you received the following chemotherapy agents: bevacizumab/pemetrexed/carboplatin.  To help prevent nausea and vomiting after your treatment, we encourage you to take your nausea medication as directed.   If you develop nausea and vomiting that is not controlled by your nausea medication, call the clinic.   BELOW ARE SYMPTOMS THAT SHOULD BE REPORTED IMMEDIATELY:  *FEVER GREATER THAN 100.5 F  *CHILLS WITH OR WITHOUT FEVER  NAUSEA AND VOMITING THAT IS NOT CONTROLLED WITH YOUR NAUSEA MEDICATION  *UNUSUAL SHORTNESS OF BREATH  *UNUSUAL BRUISING OR BLEEDING  TENDERNESS IN MOUTH AND THROAT WITH OR WITHOUT PRESENCE OF ULCERS  *URINARY PROBLEMS  *BOWEL PROBLEMS  UNUSUAL RASH Items with * indicate a potential emergency and should be followed up as soon as possible.  Feel free to call the clinic should you have any questions or concerns. The clinic phone number is [phone].  Please show the [place] at check-in to the Emergency Department and triage nurse.

## 2020-02-07 NOTE — Progress Notes (Signed)
[place] [phone]   [phone]  OFFICE PROGRESS NOTE  [name], MD [place] [address]  DIAGNOSIS: Stage I/II malignant pleural mesothelioma involving the left hemithorax diagnosed in September 2021.  PRIOR THERAPY: None.  CURRENT THERAPY:   Systemic chemotherapy with carboplatin for AUC of 5, Alimta 500 mg/M2 and Avastin 15 mg/KG every 3 weeks.  Status post 3 cycles.  INTERVAL HISTORY: DAVAN NAWABI 79 y.o. male returns to the clinic today for follow-up visit accompanied by his wife.  The patient is feeling fine today with no concerning complaints except for runny nose.  He denied having any current chest pain, shortness of breath, cough or hemoptysis.  He denied having any fever or chills.  He has no nausea, vomiting, diarrhea or constipation.  He has no headache or visual changes.  He has no significant weight loss or night sweats.  He continues to tolerate his treatment with systemic chemotherapy fairly well.  The patient had repeat CT scan of the chest, abdomen pelvis performed recently and he is here for evaluation and discussion of his scan results.  MEDICAL HISTORY: Past Medical History:  Diagnosis Date  . Arthritis   . Cancer (HCC)    hx of prostatae cancer  . Dyspnea    until chest tube placed  . Hypertension   . Mesothelioma [facility])     ALLERGIES:  has No Known Allergies.  MEDICATIONS:  Current Outpatient Medications  Medication Sig Dispense Refill  . acetaminophen (TYLENOL) 500 MG tablet Take 2 tablets (1,000 mg total) by mouth every 6 (six) hours as needed for mild pain or fever. 30 tablet 0  . dexamethasone (DECADRON) 4 MG tablet 1 tablet p.o. twice daily the day before, day of and day after chemotherapy every 3 weeks 40 tablet 1  . fluconazole (DIFLUCAN) 100 MG tablet Take 1 tablet (100 mg total) by mouth daily. 7 tablet 0  . folic acid (FOLVITE) 1 MG tablet Take 1 tablet (1 mg total) by  mouth daily. 30 tablet 4  . lidocaine-prilocaine (EMLA) cream Apply to Port-A-Cath site 30-60-minute before treatment. 30 g 0  . losartan-hydrochlorothiazide (HYZAAR) 100-12.5 MG tablet Take 1 tablet by mouth daily. 90 tablet 3  . Misc Natural Products (NEURIVA PO) Take 1 capsule by mouth daily.  (Patient not taking: Reported on 01/08/2020)    . prochlorperazine (COMPAZINE) 10 MG tablet Take 1 tablet (10 mg total) by mouth every 6 (six) hours as needed for nausea or vomiting. 30 tablet 0  . rosuvastatin (CRESTOR) 10 MG tablet Take 1 tablet (10 mg total) by mouth daily. 90 tablet 3   No current facility-administered medications for this visit.    SURGICAL HISTORY:  Past Surgical History:  Procedure Laterality Date  . APPENDECTOMY    . BACK SURGERY  1998   disectomy  . CHEST TUBE INSERTION Left 11/13/2019   Procedure: INSERTION PLEURAL DRAINAGE CATHETER;  Surgeon: Grace Isaac, MD;  Location: [place];  Service: Thoracic;  Laterality: Left;  . EYE SURGERY Left 1965   injury  . JOINT REPLACEMENT Left 2010   left knee replacement  . KNEE SURGERY    . PLEURAL BIOPSY Left 11/13/2019   Procedure: PLEURAL BIOPSY;  Surgeon: Grace Isaac, MD;  Location: [place];  Service: Thoracic;  Laterality: Left;  . PORTACATH PLACEMENT Left 11/26/2019   Procedure: INSERTION PORT-A-CATH BARD POWERPORT 9.6 FR CATHETER;  Surgeon: Grace Isaac, MD;  [facility];  Service: Thoracic;  Laterality: Left;  . PROSTATE SURGERY    . REMOVAL OF PLEURAL DRAINAGE CATHETER Left 11/26/2019   Procedure: REMOVAL OF PLEURAL DRAINAGE CATHETER;  Surgeon: Grace Isaac, MD;  Location: [place];  Service: Thoracic;  Laterality: Left;  . SPINE SURGERY    . TALC PLEURODESIS Left 11/13/2019   Procedure: possible TALC PLEURADESIS;  Surgeon: Grace Isaac, MD;  Location: [place];  Service: Thoracic;  Laterality: Left;  . TOTAL KNEE ARTHROPLASTY  02/01/2012   Procedure: TOTAL KNEE ARTHROPLASTY;  Surgeon: Sydnee Cabal, MD;  [facility];  Service: Orthopedics;  Laterality: Left;  Marland Kitchen VIDEO ASSISTED THORACOSCOPY Left 11/13/2019   Procedure: VIDEO ASSISTED THORACOSCOPY;  Surgeon: Grace Isaac, MD;  Location: [place];  Service: Thoracic;  Laterality: Left;  Marland Kitchen VIDEO BRONCHOSCOPY N/A 11/13/2019   Procedure: VIDEO BRONCHOSCOPY;  Surgeon: Grace Isaac, MD;  Location: [facility];  Service: Thoracic;  Laterality: N/A;    REVIEW OF SYSTEMS:  Constitutional: negative Eyes: negative Ears, nose, mouth, throat, and face: positive for Runny nose Respiratory: negative Cardiovascular: negative Gastrointestinal: negative Genitourinary:negative Integument/breast: negative Hematologic/lymphatic: negative Musculoskeletal:negative Neurological: negative Behavioral/Psych: negative Endocrine: negative Allergic/Immunologic: negative   PHYSICAL EXAMINATION: General appearance: alert, cooperative and no distress Head: Normocephalic, without obvious abnormality, atraumatic Neck: no adenopathy, no JVD, supple, symmetrical, trachea midline and thyroid not enlarged, symmetric, no tenderness/mass/nodules Lymph nodes: Cervical, supraclavicular, and axillary nodes normal. Resp: clear to auscultation bilaterally Back: symmetric, no curvature. ROM normal. No CVA tenderness. Cardio: regular rate and rhythm, S1, S2 normal, no murmur, click, rub or gallop GI: soft, non-tender; bowel sounds normal; no masses,  no organomegaly Extremities: extremities normal, atraumatic, no cyanosis or edema Neurologic: Alert and oriented X 3, normal strength and tone. Normal symmetric reflexes. Normal coordination and gait  ECOG PERFORMANCE STATUS: 1 - Symptomatic but completely ambulatory  Blood pressure (!) 154/83, pulse 78, temperature 98 F (36.7 C), temperature source Tympanic, resp. rate 15, height 5\' 7"  (1.702 m), weight 200 lb 9.6 oz (91 kg), SpO2 99 %.  LABORATORY DATA: Lab Results  Component Value Date   WBC 4.9 01/31/2020    HGB 11.8 (L) 01/31/2020   HCT 34.5 (L) 01/31/2020   MCV 88.9 01/31/2020   PLT 86 (L) 01/31/2020      Chemistry      Component Value Date/Time   NA 140 01/31/2020 0820   NA 141 09/24/2019 0832   K 3.7 01/31/2020 0820   CL 104 01/31/2020 0820   CO2 27 01/31/2020 0820   BUN 15 01/31/2020 0820   BUN 17 09/24/2019 0832   CREATININE 0.96 01/31/2020 0820   CREATININE 1.01 12/24/2014 0916      Component Value Date/Time   CALCIUM 9.2 01/31/2020 0820   ALKPHOS 68 01/31/2020 0820   AST 23 01/31/2020 0820   ALT 33 01/31/2020 0820   BILITOT 0.4 01/31/2020 0820       RADIOGRAPHIC STUDIES: CT Chest W Contrast  Result Date: 02/05/2020 CLINICAL DATA:  Malignant pleural mesothelioma, history of ongoing chemotherapy in this 79 year old male. EXAM: CT CHEST, ABDOMEN, AND PELVIS WITH CONTRAST TECHNIQUE: Multidetector CT imaging of the chest, abdomen and pelvis was performed following the standard protocol during bolus administration of intravenous contrast. CONTRAST:  155mL OMNIPAQUE IOHEXOL 300 MG/ML  SOLN COMPARISON:  PET scan from November 29, 2019, chest CT from October 30, 2019 FINDINGS: CT CHEST FINDINGS Cardiovascular: Stable appearance of the heart and great vessels. Aortic atherosclerosis as before.  Top-normal heart size. Nodularity of LEFT pericardium and mediastinal border similar to the prior exam. Mediastinum/Nodes: No adenopathy in the chest. LEFT sided Port-A-Cath terminates at the junction of RIGHT and LEFT brachycephalic vessels. Lungs/Pleura: Circumferential pleural thickening in the LEFT chest associated with nodularity is little changed compared to the most recent comparison which is a PET evaluation from November 29, 2019. More nodular area in the medial LEFT chest that extends into the extrapleural fat measures 2.6 x 1.8 cm as compared to 2.6 x 2.2 cm on the comparison PET of November 29, 2019. Mild subpleural reticulation in the RIGHT chest is unchanged. No consolidative changes  or frank pleural nodularity in the RIGHT chest. Musculoskeletal: Spinal degenerative changes in the thoracic spine. No acute musculoskeletal process. No destructive bone finding. CT ABDOMEN PELVIS FINDINGS Hepatobiliary: No focal, suspicious hepatic lesion. No biliary duct dilation. No pericholecystic stranding. Pancreas: Normal without signs of inflammation or ductal dilation. Spleen: Normal size and contour.  No focal lesion in the spleen. Adrenals/Urinary Tract: Adrenal glands are normal. Symmetric renal enhancement. No hydronephrosis. Urinary bladder collapsed, otherwise unremarkable. Stomach/Bowel: Small hiatal hernia. No acute small bowel process. Colon is partially stool filled, mixed with administered oral contrast media. The extensive colonic diverticulosis involving the descending and sigmoid colon with similar appearance to prior imaging. Vascular/Lymphatic: Calcified and noncalcified atheromatous plaque of the abdominal aorta. There is no gastrohepatic or hepatoduodenal ligament lymphadenopathy. No retroperitoneal or mesenteric lymphadenopathy. No pelvic adenopathy post lymphadenectomy. Reproductive: Post prostatectomy and pelvic lymphadenectomy. Other: No ascites. Musculoskeletal: Spinal degenerative changes. No acute or destructive bone process. IMPRESSION: 1. Circumferential pleural thickening similar to prior PET exam with dominant area of nodularity in the inferomedial LEFT chest perhaps slightly smaller than on the prior study. 2. Added density in the LEFT hemithorax may reflect changes of superimposed talc pleurodesis. Correlate with clinical history. 3. No evidence of metastatic disease to the abdomen or pelvis. 4. Colonic diverticulosis without evidence of acute diverticulitis. 5. Post prostatectomy and pelvic lymphadenectomy. 6. Aortic atherosclerosis. Aortic Atherosclerosis (ICD10-I70.0). Electronically Signed   By: Zetta Bills M.D.   On: 02/05/2020 16:14   CT Abdomen Pelvis W  Contrast  Result Date: 02/05/2020 CLINICAL DATA:  Malignant pleural mesothelioma, history of ongoing chemotherapy in this 79 year old male. EXAM: CT CHEST, ABDOMEN, AND PELVIS WITH CONTRAST TECHNIQUE: Multidetector CT imaging of the chest, abdomen and pelvis was performed following the standard protocol during bolus administration of intravenous contrast. CONTRAST:  112mL OMNIPAQUE IOHEXOL 300 MG/ML  SOLN COMPARISON:  PET scan from November 29, 2019, chest CT from October 30, 2019 FINDINGS: CT CHEST FINDINGS Cardiovascular: Stable appearance of the heart and great vessels. Aortic atherosclerosis as before. Top-normal heart size. Nodularity of LEFT pericardium and mediastinal border similar to the prior exam. Mediastinum/Nodes: No adenopathy in the chest. LEFT sided Port-A-Cath terminates at the junction of RIGHT and LEFT brachycephalic vessels. Lungs/Pleura: Circumferential pleural thickening in the LEFT chest associated with nodularity is little changed compared to the most recent comparison which is a PET evaluation from November 29, 2019. More nodular area in the medial LEFT chest that extends into the extrapleural fat measures 2.6 x 1.8 cm as compared to 2.6 x 2.2 cm on the comparison PET of November 29, 2019. Mild subpleural reticulation in the RIGHT chest is unchanged. No consolidative changes or frank pleural nodularity in the RIGHT chest. Musculoskeletal: Spinal degenerative changes in the thoracic spine. No acute musculoskeletal process. No destructive bone finding. CT ABDOMEN PELVIS FINDINGS Hepatobiliary: No focal, suspicious hepatic  lesion. No biliary duct dilation. No pericholecystic stranding. Pancreas: Normal without signs of inflammation or ductal dilation. Spleen: Normal size and contour.  No focal lesion in the spleen. Adrenals/Urinary Tract: Adrenal glands are normal. Symmetric renal enhancement. No hydronephrosis. Urinary bladder collapsed, otherwise unremarkable. Stomach/Bowel: Small hiatal  hernia. No acute small bowel process. Colon is partially stool filled, mixed with administered oral contrast media. The extensive colonic diverticulosis involving the descending and sigmoid colon with similar appearance to prior imaging. Vascular/Lymphatic: Calcified and noncalcified atheromatous plaque of the abdominal aorta. There is no gastrohepatic or hepatoduodenal ligament lymphadenopathy. No retroperitoneal or mesenteric lymphadenopathy. No pelvic adenopathy post lymphadenectomy. Reproductive: Post prostatectomy and pelvic lymphadenectomy. Other: No ascites. Musculoskeletal: Spinal degenerative changes. No acute or destructive bone process. IMPRESSION: 1. Circumferential pleural thickening similar to prior PET exam with dominant area of nodularity in the inferomedial LEFT chest perhaps slightly smaller than on the prior study. 2. Added density in the LEFT hemithorax may reflect changes of superimposed talc pleurodesis. Correlate with clinical history. 3. No evidence of metastatic disease to the abdomen or pelvis. 4. Colonic diverticulosis without evidence of acute diverticulitis. 5. Post prostatectomy and pelvic lymphadenectomy. 6. Aortic atherosclerosis. Aortic Atherosclerosis (ICD10-I70.0). Electronically Signed   By: Zetta Bills M.D.   On: 02/05/2020 16:14    ASSESSMENT AND PLAN: This is a very pleasant 79 years old white male recently diagnosed with malignant pleural mesothelioma involving the left hemithorax. The patient is currently undergoing systemic chemotherapy with carboplatin for AUC of 5, Alimta 500 mg/M2 and Avastin 15 mg/KG every 3 weeks.  Status post 3 cycles. The patient continues to tolerate this treatment well with no concerning adverse effects. He had repeat CT scan of the chest, abdomen pelvis performed recently.  I personally and independently reviewed the scans and discussed the results with the patient and his wife today. His scan showed no concerning findings for disease  progression or metastasis. I recommended for him to continue his current systemic chemotherapy with carboplatin, Alimta and Avastin every 3 weeks and he will proceed with cycle #4 today. The patient will come back for follow-up visit in 3 weeks for evaluation before the next cycle of his treatment. He was advised to call immediately if he has any other concerning symptoms in the interval. The patient voices understanding of current disease status and treatment options and is in agreement with the current care plan.  All questions were answered. The patient knows to call the clinic with any problems, questions or concerns. We can certainly see the patient much sooner if necessary.   Disclaimer: This note was dictated with voice recognition software. Similar sounding words can inadvertently be transcribed and may not be corrected upon review.

## 2020-02-13 ENCOUNTER — Telehealth: Payer: Self-pay | Admitting: *Deleted

## 2020-02-13 NOTE — Telephone Encounter (Signed)
Received call from pt's caregiver. She states Jack Haynes is continuing to have stomach issues with this last treatment. He had some diarrhea which has now resolved with imodium, he had some nausea and vomiting which the compazine did not help much but now is somewhat better. Currently his biggest issue is feeling bloated and his stomach hurts when he eats. Eating makes the bloated feeling worse.  Is only eating/drinking. Advised to try prilosec 1-2 x a day as he is taking decadron for 3 days around his treatment time, and to try gas-x (simethicone) to see if that will help with the bloated feeling. He did have a good, normal BM today.  Caregiver states she will try the above and appreciated the advise.

## 2020-02-14 ENCOUNTER — Inpatient Hospital Stay: Payer: Medicare PPO

## 2020-02-14 ENCOUNTER — Other Ambulatory Visit: Payer: Self-pay

## 2020-02-14 DIAGNOSIS — Z79899 Other long term (current) drug therapy: Secondary | ICD-10-CM | POA: Diagnosis not present

## 2020-02-14 DIAGNOSIS — C457 Mesothelioma of other sites: Secondary | ICD-10-CM

## 2020-02-14 DIAGNOSIS — R0602 Shortness of breath: Secondary | ICD-10-CM | POA: Diagnosis not present

## 2020-02-14 DIAGNOSIS — C450 Mesothelioma of pleura: Secondary | ICD-10-CM | POA: Diagnosis not present

## 2020-02-14 DIAGNOSIS — Z95828 Presence of other vascular implants and grafts: Secondary | ICD-10-CM

## 2020-02-14 DIAGNOSIS — Z5111 Encounter for antineoplastic chemotherapy: Secondary | ICD-10-CM | POA: Diagnosis not present

## 2020-02-14 DIAGNOSIS — M129 Arthropathy, unspecified: Secondary | ICD-10-CM | POA: Diagnosis not present

## 2020-02-14 DIAGNOSIS — I10 Essential (primary) hypertension: Secondary | ICD-10-CM | POA: Diagnosis not present

## 2020-02-14 DIAGNOSIS — Z5112 Encounter for antineoplastic immunotherapy: Secondary | ICD-10-CM | POA: Diagnosis not present

## 2020-02-14 LAB — CMP (CANCER CENTER ONLY)
ALT: 24 U/L (ref 0–44)
AST: 18 U/L (ref 15–41)
Albumin: 3.2 g/dL — ABNORMAL LOW (ref 3.5–5.0)
Alkaline Phosphatase: 60 U/L (ref 38–126)
Anion gap: 8 (ref 5–15)
BUN: 17 mg/dL (ref 8–23)
CO2: 27 mmol/L (ref 22–32)
Calcium: 6.9 mg/dL — ABNORMAL LOW (ref 8.9–10.3)
Chloride: 99 mmol/L (ref 98–111)
Creatinine: 0.81 mg/dL (ref 0.61–1.24)
GFR, Estimated: >60 mL/min (ref >60)
Glucose, Bld: 147 mg/dL — ABNORMAL HIGH (ref 70–99)
Potassium: 3.4 mmol/L — ABNORMAL LOW (ref 3.5–5.1)
Sodium: 134 mmol/L — ABNORMAL LOW (ref 135–145)
Total Bilirubin: 0.9 mg/dL (ref 0.3–1.2)
Total Protein: 6.1 g/dL — ABNORMAL LOW (ref 6.5–8.1)

## 2020-02-14 LAB — CBC WITH DIFFERENTIAL (CANCER CENTER ONLY)
Abs Immature Granulocytes: 0.02 10*3/uL (ref 0.00–0.07)
Basophils Absolute: 0 10*3/uL (ref 0.0–0.1)
Basophils Relative: 0 %
Eosinophils Absolute: 0 10*3/uL (ref 0.0–0.5)
Eosinophils Relative: 1 %
HCT: 35.1 % — ABNORMAL LOW (ref 39.0–52.0)
Hemoglobin: 12.5 g/dL — ABNORMAL LOW (ref 13.0–17.0)
Immature Granulocytes: 1 %
Lymphocytes Relative: 47 %
Lymphs Abs: 1.1 10*3/uL (ref 0.7–4.0)
MCH: 30.6 pg (ref 26.0–34.0)
MCHC: 35.6 g/dL (ref 30.0–36.0)
MCV: 85.8 fL (ref 80.0–100.0)
Monocytes Absolute: 0.2 10*3/uL (ref 0.1–1.0)
Monocytes Relative: 10 %
Neutro Abs: 1 10*3/uL — ABNORMAL LOW (ref 1.7–7.7)
Neutrophils Relative %: 41 %
Platelet Count: 91 10*3/uL — ABNORMAL LOW (ref 150–400)
RBC: 4.09 MIL/uL — ABNORMAL LOW (ref 4.22–5.81)
RDW: 14.3 % (ref 11.5–15.5)
WBC Count: 2.4 10*3/uL — ABNORMAL LOW (ref 4.0–10.5)
nRBC: 0 % (ref 0.0–0.2)

## 2020-02-14 LAB — TOTAL PROTEIN, URINE DIPSTICK: Protein, ur: 100 mg/dL — AB

## 2020-02-14 MED ORDER — SODIUM CHLORIDE 0.9% FLUSH
10.0000 mL | Freq: Once | INTRAVENOUS | Status: AC
  Administered 2020-02-14: 08:00:00 10 mL
  Filled 2020-02-14: qty 10

## 2020-02-14 MED ORDER — HEPARIN SOD (PORK) LOCK FLUSH 100 UNIT/ML IV SOLN
500.0000 [IU] | Freq: Once | INTRAVENOUS | Status: AC
  Administered 2020-02-14: 08:00:00 500 [IU]
  Filled 2020-02-14: qty 5

## 2020-02-18 ENCOUNTER — Other Ambulatory Visit: Payer: Self-pay | Admitting: Internal Medicine

## 2020-02-21 ENCOUNTER — Other Ambulatory Visit: Payer: Self-pay

## 2020-02-21 ENCOUNTER — Inpatient Hospital Stay: Payer: PPO | Attending: Internal Medicine

## 2020-02-21 ENCOUNTER — Inpatient Hospital Stay: Payer: PPO

## 2020-02-21 DIAGNOSIS — Z79899 Other long term (current) drug therapy: Secondary | ICD-10-CM | POA: Diagnosis not present

## 2020-02-21 DIAGNOSIS — Z5111 Encounter for antineoplastic chemotherapy: Secondary | ICD-10-CM | POA: Diagnosis not present

## 2020-02-21 DIAGNOSIS — K5730 Diverticulosis of large intestine without perforation or abscess without bleeding: Secondary | ICD-10-CM | POA: Insufficient documentation

## 2020-02-21 DIAGNOSIS — C457 Mesothelioma of other sites: Secondary | ICD-10-CM

## 2020-02-21 DIAGNOSIS — C450 Mesothelioma of pleura: Secondary | ICD-10-CM | POA: Insufficient documentation

## 2020-02-21 DIAGNOSIS — K449 Diaphragmatic hernia without obstruction or gangrene: Secondary | ICD-10-CM | POA: Insufficient documentation

## 2020-02-21 DIAGNOSIS — I700 Atherosclerosis of aorta: Secondary | ICD-10-CM | POA: Diagnosis not present

## 2020-02-21 DIAGNOSIS — Z8546 Personal history of malignant neoplasm of prostate: Secondary | ICD-10-CM | POA: Diagnosis not present

## 2020-02-21 DIAGNOSIS — R112 Nausea with vomiting, unspecified: Secondary | ICD-10-CM | POA: Insufficient documentation

## 2020-02-21 DIAGNOSIS — I10 Essential (primary) hypertension: Secondary | ICD-10-CM | POA: Insufficient documentation

## 2020-02-21 DIAGNOSIS — K5900 Constipation, unspecified: Secondary | ICD-10-CM | POA: Insufficient documentation

## 2020-02-21 DIAGNOSIS — Z95828 Presence of other vascular implants and grafts: Secondary | ICD-10-CM

## 2020-02-21 LAB — CBC WITH DIFFERENTIAL (CANCER CENTER ONLY)
Abs Immature Granulocytes: 0.02 10*3/uL (ref 0.00–0.07)
Basophils Absolute: 0 10*3/uL (ref 0.0–0.1)
Basophils Relative: 0 %
Eosinophils Absolute: 0 10*3/uL (ref 0.0–0.5)
Eosinophils Relative: 0 %
HCT: 34.9 % — ABNORMAL LOW (ref 39.0–52.0)
Hemoglobin: 12.2 g/dL — ABNORMAL LOW (ref 13.0–17.0)
Immature Granulocytes: 0 %
Lymphocytes Relative: 31 %
Lymphs Abs: 1.4 10*3/uL (ref 0.7–4.0)
MCH: 30.7 pg (ref 26.0–34.0)
MCHC: 35 g/dL (ref 30.0–36.0)
MCV: 87.7 fL (ref 80.0–100.0)
Monocytes Absolute: 0.7 10*3/uL (ref 0.1–1.0)
Monocytes Relative: 16 %
Neutro Abs: 2.4 10*3/uL (ref 1.7–7.7)
Neutrophils Relative %: 53 %
Platelet Count: 116 10*3/uL — ABNORMAL LOW (ref 150–400)
RBC: 3.98 MIL/uL — ABNORMAL LOW (ref 4.22–5.81)
RDW: 14.8 % (ref 11.5–15.5)
WBC Count: 4.6 10*3/uL (ref 4.0–10.5)
nRBC: 0 % (ref 0.0–0.2)

## 2020-02-21 LAB — CMP (CANCER CENTER ONLY)
ALT: 36 U/L (ref 0–44)
AST: 20 U/L (ref 15–41)
Albumin: 3.5 g/dL (ref 3.5–5.0)
Alkaline Phosphatase: 82 U/L (ref 38–126)
Anion gap: 11 (ref 5–15)
BUN: 12 mg/dL (ref 8–23)
CO2: 25 mmol/L (ref 22–32)
Calcium: 9.2 mg/dL (ref 8.9–10.3)
Chloride: 101 mmol/L (ref 98–111)
Creatinine: 1.01 mg/dL (ref 0.61–1.24)
GFR, Estimated: >60 mL/min (ref >60)
Glucose, Bld: 183 mg/dL — ABNORMAL HIGH (ref 70–99)
Potassium: 3.3 mmol/L — ABNORMAL LOW (ref 3.5–5.1)
Sodium: 137 mmol/L (ref 135–145)
Total Bilirubin: 0.5 mg/dL (ref 0.3–1.2)
Total Protein: 6.8 g/dL (ref 6.5–8.1)

## 2020-02-21 MED ORDER — HEPARIN SOD (PORK) LOCK FLUSH 100 UNIT/ML IV SOLN
500.0000 [IU] | Freq: Once | INTRAVENOUS | Status: AC
  Administered 2020-02-21: 500 [IU]
  Filled 2020-02-21: qty 5

## 2020-02-21 MED ORDER — SODIUM CHLORIDE 0.9% FLUSH
10.0000 mL | Freq: Once | INTRAVENOUS | Status: AC
  Administered 2020-02-21: 10 mL
  Filled 2020-02-21: qty 10

## 2020-02-21 NOTE — Progress Notes (Signed)
Blood return noted from port but not enough for lab draw. Labs drawn peripheraly.

## 2020-02-25 NOTE — Progress Notes (Signed)
[facility] OFFICE PROGRESS NOTE  [name], MD [place] [address]  DIAGNOSIS: Stage I/IImalignant pleural mesothelioma involving the left hemithorax diagnosed in September 2021.  PRIOR THERAPY: None  CURRENT THERAPY: Systemic chemotherapy with carboplatin for AUC of 5, Alimta 500 mg/M2 and Avastin 15 mg/KG every 3 weeks.  Status post 4 cycles.  INTERVAL HISTORY: JUQUAN REZNICK 80 y.o. male returns to clinic today for a follow-up visit accompanied by his wife.  The patient is feeling well today without any concerning complaints. He had slightly more side effects for the first 3-4 days following his most recent infusion. Otherwise, he tends to feel good for the two weeks off treatment. He had more nausea and 1 episode of vomiting. They are interested in getting a prescription for sublingual zofran.  He also alternated between diarrhea and constipation which also subsided.  He also started taking Prilosec and Gas-X for bloating which helped his symptoms. The patient denies any fever, chills, or night sweats. The patient reports dyspnea on exertion but denies any chest pain, cough, or hemoptysis. The patient denies any abnormal bleeding or bruising. The patient did not take his losartan for his BP today yet but has it with him. They are also inquiring about the 4th COVID-19 vaccine. He notes he sometimes feels light headed in the morning that subsides. Denies gait changes or visual changes. He took his decadron pre-meds as prescribed. The patient denies being diagnosed with diabetes. Per chart review, he had a hemoglobin A1C performed in August 2021 which was 6.6. He had toast and jelly this morning for breakfast.  The patient is here today for evaluation before starting cycle #5.  MEDICAL HISTORY: Past Medical History:  Diagnosis Date   Arthritis    Cancer ([place])    hx of prostatae cancer   Dyspnea    until chest tube placed   Hypertension     [facility])     ALLERGIES:  has No Known Allergies.  MEDICATIONS:  Current Outpatient Medications  Medication Sig Dispense Refill   acetaminophen (TYLENOL) 500 MG tablet Take 2 tablets (1,000 mg total) by mouth every 6 (six) hours as needed for mild pain or fever. 30 tablet 0   dexamethasone (DECADRON) 4 MG tablet 1 tablet p.o. twice daily the day before, day of and day after chemotherapy every 3 weeks 40 tablet 1   fluconazole (DIFLUCAN) 100 MG tablet Take 1 tablet (100 mg total) by mouth daily. 7 tablet 0   folic acid (FOLVITE) 1 MG tablet TAKE 1 TABLET BY MOUTH EVERY DAY 90 tablet 1   lidocaine-prilocaine (EMLA) cream Apply to Port-A-Cath site 30-60-minute before treatment. 30 g 0   losartan-hydrochlorothiazide (HYZAAR) 100-12.5 MG tablet Take 1 tablet by mouth daily. 90 tablet 3   Misc Natural Products (NEURIVA PO) Take 1 capsule by mouth daily.  (Patient not taking: Reported on 01/08/2020)     prochlorperazine (COMPAZINE) 10 MG tablet Take 1 tablet (10 mg total) by mouth every 6 (six) hours as needed for nausea or vomiting. 30 tablet 0   rosuvastatin (CRESTOR) 10 MG tablet Take 1 tablet (10 mg total) by mouth daily. 90 tablet 3   No current facility-administered medications for this visit.    SURGICAL HISTORY:  Past Surgical History:  Procedure Laterality Date   APPENDECTOMY     BACK SURGERY  1998   disectomy   CHEST TUBE INSERTION Left 11/13/2019   Procedure: INSERTION PLEURAL DRAINAGE CATHETER;  Surgeon:  Grace Isaac, MD;  Location: [facility];  Service: Thoracic;  Laterality: Left;   EYE SURGERY Left 1965   injury   JOINT REPLACEMENT Left 2010   left knee replacement   KNEE SURGERY     PLEURAL BIOPSY Left 11/13/2019   Procedure: PLEURAL BIOPSY;  Surgeon: Grace Isaac, MD;  Location: [facility];  Service: Thoracic;  Laterality: Left;   PORTACATH PLACEMENT Left 11/26/2019   Procedure: INSERTION PORT-A-CATH BARD POWERPORT 9.6 FR CATHETER;  Surgeon:  Grace Isaac, MD;  Location: [facility];  Service: Thoracic;  Laterality: Left;   PROSTATE SURGERY     REMOVAL OF PLEURAL DRAINAGE CATHETER Left 11/26/2019   Procedure: REMOVAL OF PLEURAL DRAINAGE CATHETER;  Surgeon: Grace Isaac, MD;  Location: [facility];  Service: Thoracic;  Laterality: Left;   SPINE SURGERY     TALC PLEURODESIS Left 11/13/2019   Procedure: possible TALC PLEURADESIS;  Surgeon: Grace Isaac, MD;  Location: [facility];  Service: Thoracic;  Laterality: Left;   TOTAL KNEE ARTHROPLASTY  02/01/2012   Procedure: TOTAL KNEE ARTHROPLASTY;  Surgeon: Sydnee Cabal, MD;  Location: [facility];  Service: Orthopedics;  Laterality: Left;   VIDEO ASSISTED THORACOSCOPY Left 11/13/2019   Procedure: VIDEO ASSISTED THORACOSCOPY;  Surgeon: Grace Isaac, MD;  Location: [facility];  Service: Thoracic;  Laterality: Left;   VIDEO BRONCHOSCOPY N/A 11/13/2019   Procedure: VIDEO BRONCHOSCOPY;  Surgeon: Grace Isaac, MD;  Location: [facility];  Service: Thoracic;  Laterality: N/A;    REVIEW OF SYSTEMS:   Review of Systems  Constitutional: Positive for weight loss. Negative for appetite change, chills, fatigue, or fever.  HENT:   Negative for mouth sores, nosebleeds, sore throat and trouble swallowing.   Eyes: Negative for eye problems and icterus.  Respiratory: Positive for shortness of breath on exertion. Negative for cough, hemoptysis, and wheezing.   Cardiovascular: Negative for chest pain and leg swelling.  Gastrointestinal: Negative for abdominal pain, constipation (resolved), diarrhea (resolved), nausea (resolved) and vomiting (resolved).  Genitourinary: Negative for bladder incontinence, difficulty urinating, dysuria, frequency and hematuria.   Musculoskeletal: Negative for back pain, gait problem, neck pain and neck stiffness.  Skin: Negative for itching and rash.  Neurological: Positive for occasional light headedness in AM. Negative for dizziness, extremity weakness, gait problem,  headaches, and seizures.  Hematological: Negative for adenopathy. Does not bruise/bleed easily.  Psychiatric/Behavioral: Negative for confusion, depression and sleep disturbance. The patient is not nervous/anxious.     PHYSICAL EXAMINATION:  There were no vitals taken for this visit.  ECOG PERFORMANCE STATUS: 1 - Symptomatic but completely ambulatory  Physical Exam  Constitutional: Oriented to person, place, and time and well-developed, well-nourished, and in no distress.  HENT:  Head: Normocephalic and atraumatic.  Mouth/Throat: Oropharynx is clear and moist. No oropharyngeal exudate.  Eyes: Conjunctivae are normal. Right eye exhibits no discharge. Left eye exhibits no discharge. No scleral icterus.  Neck: Normal range of motion. Neck supple.  Cardiovascular: Normal rate, regular rhythm, normal heart sounds and intact distal pulses.   Pulmonary/Chest: Effort normal and breath sounds normal. No respiratory distress. No wheezes. No rales.  Abdominal: Soft. Bowel sounds are normal. Exhibits no distension and no mass. There is no tenderness.  Musculoskeletal: Normal range of motion. Exhibits no edema.  Lymphadenopathy:    No cervical adenopathy.  Neurological: Alert and oriented to person, place, and time. Exhibits normal muscle tone. Gait normal. Coordination normal.  Skin: Skin is warm and dry. No rash noted. Not diaphoretic.  No erythema. No pallor.  Psychiatric: Mood, memory and judgment normal.  Vitals reviewed.  LABORATORY DATA: Lab Results  Component Value Date   WBC 4.6 02/21/2020   HGB 12.2 (L) 02/21/2020   HCT 34.9 (L) 02/21/2020   MCV 87.7 02/21/2020   PLT 116 (L) 02/21/2020      Chemistry      Component Value Date/Time   NA 137 02/21/2020 0823   NA 141 09/24/2019 0832   K 3.3 (L) 02/21/2020 0823   CL 101 02/21/2020 0823   CO2 25 02/21/2020 0823   BUN 12 02/21/2020 0823   BUN 17 09/24/2019 0832   CREATININE 1.01 02/21/2020 0823   CREATININE 1.01 12/24/2014 0916       Component Value Date/Time   CALCIUM 9.2 02/21/2020 0823   ALKPHOS 82 02/21/2020 0823   AST 20 02/21/2020 0823   ALT 36 02/21/2020 0823   BILITOT 0.5 02/21/2020 0823       RADIOGRAPHIC STUDIES:  CT Chest W Contrast  Result Date: 02/05/2020 CLINICAL DATA:  Malignant pleural mesothelioma, history of ongoing chemotherapy in this 80 year old male. EXAM: CT CHEST, ABDOMEN, AND PELVIS WITH CONTRAST TECHNIQUE: Multidetector CT imaging of the chest, abdomen and pelvis was performed following the standard protocol during bolus administration of intravenous contrast. CONTRAST:  169mL OMNIPAQUE IOHEXOL 300 MG/ML  SOLN COMPARISON:  PET scan from November 29, 2019, chest CT from October 30, 2019 FINDINGS: CT CHEST FINDINGS Cardiovascular: Stable appearance of the heart and great vessels. Aortic atherosclerosis as before. Top-normal heart size. Nodularity of LEFT pericardium and mediastinal border similar to the prior exam. Mediastinum/Nodes: No adenopathy in the chest. LEFT sided Port-A-Cath terminates at the junction of RIGHT and LEFT brachycephalic vessels. Lungs/Pleura: Circumferential pleural thickening in the LEFT chest associated with nodularity is little changed compared to the most recent comparison which is a PET evaluation from November 29, 2019. More nodular area in the medial LEFT chest that extends into the extrapleural fat measures 2.6 x 1.8 cm as compared to 2.6 x 2.2 cm on the comparison PET of November 29, 2019. Mild subpleural reticulation in the RIGHT chest is unchanged. No consolidative changes or frank pleural nodularity in the RIGHT chest. Musculoskeletal: Spinal degenerative changes in the thoracic spine. No acute musculoskeletal process. No destructive bone finding. CT ABDOMEN PELVIS FINDINGS Hepatobiliary: No focal, suspicious hepatic lesion. No biliary duct dilation. No pericholecystic stranding. Pancreas: Normal without signs of inflammation or ductal dilation. Spleen: Normal size  and contour.  No focal lesion in the spleen. Adrenals/Urinary Tract: Adrenal glands are normal. Symmetric renal enhancement. No hydronephrosis. Urinary bladder collapsed, otherwise unremarkable. Stomach/Bowel: Small hiatal hernia. No acute small bowel process. Colon is partially stool filled, mixed with administered oral contrast media. The extensive colonic diverticulosis involving the descending and sigmoid colon with similar appearance to prior imaging. Vascular/Lymphatic: Calcified and noncalcified atheromatous plaque of the abdominal aorta. There is no gastrohepatic or hepatoduodenal ligament lymphadenopathy. No retroperitoneal or mesenteric lymphadenopathy. No pelvic adenopathy post lymphadenectomy. Reproductive: Post prostatectomy and pelvic lymphadenectomy. Other: No ascites. Musculoskeletal: Spinal degenerative changes. No acute or destructive bone process. IMPRESSION: 1. Circumferential pleural thickening similar to prior PET exam with dominant area of nodularity in the inferomedial LEFT chest perhaps slightly smaller than on the prior study. 2. Added density in the LEFT hemithorax may reflect changes of superimposed talc pleurodesis. Correlate with clinical history. 3. No evidence of metastatic disease to the abdomen or pelvis. 4. Colonic diverticulosis without evidence of acute diverticulitis. 5. Post prostatectomy and  pelvic lymphadenectomy. 6. Aortic atherosclerosis. Aortic Atherosclerosis (ICD10-I70.0). Electronically Signed   By: Zetta Bills M.D.   On: 02/05/2020 16:14   CT Abdomen Pelvis W Contrast  Result Date: 02/05/2020 CLINICAL DATA:  Malignant pleural mesothelioma, history of ongoing chemotherapy in this 79 year old male. EXAM: CT CHEST, ABDOMEN, AND PELVIS WITH CONTRAST TECHNIQUE: Multidetector CT imaging of the chest, abdomen and pelvis was performed following the standard protocol during bolus administration of intravenous contrast. CONTRAST:  121mL OMNIPAQUE IOHEXOL 300 MG/ML  SOLN  COMPARISON:  PET scan from November 29, 2019, chest CT from October 30, 2019 FINDINGS: CT CHEST FINDINGS Cardiovascular: Stable appearance of the heart and great vessels. Aortic atherosclerosis as before. Top-normal heart size. Nodularity of LEFT pericardium and mediastinal border similar to the prior exam. Mediastinum/Nodes: No adenopathy in the chest. LEFT sided Port-A-Cath terminates at the junction of RIGHT and LEFT brachycephalic vessels. Lungs/Pleura: Circumferential pleural thickening in the LEFT chest associated with nodularity is little changed compared to the most recent comparison which is a PET evaluation from November 29, 2019. More nodular area in the medial LEFT chest that extends into the extrapleural fat measures 2.6 x 1.8 cm as compared to 2.6 x 2.2 cm on the comparison PET of November 29, 2019. Mild subpleural reticulation in the RIGHT chest is unchanged. No consolidative changes or frank pleural nodularity in the RIGHT chest. Musculoskeletal: Spinal degenerative changes in the thoracic spine. No acute musculoskeletal process. No destructive bone finding. CT ABDOMEN PELVIS FINDINGS Hepatobiliary: No focal, suspicious hepatic lesion. No biliary duct dilation. No pericholecystic stranding. Pancreas: Normal without signs of inflammation or ductal dilation. Spleen: Normal size and contour.  No focal lesion in the spleen. Adrenals/Urinary Tract: Adrenal glands are normal. Symmetric renal enhancement. No hydronephrosis. Urinary bladder collapsed, otherwise unremarkable. Stomach/Bowel: Small hiatal hernia. No acute small bowel process. Colon is partially stool filled, mixed with administered oral contrast media. The extensive colonic diverticulosis involving the descending and sigmoid colon with similar appearance to prior imaging. Vascular/Lymphatic: Calcified and noncalcified atheromatous plaque of the abdominal aorta. There is no gastrohepatic or hepatoduodenal ligament lymphadenopathy. No  retroperitoneal or mesenteric lymphadenopathy. No pelvic adenopathy post lymphadenectomy. Reproductive: Post prostatectomy and pelvic lymphadenectomy. Other: No ascites. Musculoskeletal: Spinal degenerative changes. No acute or destructive bone process. IMPRESSION: 1. Circumferential pleural thickening similar to prior PET exam with dominant area of nodularity in the inferomedial LEFT chest perhaps slightly smaller than on the prior study. 2. Added density in the LEFT hemithorax may reflect changes of superimposed talc pleurodesis. Correlate with clinical history. 3. No evidence of metastatic disease to the abdomen or pelvis. 4. Colonic diverticulosis without evidence of acute diverticulitis. 5. Post prostatectomy and pelvic lymphadenectomy. 6. Aortic atherosclerosis. Aortic Atherosclerosis (ICD10-I70.0). Electronically Signed   By: Zetta Bills M.D.   On: 02/05/2020 16:14     ASSESSMENT/PLAN:  This is a very pleasant 80 year old Caucasian male diagnosed with malignant mesothelioma involving the left hemithorax.  The patient was diagnosed in September 2021.  The patient is currently undergoing systemic chemotherapy with carboplatin for an AUC of 5, Alimta 500 mg per metered squared, and Avastin 15 mg/kg IV every 3 weeks.  He is status post 4 cycles and he tolerated it well without any concerning adverse side effects except for fatigue and nausea for the first 3-4 days following treatment.   Labs were reviewed.  Recommend that he proceed with cycle #5 today as schedule. Discussed starting from cycle #7, he will start maintenance Alimta and Avastin.   We  will see him back for follow-up visit in 3 weeks for evaluation before starting cycle #6.  His BP was elevated today. He did not take his losartan yet. I instructed him to start taking his losartan before his appointment. He will take his dose today.   I will send in a prescription for sublingual zofran for nausea. However, discussed that this should  be used as needed for nausea starting after day 3 of chemotherapy.   His blood sugar is elevated today. The patient denies being formally diagnosed with diabetes. Of note, the patient took decadron for his pre-meds. I reviewed with Dr. Julien Nordmann. The patient was instructed to follow up with his PCP for evaluation to see if he has diabetes. Per chart review, his A1c from August 2021 was 6.6.    The patient was advised to call immediately if he has any concerning symptoms in the interval. The patient voices understanding of current disease status and treatment options and is in agreement with the current care plan. All questions were answered. The patient knows to call the clinic with any problems, questions or concerns. We can certainly see the patient much sooner if necessary   No orders of the defined types were placed in this encounter.    I spent 20-29 minutes for this encounter.   Eh Sauseda L Edom Schmuhl, PA-C 02/25/20

## 2020-02-28 ENCOUNTER — Inpatient Hospital Stay: Payer: PPO

## 2020-02-28 ENCOUNTER — Encounter: Payer: Self-pay | Admitting: Physician Assistant

## 2020-02-28 ENCOUNTER — Telehealth: Payer: Self-pay | Admitting: Internal Medicine

## 2020-02-28 ENCOUNTER — Other Ambulatory Visit: Payer: Self-pay

## 2020-02-28 ENCOUNTER — Inpatient Hospital Stay (HOSPITAL_BASED_OUTPATIENT_CLINIC_OR_DEPARTMENT_OTHER): Payer: PPO | Admitting: Physician Assistant

## 2020-02-28 VITALS — BP 164/83 | HR 93 | Temp 97.1°F | Resp 16 | Ht 67.0 in | Wt 191.4 lb

## 2020-02-28 DIAGNOSIS — Z5111 Encounter for antineoplastic chemotherapy: Secondary | ICD-10-CM

## 2020-02-28 DIAGNOSIS — Z95828 Presence of other vascular implants and grafts: Secondary | ICD-10-CM

## 2020-02-28 DIAGNOSIS — E1159 Type 2 diabetes mellitus with other circulatory complications: Secondary | ICD-10-CM | POA: Insufficient documentation

## 2020-02-28 DIAGNOSIS — C457 Mesothelioma of other sites: Secondary | ICD-10-CM

## 2020-02-28 DIAGNOSIS — I159 Secondary hypertension, unspecified: Secondary | ICD-10-CM | POA: Diagnosis not present

## 2020-02-28 DIAGNOSIS — I10 Essential (primary) hypertension: Secondary | ICD-10-CM | POA: Insufficient documentation

## 2020-02-28 LAB — CBC WITH DIFFERENTIAL (CANCER CENTER ONLY)
Abs Immature Granulocytes: 0.03 10*3/uL (ref 0.00–0.07)
Basophils Absolute: 0 10*3/uL (ref 0.0–0.1)
Basophils Relative: 0 %
Eosinophils Absolute: 0 10*3/uL (ref 0.0–0.5)
Eosinophils Relative: 0 %
HCT: 33.6 % — ABNORMAL LOW (ref 39.0–52.0)
Hemoglobin: 12.1 g/dL — ABNORMAL LOW (ref 13.0–17.0)
Immature Granulocytes: 1 %
Lymphocytes Relative: 10 %
Lymphs Abs: 0.6 10*3/uL — ABNORMAL LOW (ref 0.7–4.0)
MCH: 31.8 pg (ref 26.0–34.0)
MCHC: 36 g/dL (ref 30.0–36.0)
MCV: 88.4 fL (ref 80.0–100.0)
Monocytes Absolute: 0.3 10*3/uL (ref 0.1–1.0)
Monocytes Relative: 6 %
Neutro Abs: 4.9 10*3/uL (ref 1.7–7.7)
Neutrophils Relative %: 83 %
Platelet Count: 279 10*3/uL (ref 150–400)
RBC: 3.8 MIL/uL — ABNORMAL LOW (ref 4.22–5.81)
RDW: 15.3 % (ref 11.5–15.5)
WBC Count: 5.8 10*3/uL (ref 4.0–10.5)
nRBC: 0 % (ref 0.0–0.2)

## 2020-02-28 LAB — CMP (CANCER CENTER ONLY)
ALT: 25 U/L (ref 0–44)
AST: 19 U/L (ref 15–41)
Albumin: 3.8 g/dL (ref 3.5–5.0)
Alkaline Phosphatase: 64 U/L (ref 38–126)
Anion gap: 10 (ref 5–15)
BUN: 21 mg/dL (ref 8–23)
CO2: 23 mmol/L (ref 22–32)
Calcium: 9.5 mg/dL (ref 8.9–10.3)
Chloride: 104 mmol/L (ref 98–111)
Creatinine: 1.05 mg/dL (ref 0.61–1.24)
GFR, Estimated: >60 mL/min (ref >60)
Glucose, Bld: 311 mg/dL — ABNORMAL HIGH (ref 70–99)
Potassium: 4 mmol/L (ref 3.5–5.1)
Sodium: 137 mmol/L (ref 135–145)
Total Bilirubin: 0.4 mg/dL (ref 0.3–1.2)
Total Protein: 7.2 g/dL (ref 6.5–8.1)

## 2020-02-28 LAB — TOTAL PROTEIN, URINE DIPSTICK

## 2020-02-28 MED ORDER — SODIUM CHLORIDE 0.9% FLUSH
10.0000 mL | INTRAVENOUS | Status: DC | PRN
  Administered 2020-02-28: 10 mL
  Filled 2020-02-28: qty 10

## 2020-02-28 MED ORDER — SODIUM CHLORIDE 0.9 % IV SOLN
150.0000 mg | Freq: Once | INTRAVENOUS | Status: AC
  Administered 2020-02-28: 150 mg via INTRAVENOUS
  Filled 2020-02-28: qty 150

## 2020-02-28 MED ORDER — ONDANSETRON 4 MG PO TBDP: 4.0000 mg | ORAL_TABLET | Freq: Three times a day (TID) | ORAL | 1 refills | Status: DC | PRN

## 2020-02-28 MED ORDER — SODIUM CHLORIDE 0.9 % IV SOLN
500.0000 mg/m2 | Freq: Once | INTRAVENOUS | Status: AC
  Administered 2020-02-28: 1000 mg via INTRAVENOUS
  Filled 2020-02-28: qty 40

## 2020-02-28 MED ORDER — SODIUM CHLORIDE 0.9 % IV SOLN
15.0000 mg/kg | Freq: Once | INTRAVENOUS | Status: AC
  Administered 2020-02-28: 1300 mg via INTRAVENOUS
  Filled 2020-02-28: qty 48

## 2020-02-28 MED ORDER — SODIUM CHLORIDE 0.9 % IV SOLN
10.0000 mg | Freq: Once | INTRAVENOUS | Status: AC
  Administered 2020-02-28: 10 mg via INTRAVENOUS
  Filled 2020-02-28: qty 10

## 2020-02-28 MED ORDER — HEPARIN SOD (PORK) LOCK FLUSH 100 UNIT/ML IV SOLN
500.0000 [IU] | Freq: Once | INTRAVENOUS | Status: AC | PRN
  Administered 2020-02-28: 500 [IU]
  Filled 2020-02-28: qty 5

## 2020-02-28 MED ORDER — SODIUM CHLORIDE 0.9 % IV SOLN
Freq: Once | INTRAVENOUS | Status: AC
  Filled 2020-02-28: qty 250

## 2020-02-28 MED ORDER — SODIUM CHLORIDE 0.9 % IV SOLN
464.5000 mg | Freq: Once | INTRAVENOUS | Status: AC
  Administered 2020-02-28: 460 mg via INTRAVENOUS
  Filled 2020-02-28: qty 46

## 2020-02-28 MED ORDER — PALONOSETRON HCL INJECTION 0.25 MG/5ML
0.2500 mg | Freq: Once | INTRAVENOUS | Status: AC
  Administered 2020-02-28: 0.25 mg via INTRAVENOUS

## 2020-02-28 MED ORDER — SODIUM CHLORIDE 0.9% FLUSH
10.0000 mL | Freq: Once | INTRAVENOUS | Status: AC
Start: 2020-02-28 — End: 2020-02-28
  Administered 2020-02-28: 10 mL
  Filled 2020-02-28: qty 10

## 2020-02-28 MED ORDER — PALONOSETRON HCL INJECTION 0.25 MG/5ML
INTRAVENOUS | Status: AC
  Filled 2020-02-28: qty 5

## 2020-02-28 NOTE — Patient Instructions (Signed)
[facility] Discharge Instructions for Patients Receiving Chemotherapy  Today you received the following chemotherapy agents: bevacizumab/pemetrexed/carboplatin.  To help prevent nausea and vomiting after your treatment, we encourage you to take your nausea medication as directed.   If you develop nausea and vomiting that is not controlled by your nausea medication, call the clinic.   BELOW ARE SYMPTOMS THAT SHOULD BE REPORTED IMMEDIATELY:  *FEVER GREATER THAN 100.5 F  *CHILLS WITH OR WITHOUT FEVER  NAUSEA AND VOMITING THAT IS NOT CONTROLLED WITH YOUR NAUSEA MEDICATION  *UNUSUAL SHORTNESS OF BREATH  *UNUSUAL BRUISING OR BLEEDING  TENDERNESS IN MOUTH AND THROAT WITH OR WITHOUT PRESENCE OF ULCERS  *URINARY PROBLEMS  *BOWEL PROBLEMS  UNUSUAL RASH Items with * indicate a potential emergency and should be followed up as soon as possible.  Feel free to call the clinic should you have any questions or concerns. The clinic phone number is [phone].  Please show the [facility] at check-in to the Emergency Department and triage nurse.

## 2020-02-28 NOTE — Patient Instructions (Signed)

## 2020-02-28 NOTE — Telephone Encounter (Signed)
Scheduled appointments per 1/13 los. Spoke to patient's wife who is aware of appointments dates and times. Gave patient calendar print out.

## 2020-03-04 ENCOUNTER — Other Ambulatory Visit: Payer: Self-pay | Admitting: Physician Assistant

## 2020-03-04 DIAGNOSIS — B370 Candidal stomatitis: Secondary | ICD-10-CM

## 2020-03-06 ENCOUNTER — Inpatient Hospital Stay: Payer: PPO

## 2020-03-06 ENCOUNTER — Other Ambulatory Visit: Payer: Self-pay

## 2020-03-06 DIAGNOSIS — C457 Mesothelioma of other sites: Secondary | ICD-10-CM

## 2020-03-06 DIAGNOSIS — Z95828 Presence of other vascular implants and grafts: Secondary | ICD-10-CM

## 2020-03-06 DIAGNOSIS — Z5111 Encounter for antineoplastic chemotherapy: Secondary | ICD-10-CM | POA: Diagnosis not present

## 2020-03-06 LAB — CMP (CANCER CENTER ONLY)
ALT: 20 U/L (ref 0–44)
AST: 17 U/L (ref 15–41)
Albumin: 3.5 g/dL (ref 3.5–5.0)
Alkaline Phosphatase: 69 U/L (ref 38–126)
Anion gap: 11 (ref 5–15)
BUN: 16 mg/dL (ref 8–23)
CO2: 25 mmol/L (ref 22–32)
Calcium: 8.7 mg/dL — ABNORMAL LOW (ref 8.9–10.3)
Chloride: 101 mmol/L (ref 98–111)
Creatinine: 0.97 mg/dL (ref 0.61–1.24)
GFR, Estimated: >60 mL/min (ref >60)
Glucose, Bld: 196 mg/dL — ABNORMAL HIGH (ref 70–99)
Potassium: 3.5 mmol/L (ref 3.5–5.1)
Sodium: 137 mmol/L (ref 135–145)
Total Bilirubin: 0.5 mg/dL (ref 0.3–1.2)
Total Protein: 6.5 g/dL (ref 6.5–8.1)

## 2020-03-06 LAB — CBC WITH DIFFERENTIAL (CANCER CENTER ONLY)
Abs Immature Granulocytes: 0.01 10*3/uL (ref 0.00–0.07)
Basophils Absolute: 0 10*3/uL (ref 0.0–0.1)
Basophils Relative: 1 %
Eosinophils Absolute: 0.1 10*3/uL (ref 0.0–0.5)
Eosinophils Relative: 4 %
HCT: 32.7 % — ABNORMAL LOW (ref 39.0–52.0)
Hemoglobin: 11.6 g/dL — ABNORMAL LOW (ref 13.0–17.0)
Immature Granulocytes: 1 %
Lymphocytes Relative: 48 %
Lymphs Abs: 1.1 10*3/uL (ref 0.7–4.0)
MCH: 31.5 pg (ref 26.0–34.0)
MCHC: 35.5 g/dL (ref 30.0–36.0)
MCV: 88.9 fL (ref 80.0–100.0)
Monocytes Absolute: 0.1 10*3/uL (ref 0.1–1.0)
Monocytes Relative: 6 %
Neutro Abs: 0.9 10*3/uL — ABNORMAL LOW (ref 1.7–7.7)
Neutrophils Relative %: 40 %
Platelet Count: 111 10*3/uL — ABNORMAL LOW (ref 150–400)
RBC: 3.68 MIL/uL — ABNORMAL LOW (ref 4.22–5.81)
RDW: 14.6 % (ref 11.5–15.5)
WBC Count: 2.2 10*3/uL — ABNORMAL LOW (ref 4.0–10.5)
nRBC: 0 % (ref 0.0–0.2)

## 2020-03-06 MED ORDER — SODIUM CHLORIDE 0.9% FLUSH
10.0000 mL | Freq: Once | INTRAVENOUS | Status: AC
  Administered 2020-03-06: 10 mL
  Filled 2020-03-06: qty 10

## 2020-03-06 MED ORDER — HEPARIN SOD (PORK) LOCK FLUSH 100 UNIT/ML IV SOLN
500.0000 [IU] | Freq: Once | INTRAVENOUS | Status: AC
  Administered 2020-03-06: 500 [IU]
  Filled 2020-03-06: qty 5

## 2020-03-13 ENCOUNTER — Other Ambulatory Visit: Payer: Self-pay

## 2020-03-13 ENCOUNTER — Ambulatory Visit (INDEPENDENT_AMBULATORY_CARE_PROVIDER_SITE_OTHER): Payer: PPO | Admitting: Emergency Medicine

## 2020-03-13 ENCOUNTER — Inpatient Hospital Stay: Payer: PPO

## 2020-03-13 ENCOUNTER — Encounter: Payer: Self-pay | Admitting: Emergency Medicine

## 2020-03-13 VITALS — BP 124/83 | HR 122 | Temp 97.6°F | Resp 16 | Ht 68.0 in | Wt 192.0 lb

## 2020-03-13 DIAGNOSIS — I4510 Unspecified right bundle-branch block: Secondary | ICD-10-CM | POA: Diagnosis not present

## 2020-03-13 DIAGNOSIS — C457 Mesothelioma of other sites: Secondary | ICD-10-CM

## 2020-03-13 DIAGNOSIS — I2510 Atherosclerotic heart disease of native coronary artery without angina pectoris: Secondary | ICD-10-CM

## 2020-03-13 DIAGNOSIS — Z95828 Presence of other vascular implants and grafts: Secondary | ICD-10-CM

## 2020-03-13 DIAGNOSIS — Z5111 Encounter for antineoplastic chemotherapy: Secondary | ICD-10-CM | POA: Diagnosis not present

## 2020-03-13 DIAGNOSIS — R000 Tachycardia, unspecified: Secondary | ICD-10-CM | POA: Diagnosis not present

## 2020-03-13 DIAGNOSIS — I10 Essential (primary) hypertension: Secondary | ICD-10-CM | POA: Diagnosis not present

## 2020-03-13 DIAGNOSIS — E119 Type 2 diabetes mellitus without complications: Secondary | ICD-10-CM | POA: Diagnosis not present

## 2020-03-13 DIAGNOSIS — Z8546 Personal history of malignant neoplasm of prostate: Secondary | ICD-10-CM | POA: Diagnosis not present

## 2020-03-13 LAB — CMP (CANCER CENTER ONLY)
ALT: 26 U/L (ref 0–44)
AST: 25 U/L (ref 15–41)
Albumin: 3.7 g/dL (ref 3.5–5.0)
Alkaline Phosphatase: 67 U/L (ref 38–126)
Anion gap: 11 (ref 5–15)
BUN: 12 mg/dL (ref 8–23)
CO2: 25 mmol/L (ref 22–32)
Calcium: 8.7 mg/dL — ABNORMAL LOW (ref 8.9–10.3)
Chloride: 104 mmol/L (ref 98–111)
Creatinine: 0.98 mg/dL (ref 0.61–1.24)
GFR, Estimated: >60 mL/min (ref >60)
Glucose, Bld: 156 mg/dL — ABNORMAL HIGH (ref 70–99)
Potassium: 3.6 mmol/L (ref 3.5–5.1)
Sodium: 140 mmol/L (ref 135–145)
Total Bilirubin: 0.4 mg/dL (ref 0.3–1.2)
Total Protein: 6.9 g/dL (ref 6.5–8.1)

## 2020-03-13 LAB — CBC WITH DIFFERENTIAL (CANCER CENTER ONLY)
Abs Immature Granulocytes: 0.04 10*3/uL (ref 0.00–0.07)
Basophils Absolute: 0 10*3/uL (ref 0.0–0.1)
Basophils Relative: 1 %
Eosinophils Absolute: 0.1 10*3/uL (ref 0.0–0.5)
Eosinophils Relative: 3 %
HCT: 32.4 % — ABNORMAL LOW (ref 39.0–52.0)
Hemoglobin: 11.5 g/dL — ABNORMAL LOW (ref 13.0–17.0)
Immature Granulocytes: 1 %
Lymphocytes Relative: 33 %
Lymphs Abs: 1.4 10*3/uL (ref 0.7–4.0)
MCH: 32.2 pg (ref 26.0–34.0)
MCHC: 35.5 g/dL (ref 30.0–36.0)
MCV: 90.8 fL (ref 80.0–100.0)
Monocytes Absolute: 0.7 10*3/uL (ref 0.1–1.0)
Monocytes Relative: 17 %
Neutro Abs: 2 10*3/uL (ref 1.7–7.7)
Neutrophils Relative %: 45 %
Platelet Count: 92 10*3/uL — ABNORMAL LOW (ref 150–400)
RBC: 3.57 MIL/uL — ABNORMAL LOW (ref 4.22–5.81)
RDW: 15.4 % (ref 11.5–15.5)
WBC Count: 4.3 10*3/uL (ref 4.0–10.5)
nRBC: 0 % (ref 0.0–0.2)

## 2020-03-13 LAB — GLUCOSE, POCT (MANUAL RESULT ENTRY): POC Glucose: 184 mg/dl — AB (ref 70–99)

## 2020-03-13 LAB — POCT GLYCOSYLATED HEMOGLOBIN (HGB A1C): Hemoglobin A1C: 7.6 % — AB (ref 4.0–5.6)

## 2020-03-13 LAB — TOTAL PROTEIN, URINE DIPSTICK: Protein, ur: NEGATIVE mg/dL

## 2020-03-13 MED ORDER — HEPARIN SOD (PORK) LOCK FLUSH 100 UNIT/ML IV SOLN
500.0000 [IU] | Freq: Once | INTRAVENOUS | Status: AC
  Administered 2020-03-13: 500 [IU]
  Filled 2020-03-13: qty 5

## 2020-03-13 MED ORDER — SODIUM CHLORIDE 0.9% FLUSH
10.0000 mL | Freq: Once | INTRAVENOUS | Status: AC
  Administered 2020-03-13: 10 mL
  Filled 2020-03-13: qty 10

## 2020-03-13 MED ORDER — METFORMIN HCL 500 MG PO TABS: 500.0000 mg | ORAL_TABLET | Freq: Two times a day (BID) | ORAL | 3 refills | Status: DC

## 2020-03-13 NOTE — Patient Instructions (Addendum)
If you have lab work done today you will be contacted with your lab results within the next 2 weeks.  If you have not heard from [place] then please contact us. The fastest way to get your results is to register for My Chart.   IF you received an x-ray today, you will receive an invoice from [facility]. Please contact [facility] at [phone] with questions or concerns regarding your invoice.   IF you received labwork today, you will receive an invoice from [place]. Please contact [facility] at [phone] with questions or concerns regarding your invoice.   Our billing staff will not be able to assist you with questions regarding bills from these companies.  You will be contacted with the lab results as soon as they are available. The fastest way to get your results is to activate your My Chart account. Instructions are located on the last page of this paperwork. If you have not heard from [place] regarding the results in 2 weeks, please contact this office.     Diabetes Mellitus and Nutrition, Adult When you have diabetes, or diabetes mellitus, it is very important to have healthy eating habits because your blood sugar (glucose) levels are greatly affected by what you eat and drink. Eating healthy foods in the right amounts, at about the same times every day, can help you:  Control your blood glucose.  Lower your risk of heart disease.  Improve your blood pressure.  Reach or maintain a healthy weight. What can affect my meal plan? Every person with diabetes is different, and each person has different needs for a meal plan. Your health care provider may recommend that you work with a dietitian to make a meal plan that is best for you. Your meal plan may vary depending on factors such as:  The calories you need.  The medicines you take.  Your weight.  Your blood glucose, blood pressure, and cholesterol levels.  Your activity level.  Other health conditions you  have, such as heart or kidney disease. How do carbohydrates affect me? Carbohydrates, also called carbs, affect your blood glucose level more than any other type of food. Eating carbs naturally raises the amount of glucose in your blood. Carb counting is a method for keeping track of how many carbs you eat. Counting carbs is important to keep your blood glucose at a healthy level, especially if you use insulin or take certain oral diabetes medicines. It is important to know how many carbs you can safely have in each meal. This is different for every person. Your dietitian can help you calculate how many carbs you should have at each meal and for each snack. How does alcohol affect me? Alcohol can cause a sudden decrease in blood glucose (hypoglycemia), especially if you use insulin or take certain oral diabetes medicines. Hypoglycemia can be a life-threatening condition. Symptoms of hypoglycemia, such as sleepiness, dizziness, and confusion, are similar to symptoms of having too much alcohol.  Do not drink alcohol if: ? Your health care provider tells you not to drink. ? You are pregnant, may be pregnant, or are planning to become pregnant.  If you drink alcohol: ? Do not drink on an empty stomach. ? Limit how much you use to:  0-1 drink a day for women.  0-2 drinks a day for men. ? Be aware of how much alcohol is in your drink. In the [place] one drink equals one 12 oz bottle of beer (355 mL),  one 5 oz glass of wine (148 mL), or one 1 oz glass of hard liquor (44 mL). ? Keep yourself hydrated with water, diet soda, or unsweetened iced tea.  Keep in mind that regular soda, juice, and other mixers may contain a lot of sugar and must be counted as carbs. What are tips for following this plan? Reading food labels  Start by checking the serving size on the "Nutrition Facts" label of packaged foods and drinks. The amount of calories, carbs, fats, and other nutrients listed on the label is based on  one serving of the item. Many items contain more than one serving per package.  Check the total grams (g) of carbs in one serving. You can calculate the number of servings of carbs in one serving by dividing the total carbs by 15. For example, if a food has 30 g of total carbs per serving, it would be equal to 2 servings of carbs.  Check the number of grams (g) of saturated fats and trans fats in one serving. Choose foods that have a low amount or none of these fats.  Check the number of milligrams (mg) of salt (sodium) in one serving. Most people should limit total sodium intake to less than 2,300 mg per day.  Always check the nutrition information of foods labeled as "low-fat" or "nonfat." These foods may be higher in added sugar or refined carbs and should be avoided.  Talk to your dietitian to identify your daily goals for nutrients listed on the label. Shopping  Avoid buying canned, pre-made, or processed foods. These foods tend to be high in fat, sodium, and added sugar.  Shop around the outside edge of the grocery store. This is where you will most often find fresh fruits and vegetables, bulk grains, fresh meats, and fresh dairy. Cooking  Use low-heat cooking methods, such as baking, instead of high-heat cooking methods like deep frying.  Cook using healthy oils, such as olive, canola, or sunflower oil.  Avoid cooking with butter, cream, or high-fat meats. Meal planning  Eat meals and snacks regularly, preferably at the same times every day. Avoid going long periods of time without eating.  Eat foods that are high in fiber, such as fresh fruits, vegetables, beans, and whole grains. Talk with your dietitian about how many servings of carbs you can eat at each meal.  Eat 4-6 oz (112-168 g) of lean protein each day, such as lean meat, chicken, fish, eggs, or tofu. One ounce (oz) of lean protein is equal to: ? 1 oz (28 g) of meat, chicken, or fish. ? 1 egg. ?  cup (62 g) of  tofu.  Eat some foods each day that contain healthy fats, such as avocado, nuts, seeds, and fish.   What foods should I eat? Fruits Berries. Apples. Oranges. Peaches. Apricots. Plums. Grapes. Mango. Papaya. Pomegranate. Kiwi. Cherries. Vegetables Lettuce. Spinach. Leafy greens, including kale, chard, collard greens, and mustard greens. Beets. Cauliflower. Cabbage. Broccoli. Carrots. Green beans. Tomatoes. Peppers. Onions. Cucumbers. Brussels sprouts. Grains Whole grains, such as whole-wheat or whole-grain bread, crackers, tortillas, cereal, and pasta. Unsweetened oatmeal. Quinoa. Brown or wild rice. Meats and other proteins Seafood. Poultry without skin. Lean cuts of poultry and beef. Tofu. Nuts. Seeds. Dairy Low-fat or fat-free dairy products such as milk, yogurt, and cheese. The items listed above may not be a complete list of foods and beverages you can eat. Contact a dietitian for more information. What foods should I avoid? Fruits Fruits canned  with syrup. Vegetables Canned vegetables. Frozen vegetables with butter or cream sauce. Grains Refined white flour and flour products such as bread, pasta, snack foods, and cereals. Avoid all processed foods. Meats and other proteins Fatty cuts of meat. Poultry with skin. Breaded or fried meats. Processed meat. Avoid saturated fats. Dairy Full-fat yogurt, cheese, or milk. Beverages Sweetened drinks, such as soda or iced tea. The items listed above may not be a complete list of foods and beverages you should avoid. Contact a dietitian for more information. Questions to ask a health care provider  Do I need to meet with a diabetes educator?  Do I need to meet with a dietitian?  What number can I call if I have questions?  When are the best times to check my blood glucose? Where to find more information:  American Diabetes Association: diabetes.org  Academy of Nutrition and Dietetics: www.eatright.[employer] of  Diabetes and Digestive and Kidney Diseases: [employer]  Association of Diabetes Care and Education Specialists: www.diabeteseducator.org Summary  It is important to have healthy eating habits because your blood sugar (glucose) levels are greatly affected by what you eat and drink.  A healthy meal plan will help you control your blood glucose and maintain a healthy lifestyle.  Your health care provider may recommend that you work with a dietitian to make a meal plan that is best for you.  Keep in mind that carbohydrates (carbs) and alcohol have immediate effects on your blood glucose levels. It is important to count carbs and to use alcohol carefully. This information is not intended to replace advice given to you by your health care provider. Make sure you discuss any questions you have with your health care provider. Document Revised: 01/09/2019 Document Reviewed: 01/09/2019 Elsevier Patient Education  2021 [employer].

## 2020-03-13 NOTE — Progress Notes (Signed)
Jack Haynes 80 y.o.   Chief Complaint  Patient presents with  . Hypertension    Per patient Dr Worthy Flank nurse told him the Oncology doctor wants A1c check    HISTORY OF PRESENT ILLNESS: This is a 80 y.o. male with history of hypertension and mesothelioma. Here to have hemoglobin A1c checked. Patient feeling well.  Has no complaints or medical concerns. Lab Results  Component Value Date   HGBA1C 6.6 (H) 09/27/2019    HPI   Prior to Admission medications   Medication Sig Start Date End Date Taking? Authorizing Provider  dexamethasone (DECADRON) 4 MG tablet 1 tablet p.o. twice daily the day before, day of and day after chemotherapy every 3 weeks 11/23/19  Yes Curt Bears, MD  folic acid (FOLVITE) 1 MG tablet TAKE 1 TABLET BY MOUTH EVERY DAY 02/18/20  Yes Curt Bears, MD  lidocaine-prilocaine (EMLA) cream Apply to Port-A-Cath site 30-60-minute before treatment. 11/23/19  Yes Curt Bears, MD  losartan-hydrochlorothiazide [facility]) 100-12.5 MG tablet Take 1 tablet by mouth daily. 01/15/20  Yes Westin Knotts, Ines Bloomer, MD  ondansetron (ZOFRAN ODT) 4 MG disintegrating tablet Take 1 tablet (4 mg total) by mouth every 8 (eight) hours as needed for nausea or vomiting. Starting 3 days after chemotherapy 02/28/20  Yes Heilingoetter, Cassandra L, PA-C  prochlorperazine (COMPAZINE) 10 MG tablet Take 1 tablet (10 mg total) by mouth every 6 (six) hours as needed for nausea or vomiting. 11/23/19  Yes Curt Bears, MD  rosuvastatin (CRESTOR) 10 MG tablet Take 1 tablet (10 mg total) by mouth daily. 01/08/20  Yes Jahniya Duzan, Ines Bloomer, MD  acetaminophen (TYLENOL) 500 MG tablet Take 2 tablets (1,000 mg total) by mouth every 6 (six) hours as needed for mild pain or fever. Patient not taking: Reported on 03/13/2020 11/15/19   Barrett, Junie Panning R, PA-C  fluconazole (DIFLUCAN) 100 MG tablet TAKE 1 TABLET BY MOUTH EVERY DAY Patient not taking: Reported on 03/13/2020 03/04/20   Heilingoetter, Cassandra  L, PA-C  Misc Natural Products (NEURIVA PO) Take 1 capsule by mouth daily. Patient not taking: Reported on 03/13/2020    [provider]    No Known Allergies  Patient Active Problem List   Diagnosis Date Noted  . Hypertension 02/28/2020  . Port-A-Cath in place 01/09/2020  . Mesothelioma of left lung ([place]) 11/23/2019  . Encounter for antineoplastic chemotherapy 11/23/2019  . S/P Left VATS drainage of pleural effusion, pleural biopsy, placement of Pleur-x catheter 11/15/2019  . Atherosclerotic cardiovascular disease 10/29/2019  . Pleural effusion 10/29/2019  . History of prostate cancer 10/29/2019  . Essential hypertension 10/09/2019  . Abnormal EKG 01/02/2015  . Prostate cancer ([place]) 11/01/2011    Past Medical History:  Diagnosis Date  . Arthritis   . Cancer (HCC)    hx of prostatae cancer  . Dyspnea    until chest tube placed  . Hypertension   . Mesothelioma [facility])     Past Surgical History:  Procedure Laterality Date  . APPENDECTOMY    . BACK SURGERY  1998   disectomy  . CHEST TUBE INSERTION Left 11/13/2019   Procedure: INSERTION PLEURAL DRAINAGE CATHETER;  Surgeon: Grace Isaac, MD;  Location: [place];  Service: Thoracic;  Laterality: Left;  . EYE SURGERY Left 1965   injury  . JOINT REPLACEMENT Left 2010   left knee replacement  . KNEE SURGERY    . PLEURAL BIOPSY Left 11/13/2019   Procedure: PLEURAL BIOPSY;  Surgeon: Grace Isaac, MD;  Location: [place];  Service: Thoracic;  Laterality: Left;  . PORTACATH PLACEMENT Left 11/26/2019   Procedure: INSERTION PORT-A-CATH BARD POWERPORT 9.6 FR CATHETER;  Surgeon: Grace Isaac, MD;  Location: [place];  Service: Thoracic;  Laterality: Left;  . PROSTATE SURGERY    . REMOVAL OF PLEURAL DRAINAGE CATHETER Left 11/26/2019   Procedure: REMOVAL OF PLEURAL DRAINAGE CATHETER;  Surgeon: Grace Isaac, MD;  Location: [place];  Service: Thoracic;  Laterality: Left;  . SPINE SURGERY    . TALC PLEURODESIS Left  11/13/2019   Procedure: possible TALC PLEURADESIS;  Surgeon: Grace Isaac, MD;  Location: [place];  Service: Thoracic;  Laterality: Left;  . TOTAL KNEE ARTHROPLASTY  02/01/2012   Procedure: TOTAL KNEE ARTHROPLASTY;  Surgeon: Sydnee Cabal, MD;  Location: [facility];  Service: Orthopedics;  Laterality: Left;  Marland Kitchen VIDEO ASSISTED THORACOSCOPY Left 11/13/2019   Procedure: VIDEO ASSISTED THORACOSCOPY;  Surgeon: Grace Isaac, MD;  Location: [place];  Service: Thoracic;  Laterality: Left;  Marland Kitchen VIDEO BRONCHOSCOPY N/A 11/13/2019   Procedure: VIDEO BRONCHOSCOPY;  Surgeon: Grace Isaac, MD;  Location: [facility];  Service: Thoracic;  Laterality: N/A;    Social History   Socioeconomic History  . Marital status: Divorced    Spouse name: Not on file  . Number of children: 2  . Years of education: 49  . Highest education level: Not on file  Occupational History  . 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   Tobacco Use  . Smoking status: Former Smoker    Years: 10.00    Quit date: 02/15/1981    Years since quitting: 39.0  . Smokeless tobacco: Former Systems developer    Types: [place] date: 12/2018  Vaping Use  . Vaping Use: Never used  Substance and Sexual Activity  . Alcohol use: Yes    Alcohol/week: 3.0 standard drinks    Types: 3 Shots of liquor per week  . Drug use: No  . Sexual activity: Not on file  Other Topics Concern  . Not on file  Social History Narrative  . Not on file   Social Determinants of Health   Financial Resource Strain: Not on file  Food Insecurity: Not on file  Transportation Needs: Not on file  Physical Activity: Not on file  Stress: Not on file  Social Connections: Not on file  Intimate Partner Violence: Not on file    Family History  Problem Relation Age of Onset  . Cancer Sister        brain tumor  . Heart disease Brother   . Alzheimer's disease Brother   . Heart disease Sister   . Alzheimer's disease Father   . Hypertension Sister   . Colon  cancer Neg Hx   . Colon polyps Neg Hx   . Esophageal cancer Neg Hx   . Rectal cancer Neg Hx   . Stomach cancer Neg Hx      Review of Systems  Constitutional: Negative.  Negative for chills and fever.  HENT: Negative.  Negative for congestion and sore throat.   Respiratory: Negative.  Negative for cough and shortness of breath.   Cardiovascular: Negative.  Negative for chest pain and palpitations.  Gastrointestinal: Negative.  Negative for abdominal pain, nausea and vomiting.  Genitourinary: Negative.  Negative for dysuria and urgency.  Musculoskeletal: Negative.  Negative for myalgias.  Skin: Negative.  Negative for rash.  Neurological: Negative.  Negative for headaches.  All other systems reviewed and are negative.  Today's Vitals   03/13/20 1014  BP: 124/83  Pulse: (!) 122  Resp: 16  Temp: 97.6 F (36.4 C)  TempSrc: Temporal  SpO2: 96%  Weight: 192 lb (87.1 kg)  Height: 5\' 8"  (1.727 m)   Body mass index is 29.19 kg/m. BP Readings from Last 3 Encounters:  03/13/20 124/83  02/28/20 (!) 164/83  02/07/20 (!) 154/83   Wt Readings from Last 3 Encounters:  03/13/20 192 lb (87.1 kg)  02/28/20 191 lb 6.4 oz (86.8 kg)  02/07/20 200 lb 9.6 oz (91 kg)    Physical Exam Vitals reviewed.  Constitutional:      Appearance: Normal appearance.  HENT:     Head: Normocephalic.  Eyes:     Extraocular Movements: Extraocular movements intact.     Pupils: Pupils are equal, round, and reactive to light.  Cardiovascular:     Rate and Rhythm: Tachycardia present.     Pulses: Normal pulses.     Heart sounds: Normal heart sounds.  Pulmonary:     Effort: Pulmonary effort is normal.     Breath sounds: Normal breath sounds.  Musculoskeletal:        General: Normal range of motion.     Cervical back: Normal range of motion and neck supple.  Skin:    General: Skin is warm and dry.     Capillary Refill: Capillary refill takes less than 2 seconds.  Neurological:     General: No  focal deficit present.     Mental Status: He is alert and oriented to person, place, and time.  Psychiatric:        Mood and Affect: Mood normal.        Behavior: Behavior normal.     Results for orders placed or performed in visit on 03/13/20 (from the past 24 hour(s))  POCT glucose (manual entry)     Status: Abnormal   Collection Time: 03/13/20 10:43 AM  Result Value Ref Range   POC Glucose 184 (A) 70 - 99 mg/dl  POCT glycosylated hemoglobin (Hb A1C)     Status: Abnormal   Collection Time: 03/13/20 10:50 AM  Result Value Ref Range   Hemoglobin A1C 7.6 (A) 4.0 - 5.6 %   HbA1c POC (<> result, manual entry)     HbA1c, POC (prediabetic range)     HbA1c, POC (controlled diabetic range)     EKG: Sinus tachycardia with ventricular rate of 122 and right bundle branch block which is new when compared to one done last September 2021. A total of 45 minutes was spent with the patient, greater than 50% of which was in counseling/coordination of care regarding review of chronic medical problems, new onset diabetes and no treatment with Metformin, education on nutrition, differential diagnosis of tachycardia, new onset right bundle branch block and need for cardiology evaluation, ED precautions, review of EKG and most recent blood work results including today's hemoglobin A1c, prognosis, documentation and need for follow-up.   ASSESSMENT & PLAN: Clinically stable.  Asymptomatic.  Tachycardia of unknown etiology.  Normotensive. Patient has been on corticosteroids. Oxygenating well without tachypnea. Hemoglobin A1c in diabetic range.  We will start Metformin 500 mg twice a day. Normal renal function. No flulike symptoms.  Afebrile. No signs of dehydration. No significant anemia on most recent CBC.  No GI bleed.  Murat was seen today for hypertension.  Diagnoses and all orders for this visit:  Essential hypertension  Atherosclerotic cardiovascular disease  History of prostate  cancer  Mesothelioma of  left lung ([place])  New onset type 2 diabetes mellitus (HCC) -     POCT glycosylated hemoglobin (Hb A1C) -     POCT glucose (manual entry) -     metFORMIN (GLUCOPHAGE) 500 MG tablet; Take 1 tablet (500 mg total) by mouth 2 (two) times daily with a meal.  Tachycardia -     EKG 12-Lead -     Ambulatory referral to Cardiology  Right bundle branch block -     Ambulatory referral to Cardiology    Patient Instructions       If you have lab work done today you will be contacted with your lab results within the next 2 weeks.  If you have not heard from [place] then please contact us. The fastest way to get your results is to register for My Chart.   IF you received an x-ray today, you will receive an invoice from [facility]. Please contact [facility] at [phone] with questions or concerns regarding your invoice.   IF you received labwork today, you will receive an invoice from [facility]. Please contact [facility] at [phone] with questions or concerns regarding your invoice.   Our billing staff will not be able to assist you with questions regarding bills from these companies.  You will be contacted with the lab results as soon as they are available. The fastest way to get your results is to activate your My Chart account. Instructions are located on the last page of this paperwork. If you have not heard from [place] regarding the results in 2 weeks, please contact this office.     Diabetes Mellitus and Nutrition, Adult When you have diabetes, or diabetes mellitus, it is very important to have healthy eating habits because your blood sugar (glucose) levels are greatly affected by what you eat and drink. Eating healthy foods in the right amounts, at about the same times every day, can help you:  Control your blood glucose.  Lower your risk of heart disease.  Improve your blood pressure.  Reach or maintain a healthy weight. What can affect my  meal plan? Every person with diabetes is different, and each person has different needs for a meal plan. Your health care provider may recommend that you work with a dietitian to make a meal plan that is best for you. Your meal plan may vary depending on factors such as:  The calories you need.  The medicines you take.  Your weight.  Your blood glucose, blood pressure, and cholesterol levels.  Your activity level.  Other health conditions you have, such as heart or kidney disease. How do carbohydrates affect me? Carbohydrates, also called carbs, affect your blood glucose level more than any other type of food. Eating carbs naturally raises the amount of glucose in your blood. Carb counting is a method for keeping track of how many carbs you eat. Counting carbs is important to keep your blood glucose at a healthy level, especially if you use insulin or take certain oral diabetes medicines. It is important to know how many carbs you can safely have in each meal. This is different for every person. Your dietitian can help you calculate how many carbs you should have at each meal and for each snack. How does alcohol affect me? Alcohol can cause a sudden decrease in blood glucose (hypoglycemia), especially if you use insulin or take certain oral diabetes medicines. Hypoglycemia can be a life-threatening condition. Symptoms of hypoglycemia, such as sleepiness, dizziness, and confusion, are similar  to symptoms of having too much alcohol.  Do not drink alcohol if: ? Your health care provider tells you not to drink. ? You are pregnant, may be pregnant, or are planning to become pregnant.  If you drink alcohol: ? Do not drink on an empty stomach. ? Limit how much you use to:  0-1 drink a day for women.  0-2 drinks a day for men. ? Be aware of how much alcohol is in your drink. In the [place] one drink equals one 12 oz bottle of beer (355 mL), one 5 oz glass of wine (148 mL), or one 1 oz glass of  hard liquor (44 mL). ? Keep yourself hydrated with water, diet soda, or unsweetened iced tea.  Keep in mind that regular soda, juice, and other mixers may contain a lot of sugar and must be counted as carbs. What are tips for following this plan? Reading food labels  Start by checking the serving size on the "Nutrition Facts" label of packaged foods and drinks. The amount of calories, carbs, fats, and other nutrients listed on the label is based on one serving of the item. Many items contain more than one serving per package.  Check the total grams (g) of carbs in one serving. You can calculate the number of servings of carbs in one serving by dividing the total carbs by 15. For example, if a food has 30 g of total carbs per serving, it would be equal to 2 servings of carbs.  Check the number of grams (g) of saturated fats and trans fats in one serving. Choose foods that have a low amount or none of these fats.  Check the number of milligrams (mg) of salt (sodium) in one serving. Most people should limit total sodium intake to less than 2,300 mg per day.  Always check the nutrition information of foods labeled as "low-fat" or "nonfat." These foods may be higher in added sugar or refined carbs and should be avoided.  Talk to your dietitian to identify your daily goals for nutrients listed on the label. Shopping  Avoid buying canned, pre-made, or processed foods. These foods tend to be high in fat, sodium, and added sugar.  Shop around the outside edge of the grocery store. This is where you will most often find fresh fruits and vegetables, bulk grains, fresh meats, and fresh dairy. Cooking  Use low-heat cooking methods, such as baking, instead of high-heat cooking methods like deep frying.  Cook using healthy oils, such as olive, canola, or sunflower oil.  Avoid cooking with butter, cream, or high-fat meats. Meal planning  Eat meals and snacks regularly, preferably at the same times  every day. Avoid going long periods of time without eating.  Eat foods that are high in fiber, such as fresh fruits, vegetables, beans, and whole grains. Talk with your dietitian about how many servings of carbs you can eat at each meal.  Eat 4-6 oz (112-168 g) of lean protein each day, such as lean meat, chicken, fish, eggs, or tofu. One ounce (oz) of lean protein is equal to: ? 1 oz (28 g) of meat, chicken, or fish. ? 1 egg. ?  cup (62 g) of tofu.  Eat some foods each day that contain healthy fats, such as avocado, nuts, seeds, and fish.   What foods should I eat? Fruits Berries. Apples. Oranges. Peaches. Apricots. Plums. Grapes. Mango. Papaya. Pomegranate. Kiwi. Cherries. Vegetables Lettuce. Spinach. Leafy greens, including kale, chard, collard greens, and mustard  greens. Beets. Cauliflower. Cabbage. Broccoli. Carrots. Green beans. Tomatoes. Peppers. Onions. Cucumbers. Brussels sprouts. Grains Whole grains, such as whole-wheat or whole-grain bread, crackers, tortillas, cereal, and pasta. Unsweetened oatmeal. Quinoa. Brown or wild rice. Meats and other proteins Seafood. Poultry without skin. Lean cuts of poultry and beef. Tofu. Nuts. Seeds. Dairy Low-fat or fat-free dairy products such as milk, yogurt, and cheese. The items listed above may not be a complete list of foods and beverages you can eat. Contact a dietitian for more information. What foods should I avoid? Fruits Fruits canned with syrup. Vegetables Canned vegetables. Frozen vegetables with butter or cream sauce. Grains Refined white flour and flour products such as bread, pasta, snack foods, and cereals. Avoid all processed foods. Meats and other proteins Fatty cuts of meat. Poultry with skin. Breaded or fried meats. Processed meat. Avoid saturated fats. Dairy Full-fat yogurt, cheese, or milk. Beverages Sweetened drinks, such as soda or iced tea. The items listed above may not be a complete list of foods and beverages  you should avoid. Contact a dietitian for more information. Questions to ask a health care provider  Do I need to meet with a diabetes educator?  Do I need to meet with a dietitian?  What number can I call if I have questions?  When are the best times to check my blood glucose? Where to find more information:  American Diabetes Association: diabetes.org  Academy of Nutrition and Dietetics: www.eatright.[employer] of Diabetes and Digestive and Kidney Diseases: [employer]  Association of Diabetes Care and Education Specialists: www.diabeteseducator.org Summary  It is important to have healthy eating habits because your blood sugar (glucose) levels are greatly affected by what you eat and drink.  A healthy meal plan will help you control your blood glucose and maintain a healthy lifestyle.  Your health care provider may recommend that you work with a dietitian to make a meal plan that is best for you.  Keep in mind that carbohydrates (carbs) and alcohol have immediate effects on your blood glucose levels. It is important to count carbs and to use alcohol carefully. This information is not intended to replace advice given to you by your health care provider. Make sure you discuss any questions you have with your health care provider. Document Revised: 01/09/2019 Document Reviewed: 01/09/2019 Elsevier Patient Education  2021 Elsevier Inc.     Agustina Caroli, MD [facility]

## 2020-03-13 NOTE — Patient Instructions (Signed)
Central Line, Adult A central line is a long, thin tube (catheter) that is put into a vein so that it goes to a large vein above your heart. It can be used to:  Give you medicine or fluids.  Give you food and nutrients.  Take blood or give you blood for testing or treatments. Types of central lines There are four main types of central lines:  Peripherally inserted central catheter (PICC) line. This type is usually put in the upper arm and goes up the arm to the heart.  Tunneled central line. This type is placed in a large vein in the neck, chest, or groin. It is tunneled under the skin and brought out through a second incision.  Non-tunneled central line. This type is used for a shorter time than other types, usually for 7 days at the most. It is inserted in the neck, chest, or groin.  Implanted port. This type can stay in place longer than other types of central lines. It is normally put in the upper chest but can also be placed in the upper arm or the belly. Surgery is needed to put it in and take it out. The type of central line you get will depend on how long you need it and your medical condition.   Tell a doctor about:  Any allergies you have.  All medicines you are taking. These include vitamins, herbs, eye drops, creams, and over-the-counter medicines.  Any problems you or family members have had with anesthetic medicines.  Any blood disorders you have.  Any surgeries you have had.  Any medical conditions you have.  Whether you are pregnant or may be pregnant. What are the risks? Generally, central lines are safe. However, problems may occur, including:  Infection.  A blood clot.  Bleeding from the place where the central line was inserted.  Getting a hole or crack in the central line. If this happens, the central line will need to be replaced.  Central line failure.  The catheter moving or coming out of place. What happens before the  procedure? Medicines  Ask your doctor about changing or stopping: ? Your normal medicines. ? Vitamins, herbs, and supplements. ? Over-the-counter medicines.  Do not take aspirin or ibuprofen unless you are told to. General instructions  Follow instructions from your doctor about eating or drinking.  For your safety, your doctor may: ? Mark the area of the procedure. ? Remove hair at the procedure site. ? Ask you to wash with a soap that kills germs.  Plan to have a responsible adult take you home from the hospital or clinic.  If you will be going home right after the procedure, plan to have a responsible adult care for you for the time you are told. This is important. What happens during the procedure?  An IV tube will be put into one of your veins.  You may be given: ? A sedative. This medicine helps you relax. ? Anesthetics. These medicines numb certain areas of your body.  Your skin will be cleaned with a germ-killing (antiseptic) solution. You may be covered with clean drapes.  Your blood pressure, heart rate, breathing rate, and blood oxygen level will be monitored during the procedure.  The central line will be put into the vein and moved through it to the correct spot. The doctor may use X-ray equipment to help guide the central line to the right place.  A bandage (dressing) will be placed over the insertion area.   The procedure may vary among doctors and hospitals. What can I expect after the procedure?  You will be monitored until you leave the hospital or clinic. This includes checking your blood pressure, heart rate, breathing rate, and blood oxygen level.  Caps may be placed on the ends of the central line tubing.  If you were given a sedative during your procedure, do not drive or use machines until your doctor says that it is safe. Follow these instructions at home: Caring for the tube  Follow instructions from your doctor about: ? Flushing the  tube. ? Cleaning the tube and the area around it.  Only use germ-free (sterile) supplies to flush. The supplies should be from your doctor, a pharmacy, or another place that your doctor recommends.  Before you flush the tube or clean the area around the tube: ? Wash your hands with soap and water for at least 20 seconds. If you cannot use soap and water, use hand sanitizer. ? Clean the central line hub with rubbing alcohol. To do this:  Scrub it using a twisting motion and rub for 10 to 15 seconds or for 30 twists. Follow the manufacturer's instructions.  Be sure you scrub the top of the hub, not just the sides. Never reuse alcohol pads.  Let the hub dry before use. Keep it from touching anything while drying.   Caring for your skin  Check the skin around the central line every day for signs of infection. Check for: ? Redness, swelling, or pain. ? Fluid or blood. ? Warmth. ? Pus or a bad smell.  Keep the area where the tube was put in clean and dry.  Change bandages only as told by your doctor.  Keep your bandage dry. If a bandage gets wet, have it changed right away. General instructions  Keep the tube clamped, unless it is being used.  If you or someone else accidentally pulls on the tube, make sure: ? The bandage is okay. ? There is no bleeding. ? The tube has not been pulled out.  Do not use scissors or sharp objects near the tube.  Do not take baths, swim, or use a hot tub until your doctor says it is okay. Ask your doctor if you may take showers. You may only be allowed to take sponge baths.  Ask your doctor what activities are safe for you. Your doctor may tell you not to lift anything or move your arm too much.  Take over-the-counter and prescription medicines only as told by your doctor.  Keep all follow-up visits. Storing and throwing away supplies  Keep your supplies in a clean, dry location.  Throw away any used syringes in a container that is only for  sharp items (sharps container). You can buy a sharps container from a pharmacy, or you can make one by using an empty hard plastic bottle with a cover.  Place any used bandages or infusion bags into a plastic bag. Throw that bag in the trash. Contact a doctor if:  You have any of these signs of infection where the tube was put in: ? Redness, swelling, or pain. ? Fluid or blood. ? Warmth. ? Pus or a bad smell. Get help right away if:  You have: ? A fever or chills. ? Shortness of breath. ? Pain in your chest. ? A fast heartbeat. ? Swelling in your neck, face, chest, or arm.  You feel dizzy or you faint.  There are red lines coming from   where the tube was put in.  The area where the tube was put in is bleeding and the bleeding will not stop.  Your tube is hard to flush.  You do not get a blood return from the tube.  The tube gets loose or comes out.  The tube has a hole or a tear.  The tube leaks. Summary  A central line is a long, thin tube (catheter) that is put in your vein. It can be used to give you medicine, food, or fluids.  Follow instructions from your doctor about flushing and cleaning the tube.  Keep the area where the tube was put in clean and dry.  Ask your doctor what activities are safe for you. This information is not intended to replace advice given to you by your health care provider. Make sure you discuss any questions you have with your health care provider. Document Revised: 10/04/2019 Document Reviewed: 10/04/2019 Elsevier Patient Education  2021 [employer]  

## 2020-03-20 ENCOUNTER — Inpatient Hospital Stay: Payer: PPO

## 2020-03-20 ENCOUNTER — Inpatient Hospital Stay: Payer: PPO | Admitting: Internal Medicine

## 2020-03-20 ENCOUNTER — Other Ambulatory Visit: Payer: Self-pay

## 2020-03-20 ENCOUNTER — Inpatient Hospital Stay: Payer: PPO | Attending: Internal Medicine

## 2020-03-20 ENCOUNTER — Encounter: Payer: Self-pay | Admitting: Internal Medicine

## 2020-03-20 VITALS — BP 136/83 | HR 87 | Temp 97.4°F | Resp 18 | Ht 68.0 in | Wt 196.7 lb

## 2020-03-20 DIAGNOSIS — Z5111 Encounter for antineoplastic chemotherapy: Secondary | ICD-10-CM | POA: Insufficient documentation

## 2020-03-20 DIAGNOSIS — K449 Diaphragmatic hernia without obstruction or gangrene: Secondary | ICD-10-CM | POA: Diagnosis not present

## 2020-03-20 DIAGNOSIS — Z7982 Long term (current) use of aspirin: Secondary | ICD-10-CM | POA: Insufficient documentation

## 2020-03-20 DIAGNOSIS — C457 Mesothelioma of other sites: Secondary | ICD-10-CM

## 2020-03-20 DIAGNOSIS — M129 Arthropathy, unspecified: Secondary | ICD-10-CM | POA: Diagnosis not present

## 2020-03-20 DIAGNOSIS — Z95828 Presence of other vascular implants and grafts: Secondary | ICD-10-CM

## 2020-03-20 DIAGNOSIS — Z7984 Long term (current) use of oral hypoglycemic drugs: Secondary | ICD-10-CM | POA: Insufficient documentation

## 2020-03-20 DIAGNOSIS — R5383 Other fatigue: Secondary | ICD-10-CM | POA: Insufficient documentation

## 2020-03-20 DIAGNOSIS — E119 Type 2 diabetes mellitus without complications: Secondary | ICD-10-CM | POA: Diagnosis not present

## 2020-03-20 DIAGNOSIS — C450 Mesothelioma of pleura: Secondary | ICD-10-CM | POA: Insufficient documentation

## 2020-03-20 DIAGNOSIS — I4510 Unspecified right bundle-branch block: Secondary | ICD-10-CM | POA: Diagnosis not present

## 2020-03-20 DIAGNOSIS — I700 Atherosclerosis of aorta: Secondary | ICD-10-CM | POA: Diagnosis not present

## 2020-03-20 DIAGNOSIS — Z79899 Other long term (current) drug therapy: Secondary | ICD-10-CM | POA: Diagnosis not present

## 2020-03-20 DIAGNOSIS — R0602 Shortness of breath: Secondary | ICD-10-CM | POA: Insufficient documentation

## 2020-03-20 DIAGNOSIS — I10 Essential (primary) hypertension: Secondary | ICD-10-CM | POA: Insufficient documentation

## 2020-03-20 DIAGNOSIS — R140 Abdominal distension (gaseous): Secondary | ICD-10-CM | POA: Insufficient documentation

## 2020-03-20 LAB — CBC WITH DIFFERENTIAL (CANCER CENTER ONLY)
Abs Immature Granulocytes: 0.08 10*3/uL — ABNORMAL HIGH (ref 0.00–0.07)
Basophils Absolute: 0 10*3/uL (ref 0.0–0.1)
Basophils Relative: 0 %
Eosinophils Absolute: 0 10*3/uL (ref 0.0–0.5)
Eosinophils Relative: 0 %
HCT: 31.7 % — ABNORMAL LOW (ref 39.0–52.0)
Hemoglobin: 11 g/dL — ABNORMAL LOW (ref 13.0–17.0)
Immature Granulocytes: 1 %
Lymphocytes Relative: 13 %
Lymphs Abs: 0.8 10*3/uL (ref 0.7–4.0)
MCH: 32 pg (ref 26.0–34.0)
MCHC: 34.7 g/dL (ref 30.0–36.0)
MCV: 92.2 fL (ref 80.0–100.0)
Monocytes Absolute: 0.4 10*3/uL (ref 0.1–1.0)
Monocytes Relative: 6 %
Neutro Abs: 4.8 10*3/uL (ref 1.7–7.7)
Neutrophils Relative %: 80 %
Platelet Count: 219 10*3/uL (ref 150–400)
RBC: 3.44 MIL/uL — ABNORMAL LOW (ref 4.22–5.81)
RDW: 15.9 % — ABNORMAL HIGH (ref 11.5–15.5)
WBC Count: 6 10*3/uL (ref 4.0–10.5)
nRBC: 0 % (ref 0.0–0.2)

## 2020-03-20 LAB — CMP (CANCER CENTER ONLY)
ALT: 24 U/L (ref 0–44)
AST: 18 U/L (ref 15–41)
Albumin: 3.9 g/dL (ref 3.5–5.0)
Alkaline Phosphatase: 62 U/L (ref 38–126)
Anion gap: 10 (ref 5–15)
BUN: 22 mg/dL (ref 8–23)
CO2: 23 mmol/L (ref 22–32)
Calcium: 8.9 mg/dL (ref 8.9–10.3)
Chloride: 104 mmol/L (ref 98–111)
Creatinine: 1.07 mg/dL (ref 0.61–1.24)
GFR, Estimated: >60 mL/min (ref >60)
Glucose, Bld: 217 mg/dL — ABNORMAL HIGH (ref 70–99)
Potassium: 4.1 mmol/L (ref 3.5–5.1)
Sodium: 137 mmol/L (ref 135–145)
Total Bilirubin: 0.4 mg/dL (ref 0.3–1.2)
Total Protein: 6.7 g/dL (ref 6.5–8.1)

## 2020-03-20 LAB — TOTAL PROTEIN, URINE DIPSTICK: Protein, ur: NEGATIVE mg/dL

## 2020-03-20 MED ORDER — SODIUM CHLORIDE 0.9 % IV SOLN
10.0000 mg | Freq: Once | INTRAVENOUS | Status: AC
  Administered 2020-03-20: 10 mg via INTRAVENOUS
  Filled 2020-03-20: qty 10

## 2020-03-20 MED ORDER — PALONOSETRON HCL INJECTION 0.25 MG/5ML
INTRAVENOUS | Status: AC
  Filled 2020-03-20: qty 5

## 2020-03-20 MED ORDER — CYANOCOBALAMIN 1000 MCG/ML IJ SOLN
1000.0000 ug | Freq: Once | INTRAMUSCULAR | Status: AC
  Administered 2020-03-20: 1000 ug via INTRAMUSCULAR

## 2020-03-20 MED ORDER — SODIUM CHLORIDE 0.9% FLUSH
10.0000 mL | Freq: Once | INTRAVENOUS | Status: AC
  Administered 2020-03-20: 10 mL
  Filled 2020-03-20: qty 10

## 2020-03-20 MED ORDER — SODIUM CHLORIDE 0.9 % IV SOLN
150.0000 mg | Freq: Once | INTRAVENOUS | Status: AC
  Administered 2020-03-20: 150 mg via INTRAVENOUS
  Filled 2020-03-20: qty 150

## 2020-03-20 MED ORDER — SODIUM CHLORIDE 0.9 % IV SOLN
481.5000 mg | Freq: Once | INTRAVENOUS | Status: AC
  Administered 2020-03-20: 480 mg via INTRAVENOUS
  Filled 2020-03-20: qty 48

## 2020-03-20 MED ORDER — HEPARIN SOD (PORK) LOCK FLUSH 100 UNIT/ML IV SOLN
500.0000 [IU] | Freq: Once | INTRAVENOUS | Status: AC | PRN
  Administered 2020-03-20: 500 [IU]
  Filled 2020-03-20: qty 5

## 2020-03-20 MED ORDER — SODIUM CHLORIDE 0.9 % IV SOLN
15.0000 mg/kg | Freq: Once | INTRAVENOUS | Status: AC
  Administered 2020-03-20: 1300 mg via INTRAVENOUS
  Filled 2020-03-20: qty 48

## 2020-03-20 MED ORDER — CYANOCOBALAMIN 1000 MCG/ML IJ SOLN
INTRAMUSCULAR | Status: AC
  Filled 2020-03-20: qty 1

## 2020-03-20 MED ORDER — SODIUM CHLORIDE 0.9 % IV SOLN
Freq: Once | INTRAVENOUS | Status: AC
  Filled 2020-03-20: qty 250

## 2020-03-20 MED ORDER — PALONOSETRON HCL INJECTION 0.25 MG/5ML
0.2500 mg | Freq: Once | INTRAVENOUS | Status: AC
  Administered 2020-03-20: 0.25 mg via INTRAVENOUS

## 2020-03-20 MED ORDER — SODIUM CHLORIDE 0.9% FLUSH
10.0000 mL | INTRAVENOUS | Status: DC | PRN
  Administered 2020-03-20: 10 mL
  Filled 2020-03-20: qty 10

## 2020-03-20 MED ORDER — SODIUM CHLORIDE 0.9 % IV SOLN
500.0000 mg/m2 | Freq: Once | INTRAVENOUS | Status: AC
  Administered 2020-03-20: 1000 mg via INTRAVENOUS
  Filled 2020-03-20: qty 40

## 2020-03-20 NOTE — Patient Instructions (Signed)
[facility] Discharge Instructions for Patients Receiving Chemotherapy  Today you received the following chemotherapy agents bevacizumab, pemetrexed, carboplatin  To help prevent nausea and vomiting after your treatment, we encourage you to take your nausea medication as directed.   If you develop nausea and vomiting that is not controlled by your nausea medication, call the clinic.   BELOW ARE SYMPTOMS THAT SHOULD BE REPORTED IMMEDIATELY:  *FEVER GREATER THAN 100.5 F  *CHILLS WITH OR WITHOUT FEVER  NAUSEA AND VOMITING THAT IS NOT CONTROLLED WITH YOUR NAUSEA MEDICATION  *UNUSUAL SHORTNESS OF BREATH  *UNUSUAL BRUISING OR BLEEDING  TENDERNESS IN MOUTH AND THROAT WITH OR WITHOUT PRESENCE OF ULCERS  *URINARY PROBLEMS  *BOWEL PROBLEMS  UNUSUAL RASH Items with * indicate a potential emergency and should be followed up as soon as possible.  Feel free to call the clinic should you have any questions or concerns. The clinic phone number is [phone].  Please show the [facility] at check-in to the Emergency Department and triage nurse.

## 2020-03-20 NOTE — Progress Notes (Signed)
[facility] Telephone:([phone]   Fax:([phone]) [phone]  OFFICE PROGRESS NOTE  [name], MD [place] [address]  DIAGNOSIS: Stage I/II malignant pleural mesothelioma involving the left hemithorax diagnosed in September 2021.  PRIOR THERAPY: None.  CURRENT THERAPY:  Systemic chemotherapy with carboplatin for AUC of 5, Alimta 500 mg/M2 and Avastin 15 mg/KG every 3 weeks.  Status post 5 cycles.  INTERVAL HISTORY: Jack Haynes 80 y.o. male returns to the clinic today for follow-up visit accompanied by his wife.  The patient started Metformin recently for his diabetes.  He was also found to have new right bundle branch block and he is getting evaluation by cardiology.  He denied having any current chest pain, shortness breath, cough or hemoptysis.  He denied having any fever or chills.  He has no nausea, vomiting, diarrhea or constipation.  He has no headache or visual changes.  He is here today for evaluation before starting cycle #6 of his treatment.  MEDICAL HISTORY: Past Medical History:  Diagnosis Date  . Arthritis   . Cancer (HCC)    hx of prostatae cancer  . Dyspnea    until chest tube placed  . Hypertension   . Mesothelioma [facility])     ALLERGIES:  has No Known Allergies.  MEDICATIONS:  Current Outpatient Medications  Medication Sig Dispense Refill  . acetaminophen (TYLENOL) 500 MG tablet Take 2 tablets (1,000 mg total) by mouth every 6 (six) hours as needed for mild pain or fever. (Patient not taking: Reported on 03/13/2020) 30 tablet 0  . dexamethasone (DECADRON) 4 MG tablet 1 tablet p.o. twice daily the day before, day of and day after chemotherapy every 3 weeks 40 tablet 1  . fluconazole (DIFLUCAN) 100 MG tablet TAKE 1 TABLET BY MOUTH EVERY DAY (Patient not taking: Reported on 03/13/2020) 7 tablet 0  . folic acid (FOLVITE) 1 MG tablet TAKE 1 TABLET BY MOUTH EVERY DAY 90 tablet 1  . lidocaine-prilocaine (EMLA) cream Apply to  Port-A-Cath site 30-60-minute before treatment. 30 g 0  . losartan-hydrochlorothiazide (HYZAAR) 100-12.5 MG tablet Take 1 tablet by mouth daily. 90 tablet 3  . metFORMIN (GLUCOPHAGE) 500 MG tablet Take 1 tablet (500 mg total) by mouth 2 (two) times daily with a meal. 180 tablet 3  . Misc Natural Products (NEURIVA PO) Take 1 capsule by mouth daily. (Patient not taking: Reported on 03/13/2020)    . ondansetron (ZOFRAN ODT) 4 MG disintegrating tablet Take 1 tablet (4 mg total) by mouth every 8 (eight) hours as needed for nausea or vomiting. Starting 3 days after chemotherapy 30 tablet 1  . prochlorperazine (COMPAZINE) 10 MG tablet Take 1 tablet (10 mg total) by mouth every 6 (six) hours as needed for nausea or vomiting. 30 tablet 0  . rosuvastatin (CRESTOR) 10 MG tablet Take 1 tablet (10 mg total) by mouth daily. 90 tablet 3   No current facility-administered medications for this visit.    SURGICAL HISTORY:  Past Surgical History:  Procedure Laterality Date  . APPENDECTOMY    . BACK SURGERY  1998   disectomy  . CHEST TUBE INSERTION Left 11/13/2019   Procedure: INSERTION PLEURAL DRAINAGE CATHETER;  Surgeon: Grace Isaac, MD;  Location: [facility];  Service: Thoracic;  Laterality: Left;  . EYE SURGERY Left 1965   injury  . JOINT REPLACEMENT Left 2010   left knee replacement  . KNEE SURGERY    . PLEURAL BIOPSY Left 11/13/2019  Procedure: PLEURAL BIOPSY;  Surgeon: Grace Isaac, MD;  Location: [facility];  Service: Thoracic;  Laterality: Left;  . PORTACATH PLACEMENT Left 11/26/2019   Procedure: INSERTION PORT-A-CATH BARD POWERPORT 9.6 FR CATHETER;  Surgeon: Grace Isaac, MD;  Location: [facility];  Service: Thoracic;  Laterality: Left;  . PROSTATE SURGERY    . REMOVAL OF PLEURAL DRAINAGE CATHETER Left 11/26/2019   Procedure: REMOVAL OF PLEURAL DRAINAGE CATHETER;  Surgeon: Grace Isaac, MD;  Location: [facility];  Service: Thoracic;  Laterality: Left;  . SPINE SURGERY    . TALC PLEURODESIS  Left 11/13/2019   Procedure: possible TALC PLEURADESIS;  Surgeon: Grace Isaac, MD;  Location: [facility];  Service: Thoracic;  Laterality: Left;  . TOTAL KNEE ARTHROPLASTY  02/01/2012   Procedure: TOTAL KNEE ARTHROPLASTY;  Surgeon: Sydnee Cabal, MD;  Location: [facility];  Service: Orthopedics;  Laterality: Left;  Marland Kitchen VIDEO ASSISTED THORACOSCOPY Left 11/13/2019   Procedure: VIDEO ASSISTED THORACOSCOPY;  Surgeon: Grace Isaac, MD;  Location: [facility];  Service: Thoracic;  Laterality: Left;  Marland Kitchen VIDEO BRONCHOSCOPY N/A 11/13/2019   Procedure: VIDEO BRONCHOSCOPY;  Surgeon: Grace Isaac, MD;  Location: [facility];  Service: Thoracic;  Laterality: N/A;    REVIEW OF SYSTEMS:  A comprehensive review of systems was negative except for: Constitutional: positive for fatigue   PHYSICAL EXAMINATION: General appearance: alert, cooperative and no distress Head: Normocephalic, without obvious abnormality, atraumatic Neck: no adenopathy, no JVD, supple, symmetrical, trachea midline and thyroid not enlarged, symmetric, no tenderness/mass/nodules Lymph nodes: Cervical, supraclavicular, and axillary nodes normal. Resp: clear to auscultation bilaterally Back: symmetric, no curvature. ROM normal. No CVA tenderness. Cardio: regular rate and rhythm, S1, S2 normal, no murmur, click, rub or gallop GI: soft, non-tender; bowel sounds normal; no masses,  no organomegaly Extremities: extremities normal, atraumatic, no cyanosis or edema  ECOG PERFORMANCE STATUS: 1 - Symptomatic but completely ambulatory  Blood pressure 136/83, pulse 87, temperature (!) 97.4 F (36.3 C), temperature source Tympanic, resp. rate 18, height 5\' 8"  (1.727 m), weight 196 lb 11.2 oz (89.2 kg), SpO2 99 %.  LABORATORY DATA: Lab Results  Component Value Date   WBC 6.0 03/20/2020   HGB 11.0 (L) 03/20/2020   HCT 31.7 (L) 03/20/2020   MCV 92.2 03/20/2020   PLT 219 03/20/2020      Chemistry      Component Value Date/Time   NA 140 03/13/2020  0744   NA 141 09/24/2019 0832   K 3.6 03/13/2020 0744   CL 104 03/13/2020 0744   CO2 25 03/13/2020 0744   BUN 12 03/13/2020 0744   BUN 17 09/24/2019 0832   CREATININE 0.98 03/13/2020 0744   CREATININE 1.01 12/24/2014 0916      Component Value Date/Time   CALCIUM 8.7 (L) 03/13/2020 0744   ALKPHOS 67 03/13/2020 0744   AST 25 03/13/2020 0744   ALT 26 03/13/2020 0744   BILITOT 0.4 03/13/2020 0744       RADIOGRAPHIC STUDIES: No results found.  ASSESSMENT AND PLAN: This is a very pleasant 80 years old white male recently diagnosed with malignant pleural mesothelioma involving the left hemithorax. The patient is currently undergoing systemic chemotherapy with carboplatin for AUC of 5, Alimta 500 mg/M2 and Avastin 15 mg/KG every 3 weeks.  Status post 5 cycles. The patient continues to tolerate this treatment well with no concerning adverse effects. I recommended for him to proceed with cycle #6 today as planned. I will see him back for follow-up visit  in 3 weeks for evaluation with repeat CT scan of the chest for restaging of his disease. The patient was advised to call immediately if he has any concerning symptoms in the interval. The patient voices understanding of current disease status and treatment options and is in agreement with the current care plan.  All questions were answered. The patient knows to call the clinic with any problems, questions or concerns. We can certainly see the patient much sooner if necessary.   Disclaimer: This note was dictated with voice recognition software. Similar sounding words can inadvertently be transcribed and may not be corrected upon review.

## 2020-03-23 NOTE — Progress Notes (Signed)
Cardiology Office Note:   Date:  03/24/2020  NAME:  Jack Haynes    MRN: [id number] DOB:  1940-11-04   PCP:  [name], MD  Cardiologist:  No primary care provider on file.   Referring MD: [name], *   Chief Complaint  Patient presents with  . Abnormal ECG        History of Present Illness:   Jack Haynes is a 80 y.o. male with a hx of mesothelioma, HTN, DM, HLD who is being seen today for the evaluation of RBBB at the request of Sagardia, Ines Bloomer, MD. On chemo for mesothelioma. Seen by PCP 03/13/2020 and had sinus tachycardia 121 bpm with RBBB. EKG 10/29/2019 without RBBB. Echo 10/2019 with normal LV function. He presents for evaluation of right bundle branch block and tachycardia.  Recent diagnosis of mesothelioma.  He is undergoing chemotherapy every 3 weeks.  Chemotherapy really drains him.  He reports he is profoundly tired with episodes of extreme fatigue.  He reports he may feel jittery after chemotherapy but really does not describe any rapid heartbeat sensation.  He just reports a sensation of being jittery.  Of note his hemoglobin has drifted down around 11.  TSH also was abnormal in September at 8.3.  No follow-up labs ordered from what I can tell.  He reports no chest pain or shortness of breath.  She has no limitations with exertional chest pain.  He reports he can ride a stationary bike for 15 minutes then major issues.  He does this 3 days a week.  He denies any chest pain or shortness of breath.  He said the current pleural effusions underwent pleurodesis.  Apparently his life expectancy is roughly 2 years per report.  He presents with his fiance.  Medical history significant for hypertension.  BP 128/72.  He is diabetic as well.  This is a new diagnosis for him.  He has never had a heart attack or stroke.  No strong family history of heart disease.  His EKG today demonstrates likely an incomplete right bundle branch block with QRS duration 94 ms.  He  is in sinus rhythm with a heart rate of 86.  I did review his EKG from his primary care physician office demonstrates a 40 bundle-branch block and is tachycardic.  This is not surprising.  Echocardiogram in September was normal.  No recent iron profile.  He is a former smoker.  Smoked for roughly 15 years.  His mesothelioma exposure was occupational.  He drinks alcohol occasionally.  No drug use is reported.  He is retired.  He has 2 sons.  He presents with his fiance in the office.  Problem List 1. Stage I/II malignant pleural mesothelioma L hemithorax -currently on chemo 2. Diabetes -A1c 7.6 3. HLD -T chol 184, HDL 40, LDL 127, TG 94 4. HTN 5. RBBB  Past Medical History: Past Medical History:  Diagnosis Date  . Arthritis   . Cancer (HCC)    hx of prostatae cancer  . Dyspnea    until chest tube placed  . Hypertension   . Mesothelioma [facility])     Past Surgical History: Past Surgical History:  Procedure Laterality Date  . APPENDECTOMY    . BACK SURGERY  1998   disectomy  . CHEST TUBE INSERTION Left 11/13/2019   Procedure: INSERTION PLEURAL DRAINAGE CATHETER;  Surgeon: Grace Isaac, MD;  Location: [place];  Service: Thoracic;  Laterality: Left;  . EYE SURGERY Left  1965   injury  . JOINT REPLACEMENT Left 2010   left knee replacement  . KNEE SURGERY    . PLEURAL BIOPSY Left 11/13/2019   Procedure: PLEURAL BIOPSY;  Surgeon: Grace Isaac, MD;  Location: [place];  Service: Thoracic;  Laterality: Left;  . PORTACATH PLACEMENT Left 11/26/2019   Procedure: INSERTION PORT-A-CATH BARD POWERPORT 9.6 FR CATHETER;  Surgeon: Grace Isaac, MD;  Location: [place];  Service: Thoracic;  Laterality: Left;  . PROSTATE SURGERY    . REMOVAL OF PLEURAL DRAINAGE CATHETER Left 11/26/2019   Procedure: REMOVAL OF PLEURAL DRAINAGE CATHETER;  Surgeon: Grace Isaac, MD;  Location: [place];  Service: Thoracic;  Laterality: Left;  . SPINE SURGERY    . TALC PLEURODESIS Left 11/13/2019    Procedure: possible TALC PLEURADESIS;  Surgeon: Grace Isaac, MD;  Location: [place];  Service: Thoracic;  Laterality: Left;  . TOTAL KNEE ARTHROPLASTY  02/01/2012   Procedure: TOTAL KNEE ARTHROPLASTY;  Surgeon: Sydnee Cabal, MD;  Location: [facility];  Service: Orthopedics;  Laterality: Left;  Marland Kitchen VIDEO ASSISTED THORACOSCOPY Left 11/13/2019   Procedure: VIDEO ASSISTED THORACOSCOPY;  Surgeon: Grace Isaac, MD;  Location: [place];  Service: Thoracic;  Laterality: Left;  Marland Kitchen VIDEO BRONCHOSCOPY N/A 11/13/2019   Procedure: VIDEO BRONCHOSCOPY;  Surgeon: Grace Isaac, MD;  Location: [facility];  Service: Thoracic;  Laterality: N/A;    Current Medications: Current Meds  Medication Sig  . acetaminophen (TYLENOL) 500 MG tablet Take 2 tablets (1,000 mg total) by mouth every 6 (six) hours as needed for mild pain or fever.  Marland Kitchen dexamethasone (DECADRON) 4 MG tablet 1 tablet p.o. twice daily the day before, day of and day after chemotherapy every 3 weeks  . fluconazole (DIFLUCAN) 100 MG tablet TAKE 1 TABLET BY MOUTH EVERY DAY  . folic acid (FOLVITE) 1 MG tablet TAKE 1 TABLET BY MOUTH EVERY DAY  . lidocaine-prilocaine (EMLA) cream Apply to Port-A-Cath site 30-60-minute before treatment.  Marland Kitchen losartan-hydrochlorothiazide (HYZAAR) 100-12.5 MG tablet Take 1 tablet by mouth daily.  . metFORMIN (GLUCOPHAGE) 500 MG tablet Take 1 tablet (500 mg total) by mouth 2 (two) times daily with a meal.  . Misc Natural Products (NEURIVA PO) Take 1 capsule by mouth daily.  . ondansetron (ZOFRAN ODT) 4 MG disintegrating tablet Take 1 tablet (4 mg total) by mouth every 8 (eight) hours as needed for nausea or vomiting. Starting 3 days after chemotherapy  . prochlorperazine (COMPAZINE) 10 MG tablet Take 1 tablet (10 mg total) by mouth every 6 (six) hours as needed for nausea or vomiting.  . [DISCONTINUED] rosuvastatin (CRESTOR) 10 MG tablet Take 1 tablet (10 mg total) by mouth daily.     Allergies:    Patient has no known  allergies.   Social History: Social History   Socioeconomic History  . Marital status: Divorced    Spouse name: Not on file  . Number of children: 2  . Years of education: 90  . Highest education level: Not on file  Occupational History  . 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   Tobacco Use  . Smoking status: Former Smoker    Years: 15.00    Quit date: 02/15/1981    Years since quitting: 39.1  . Smokeless tobacco: Former Systems developer    Types: [place] date: 12/2018  Vaping Use  . Vaping Use: Never used  Substance and Sexual Activity  . Alcohol use: Yes    Alcohol/week:  3.0 standard drinks    Types: 3 Shots of liquor per week  . Drug use: No  . Sexual activity: Not on file  Other Topics Concern  . Not on file  Social History Narrative  . Not on file   Social Determinants of Health   Financial Resource Strain: Not on file  Food Insecurity: Not on file  Transportation Needs: Not on file  Physical Activity: Not on file  Stress: Not on file  Social Connections: Not on file     Family History: The patient's family history includes Alzheimer's disease in his brother and father; Cancer in his sister; Heart disease in his brother and sister; Hypertension in his sister. There is no history of Colon cancer, Colon polyps, Esophageal cancer, Rectal cancer, or Stomach cancer.  ROS:   All other ROS reviewed and negative. Pertinent positives noted in the HPI.     EKGs/Labs/Other Studies Reviewed:   The following studies were personally reviewed by me today:  EKG:  EKG is ordered today.  The ekg ordered today demonstrates normal sinus rhythm heart rate 86, incomplete right bundle branch block noted, and was personally reviewed by me.   TTE 10/30/2019 1. Left ventricular ejection fraction, by estimation, is 60 to 65%. The  left ventricle has normal function. The left ventricle has no regional  wall motion abnormalities. There is mild concentric left ventricular   hypertrophy. Left ventricular diastolic  parameters are consistent with Grade I diastolic dysfunction (impaired  relaxation).  2. Right ventricular systolic function was not well visualized. The right  ventricular size is not well visualized. Tricuspid regurgitation signal is  inadequate for assessing PA pressure.  3. Large pleural effusion in the left lateral region.  4. The mitral valve is normal in structure. No evidence of mitral valve  regurgitation. No evidence of mitral stenosis.  5. The aortic valve was not well visualized. Aortic valve regurgitation  is not visualized.  6. The inferior vena cava is normal in size with greater than 50%  respiratory variability, suggesting right atrial pressure of 3 mmHg.   Recent Labs: 10/29/2019: B Natriuretic Peptide 20.4 10/30/2019: TSH 8.320 03/20/2020: ALT 24; BUN 22; Creatinine 1.07; Hemoglobin 11.0; Platelet Count 219; Potassium 4.1; Sodium 137   Recent Lipid Panel    Component Value Date/Time   CHOL 184 09/24/2019 0832   TRIG 94 09/24/2019 0832   HDL 40 09/24/2019 0832   CHOLHDL 4.6 09/24/2019 0832   CHOLHDL 4.1 11/20/2012 1435   VLDL 18 11/20/2012 1435   LDLCALC 127 (H) 09/24/2019 0832    Physical Exam:   VS:  BP 128/72   Pulse 86   Ht 5\' 8"  (1.727 m)   Wt 196 lb 9.6 oz (89.2 kg)   BMI 29.89 kg/m    Wt Readings from Last 3 Encounters:  03/24/20 196 lb 9.6 oz (89.2 kg)  03/20/20 196 lb 11.2 oz (89.2 kg)  03/13/20 192 lb (87.1 kg)    General: Well nourished, well developed, in no acute distress Head: Atraumatic, normal size  Eyes: PEERLA, EOMI  Neck: Supple, no JVD Endocrine: No thryomegaly Cardiac: Normal S1, S2; RRR; no murmurs, rubs, or gallops Lungs: Clear to auscultation bilaterally, no wheezing, rhonchi or rales  Abd: Soft, nontender, no hepatomegaly  Ext: No edema, pulses 2+ Musculoskeletal: No deformities, BUE and BLE strength normal and equal Skin: Warm and dry, no rashes   Neuro: Alert and oriented to  person, place, time, and situation, CNII-XII grossly intact, no focal deficits  Psych: Normal mood and affect   ASSESSMENT:   Jack Haynes is a 80 y.o. male who presents for the following: 1. RBBB   2. Tachycardia   3. Palpitations   4. Mixed hyperlipidemia   5. Atherosclerotic cardiovascular disease     PLAN:   1. RBBB -Full right bundle branch block at primary care physician office but he was tachycardic up to 120 bpm.  EKG today demonstrates incomplete right bundle branch block with heart rate is 86.  Echocardiogram in September 2021 is normal.  Cardiovascular examination normal.  He is not surprising he developed a complete right bundle branch block and he is tachycardic.  This is just simply a nature of conduction disease.  Even his normal echocardiogram and normal cardiovascular termination I see no need for further testing.  2. Tachycardia -Tachycardia intermittently.  Recent TSH 8.3.  I would like to recheck a TSH, free T4 and T3.  I would also like to check an iron profile, B12 and folate level.  He is undergoing chemotherapy and he may have vitamin D deficiencies.  His thyroid function could also explain his intermittent tachycardia.  As well as anemia.  We will proceed with a 7-day Zio patch to exclude any arrhythmias.  He reports no palpitations or alarming symptoms nature is not having any arrhythmias.  Echocardiogram normal recently.  3. Palpitations -She tachycardia as above.  4. Mixed hyperlipidemia 5. Atherosclerotic cardiovascular disease -Diabetic.  Aortic atherosclerosis seen on CT scan.  LDL should be less than 70.  Increase Crestor 20 mg daily.  Disposition: Return in about 2 months (around 05/22/2020).  Medication Adjustments/Labs and Tests Ordered: Current medicines are reviewed at length with the patient today.  Concerns regarding medicines are outlined above.  Orders Placed This Encounter  Procedures  . Iron, TIBC and Ferritin Panel  . TSH  . T4, free  .  T3, free  . B12  . Folate  . LONG TERM MONITOR (3-14 DAYS)  . EKG 12-Lead   Meds ordered this encounter  Medications  . rosuvastatin (CRESTOR) 20 MG tablet    Sig: Take 1 tablet (20 mg total) by mouth daily.    Dispense:  90 tablet    Refill:  1    Patient Instructions  Medication Instructions:  Increase Crestor 20 mg daily   *If you need a refill on your cardiac medications before your next appointment, please call your pharmacy*   Lab Work: Iron Profile, TSH, T3, T4, b12, folate  If you have labs (blood work) drawn today and your tests are completely normal, you will receive your results only by: Marland Kitchen MyChart Message (if you have MyChart) OR . A paper copy in the mail If you have any lab test that is abnormal or we need to change your treatment, we will call you to review the results.   Testing/Procedures: Bryn Gulling- Long Term Monitor Instructions   Your physician has requested you wear your ZIO patch monitor____7___days.   This is a single patch monitor.  [employer] supplies one patch monitor per enrollment.  Additional stickers are not available.   Please do not apply patch if you will be having a Nuclear Stress Test, Echocardiogram, Cardiac CT, MRI, or Chest Xray during the time frame you would be wearing the monitor. The patch cannot be worn during these tests.  You cannot remove and re-apply the ZIO XT patch monitor.   Your ZIO patch monitor will be sent USPS Priority mail from [facility] directly  to your home address. The monitor may also be mailed to a PO BOX if home delivery is not available.   It may take 3-5 days to receive your monitor after you have been enrolled.   Once you have received you monitor, please review enclosed instructions.  Your monitor has already been registered assigning a specific monitor serial # to you.   Applying the monitor   Shave hair from upper left chest.   Hold abrader disc by orange tab.  Rub abrader in 40 strokes over left  upper chest as indicated in your monitor instructions.   Clean area with 4 enclosed alcohol pads .  Use all pads to assure are is cleaned thoroughly.  Let dry.   Apply patch as indicated in monitor instructions.  Patch will be place under collarbone on left side of chest with arrow pointing upward.   Rub patch adhesive wings for 2 minutes.Remove white label marked "1".  Remove white label marked "2".  Rub patch adhesive wings for 2 additional minutes.   While looking in a mirror, press and release button in center of patch.  A small green light will flash 3-4 times .  This will be your only indicator the monitor has been turned on.     Do not shower for the first 24 hours.  You may shower after the first 24 hours.   Press button if you feel a symptom. You will hear a small click.  Record Date, Time and Symptom in the Patient Log Book.   When you are ready to remove patch, follow instructions on last 2 pages of Patient Log Book.  Stick patch monitor onto last page of Patient Log Book.   Place Patient Log Book in [place] box.  Use locking tab on box and tape box closed securely.  The [employer] and [employer] has [employer] on it.  Please place in mailbox as soon as possible.  Your physician should have your test results approximately 7 days after the monitor has been mailed back to [facility].   Call [place] at [phone] if you have questions regarding your ZIO XT patch monitor.  Call them immediately if you see an orange light blinking on your monitor.   If your monitor falls off in less than 4 days contact our Monitor department at [phone].  If your monitor becomes loose or falls off after 4 days call [employer] at [phone] for suggestions on securing your monitor.     Follow-Up: At [facility], you and your health needs are our priority.  As part of our continuing mission to provide you with exceptional heart care, we have created designated Provider  Care Teams.  These Care Teams include your primary Cardiologist (physician) and Advanced Practice Providers (APPs -  Physician Assistants and Nurse Practitioners) who all work together to provide you with the care you need, when you need it.  We recommend signing up for the patient portal called "MyChart".  Sign up information is provided on this After Visit Summary.  MyChart is used to connect with patients for Virtual Visits (Telemedicine).  Patients are able to view lab/test results, encounter notes, upcoming appointments, etc.  Non-urgent messages can be sent to your provider as well.   To learn more about what you can do with MyChart, go to [email or web address].    Your next appointment:   2 month(s)  The format for your next appointment:   In Person  Provider:   Eleonore Chiquito,  MD         Signed, Addison Naegeli. Audie Box, [place]  [address], [place], [place] [address] [phone]  03/24/2020 9:22 AM

## 2020-03-24 ENCOUNTER — Ambulatory Visit (INDEPENDENT_AMBULATORY_CARE_PROVIDER_SITE_OTHER): Payer: PPO

## 2020-03-24 ENCOUNTER — Encounter: Payer: Self-pay | Admitting: Cardiovascular Disease

## 2020-03-24 ENCOUNTER — Telehealth: Payer: Self-pay | Admitting: Internal Medicine

## 2020-03-24 ENCOUNTER — Ambulatory Visit: Payer: PPO | Admitting: Cardiovascular Disease

## 2020-03-24 ENCOUNTER — Encounter: Payer: Self-pay | Admitting: *Deleted

## 2020-03-24 ENCOUNTER — Other Ambulatory Visit: Payer: Self-pay

## 2020-03-24 VITALS — BP 128/72 | HR 86 | Ht 68.0 in | Wt 196.6 lb

## 2020-03-24 DIAGNOSIS — I2510 Atherosclerotic heart disease of native coronary artery without angina pectoris: Secondary | ICD-10-CM

## 2020-03-24 DIAGNOSIS — E782 Mixed hyperlipidemia: Secondary | ICD-10-CM | POA: Diagnosis not present

## 2020-03-24 DIAGNOSIS — R000 Tachycardia, unspecified: Secondary | ICD-10-CM

## 2020-03-24 DIAGNOSIS — R002 Palpitations: Secondary | ICD-10-CM

## 2020-03-24 DIAGNOSIS — I4510 Unspecified right bundle-branch block: Secondary | ICD-10-CM

## 2020-03-24 LAB — T4, FREE: Free T4: 1.24 ng/dL (ref 0.82–1.77)

## 2020-03-24 LAB — IRON,TIBC AND FERRITIN PANEL
Ferritin: 605 ng/mL — ABNORMAL HIGH (ref 30–400)
Iron Saturation: >94 % (ref 15–55)
Iron: 271 ug/dL (ref 38–169)
Total Iron Binding Capacity: <288 ug/dL (ref 250–450)
UIBC: <17 ug/dL — ABNORMAL LOW (ref 111–343)

## 2020-03-24 LAB — T3, FREE: T3, Free: 3.6 pg/mL (ref 2.0–4.4)

## 2020-03-24 LAB — VITAMIN B12: Vitamin B-12: 718 pg/mL (ref 232–1245)

## 2020-03-24 LAB — TSH: TSH: 7.48 u[IU]/mL — ABNORMAL HIGH (ref 0.450–4.500)

## 2020-03-24 LAB — FOLATE: Folate: >20 ng/mL (ref >3.0)

## 2020-03-24 MED ORDER — ROSUVASTATIN CALCIUM 20 MG PO TABS: 20.0000 mg | ORAL_TABLET | Freq: Every day | ORAL | 1 refills | Status: DC

## 2020-03-24 NOTE — Telephone Encounter (Signed)
Per 2/3 los, next appt already scheduled

## 2020-03-24 NOTE — Patient Instructions (Addendum)
Medication Instructions:  Increase Crestor 20 mg daily   *If you need a refill on your cardiac medications before your next appointment, please call your pharmacy*   Lab Work: Iron Profile, TSH, T3, T4, b12, folate  If you have labs (blood work) drawn today and your tests are completely normal, you will receive your results only by: Marland Kitchen MyChart Message (if you have MyChart) OR . A paper copy in the mail If you have any lab test that is abnormal or we need to change your treatment, we will call you to review the results.   Testing/Procedures: Bryn Gulling- Long Term Monitor Instructions   Your physician has requested you wear your ZIO patch monitor____7___days.   This is a single patch monitor.  [employer] supplies one patch monitor per enrollment.  Additional stickers are not available.   Please do not apply patch if you will be having a Nuclear Stress Test, Echocardiogram, Cardiac CT, MRI, or Chest Xray during the time frame you would be wearing the monitor. The patch cannot be worn during these tests.  You cannot remove and re-apply the ZIO XT patch monitor.   Your ZIO patch monitor will be sent USPS Priority mail from [facility] directly to your home address. The monitor may also be mailed to a PO BOX if home delivery is not available.   It may take 3-5 days to receive your monitor after you have been enrolled.   Once you have received you monitor, please review enclosed instructions.  Your monitor has already been registered assigning a specific monitor serial # to you.   Applying the monitor   Shave hair from upper left chest.   Hold abrader disc by orange tab.  Rub abrader in 40 strokes over left upper chest as indicated in your monitor instructions.   Clean area with 4 enclosed alcohol pads .  Use all pads to assure are is cleaned thoroughly.  Let dry.   Apply patch as indicated in monitor instructions.  Patch will be place under collarbone on left side of chest with arrow  pointing upward.   Rub patch adhesive wings for 2 minutes.Remove white label marked "1".  Remove white label marked "2".  Rub patch adhesive wings for 2 additional minutes.   While looking in a mirror, press and release button in center of patch.  A small green light will flash 3-4 times .  This will be your only indicator the monitor has been turned on.     Do not shower for the first 24 hours.  You may shower after the first 24 hours.   Press button if you feel a symptom. You will hear a small click.  Record Date, Time and Symptom in the Patient Log Book.   When you are ready to remove patch, follow instructions on last 2 pages of Patient Log Book.  Stick patch monitor onto last page of Patient Log Book.   Place Patient Log Book in [place] box.  Use locking tab on box and tape box closed securely.  The [employer] and [employer] has [employer] on it.  Please place in mailbox as soon as possible.  Your physician should have your test results approximately 7 days after the monitor has been mailed back to [facility].   Call [place] at [phone] if you have questions regarding your ZIO XT patch monitor.  Call them immediately if you see an orange light blinking on your monitor.   If your monitor  falls off in less than 4 days contact our Monitor department at [phone].  If your monitor becomes loose or falls off after 4 days call [employer] at [phone] for suggestions on securing your monitor.     Follow-Up: At [facility], you and your health needs are our priority.  As part of our continuing mission to provide you with exceptional heart care, we have created designated Provider Care Teams.  These Care Teams include your primary Cardiologist (physician) and Advanced Practice Providers (APPs -  Physician Assistants and Nurse Practitioners) who all work together to provide you with the care you need, when you need it.  We recommend signing up for the patient  portal called "MyChart".  Sign up information is provided on this After Visit Summary.  MyChart is used to connect with patients for Virtual Visits (Telemedicine).  Patients are able to view lab/test results, encounter notes, upcoming appointments, etc.  Non-urgent messages can be sent to your provider as well.   To learn more about what you can do with MyChart, go to [email or web address].    Your next appointment:   2 month(s)  The format for your next appointment:   In Person  Provider:   Eleonore Chiquito, MD

## 2020-03-24 NOTE — Progress Notes (Signed)
Patient ID: Jack Haynes, male   DOB: 02-02-1941, 80 y.o.   MRN: [id number] Patient enrolled for [employer] to ship a 7 day ZIO XT long term holter monitor to his home.

## 2020-03-27 ENCOUNTER — Telehealth: Payer: Self-pay | Admitting: Cardiovascular Disease

## 2020-03-27 ENCOUNTER — Inpatient Hospital Stay: Payer: PPO

## 2020-03-27 ENCOUNTER — Other Ambulatory Visit: Payer: Self-pay

## 2020-03-27 DIAGNOSIS — Z95828 Presence of other vascular implants and grafts: Secondary | ICD-10-CM

## 2020-03-27 DIAGNOSIS — C457 Mesothelioma of other sites: Secondary | ICD-10-CM

## 2020-03-27 DIAGNOSIS — Z5111 Encounter for antineoplastic chemotherapy: Secondary | ICD-10-CM | POA: Diagnosis not present

## 2020-03-27 LAB — CBC WITH DIFFERENTIAL (CANCER CENTER ONLY)
Abs Immature Granulocytes: 0.01 10*3/uL (ref 0.00–0.07)
Basophils Absolute: 0 10*3/uL (ref 0.0–0.1)
Basophils Relative: 0 %
Eosinophils Absolute: 0.1 10*3/uL (ref 0.0–0.5)
Eosinophils Relative: 2 %
HCT: 33 % — ABNORMAL LOW (ref 39.0–52.0)
Hemoglobin: 11.3 g/dL — ABNORMAL LOW (ref 13.0–17.0)
Immature Granulocytes: 0 %
Lymphocytes Relative: 49 %
Lymphs Abs: 1.3 10*3/uL (ref 0.7–4.0)
MCH: 31.7 pg (ref 26.0–34.0)
MCHC: 34.2 g/dL (ref 30.0–36.0)
MCV: 92.7 fL (ref 80.0–100.0)
Monocytes Absolute: 0.3 10*3/uL (ref 0.1–1.0)
Monocytes Relative: 10 %
Neutro Abs: 1 10*3/uL — ABNORMAL LOW (ref 1.7–7.7)
Neutrophils Relative %: 39 %
Platelet Count: 95 10*3/uL — ABNORMAL LOW (ref 150–400)
RBC: 3.56 MIL/uL — ABNORMAL LOW (ref 4.22–5.81)
RDW: 14.6 % (ref 11.5–15.5)
WBC Count: 2.7 10*3/uL — ABNORMAL LOW (ref 4.0–10.5)
nRBC: 0 % (ref 0.0–0.2)

## 2020-03-27 LAB — CMP (CANCER CENTER ONLY)
ALT: 19 U/L (ref 0–44)
AST: 17 U/L (ref 15–41)
Albumin: 3.7 g/dL (ref 3.5–5.0)
Alkaline Phosphatase: 66 U/L (ref 38–126)
Anion gap: 8 (ref 5–15)
BUN: 22 mg/dL (ref 8–23)
CO2: 26 mmol/L (ref 22–32)
Calcium: 9 mg/dL (ref 8.9–10.3)
Chloride: 102 mmol/L (ref 98–111)
Creatinine: 0.9 mg/dL (ref 0.61–1.24)
GFR, Estimated: >60 mL/min (ref >60)
Glucose, Bld: 140 mg/dL — ABNORMAL HIGH (ref 70–99)
Potassium: 4 mmol/L (ref 3.5–5.1)
Sodium: 136 mmol/L (ref 135–145)
Total Bilirubin: 0.4 mg/dL (ref 0.3–1.2)
Total Protein: 6.5 g/dL (ref 6.5–8.1)

## 2020-03-27 MED ORDER — HEPARIN SOD (PORK) LOCK FLUSH 100 UNIT/ML IV SOLN
500.0000 [IU] | Freq: Once | INTRAVENOUS | Status: AC
  Administered 2020-03-27: 500 [IU]
  Filled 2020-03-27: qty 5

## 2020-03-27 MED ORDER — SODIUM CHLORIDE 0.9% FLUSH
10.0000 mL | Freq: Once | INTRAVENOUS | Status: AC
  Administered 2020-03-27: 10 mL
  Filled 2020-03-27: qty 10

## 2020-03-27 MED ORDER — ALTEPLASE 2 MG IJ SOLR
INTRAMUSCULAR | Status: AC
  Filled 2020-03-27: qty 2

## 2020-03-27 MED ORDER — ALTEPLASE 2 MG IJ SOLR
2.0000 mg | Freq: Once | INTRAMUSCULAR | Status: AC
  Administered 2020-03-27: 2 mg
  Filled 2020-03-27: qty 2

## 2020-03-27 NOTE — Telephone Encounter (Signed)
Pt wife called in stated that they rec'd monitor but pt has a CT scan on Monday.  She stated they can put it on after that but she notice that it looks like where the sticky part needs to go, the pt has a portacath right in the same spot.  She would like to talk with some to find out what she needs to do about that ?     Best number [phone]

## 2020-03-27 NOTE — Telephone Encounter (Addendum)
Jack Haynes put on phone who gave verbal permission to speak with fiance, [name], regarding ZIO XT patch placement. [name] will apply ZIO XT patch Monday after CT scan.  ZIO XT patch will be placed more vertically instead of diagonally to avoid port-a-cath.

## 2020-03-31 ENCOUNTER — Ambulatory Visit (HOSPITAL_COMMUNITY)
Admission: RE | Admit: 2020-03-31 | Discharge: 2020-03-31 | Disposition: A | Discharge status: Home / Self Care | Payer: PPO | Source: Ambulatory Visit | Attending: Internal Medicine | Admitting: Internal Medicine

## 2020-03-31 ENCOUNTER — Other Ambulatory Visit: Payer: Self-pay

## 2020-03-31 DIAGNOSIS — C457 Mesothelioma of other sites: Secondary | ICD-10-CM | POA: Diagnosis not present

## 2020-03-31 DIAGNOSIS — C450 Mesothelioma of pleura: Secondary | ICD-10-CM | POA: Diagnosis not present

## 2020-03-31 DIAGNOSIS — I2510 Atherosclerotic heart disease of native coronary artery without angina pectoris: Secondary | ICD-10-CM | POA: Diagnosis not present

## 2020-03-31 DIAGNOSIS — K449 Diaphragmatic hernia without obstruction or gangrene: Secondary | ICD-10-CM | POA: Diagnosis not present

## 2020-03-31 DIAGNOSIS — J929 Pleural plaque without asbestos: Secondary | ICD-10-CM | POA: Diagnosis not present

## 2020-03-31 MED ORDER — IOHEXOL 300 MG/ML  SOLN
75.0000 mL | Freq: Once | INTRAMUSCULAR | Status: AC | PRN
  Administered 2020-03-31: 75 mL via INTRAVENOUS

## 2020-04-03 ENCOUNTER — Inpatient Hospital Stay: Payer: PPO

## 2020-04-03 ENCOUNTER — Other Ambulatory Visit: Payer: Self-pay

## 2020-04-03 DIAGNOSIS — C457 Mesothelioma of other sites: Secondary | ICD-10-CM

## 2020-04-03 DIAGNOSIS — Z5111 Encounter for antineoplastic chemotherapy: Secondary | ICD-10-CM | POA: Diagnosis not present

## 2020-04-03 DIAGNOSIS — Z95828 Presence of other vascular implants and grafts: Secondary | ICD-10-CM

## 2020-04-03 LAB — CMP (CANCER CENTER ONLY)
ALT: 27 U/L (ref 0–44)
AST: 23 U/L (ref 15–41)
Albumin: 3.7 g/dL (ref 3.5–5.0)
Alkaline Phosphatase: 72 U/L (ref 38–126)
Anion gap: 9 (ref 5–15)
BUN: 15 mg/dL (ref 8–23)
CO2: 23 mmol/L (ref 22–32)
Calcium: 8.4 mg/dL — ABNORMAL LOW (ref 8.9–10.3)
Chloride: 106 mmol/L (ref 98–111)
Creatinine: 0.98 mg/dL (ref 0.61–1.24)
GFR, Estimated: >60 mL/min (ref >60)
Glucose, Bld: 186 mg/dL — ABNORMAL HIGH (ref 70–99)
Potassium: 3.7 mmol/L (ref 3.5–5.1)
Sodium: 138 mmol/L (ref 135–145)
Total Bilirubin: 0.3 mg/dL (ref 0.3–1.2)
Total Protein: 6.7 g/dL (ref 6.5–8.1)

## 2020-04-03 LAB — CBC WITH DIFFERENTIAL (CANCER CENTER ONLY)
Abs Immature Granulocytes: 0.01 10*3/uL (ref 0.00–0.07)
Basophils Absolute: 0 10*3/uL (ref 0.0–0.1)
Basophils Relative: 0 %
Eosinophils Absolute: 0 10*3/uL (ref 0.0–0.5)
Eosinophils Relative: 1 %
HCT: 30.6 % — ABNORMAL LOW (ref 39.0–52.0)
Hemoglobin: 10.6 g/dL — ABNORMAL LOW (ref 13.0–17.0)
Immature Granulocytes: 0 %
Lymphocytes Relative: 32 %
Lymphs Abs: 1.3 10*3/uL (ref 0.7–4.0)
MCH: 32.9 pg (ref 26.0–34.0)
MCHC: 34.6 g/dL (ref 30.0–36.0)
MCV: 95 fL (ref 80.0–100.0)
Monocytes Absolute: 0.7 10*3/uL (ref 0.1–1.0)
Monocytes Relative: 17 %
Neutro Abs: 2 10*3/uL (ref 1.7–7.7)
Neutrophils Relative %: 50 %
Platelet Count: 81 10*3/uL — ABNORMAL LOW (ref 150–400)
RBC: 3.22 MIL/uL — ABNORMAL LOW (ref 4.22–5.81)
RDW: 15.2 % (ref 11.5–15.5)
WBC Count: 4 10*3/uL (ref 4.0–10.5)
nRBC: 0 % (ref 0.0–0.2)

## 2020-04-03 LAB — TOTAL PROTEIN, URINE DIPSTICK: Protein, ur: 30 mg/dL — AB

## 2020-04-03 MED ORDER — SODIUM CHLORIDE 0.9% FLUSH
10.0000 mL | Freq: Once | INTRAVENOUS | Status: AC
  Administered 2020-04-03: 10 mL
  Filled 2020-04-03: qty 10

## 2020-04-03 MED ORDER — HEPARIN SOD (PORK) LOCK FLUSH 100 UNIT/ML IV SOLN
500.0000 [IU] | Freq: Once | INTRAVENOUS | Status: AC
  Administered 2020-04-03: 500 [IU]
  Filled 2020-04-03: qty 5

## 2020-04-04 ENCOUNTER — Other Ambulatory Visit: Payer: Self-pay | Admitting: Cardiothoracic Surgery

## 2020-04-04 DIAGNOSIS — J90 Pleural effusion, not elsewhere classified: Secondary | ICD-10-CM

## 2020-04-10 ENCOUNTER — Inpatient Hospital Stay: Payer: PPO | Admitting: Internal Medicine

## 2020-04-10 ENCOUNTER — Other Ambulatory Visit: Payer: Self-pay

## 2020-04-10 ENCOUNTER — Inpatient Hospital Stay: Payer: PPO

## 2020-04-10 VITALS — BP 146/76 | HR 93 | Temp 97.6°F | Resp 15 | Ht 68.0 in | Wt 202.6 lb

## 2020-04-10 DIAGNOSIS — C457 Mesothelioma of other sites: Secondary | ICD-10-CM

## 2020-04-10 DIAGNOSIS — Z5111 Encounter for antineoplastic chemotherapy: Secondary | ICD-10-CM | POA: Diagnosis not present

## 2020-04-10 DIAGNOSIS — Z95828 Presence of other vascular implants and grafts: Secondary | ICD-10-CM

## 2020-04-10 LAB — CBC WITH DIFFERENTIAL (CANCER CENTER ONLY)
Abs Immature Granulocytes: 0.05 10*3/uL (ref 0.00–0.07)
Basophils Absolute: 0 10*3/uL (ref 0.0–0.1)
Basophils Relative: 0 %
Eosinophils Absolute: 0 10*3/uL (ref 0.0–0.5)
Eosinophils Relative: 0 %
HCT: 30.1 % — ABNORMAL LOW (ref 39.0–52.0)
Hemoglobin: 10.6 g/dL — ABNORMAL LOW (ref 13.0–17.0)
Immature Granulocytes: 1 %
Lymphocytes Relative: 13 %
Lymphs Abs: 0.7 10*3/uL (ref 0.7–4.0)
MCH: 33.5 pg (ref 26.0–34.0)
MCHC: 35.2 g/dL (ref 30.0–36.0)
MCV: 95.3 fL (ref 80.0–100.0)
Monocytes Absolute: 0.4 10*3/uL (ref 0.1–1.0)
Monocytes Relative: 7 %
Neutro Abs: 4.6 10*3/uL (ref 1.7–7.7)
Neutrophils Relative %: 79 %
Platelet Count: 226 10*3/uL (ref 150–400)
RBC: 3.16 MIL/uL — ABNORMAL LOW (ref 4.22–5.81)
RDW: 15.2 % (ref 11.5–15.5)
WBC Count: 5.8 10*3/uL (ref 4.0–10.5)
nRBC: 0 % (ref 0.0–0.2)

## 2020-04-10 LAB — CMP (CANCER CENTER ONLY)
ALT: 21 U/L (ref 0–44)
AST: 16 U/L (ref 15–41)
Albumin: 3.7 g/dL (ref 3.5–5.0)
Alkaline Phosphatase: 67 U/L (ref 38–126)
Anion gap: 11 (ref 5–15)
BUN: 16 mg/dL (ref 8–23)
CO2: 20 mmol/L — ABNORMAL LOW (ref 22–32)
Calcium: 8.2 mg/dL — ABNORMAL LOW (ref 8.9–10.3)
Chloride: 102 mmol/L (ref 98–111)
Creatinine: 1.06 mg/dL (ref 0.61–1.24)
GFR, Estimated: >60 mL/min (ref >60)
Glucose, Bld: 319 mg/dL — ABNORMAL HIGH (ref 70–99)
Potassium: 4.1 mmol/L (ref 3.5–5.1)
Sodium: 133 mmol/L — ABNORMAL LOW (ref 135–145)
Total Bilirubin: 0.4 mg/dL (ref 0.3–1.2)
Total Protein: 6.9 g/dL (ref 6.5–8.1)

## 2020-04-10 MED ORDER — SODIUM CHLORIDE 0.9% FLUSH
10.0000 mL | INTRAVENOUS | Status: DC | PRN
  Administered 2020-04-10: 10 mL
  Filled 2020-04-10: qty 10

## 2020-04-10 MED ORDER — SODIUM CHLORIDE 0.9% FLUSH
10.0000 mL | Freq: Once | INTRAVENOUS | Status: AC
  Administered 2020-04-10: 10 mL
  Filled 2020-04-10: qty 10

## 2020-04-10 MED ORDER — SODIUM CHLORIDE 0.9 % IV SOLN
15.0000 mg/kg | Freq: Once | INTRAVENOUS | Status: AC
  Administered 2020-04-10: 1300 mg via INTRAVENOUS
  Filled 2020-04-10: qty 48

## 2020-04-10 MED ORDER — HEPARIN SOD (PORK) LOCK FLUSH 100 UNIT/ML IV SOLN
500.0000 [IU] | Freq: Once | INTRAVENOUS | Status: AC | PRN
  Administered 2020-04-10: 500 [IU]
  Filled 2020-04-10: qty 5

## 2020-04-10 MED ORDER — SODIUM CHLORIDE 0.9 % IV SOLN
Freq: Once | INTRAVENOUS | Status: AC
  Filled 2020-04-10: qty 250

## 2020-04-10 NOTE — Patient Instructions (Signed)
Implanted Port Insertion, Care After This sheet gives you information about how to care for yourself after your procedure. Your health care provider may also give you more specific instructions. If you have problems or questions, contact your health care provider. What can I expect after the procedure? After the procedure, it is common to have:  Discomfort at the port insertion site.  Bruising on the skin over the port. This should improve over 3-4 days. Follow these instructions at home: Port care  After your port is placed, you will get a manufacturer's information card. The card has information about your port. Keep this card with you at all times.  Take care of the port as told by your health care provider. Ask your health care provider if you or a family member can get training for taking care of the port at home. A home health care nurse may also take care of the port.  Make sure to remember what type of port you have. Incision care  Follow instructions from your health care provider about how to take care of your port insertion site. Make sure you: ? Wash your hands with soap and water before and after you change your bandage (dressing). If soap and water are not available, use hand sanitizer. ? Change your dressing as told by your health care provider. ? Leave stitches (sutures), skin glue, or adhesive strips in place. These skin closures may need to stay in place for 2 weeks or longer. If adhesive strip edges start to loosen and curl up, you may trim the loose edges. Do not remove adhesive strips completely unless your health care provider tells you to do that.  Check your port insertion site every day for signs of infection. Check for: ? Redness, swelling, or pain. ? Fluid or blood. ? Warmth. ? Pus or a bad smell.      Activity  Return to your normal activities as told by your health care provider. Ask your health care provider what activities are safe for you.  Do not  lift anything that is heavier than 10 lb (4.5 kg), or the limit that you are told, until your health care provider says that it is safe. General instructions  Take over-the-counter and prescription medicines only as told by your health care provider.  Do not take baths, swim, or use a hot tub until your health care provider approves. Ask your health care provider if you may take showers. You may only be allowed to take sponge baths.  Do not drive for 24 hours if you were given a sedative during your procedure.  Wear a medical alert bracelet in case of an emergency. This will tell any health care providers that you have a port.  Keep all follow-up visits as told by your health care provider. This is important. Contact a health care provider if:  You cannot flush your port with saline as directed, or you cannot draw blood from the port.  You have a fever or chills.  You have redness, swelling, or pain around your port insertion site.  You have fluid or blood coming from your port insertion site.  Your port insertion site feels warm to the touch.  You have pus or a bad smell coming from the port insertion site. Get help right away if:  You have chest pain or shortness of breath.  You have bleeding from your port that you cannot control. Summary  Take care of the port as told by your   health care provider. Keep the manufacturer's information card with you at all times.  Change your dressing as told by your health care provider.  Contact a health care provider if you have a fever or chills or if you have redness, swelling, or pain around your port insertion site.  Keep all follow-up visits as told by your health care provider. This information is not intended to replace advice given to you by your health care provider. Make sure you discuss any questions you have with your health care provider. Document Revised: 08/30/2017 Document Reviewed: 08/30/2017 Elsevier Patient Education   2021 [employer] Inc.  

## 2020-04-10 NOTE — Patient Instructions (Signed)
[facility] Discharge Instructions for Patients Receiving Chemotherapy  Today you received the following chemotherapy agents avastin  To help prevent nausea and vomiting after your treatment, we encourage you to take your nausea medication as directed   If you develop nausea and vomiting that is not controlled by your nausea medication, call the clinic.   BELOW ARE SYMPTOMS THAT SHOULD BE REPORTED IMMEDIATELY:  *FEVER GREATER THAN 100.5 F  *CHILLS WITH OR WITHOUT FEVER  NAUSEA AND VOMITING THAT IS NOT CONTROLLED WITH YOUR NAUSEA MEDICATION  *UNUSUAL SHORTNESS OF BREATH  *UNUSUAL BRUISING OR BLEEDING  TENDERNESS IN MOUTH AND THROAT WITH OR WITHOUT PRESENCE OF ULCERS  *URINARY PROBLEMS  *BOWEL PROBLEMS  UNUSUAL RASH Items with * indicate a potential emergency and should be followed up as soon as possible.  Feel free to call the clinic should you have any questions or concerns. The clinic phone number is [phone].  Please show the [facility] at check-in to the Emergency Department and triage nurse.

## 2020-04-10 NOTE — Progress Notes (Signed)
[facility] Telephone:([phone]   Fax:([phone]) [phone]  OFFICE PROGRESS NOTE  [name], MD [place] [address]  DIAGNOSIS: Stage I/II malignant pleural mesothelioma involving the left hemithorax diagnosed in September 2021.  PRIOR THERAPY: None.  CURRENT THERAPY:  Systemic chemotherapy with carboplatin for AUC of 5, Alimta 500 mg/M2 and Avastin 15 mg/KG every 3 weeks.  Status post 6 cycles.  Starting from cycle #7 the patient will be on treatment with maintenance Avastin 15 mg/KG every 3 weeks.  INTERVAL HISTORY: Jack Haynes 80 y.o. male returns to the clinic today for follow-up visit accompanied by his wife.  The patient is feeling fine today with no concerning complaints.  He tolerated the last cycle of his treatment fairly well except for fatigue.  He has some bloating and he is currently on Gas-X.  He denied having any current chest pain, shortness of breath, cough or hemoptysis.  He denied having any fever or chills.  The patient has no nausea, vomiting, diarrhea or constipation.  He has no headache or visual changes.  He has no weight loss or night sweats.  He is here today for evaluation with repeat CT scan of the chest for restaging of his disease before starting cycle #7.   MEDICAL HISTORY: Past Medical History:  Diagnosis Date  . Arthritis   . Cancer (HCC)    hx of prostatae cancer  . Dyspnea    until chest tube placed  . Hypertension   . Mesothelioma [facility])     ALLERGIES:  has No Known Allergies.  MEDICATIONS:  Current Outpatient Medications  Medication Sig Dispense Refill  . acetaminophen (TYLENOL) 500 MG tablet Take 2 tablets (1,000 mg total) by mouth every 6 (six) hours as needed for mild pain or fever. 30 tablet 0  . dexamethasone (DECADRON) 4 MG tablet 1 tablet p.o. twice daily the day before, day of and day after chemotherapy every 3 weeks 40 tablet 1  . fluconazole (DIFLUCAN) 100 MG tablet TAKE 1 TABLET BY  MOUTH EVERY DAY 7 tablet 0  . folic acid (FOLVITE) 1 MG tablet TAKE 1 TABLET BY MOUTH EVERY DAY 90 tablet 1  . lidocaine-prilocaine (EMLA) cream Apply to Port-A-Cath site 30-60-minute before treatment. 30 g 0  . losartan-hydrochlorothiazide (HYZAAR) 100-12.5 MG tablet Take 1 tablet by mouth daily. 90 tablet 3  . metFORMIN (GLUCOPHAGE) 500 MG tablet Take 1 tablet (500 mg total) by mouth 2 (two) times daily with a meal. 180 tablet 3  . Misc Natural Products (NEURIVA PO) Take 1 capsule by mouth daily.    . ondansetron (ZOFRAN ODT) 4 MG disintegrating tablet Take 1 tablet (4 mg total) by mouth every 8 (eight) hours as needed for nausea or vomiting. Starting 3 days after chemotherapy 30 tablet 1  . prochlorperazine (COMPAZINE) 10 MG tablet Take 1 tablet (10 mg total) by mouth every 6 (six) hours as needed for nausea or vomiting. 30 tablet 0  . rosuvastatin (CRESTOR) 20 MG tablet Take 1 tablet (20 mg total) by mouth daily. 90 tablet 1   No current facility-administered medications for this visit.    SURGICAL HISTORY:  Past Surgical History:  Procedure Laterality Date  . APPENDECTOMY    . BACK SURGERY  1998   disectomy  . CHEST TUBE INSERTION Left 11/13/2019   Procedure: INSERTION PLEURAL DRAINAGE CATHETER;  Surgeon: Grace Isaac, MD;  Location: [facility];  Service: Thoracic;  Laterality: Left;  .  EYE SURGERY Left 1965   injury  . JOINT REPLACEMENT Left 2010   left knee replacement  . KNEE SURGERY    . PLEURAL BIOPSY Left 11/13/2019   Procedure: PLEURAL BIOPSY;  Surgeon: Grace Isaac, MD;  Location: [facility];  Service: Thoracic;  Laterality: Left;  . PORTACATH PLACEMENT Left 11/26/2019   Procedure: INSERTION PORT-A-CATH BARD POWERPORT 9.6 FR CATHETER;  Surgeon: Grace Isaac, MD;  Location: [facility];  Service: Thoracic;  Laterality: Left;  . PROSTATE SURGERY    . REMOVAL OF PLEURAL DRAINAGE CATHETER Left 11/26/2019   Procedure: REMOVAL OF PLEURAL DRAINAGE CATHETER;  Surgeon: Grace Isaac, MD;  Location: [facility];  Service: Thoracic;  Laterality: Left;  . SPINE SURGERY    . TALC PLEURODESIS Left 11/13/2019   Procedure: possible TALC PLEURADESIS;  Surgeon: Grace Isaac, MD;  Location: [facility];  Service: Thoracic;  Laterality: Left;  . TOTAL KNEE ARTHROPLASTY  02/01/2012   Procedure: TOTAL KNEE ARTHROPLASTY;  Surgeon: Sydnee Cabal, MD;  Location: [facility];  Service: Orthopedics;  Laterality: Left;  Marland Kitchen VIDEO ASSISTED THORACOSCOPY Left 11/13/2019   Procedure: VIDEO ASSISTED THORACOSCOPY;  Surgeon: Grace Isaac, MD;  Location: [facility];  Service: Thoracic;  Laterality: Left;  Marland Kitchen VIDEO BRONCHOSCOPY N/A 11/13/2019   Procedure: VIDEO BRONCHOSCOPY;  Surgeon: Grace Isaac, MD;  Location[facility] OR;  Service: Thoracic;  Laterality: N/A;    REVIEW OF SYSTEMS:  Constitutional: positive for fatigue Eyes: negative Ears, nose, mouth, throat, and face: negative Respiratory: negative Cardiovascular: negative Gastrointestinal: positive for Bloating Genitourinary:negative Integument/breast: negative Hematologic/lymphatic: negative Musculoskeletal:negative Neurological: negative Behavioral/Psych: negative Endocrine: negative Allergic/Immunologic: negative   PHYSICAL EXAMINATION: General appearance: alert, cooperative, fatigued and no distress Head: Normocephalic, without obvious abnormality, atraumatic Neck: no adenopathy, no JVD, supple, symmetrical, trachea midline and thyroid not enlarged, symmetric, no tenderness/mass/nodules Lymph nodes: Cervical, supraclavicular, and axillary nodes normal. Resp: clear to auscultation bilaterally Back: symmetric, no curvature. ROM normal. No CVA tenderness. Cardio: regular rate and rhythm, S1, S2 normal, no murmur, click, rub or gallop GI: soft, non-tender; bowel sounds normal; no masses,  no organomegaly Extremities: extremities normal, atraumatic, no cyanosis or edema Neurologic: Alert and oriented X 3, normal strength and tone. Normal  symmetric reflexes. Normal coordination and gait  ECOG PERFORMANCE STATUS: 1 - Symptomatic but completely ambulatory  Blood pressure (!) 146/76, pulse 93, temperature 97.6 F (36.4 C), temperature source Tympanic, resp. rate 15, height 5\' 8"  (1.727 m), weight 202 lb 9.6 oz (91.9 kg), SpO2 100 %.  LABORATORY DATA: Lab Results  Component Value Date   WBC 5.8 04/10/2020   HGB 10.6 (L) 04/10/2020   HCT 30.1 (L) 04/10/2020   MCV 95.3 04/10/2020   PLT 226 04/10/2020      Chemistry      Component Value Date/Time   NA 138 04/03/2020 0755   NA 141 09/24/2019 0832   K 3.7 04/03/2020 0755   CL 106 04/03/2020 0755   CO2 23 04/03/2020 0755   BUN 15 04/03/2020 0755   BUN 17 09/24/2019 0832   CREATININE 0.98 04/03/2020 0755   CREATININE 1.01 12/24/2014 0916      Component Value Date/Time   CALCIUM 8.4 (L) 04/03/2020 0755   ALKPHOS 72 04/03/2020 0755   AST 23 04/03/2020 0755   ALT 27 04/03/2020 0755   BILITOT 0.3 04/03/2020 0755       RADIOGRAPHIC STUDIES: CT Chest W Contrast  Result Date: 04/01/2020 CLINICAL DATA:  Restaging malignant mesothelioma of the  left hemithorax. EXAM: CT CHEST WITH CONTRAST TECHNIQUE: Multidetector CT imaging of the chest was performed during intravenous contrast administration. CONTRAST:  86mL OMNIPAQUE IOHEXOL 300 MG/ML  SOLN COMPARISON:  02/05/2020 FINDINGS: Cardiovascular: Heart size is normal. No pericardial effusion. Coronary artery calcifications. Aortic atherosclerosis. Mediastinum/Nodes: Normal appearance of the thyroid gland. The trachea appears patent and is midline. Small hiatal hernia. No axillary, supraclavicular, mediastinal, or hilar adenopathy. Lungs/Pleura: No pleural effusion. Circumferential pleural thickening within the left hemithorax with nodularity is again noted. Within the medial left hemithorax focal pleural nodularity measures 2.8 x 1.4 cm, image 105/2. Unchanged from previous exam. High attenuation material within the thickened pleura  of the left hemithorax is consistent with previous pleurodesis. Mild peripheral interstitial reticulation and parenchymal banding is again noted within the left lung with secondary volume loss. No parenchymal nodule or mass identified at this time. Right lung remains clear. Upper Abdomen: No acute abnormality within the imaged portions of the upper abdomen. Musculoskeletal: Multilevel degenerative disc disease within the thoracic spine. IMPRESSION: 1. Stable CT of the chest. Unchanged appearance of pleural thickening and nodularity within the left hemithorax compatible with known malignant pleural mesothelioma. No signs of metastatic disease. 2. Aortic Atherosclerosis (ICD10-I70.0). Coronary artery calcifications noted. 3. Small hiatal hernia. Aortic Atherosclerosis (ICD10-I70.0). Electronically Signed   By: Kerby Moors M.D.   On: 04/01/2020 08:59    ASSESSMENT AND PLAN: This is a very pleasant 80 years old white male recently diagnosed with malignant pleural mesothelioma involving the left hemithorax. The patient is currently undergoing systemic chemotherapy with carboplatin for AUC of 5, Alimta 500 mg/M2 and Avastin 15 mg/KG every 3 weeks.  Status post 6 cycles.  Starting from cycle #7 the patient will be on maintenance treatment with Avastin 15 mg/KG every 3 weeks. He tolerated the last cycle of his treatment fairly well except for fatigue. The patient had repeat CT scan of the chest performed recently.  I personally and independently reviewed the scan and discussed the results with the patient and his wife. His scan showed no concerning findings for disease progression. I recommended for the patient to continue his current treatment and he will start the first cycle of maintenance treatment with Avastin today. For the bloating, he will continue with Gas-X and laxative. For the hypertension he will continue with his current blood pressure medication and monitor it closely at home. The patient was  advised to call immediately if he has any concerning symptoms in the interval.  The patient voices understanding of current disease status and treatment options and is in agreement with the current care plan.  All questions were answered. The patient knows to call the clinic with any problems, questions or concerns. We can certainly see the patient much sooner if necessary.   Disclaimer: This note was dictated with voice recognition software. Similar sounding words can inadvertently be transcribed and may not be corrected upon review.

## 2020-04-16 DIAGNOSIS — R000 Tachycardia, unspecified: Secondary | ICD-10-CM | POA: Diagnosis not present

## 2020-04-17 ENCOUNTER — Inpatient Hospital Stay: Payer: PPO

## 2020-04-24 ENCOUNTER — Inpatient Hospital Stay: Payer: PPO

## 2020-04-28 ENCOUNTER — Other Ambulatory Visit: Payer: Self-pay | Admitting: Internal Medicine

## 2020-05-01 ENCOUNTER — Other Ambulatory Visit: Payer: Self-pay

## 2020-05-01 ENCOUNTER — Inpatient Hospital Stay: Payer: PPO | Attending: Internal Medicine

## 2020-05-01 ENCOUNTER — Inpatient Hospital Stay: Payer: PPO

## 2020-05-01 ENCOUNTER — Inpatient Hospital Stay: Payer: PPO | Admitting: Internal Medicine

## 2020-05-01 VITALS — BP 140/98 | HR 94 | Temp 97.1°F | Resp 17 | Ht 68.0 in | Wt 200.7 lb

## 2020-05-01 DIAGNOSIS — M129 Arthropathy, unspecified: Secondary | ICD-10-CM | POA: Insufficient documentation

## 2020-05-01 DIAGNOSIS — Z5111 Encounter for antineoplastic chemotherapy: Secondary | ICD-10-CM

## 2020-05-01 DIAGNOSIS — C450 Mesothelioma of pleura: Secondary | ICD-10-CM | POA: Insufficient documentation

## 2020-05-01 DIAGNOSIS — C457 Mesothelioma of other sites: Secondary | ICD-10-CM

## 2020-05-01 DIAGNOSIS — I10 Essential (primary) hypertension: Secondary | ICD-10-CM | POA: Diagnosis not present

## 2020-05-01 DIAGNOSIS — Z7984 Long term (current) use of oral hypoglycemic drugs: Secondary | ICD-10-CM | POA: Diagnosis not present

## 2020-05-01 DIAGNOSIS — Z95828 Presence of other vascular implants and grafts: Secondary | ICD-10-CM

## 2020-05-01 DIAGNOSIS — Z79899 Other long term (current) drug therapy: Secondary | ICD-10-CM | POA: Insufficient documentation

## 2020-05-01 LAB — CMP (CANCER CENTER ONLY)
ALT: 19 U/L (ref 0–44)
AST: 17 U/L (ref 15–41)
Albumin: 3.9 g/dL (ref 3.5–5.0)
Alkaline Phosphatase: 56 U/L (ref 38–126)
Anion gap: 11 (ref 5–15)
BUN: 21 mg/dL (ref 8–23)
CO2: 21 mmol/L — ABNORMAL LOW (ref 22–32)
Calcium: 8.9 mg/dL (ref 8.9–10.3)
Chloride: 105 mmol/L (ref 98–111)
Creatinine: 1.1 mg/dL (ref 0.61–1.24)
GFR, Estimated: >60 mL/min (ref >60)
Glucose, Bld: 233 mg/dL — ABNORMAL HIGH (ref 70–99)
Potassium: 4.1 mmol/L (ref 3.5–5.1)
Sodium: 137 mmol/L (ref 135–145)
Total Bilirubin: 0.4 mg/dL (ref 0.3–1.2)
Total Protein: 7 g/dL (ref 6.5–8.1)

## 2020-05-01 LAB — CBC WITH DIFFERENTIAL (CANCER CENTER ONLY)
Abs Immature Granulocytes: 0.02 10*3/uL (ref 0.00–0.07)
Basophils Absolute: 0 10*3/uL (ref 0.0–0.1)
Basophils Relative: 0 %
Eosinophils Absolute: 0 10*3/uL (ref 0.0–0.5)
Eosinophils Relative: 0 %
HCT: 32.9 % — ABNORMAL LOW (ref 39.0–52.0)
Hemoglobin: 11.5 g/dL — ABNORMAL LOW (ref 13.0–17.0)
Immature Granulocytes: 0 %
Lymphocytes Relative: 6 %
Lymphs Abs: 0.6 10*3/uL — ABNORMAL LOW (ref 0.7–4.0)
MCH: 33.1 pg (ref 26.0–34.0)
MCHC: 35 g/dL (ref 30.0–36.0)
MCV: 94.8 fL (ref 80.0–100.0)
Monocytes Absolute: 0.4 10*3/uL (ref 0.1–1.0)
Monocytes Relative: 5 %
Neutro Abs: 8.3 10*3/uL — ABNORMAL HIGH (ref 1.7–7.7)
Neutrophils Relative %: 89 %
Platelet Count: 175 10*3/uL (ref 150–400)
RBC: 3.47 MIL/uL — ABNORMAL LOW (ref 4.22–5.81)
RDW: 13.4 % (ref 11.5–15.5)
WBC Count: 9.4 10*3/uL (ref 4.0–10.5)
nRBC: 0 % (ref 0.0–0.2)

## 2020-05-01 LAB — TOTAL PROTEIN, URINE DIPSTICK: Protein, ur: <30 mg/dL — AB

## 2020-05-01 MED ORDER — HEPARIN SOD (PORK) LOCK FLUSH 100 UNIT/ML IV SOLN
500.0000 [IU] | Freq: Once | INTRAVENOUS | Status: AC | PRN
  Administered 2020-05-01: 500 [IU]
  Filled 2020-05-01: qty 5

## 2020-05-01 MED ORDER — ALTEPLASE 2 MG IJ SOLR
2.0000 mg | Freq: Once | INTRAMUSCULAR | Status: AC
  Administered 2020-05-01: 2 mg
  Filled 2020-05-01: qty 2

## 2020-05-01 MED ORDER — SODIUM CHLORIDE 0.9 % IV SOLN
Freq: Once | INTRAVENOUS | Status: AC
  Filled 2020-05-01: qty 250

## 2020-05-01 MED ORDER — SODIUM CHLORIDE 0.9% FLUSH
10.0000 mL | Freq: Once | INTRAVENOUS | Status: AC
  Administered 2020-05-01: 10 mL
  Filled 2020-05-01: qty 10

## 2020-05-01 MED ORDER — ALTEPLASE 2 MG IJ SOLR
INTRAMUSCULAR | Status: AC
  Filled 2020-05-01: qty 2

## 2020-05-01 MED ORDER — SODIUM CHLORIDE 0.9 % IV SOLN
15.0000 mg/kg | Freq: Once | INTRAVENOUS | Status: AC
  Administered 2020-05-01: 1300 mg via INTRAVENOUS
  Filled 2020-05-01: qty 48

## 2020-05-01 MED ORDER — SODIUM CHLORIDE 0.9% FLUSH
10.0000 mL | INTRAVENOUS | Status: DC | PRN
  Administered 2020-05-01: 10 mL
  Filled 2020-05-01: qty 10

## 2020-05-01 NOTE — Patient Instructions (Signed)
[facility] Discharge Instructions for Patients Receiving Chemotherapy  Today you received the following chemotherapy agents Avastin  To help prevent nausea and vomiting after your treatment, we encourage you to take your nausea medication as directed   If you develop nausea and vomiting that is not controlled by your nausea medication, call the clinic.   BELOW ARE SYMPTOMS THAT SHOULD BE REPORTED IMMEDIATELY:  *FEVER GREATER THAN 100.5 F  *CHILLS WITH OR WITHOUT FEVER  NAUSEA AND VOMITING THAT IS NOT CONTROLLED WITH YOUR NAUSEA MEDICATION  *UNUSUAL SHORTNESS OF BREATH  *UNUSUAL BRUISING OR BLEEDING  TENDERNESS IN MOUTH AND THROAT WITH OR WITHOUT PRESENCE OF ULCERS  *URINARY PROBLEMS  *BOWEL PROBLEMS  UNUSUAL RASH Items with * indicate a potential emergency and should be followed up as soon as possible.  Feel free to call the clinic should you have any questions or concerns. The clinic phone number is [phone].  Please show the CHEMO ALERT CARD at check-in to the Emergency Department and triage nurse.   

## 2020-05-01 NOTE — Progress Notes (Signed)
[place] [phone]   Fax:(336) [phone]  OFFICE PROGRESS NOTE  [name], MD [place] [address]  DIAGNOSIS: Stage I/II malignant pleural mesothelioma involving the left hemithorax diagnosed in September 2021.  PRIOR THERAPY: None.  CURRENT THERAPY:  Systemic chemotherapy with carboplatin for AUC of 5, Alimta 500 mg/M2 and Avastin 15 mg/KG every 3 weeks.  Status post 7 cycles.  Starting from cycle #7 the patient will be on treatment with maintenance Avastin 15 mg/KG every 3 weeks.  INTERVAL HISTORY: Jack Haynes 80 y.o. male returns to the clinic today for follow-up visit.  The patient tolerated the last cycle of his treatment with maintenance Avastin fairly well.  He denied having any current chest pain, shortness of breath, cough or hemoptysis.  He denied having any fever or chills.  He has no nausea, vomiting, diarrhea or constipation.  He has no headache or visual changes.  He is here today for evaluation before starting cycle #8.  MEDICAL HISTORY: Past Medical History:  Diagnosis Date  . Arthritis   . Cancer (HCC)    hx of prostatae cancer  . Dyspnea    until chest tube placed  . Hypertension   . Mesothelioma [facility])     ALLERGIES:  has No Known Allergies.  MEDICATIONS:  Current Outpatient Medications  Medication Sig Dispense Refill  . acetaminophen (TYLENOL) 500 MG tablet Take 2 tablets (1,000 mg total) by mouth every 6 (six) hours as needed for mild pain or fever. 30 tablet 0  . dexamethasone (DECADRON) 4 MG tablet 1 tablet p.o. twice daily the day before, day of and day after chemotherapy every 3 weeks 40 tablet 1  . fluconazole (DIFLUCAN) 100 MG tablet TAKE 1 TABLET BY MOUTH EVERY DAY 7 tablet 0  . folic acid (FOLVITE) 1 MG tablet TAKE 1 TABLET BY MOUTH EVERY DAY 90 tablet 1  . lidocaine-prilocaine (EMLA) cream Apply to Port-A-Cath site 30-60-minute before treatment. 30 g 0  .  losartan-hydrochlorothiazide (HYZAAR) 100-12.5 MG tablet Take 1 tablet by mouth daily. 90 tablet 3  . metFORMIN (GLUCOPHAGE) 500 MG tablet Take 1 tablet (500 mg total) by mouth 2 (two) times daily with a meal. 180 tablet 3  . Misc Natural Products (NEURIVA PO) Take 1 capsule by mouth daily.    . ondansetron (ZOFRAN ODT) 4 MG disintegrating tablet Take 1 tablet (4 mg total) by mouth every 8 (eight) hours as needed for nausea or vomiting. Starting 3 days after chemotherapy 30 tablet 1  . prochlorperazine (COMPAZINE) 10 MG tablet TAKE 1 TABLET BY MOUTH EVERY 6 HOURS AS NEEDED FOR NAUSEA OR VOMITING. 30 tablet 0  . rosuvastatin (CRESTOR) 20 MG tablet Take 1 tablet (20 mg total) by mouth daily. 90 tablet 1   No current facility-administered medications for this visit.    SURGICAL HISTORY:  Past Surgical History:  Procedure Laterality Date  . APPENDECTOMY    . BACK SURGERY  1998   disectomy  . CHEST TUBE INSERTION Left 11/13/2019   Procedure: INSERTION PLEURAL DRAINAGE CATHETER;  Surgeon: Grace Isaac, MD;  Location: [place];  Service: Thoracic;  Laterality: Left;  . EYE SURGERY Left 1965   injury  . JOINT REPLACEMENT Left 2010   left knee replacement  . KNEE SURGERY    . PLEURAL BIOPSY Left 11/13/2019   Procedure: PLEURAL BIOPSY;  Surgeon: Grace Isaac, MD;  Location: [place];  Service: Thoracic;  Laterality: Left;  .  PORTACATH PLACEMENT Left 11/26/2019   Procedure: INSERTION PORT-A-CATH BARD POWERPORT 9.6 FR CATHETER;  Surgeon: Grace Isaac, MD;  Location: [place];  Service: Thoracic;  Laterality: Left;  . PROSTATE SURGERY    . REMOVAL OF PLEURAL DRAINAGE CATHETER Left 11/26/2019   Procedure: REMOVAL OF PLEURAL DRAINAGE CATHETER;  Surgeon: Grace Isaac, MD;  Location: [place];  Service: Thoracic;  Laterality: Left;  . SPINE SURGERY    . TALC PLEURODESIS Left 11/13/2019   Procedure: possible TALC PLEURADESIS;  Surgeon: Grace Isaac, MD;  Location: [place];  Service:  Thoracic;  Laterality: Left;  . TOTAL KNEE ARTHROPLASTY  02/01/2012   Procedure: TOTAL KNEE ARTHROPLASTY;  Surgeon: Sydnee Cabal, MD;  Location: [facility];  Service: Orthopedics;  Laterality: Left;  Marland Kitchen VIDEO ASSISTED THORACOSCOPY Left 11/13/2019   Procedure: VIDEO ASSISTED THORACOSCOPY;  Surgeon: Grace Isaac, MD;  Location: [place];  Service: Thoracic;  Laterality: Left;  Marland Kitchen VIDEO BRONCHOSCOPY N/A 11/13/2019   Procedure: VIDEO BRONCHOSCOPY;  Surgeon: Grace Isaac, MD;  Location: [facility];  Service: Thoracic;  Laterality: N/A;    REVIEW OF SYSTEMS:  A comprehensive review of systems was negative.   PHYSICAL EXAMINATION: General appearance: alert, cooperative and no distress Head: Normocephalic, without obvious abnormality, atraumatic Neck: no adenopathy, no JVD, supple, symmetrical, trachea midline and thyroid not enlarged, symmetric, no tenderness/mass/nodules Lymph nodes: Cervical, supraclavicular, and axillary nodes normal. Resp: clear to auscultation bilaterally Back: symmetric, no curvature. ROM normal. No CVA tenderness. Cardio: regular rate and rhythm, S1, S2 normal, no murmur, click, rub or gallop GI: soft, non-tender; bowel sounds normal; no masses,  no organomegaly Extremities: extremities normal, atraumatic, no cyanosis or edema  ECOG PERFORMANCE STATUS: 1 - Symptomatic but completely ambulatory  Blood pressure (!) 140/98, pulse 94, temperature (!) 97.1 F (36.2 C), temperature source Tympanic, resp. rate 17, height 5\' 8"  (1.727 m), weight 200 lb 11.2 oz (91 kg), SpO2 98 %.  LABORATORY DATA: Lab Results  Component Value Date   WBC 9.4 05/01/2020   HGB 11.5 (L) 05/01/2020   HCT 32.9 (L) 05/01/2020   MCV 94.8 05/01/2020   PLT 175 05/01/2020      Chemistry      Component Value Date/Time   NA 137 05/01/2020 0840   NA 141 09/24/2019 0832   K 4.1 05/01/2020 0840   CL 105 05/01/2020 0840   CO2 21 (L) 05/01/2020 0840   BUN 21 05/01/2020 0840   BUN 17 09/24/2019 0832    CREATININE 1.10 05/01/2020 0840   CREATININE 1.01 12/24/2014 0916      Component Value Date/Time   CALCIUM 8.9 05/01/2020 0840   ALKPHOS 56 05/01/2020 0840   AST 17 05/01/2020 0840   ALT 19 05/01/2020 0840   BILITOT 0.4 05/01/2020 0840       RADIOGRAPHIC STUDIES: LONG TERM MONITOR (3-14 DAYS)  Result Date: 04/23/2020 Enrollment 04/01/2020-04/08/2020 (6 days 19 hours). Patient had a min HR of 66 bpm (normal sinus rhythm), max HR of 197 bpm (supraventricular tachycardia), and avg HR of 98 bpm (normal sinus rhythm). Predominant underlying rhythm was Sinus Rhythm. 3 Supraventricular Tachycardia runs occurred, the run with the fastest interval lasting 15 beats (5.3 seconds) with a max rate of 197 bpm (avg 176 bpm); the run with the fastest interval was also the longest. Isolated SVEs were rare (<1.0%), SVE Couplets were rare (<1.0%), and SVE Triplets were rare (<1.0%). Isolated VEs were rare (<1.0%), and no VE Couplets or VE Triplets were present.  No diary submitted. Impression: 1. Brief ectopic atrial tachycardia episodes detected (3 in 7 days; longest 5.3 seconds). 2. Rare ectopy. [place] T. Audie Box, MD, [place] [address], [place],  [address] [phone] 4:38 PM    ASSESSMENT AND PLAN: This is a very pleasant 80 years old white male recently diagnosed with malignant pleural mesothelioma involving the left hemithorax. The patient is currently undergoing systemic chemotherapy with carboplatin for AUC of 5, Alimta 500 mg/M2 and Avastin 15 mg/KG every 3 weeks.  Status post 7 cycles.  Starting from cycle #7 the patient will be on maintenance treatment with Avastin 15 mg/KG every 3 weeks. He has been tolerating this treatment well with no concerning complaints.  He is very active and doing a lot of yard work. I recommended for the patient to proceed with cycle #8 today as planned. I will see him back for follow-up visit in 3 weeks for evaluation before  starting cycle #9. For the dental filling, he was advised to have the procedure done around 10 days after and 10 days before his treatment. For the hypertension he will continue to monitor his blood pressure closely and take his medication as prescribed. The patient was advised to call immediately if he has any other concerning symptoms in the interval. The patient voices understanding of current disease status and treatment options and is in agreement with the current care plan.  All questions were answered. The patient knows to call the clinic with any problems, questions or concerns. We can certainly see the patient much sooner if necessary.   Disclaimer: This note was dictated with voice recognition software. Similar sounding words can inadvertently be transcribed and may not be corrected upon review.

## 2020-05-01 NOTE — Progress Notes (Signed)
No blood return from port. Labs drawn peripherally.  Cathflo administered by Shelton Silvas, RN.

## 2020-05-08 ENCOUNTER — Other Ambulatory Visit: Payer: PPO

## 2020-05-15 ENCOUNTER — Other Ambulatory Visit: Payer: PPO

## 2020-05-19 ENCOUNTER — Other Ambulatory Visit: Payer: Self-pay | Admitting: Cardiothoracic Surgery

## 2020-05-19 ENCOUNTER — Other Ambulatory Visit: Payer: Self-pay | Admitting: Medical Oncology

## 2020-05-19 ENCOUNTER — Telehealth: Payer: Self-pay | Admitting: Medical Oncology

## 2020-05-19 DIAGNOSIS — Z95828 Presence of other vascular implants and grafts: Secondary | ICD-10-CM

## 2020-05-19 NOTE — Telephone Encounter (Signed)
LVM to return my call regarding port a cath procedure( reposition the tip)

## 2020-05-20 ENCOUNTER — Telehealth (HOSPITAL_COMMUNITY): Payer: Self-pay

## 2020-05-20 NOTE — Telephone Encounter (Signed)
Called to schedule port revision, no answer, left vm. AW

## 2020-05-22 ENCOUNTER — Inpatient Hospital Stay: Payer: PPO

## 2020-05-22 ENCOUNTER — Inpatient Hospital Stay: Payer: PPO | Attending: Internal Medicine

## 2020-05-22 ENCOUNTER — Inpatient Hospital Stay: Payer: PPO | Admitting: Internal Medicine

## 2020-05-22 ENCOUNTER — Encounter: Payer: Self-pay | Admitting: Internal Medicine

## 2020-05-22 ENCOUNTER — Other Ambulatory Visit: Payer: Self-pay

## 2020-05-22 ENCOUNTER — Telehealth (HOSPITAL_COMMUNITY): Payer: Self-pay

## 2020-05-22 VITALS — BP 129/55 | HR 79

## 2020-05-22 VITALS — BP 132/53 | HR 86 | Temp 98.4°F | Resp 16 | Ht 68.0 in | Wt 204.1 lb

## 2020-05-22 DIAGNOSIS — R0602 Shortness of breath: Secondary | ICD-10-CM | POA: Insufficient documentation

## 2020-05-22 DIAGNOSIS — I10 Essential (primary) hypertension: Secondary | ICD-10-CM | POA: Diagnosis not present

## 2020-05-22 DIAGNOSIS — M129 Arthropathy, unspecified: Secondary | ICD-10-CM | POA: Insufficient documentation

## 2020-05-22 DIAGNOSIS — I700 Atherosclerosis of aorta: Secondary | ICD-10-CM | POA: Insufficient documentation

## 2020-05-22 DIAGNOSIS — C457 Mesothelioma of other sites: Secondary | ICD-10-CM

## 2020-05-22 DIAGNOSIS — Z95828 Presence of other vascular implants and grafts: Secondary | ICD-10-CM

## 2020-05-22 DIAGNOSIS — Z7984 Long term (current) use of oral hypoglycemic drugs: Secondary | ICD-10-CM | POA: Insufficient documentation

## 2020-05-22 DIAGNOSIS — Z5111 Encounter for antineoplastic chemotherapy: Secondary | ICD-10-CM | POA: Diagnosis not present

## 2020-05-22 DIAGNOSIS — C3490 Malignant neoplasm of unspecified part of unspecified bronchus or lung: Secondary | ICD-10-CM | POA: Diagnosis not present

## 2020-05-22 DIAGNOSIS — Z79899 Other long term (current) drug therapy: Secondary | ICD-10-CM | POA: Insufficient documentation

## 2020-05-22 DIAGNOSIS — C450 Mesothelioma of pleura: Secondary | ICD-10-CM | POA: Diagnosis not present

## 2020-05-22 LAB — CBC WITH DIFFERENTIAL (CANCER CENTER ONLY)
Abs Immature Granulocytes: 0.02 10*3/uL (ref 0.00–0.07)
Basophils Absolute: 0 10*3/uL (ref 0.0–0.1)
Basophils Relative: 0 %
Eosinophils Absolute: 0 10*3/uL (ref 0.0–0.5)
Eosinophils Relative: 0 %
HCT: 34.3 % — ABNORMAL LOW (ref 39.0–52.0)
Hemoglobin: 11.9 g/dL — ABNORMAL LOW (ref 13.0–17.0)
Immature Granulocytes: 0 %
Lymphocytes Relative: 10 %
Lymphs Abs: 0.7 10*3/uL (ref 0.7–4.0)
MCH: 32.5 pg (ref 26.0–34.0)
MCHC: 34.7 g/dL (ref 30.0–36.0)
MCV: 93.7 fL (ref 80.0–100.0)
Monocytes Absolute: 0.4 10*3/uL (ref 0.1–1.0)
Monocytes Relative: 5 %
Neutro Abs: 6 10*3/uL (ref 1.7–7.7)
Neutrophils Relative %: 85 %
Platelet Count: 149 10*3/uL — ABNORMAL LOW (ref 150–400)
RBC: 3.66 MIL/uL — ABNORMAL LOW (ref 4.22–5.81)
RDW: 12.2 % (ref 11.5–15.5)
WBC Count: 7.1 10*3/uL (ref 4.0–10.5)
nRBC: 0 % (ref 0.0–0.2)

## 2020-05-22 LAB — CMP (CANCER CENTER ONLY)
ALT: 22 U/L (ref 0–44)
AST: 21 U/L (ref 15–41)
Albumin: 3.8 g/dL (ref 3.5–5.0)
Alkaline Phosphatase: 60 U/L (ref 38–126)
Anion gap: 15 (ref 5–15)
BUN: 26 mg/dL — ABNORMAL HIGH (ref 8–23)
CO2: 21 mmol/L — ABNORMAL LOW (ref 22–32)
Calcium: 8.6 mg/dL — ABNORMAL LOW (ref 8.9–10.3)
Chloride: 103 mmol/L (ref 98–111)
Creatinine: 1.2 mg/dL (ref 0.61–1.24)
GFR, Estimated: >60 mL/min (ref >60)
Glucose, Bld: 254 mg/dL — ABNORMAL HIGH (ref 70–99)
Potassium: 4.2 mmol/L (ref 3.5–5.1)
Sodium: 139 mmol/L (ref 135–145)
Total Bilirubin: 0.5 mg/dL (ref 0.3–1.2)
Total Protein: 6.7 g/dL (ref 6.5–8.1)

## 2020-05-22 LAB — TOTAL PROTEIN, URINE DIPSTICK: Protein, ur: <30 mg/dL — AB

## 2020-05-22 MED ORDER — HEPARIN SOD (PORK) LOCK FLUSH 100 UNIT/ML IV SOLN
500.0000 [IU] | Freq: Once | INTRAVENOUS | Status: AC | PRN
  Administered 2020-05-22: 500 [IU]
  Filled 2020-05-22: qty 5

## 2020-05-22 MED ORDER — SODIUM CHLORIDE 0.9% FLUSH
10.0000 mL | Freq: Once | INTRAVENOUS | Status: AC
  Administered 2020-05-22: 10 mL
  Filled 2020-05-22: qty 10

## 2020-05-22 MED ORDER — SODIUM CHLORIDE 0.9 % IV SOLN
Freq: Once | INTRAVENOUS | Status: AC
Start: 2020-05-22 — End: 2020-05-22
  Filled 2020-05-22: qty 250

## 2020-05-22 MED ORDER — SODIUM CHLORIDE 0.9% FLUSH
10.0000 mL | INTRAVENOUS | Status: DC | PRN
  Administered 2020-05-22: 10 mL
  Filled 2020-05-22: qty 10

## 2020-05-22 MED ORDER — SODIUM CHLORIDE 0.9 % IV SOLN
15.0000 mg/kg | Freq: Once | INTRAVENOUS | Status: AC
  Administered 2020-05-22: 1300 mg via INTRAVENOUS
  Filled 2020-05-22: qty 48

## 2020-05-22 NOTE — Progress Notes (Signed)
[place] [phone]) [phone]   Fax:(336) [phone]  OFFICE PROGRESS NOTE  [name], MD [place] [address]  DIAGNOSIS: Stage I/II malignant pleural mesothelioma involving the left hemithorax diagnosed in September 2021.  PRIOR THERAPY: None.  CURRENT THERAPY:  Systemic chemotherapy with carboplatin for AUC of 5, Alimta 500 mg/M2 and Avastin 15 mg/KG every 3 weeks.  Status post 8 cycles.  Starting from cycle #7 the patient will be on treatment with maintenance Avastin 15 mg/KG every 3 weeks.  INTERVAL HISTORY: Jack Haynes 79 y.o. male returns to the clinic today for follow-up visit accompanied by his wife.  The patient is feeling fine today with no concerning complaints.  He continues to tolerate his maintenance treatment with Alimta and Avastin fairly well.  He denied having any current chest pain, shortness of breath, cough or hemoptysis.  He denied having any fever or chills.  He has no nausea, vomiting, diarrhea or constipation.  He has no headache or visual changes.  He is here today for evaluation before starting cycle #9 of his treatment.  MEDICAL HISTORY: Past Medical History:  Diagnosis Date  . Arthritis   . Cancer (HCC)    hx of prostatae cancer  . Dyspnea    until chest tube placed  . Hypertension   . Mesothelioma [facility])     ALLERGIES:  has No Known Allergies.  MEDICATIONS:  Current Outpatient Medications  Medication Sig Dispense Refill  . acetaminophen (TYLENOL) 500 MG tablet Take 2 tablets (1,000 mg total) by mouth every 6 (six) hours as needed for mild pain or fever. 30 tablet 0  . dexamethasone (DECADRON) 4 MG tablet 1 tablet p.o. twice daily the day before, day of and day after chemotherapy every 3 weeks 40 tablet 1  . fluconazole (DIFLUCAN) 100 MG tablet TAKE 1 TABLET BY MOUTH EVERY DAY 7 tablet 0  . folic acid (FOLVITE) 1 MG tablet TAKE 1 TABLET BY MOUTH EVERY DAY 90 tablet 1  . lidocaine-prilocaine  (EMLA) cream Apply to Port-A-Cath site 30-60-minute before treatment. 30 g 0  . losartan-hydrochlorothiazide (HYZAAR) 100-12.5 MG tablet Take 1 tablet by mouth daily. 90 tablet 3  . metFORMIN (GLUCOPHAGE) 500 MG tablet Take 1 tablet (500 mg total) by mouth 2 (two) times daily with a meal. 180 tablet 3  . Misc Natural Products (NEURIVA PO) Take 1 capsule by mouth daily.    . ondansetron (ZOFRAN ODT) 4 MG disintegrating tablet Take 1 tablet (4 mg total) by mouth every 8 (eight) hours as needed for nausea or vomiting. Starting 3 days after chemotherapy 30 tablet 1  . prochlorperazine (COMPAZINE) 10 MG tablet TAKE 1 TABLET BY MOUTH EVERY 6 HOURS AS NEEDED FOR NAUSEA OR VOMITING. 30 tablet 0  . rosuvastatin (CRESTOR) 20 MG tablet Take 1 tablet (20 mg total) by mouth daily. 90 tablet 1   No current facility-administered medications for this visit.    SURGICAL HISTORY:  Past Surgical History:  Procedure Laterality Date  . APPENDECTOMY    . BACK SURGERY  1998   disectomy  . CHEST TUBE INSERTION Left 11/13/2019   Procedure: INSERTION PLEURAL DRAINAGE CATHETER;  Surgeon: Grace Isaac, MD;  Location: [place];  Service: Thoracic;  Laterality: Left;  . EYE SURGERY Left 1965   injury  . JOINT REPLACEMENT Left 2010   left knee replacement  . KNEE SURGERY    . PLEURAL BIOPSY Left 11/13/2019   Procedure: PLEURAL BIOPSY;  Surgeon: Grace Isaac, MD;  Location: [place];  Service: Thoracic;  Laterality: Left;  . PORTACATH PLACEMENT Left 11/26/2019   Procedure: INSERTION PORT-A-CATH BARD POWERPORT 9.6 FR CATHETER;  Surgeon: Grace Isaac, MD;  Location: [place];  Service: Thoracic;  Laterality: Left;  . PROSTATE SURGERY    . REMOVAL OF PLEURAL DRAINAGE CATHETER Left 11/26/2019   Procedure: REMOVAL OF PLEURAL DRAINAGE CATHETER;  Surgeon: Grace Isaac, MD;  Location: [place];  Service: Thoracic;  Laterality: Left;  . SPINE SURGERY    . TALC PLEURODESIS Left 11/13/2019   Procedure: possible TALC  PLEURADESIS;  Surgeon: Grace Isaac, MD;  Location: [place];  Service: Thoracic;  Laterality: Left;  . TOTAL KNEE ARTHROPLASTY  02/01/2012   Procedure: TOTAL KNEE ARTHROPLASTY;  Surgeon: Sydnee Cabal, MD;  Location[facility];  Service: Orthopedics;  Laterality: Left;  Marland Kitchen VIDEO ASSISTED THORACOSCOPY Left 11/13/2019   Procedure: VIDEO ASSISTED THORACOSCOPY;  Surgeon: Grace Isaac, MD;  Location: [place];  Service: Thoracic;  Laterality: Left;  Marland Kitchen VIDEO BRONCHOSCOPY N/A 11/13/2019   Procedure: VIDEO BRONCHOSCOPY;  Surgeon: Grace Isaac, MD;  Location: [facility];  Service: Thoracic;  Laterality: N/A;    REVIEW OF SYSTEMS:  A comprehensive review of systems was negative.   PHYSICAL EXAMINATION: General appearance: alert, cooperative and no distress Head: Normocephalic, without obvious abnormality, atraumatic Neck: no adenopathy, no JVD, supple, symmetrical, trachea midline and thyroid not enlarged, symmetric, no tenderness/mass/nodules Lymph nodes: Cervical, supraclavicular, and axillary nodes normal. Resp: clear to auscultation bilaterally Back: symmetric, no curvature. ROM normal. No CVA tenderness. Cardio: regular rate and rhythm, S1, S2 normal, no murmur, click, rub or gallop GI: soft, non-tender; bowel sounds normal; no masses,  no organomegaly Extremities: extremities normal, atraumatic, no cyanosis or edema  ECOG PERFORMANCE STATUS: 0 - Asymptomatic  Blood pressure (!) 132/53, pulse 86, temperature 98.4 F (36.9 C), temperature source Tympanic, resp. rate 16, height 5\' 8"  (1.727 m), weight 204 lb 1.6 oz (92.6 kg), SpO2 98 %.  LABORATORY DATA: Lab Results  Component Value Date   WBC 7.1 05/22/2020   HGB 11.9 (L) 05/22/2020   HCT 34.3 (L) 05/22/2020   MCV 93.7 05/22/2020   PLT 149 (L) 05/22/2020      Chemistry      Component Value Date/Time   NA 137 05/01/2020 0840   NA 141 09/24/2019 0832   K 4.1 05/01/2020 0840   CL 105 05/01/2020 0840   CO2 21 (L) 05/01/2020 0840    BUN 21 05/01/2020 0840   BUN 17 09/24/2019 0832   CREATININE 1.10 05/01/2020 0840   CREATININE 1.01 12/24/2014 0916      Component Value Date/Time   CALCIUM 8.9 05/01/2020 0840   ALKPHOS 56 05/01/2020 0840   AST 17 05/01/2020 0840   ALT 19 05/01/2020 0840   BILITOT 0.4 05/01/2020 0840       RADIOGRAPHIC STUDIES: No results found.  ASSESSMENT AND PLAN: This is a very pleasant 80 years old white male recently diagnosed with malignant pleural mesothelioma involving the left hemithorax. The patient is currently undergoing systemic chemotherapy with carboplatin for AUC of 5, Alimta 500 mg/M2 and Avastin 15 mg/KG every 3 weeks.  Status post 8 cycles.  Starting from cycle #7 the patient will be on maintenance treatment with Avastin 15 mg/KG every 3 weeks. The patient continues to tolerate this treatment well with no concerning adverse effects. I recommended for him to proceed with cycle #9 today as planned. I  will see him back for follow-up visit in 3 weeks for evaluation before starting cycle #10 with repeat CT scan of the chest for restaging of his disease. He was advised to call immediately if he has any concerning symptoms in the interval. The patient voices understanding of current disease status and treatment options and is in agreement with the current care plan.  All questions were answered. The patient knows to call the clinic with any problems, questions or concerns. We can certainly see the patient much sooner if necessary.   Disclaimer: This note was dictated with voice recognition software. Similar sounding words can inadvertently be transcribed and may not be corrected upon review.

## 2020-05-22 NOTE — Patient Instructions (Signed)
[facility] Discharge Instructions for Patients Receiving Chemotherapy  Today you received the following chemotherapy agents Avastin  To help prevent nausea and vomiting after your treatment, we encourage you to take your nausea medication as directed   If you develop nausea and vomiting that is not controlled by your nausea medication, call the clinic.   BELOW ARE SYMPTOMS THAT SHOULD BE REPORTED IMMEDIATELY:  *FEVER GREATER THAN 100.5 F  *CHILLS WITH OR WITHOUT FEVER  NAUSEA AND VOMITING THAT IS NOT CONTROLLED WITH YOUR NAUSEA MEDICATION  *UNUSUAL SHORTNESS OF BREATH  *UNUSUAL BRUISING OR BLEEDING  TENDERNESS IN MOUTH AND THROAT WITH OR WITHOUT PRESENCE OF ULCERS  *URINARY PROBLEMS  *BOWEL PROBLEMS  UNUSUAL RASH Items with * indicate a potential emergency and should be followed up as soon as possible.  Feel free to call the clinic should you have any questions or concerns. The clinic phone number is [phone].  Please show the CHEMO ALERT CARD at check-in to the Emergency Department and triage nurse.   

## 2020-05-22 NOTE — Progress Notes (Signed)
This RN informed patient that his port placement was not appropriate to infuse avastin through. Patient verbalized understanding and an IV was placed prior to infusing avastin. MD made aware, patient verbalized understanding that he will be scheduled for an appointment to have his port repaired. Patient left the infusion room in no distress and with no further concerns.

## 2020-05-22 NOTE — Telephone Encounter (Signed)
-----   Message from Corrie Mckusick, DO sent at 05/20/2020 10:13 AM EDT ----- Regarding: RE: port reposition Caryl Pina thank you.  I would make sure the cancer center has tried a tPA/Cathflow treatment, and if already done/when complete and failed, then we can set up for a port revision/placement at [facility].   Thank you Earleen Newport    ----- Message ----- From: Danielle Dess Sent: 05/20/2020   8:33 AM EDT To: Ir Procedure Requests Subject: port reposition                                 Procedure: The cancer center called today to get this patient on today or tomorrow for port reposition.   Reason: port a cath tip needs to be repositioned to cavo atrial jx   Imaging: Ct chest done 2/14   Please advise.   Thanks,  Lia Foyer

## 2020-05-26 ENCOUNTER — Other Ambulatory Visit: Payer: Self-pay | Admitting: Radiology

## 2020-05-27 ENCOUNTER — Other Ambulatory Visit: Payer: Self-pay

## 2020-05-27 ENCOUNTER — Ambulatory Visit (HOSPITAL_COMMUNITY)
Admission: RE | Admit: 2020-05-27 | Discharge: 2020-05-27 | Disposition: A | Discharge status: Home / Self Care | Payer: PPO | Source: Ambulatory Visit | Attending: Internal Medicine | Admitting: Internal Medicine

## 2020-05-27 ENCOUNTER — Encounter (HOSPITAL_COMMUNITY): Payer: Self-pay

## 2020-05-27 DIAGNOSIS — Z8249 Family history of ischemic heart disease and other diseases of the circulatory system: Secondary | ICD-10-CM | POA: Diagnosis not present

## 2020-05-27 DIAGNOSIS — I10 Essential (primary) hypertension: Secondary | ICD-10-CM | POA: Diagnosis not present

## 2020-05-27 DIAGNOSIS — Z87891 Personal history of nicotine dependence: Secondary | ICD-10-CM | POA: Insufficient documentation

## 2020-05-27 DIAGNOSIS — Z809 Family history of malignant neoplasm, unspecified: Secondary | ICD-10-CM | POA: Insufficient documentation

## 2020-05-27 DIAGNOSIS — Z7982 Long term (current) use of aspirin: Secondary | ICD-10-CM | POA: Insufficient documentation

## 2020-05-27 DIAGNOSIS — C459 Mesothelioma, unspecified: Secondary | ICD-10-CM | POA: Insufficient documentation

## 2020-05-27 DIAGNOSIS — Z8546 Personal history of malignant neoplasm of prostate: Secondary | ICD-10-CM | POA: Diagnosis not present

## 2020-05-27 DIAGNOSIS — Z7984 Long term (current) use of oral hypoglycemic drugs: Secondary | ICD-10-CM | POA: Diagnosis not present

## 2020-05-27 DIAGNOSIS — Z79899 Other long term (current) drug therapy: Secondary | ICD-10-CM | POA: Diagnosis not present

## 2020-05-27 DIAGNOSIS — Z95828 Presence of other vascular implants and grafts: Secondary | ICD-10-CM

## 2020-05-27 DIAGNOSIS — Z96652 Presence of left artificial knee joint: Secondary | ICD-10-CM | POA: Diagnosis not present

## 2020-05-27 DIAGNOSIS — Z452 Encounter for adjustment and management of vascular access device: Secondary | ICD-10-CM | POA: Insufficient documentation

## 2020-05-27 HISTORY — PX: IR PORT REPAIR CENTRAL VENOUS ACCESS DEVICE: IMG5775

## 2020-05-27 MED ORDER — MIDAZOLAM HCL 2 MG/2ML IJ SOLN
INTRAMUSCULAR | Status: AC
  Filled 2020-05-27: qty 2

## 2020-05-27 MED ORDER — HEPARIN SOD (PORK) LOCK FLUSH 100 UNIT/ML IV SOLN
INTRAVENOUS | Status: AC
  Filled 2020-05-27: qty 5

## 2020-05-27 MED ORDER — MIDAZOLAM HCL 2 MG/2ML IJ SOLN
INTRAMUSCULAR | Status: AC | PRN
  Administered 2020-05-27: 0.5 mg via INTRAVENOUS
  Administered 2020-05-27: 1 mg via INTRAVENOUS

## 2020-05-27 MED ORDER — LIDOCAINE HCL (PF) 1 % IJ SOLN
INTRAMUSCULAR | Status: AC | PRN
  Administered 2020-05-27: 10 mL

## 2020-05-27 MED ORDER — LIDOCAINE HCL 1 % IJ SOLN
INTRAMUSCULAR | Status: AC
  Filled 2020-05-27: qty 20

## 2020-05-27 MED ORDER — SODIUM CHLORIDE 0.9 % IV SOLN
INTRAVENOUS | Status: AC | PRN
  Administered 2020-05-27: 10 mL/h via INTRAVENOUS

## 2020-05-27 MED ORDER — LIDOCAINE-EPINEPHRINE 1 %-1:100000 IJ SOLN
INTRAMUSCULAR | Status: AC
  Filled 2020-05-27: qty 1

## 2020-05-27 MED ORDER — FENTANYL CITRATE (PF) 100 MCG/2ML IJ SOLN
INTRAMUSCULAR | Status: AC | PRN
  Administered 2020-05-27: 25 ug via INTRAVENOUS
  Administered 2020-05-27: 50 ug via INTRAVENOUS

## 2020-05-27 MED ORDER — SODIUM CHLORIDE 0.9 % IV SOLN: INTRAVENOUS | Status: DC

## 2020-05-27 MED ORDER — FENTANYL CITRATE (PF) 100 MCG/2ML IJ SOLN
INTRAMUSCULAR | Status: AC
  Filled 2020-05-27: qty 2

## 2020-05-27 NOTE — Progress Notes (Signed)
Removed NSL left Hand , cath intact site U.

## 2020-05-27 NOTE — H&P (Signed)
Chief Complaint: Patient was seen in consultation today for [facility] a cath revision/replacement at the request of [facility]  Referring Physician(s): Mohamed,Mohamed  Supervising Physician: Arne Cleveland  Patient Status: [facility] - Out-pt  History of Present Illness: Jack Haynes is a 80 y.o. male   Hx malignant pleural mesothelioma Sept 2021 Port a cath was placed by Dr Servando Snare 11/2019 in OR Has very little trouble with PAC overall Started noticing difficulty with blood draws off and on x 1 - 2 months RN reporting unable to infuse medication 05/22/20. Has tried tpa infusion--- no help  Request for IR to evaluate PAC Plan for possible revision/repostioning/replacement    Past Medical History:  Diagnosis Date  . Arthritis   . Cancer (HCC)    hx of prostatae cancer  . Dyspnea    until chest tube placed  . Hypertension   . Mesothelioma [facility])     Past Surgical History:  Procedure Laterality Date  . APPENDECTOMY    . BACK SURGERY  1998   disectomy  . CHEST TUBE INSERTION Left 11/13/2019   Procedure: INSERTION PLEURAL DRAINAGE CATHETER;  Surgeon: Grace Isaac, MD;  Location: [place];  Service: Thoracic;  Laterality: Left;  . EYE SURGERY Left 1965   injury  . JOINT REPLACEMENT Left 2010   left knee replacement  . KNEE SURGERY    . PLEURAL BIOPSY Left 11/13/2019   Procedure: PLEURAL BIOPSY;  Surgeon: Grace Isaac, MD;  Location: [place];  Service: Thoracic;  Laterality: Left;  . PORTACATH PLACEMENT Left 11/26/2019   Procedure: INSERTION PORT-A-CATH BARD POWERPORT 9.6 FR CATHETER;  Surgeon: Grace Isaac, MD;  Location: [place];  Service: Thoracic;  Laterality: Left;  . PROSTATE SURGERY    . REMOVAL OF PLEURAL DRAINAGE CATHETER Left 11/26/2019   Procedure: REMOVAL OF PLEURAL DRAINAGE CATHETER;  Surgeon: Grace Isaac, MD;  Location: [place];  Service: Thoracic;  Laterality: Left;  . SPINE SURGERY    . TALC PLEURODESIS Left 11/13/2019   Procedure:  possible TALC PLEURADESIS;  Surgeon: Grace Isaac, MD;  Location: [place];  Service: Thoracic;  Laterality: Left;  . TOTAL KNEE ARTHROPLASTY  02/01/2012   Procedure: TOTAL KNEE ARTHROPLASTY;  Surgeon: Sydnee Cabal, MD;  Location: [facility];  Service: Orthopedics;  Laterality: Left;  Marland Kitchen VIDEO ASSISTED THORACOSCOPY Left 11/13/2019   Procedure: VIDEO ASSISTED THORACOSCOPY;  Surgeon: Grace Isaac, MD;  Location: [place];  Service: Thoracic;  Laterality: Left;  Marland Kitchen VIDEO BRONCHOSCOPY N/A 11/13/2019   Procedure: VIDEO BRONCHOSCOPY;  Surgeon: Grace Isaac, MD;  Location: [facility] OR;  Service: Thoracic;  Laterality: N/A;    Allergies: Patient has no known allergies.  Medications: Prior to Admission medications   Medication Sig Start Date End Date Taking? Authorizing Provider  acetaminophen (TYLENOL) 500 MG tablet Take 2 tablets (1,000 mg total) by mouth every 6 (six) hours as needed for mild pain or fever. Patient taking differently: Take 500 mg by mouth every 6 (six) hours as needed for mild pain or fever. 11/15/19  Yes Barrett, Lodema Hong, PA-C  aspirin EC 81 MG tablet Take 81 mg by mouth daily. Swallow whole.   Yes [provider]  calcium carbonate (TUMS - DOSED IN MG ELEMENTAL CALCIUM) 500 MG chewable tablet Chew 1,000 mg by mouth daily as needed for indigestion or heartburn.   Yes [provider]  cetirizine (ZYRTEC) 10 MG tablet Take 10 mg by mouth daily.   Yes [provider]  dexamethasone (  DECADRON) 4 MG tablet 1 tablet p.o. twice daily the day before, day of and day after chemotherapy every 3 weeks Patient taking differently: Take 4 mg by mouth See admin instructions. 4 mg twice daily the day before, day of and day after chemotherapy every 3 weeks 11/23/19  Yes Curt Bears, MD  fluconazole (DIFLUCAN) 100 MG tablet TAKE 1 TABLET BY MOUTH EVERY DAY Patient taking differently: Take 100 mg by mouth daily as needed (thrush). 03/04/20  Yes Heilingoetter, Cassandra L,  PA-C  folic acid (FOLVITE) 1 MG tablet TAKE 1 TABLET BY MOUTH EVERY DAY Patient taking differently: Take 1 mg by mouth daily. 02/18/20  Yes Curt Bears, MD  lidocaine-prilocaine (EMLA) cream Apply to Port-A-Cath site 30-60-minute before treatment. Patient taking differently: Apply 1 application topically daily as needed (Apply to Port-A-Cath site 30-60-minute before treatment.). 11/23/19  Yes Curt Bears, MD  losartan-hydrochlorothiazide [facility]) 100-12.5 MG tablet Take 1 tablet by mouth daily. 01/15/20  Yes Sagardia, Ines Bloomer, MD  metFORMIN (GLUCOPHAGE-XR) 500 MG 24 hr tablet Take 500 mg by mouth 2 (two) times daily.   Yes [provider]  rosuvastatin (CRESTOR) 20 MG tablet Take 1 tablet (20 mg total) by mouth daily. 03/24/20  Yes O'Neal, Cassie Freer, MD  simethicone (MYLICON) 409 MG chewable tablet Chew 125 mg by mouth daily as needed (gas).   Yes [provider]  metFORMIN (GLUCOPHAGE) 500 MG tablet Take 1 tablet (500 mg total) by mouth 2 (two) times daily with a meal. 03/13/20   Sagardia, Ines Bloomer, MD  ondansetron (ZOFRAN ODT) 4 MG disintegrating tablet Take 1 tablet (4 mg total) by mouth every 8 (eight) hours as needed for nausea or vomiting. Starting 3 days after chemotherapy 02/28/20   Heilingoetter, Cassandra L, PA-C  prochlorperazine (COMPAZINE) 10 MG tablet TAKE 1 TABLET BY MOUTH EVERY 6 HOURS AS NEEDED FOR NAUSEA OR VOMITING. Patient taking differently: Take 10 mg by mouth every 6 (six) hours as needed for vomiting or nausea. 04/28/20   Curt Bears, MD     Family History  Problem Relation Age of Onset  . Cancer Sister        brain tumor  . Heart disease Brother   . Alzheimer's disease Brother   . Heart disease Sister   . Alzheimer's disease Father   . Hypertension Sister   . Colon cancer Neg Hx   . Colon polyps Neg Hx   . Esophageal cancer Neg Hx   . Rectal cancer Neg Hx   . Stomach cancer Neg Hx     Social History   Socioeconomic History   . Marital status: Divorced    Spouse name: Not on file  . Number of children: 2  . Years of education: 33  . Highest education level: Not on file  Occupational History  . 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   Tobacco Use  . Smoking status: Former Smoker    Years: 15.00    Quit date: 02/15/1981    Years since quitting: 39.3  . Smokeless tobacco: Former Systems developer    Types: Strang date: 12/2018  Vaping Use  . Vaping Use: Never used  Substance and Sexual Activity  . Alcohol use: Yes    Alcohol/week: 3.0 standard drinks    Types: 3 Shots of liquor per week  . Drug use: No  . Sexual activity: Not on file  Other Topics Concern  . Not on file  Social History Narrative  .  Not on file   Social Determinants of Health   Financial Resource Strain: Not on file  Food Insecurity: Not on file  Transportation Needs: Not on file  Physical Activity: Not on file  Stress: Not on file  Social Connections: Not on file     Review of Systems: A 12 point ROS discussed and pertinent positives are indicated in the HPI above.  All other systems are negative.  Review of Systems  Constitutional: Negative for activity change, fatigue and fever.  HENT: Positive for hearing loss.   Respiratory: Negative for cough and shortness of breath.   Cardiovascular: Negative for chest pain.  Gastrointestinal: Negative for abdominal pain and nausea.  Musculoskeletal: Negative for back pain.  Psychiatric/Behavioral: Negative for behavioral problems and confusion.    Vital Signs: BP (!) 158/91   Pulse 79   Temp 97.7 F (36.5 C) (Oral)   Ht 5\' 7"  (1.702 m)   Wt 204 lb 2.3 oz (92.6 kg)   SpO2 100%   BMI 31.97 kg/m   Physical Exam Vitals reviewed.  HENT:     Mouth/Throat:     Mouth: Mucous membranes are moist.  Cardiovascular:     Rate and Rhythm: Normal rate and regular rhythm.     Heart sounds: Normal heart sounds.  Pulmonary:     Effort: Pulmonary effort is normal.      Breath sounds: Normal breath sounds.  Abdominal:     Palpations: Abdomen is soft.  Musculoskeletal:        General: Normal range of motion.  Skin:    General: Skin is warm.     Comments: Left PAC intact  Neurological:     Mental Status: He is alert and oriented to person, place, and time.  Psychiatric:        Behavior: Behavior normal.     Imaging: No results found.  Labs:  CBC: Recent Labs    04/03/20 0755 04/10/20 0750 05/01/20 0840 05/22/20 0749  WBC 4.0 5.8 9.4 7.1  HGB 10.6* 10.6* 11.5* 11.9*  HCT 30.6* 30.1* 32.9* 34.3*  PLT 81* 226 175 149*    COAGS: Recent Labs    11/13/19 0716 11/26/19 0651  INR 1.0 1.0  APTT 31 30    BMP: Recent Labs    10/31/19 0529 11/13/19 0716 11/14/19 0235 11/15/19 0208 11/23/19 0845 04/03/20 0755 04/10/20 0750 05/01/20 0840 05/22/20 0749  NA 135 135 133* 135   < > 138 133* 137 139  K 3.7 3.9 5.1 4.4   < > 3.7 4.1 4.1 4.2  CL 101 103 102 102   < > 106 102 105 103  CO2 24 24 22 26    < > 23 20* 21* 21*  GLUCOSE 122* 128* 191* 145*   < > 186* 319* 233* 254*  BUN 16 15 15 16    < > 15 16 21  26*  CALCIUM 8.7* 8.4* 8.6* 8.1*   < > 8.4* 8.2* 8.9 8.6*  CREATININE 0.74 0.98 0.98 0.90   < > 0.98 1.06 1.10 1.20  GFRNONAA >60 >60 >60 >60   < > >60 >60 >60 >60  GFRAA >60 >60 >60 >60  --   --   --   --   --    < > = values in this interval not displayed.    LIVER FUNCTION TESTS: Recent Labs    04/03/20 0755 04/10/20 0750 05/01/20 0840 05/22/20 0749  BILITOT 0.3 0.4 0.4 0.5  AST 23 16 17  21  ALT 27 21 19 22   ALKPHOS 72 67 56 60  PROT 6.7 6.9 7.0 6.7  ALBUMIN 3.7 3.7 3.9 3.8    TUMOR MARKERS: No results for input(s): AFPTM, CEA, CA199, CHROMGRNA in the last 8760 hours.  Assessment and Plan:  Dx 10/2019: Malignant pleural mesothelioma PAC placed in OR 11/2019 Now with blood draw issues and unable to infuse medication-- even after tpa attempt 05/22/20 Now scheduled in IR for Port a cath evaluation-  revision/repositioning/replacement Pt is aware of procedure benefits and risks including but not limited to Infection; bleeding; vessel damage Agreeable to proceed Consent signed and  In chart  Thank you for this interesting consult.  I greatly enjoyed meeting EGIDIO LOFGREN and look forward to participating in their care.  A copy of this report was sent to the requesting provider on this date.  Electronically Signed: Lavonia Drafts, PA-C 05/27/2020, 10:52 AM   I spent a total of  30 Minutes   in face to face in clinical consultation, greater than 50% of which was counseling/coordinating care for [facility] evaluation; possible revision/repositioning/replacement

## 2020-05-27 NOTE — Discharge Instructions (Addendum)
Implanted Port Insertion, Care After This sheet gives you information about how to care for yourself after your procedure. Your health care provider may also give you more specific instructions. If you have problems or questions, contact your health care provider. What can I expect after the procedure? After the procedure, it is common to have:  Discomfort at the port insertion site.  Bruising on the skin over the port. This should improve over 3-4 days. Follow these instructions at home: [facility] care  After your port is placed, you will get a manufacturer's information card. The card has information about your port. Keep this card with you at all times.  Take care of the port as told by your health care provider. Ask your health care provider if you or a family member can get training for taking care of the port at home. A home health care nurse may also take care of the port.  Make sure to remember what type of port you have. Incision care  Follow instructions from your health care provider about how to take care of your port insertion site. Make sure you: ? Wash your hands with soap and water before and after you change your bandage (dressing). If soap and water are not available, use hand sanitizer. ? Change your dressing as told by your health care provider. ? Leave stitches (sutures), skin glue, or adhesive strips in place. These skin closures may need to stay in place for 2 weeks or longer. If adhesive strip edges start to loosen and curl up, you may trim the loose edges. Do not remove adhesive strips completely unless your health care provider tells you to do that.  Check your port insertion site every day for signs of infection. Check for: ? Redness, swelling, or pain. ? Fluid or blood. ? Warmth. ? Pus or a bad smell.      Activity  Return to your normal activities as told by your health care provider. Ask your health care provider what activities are safe for you.  Do not  lift anything that is heavier than 10 lb (4.5 kg), or the limit that you are told, until your health care provider says that it is safe. General instructions  Take over-the-counter and prescription medicines only as told by your health care provider.  Do not take baths, swim, or use a hot tub until your health care provider approves. Ask your health care provider if you may take showers. You may only be allowed to take sponge baths.  Do not drive for 24 hours if you were given a sedative during your procedure.  Wear a medical alert bracelet in case of an emergency. This will tell any health care providers that you have a port.  Keep all follow-up visits as told by your health care provider. This is important. Contact a health care provider if:  You cannot flush your port with saline as directed, or you cannot draw blood from the port.  You have a fever or chills.  You have redness, swelling, or pain around your port insertion site.  You have fluid or blood coming from your port insertion site.  Your port insertion site feels warm to the touch.  You have pus or a bad smell coming from the port insertion site. Get help right away if:  You have chest pain or shortness of breath.  You have bleeding from your port that you cannot control. Summary  Take care of the port as told by your  health care provider. Keep the manufacturer's information card with you at all times.  Change your dressing as told by your health care provider.  Contact a health care provider if you have a fever or chills or if you have redness, swelling, or pain around your port insertion site.  Keep all follow-up visits as told by your health care provider. This information is not intended to replace advice given to you by your health care provider. Make sure you discuss any questions you have with your health care provider. Document Revised: 08/30/2017 Document Reviewed: 08/30/2017 Elsevier Patient Education   2021 [employer]. Moderate Conscious Sedation, Adult Sedation is the use of medicines to promote relaxation and to relieve discomfort and anxiety. Moderate conscious sedation is a type of sedation. Under moderate conscious sedation, you are less alert than normal, but you are still able to respond to instructions, touch, or both. Moderate conscious sedation is used during short medical and dental procedures. It is milder than deep sedation, which is a type of sedation under which you cannot be easily woken up. It is also milder than general anesthesia, which is the use of medicines to make you unconscious. Moderate conscious sedation allows you to return to your regular activities sooner. Tell a health care provider about:  Any allergies you have.  All medicines you are taking, including vitamins, herbs, eye drops, creams, and over-the-counter medicines.  Any use of steroids. This includes steroids taken by mouth or as a cream.  Any problems you or family members have had with sedatives and anesthetic medicines.  Any blood disorders you have.  Any surgeries you have had.  Any medical conditions you have, such as sleep apnea.  Whether you are pregnant or may be pregnant.  Any use of cigarettes, alcohol, marijuana, or drugs. What are the risks? Generally, this is a safe procedure. However, problems may occur, including:  Getting too much medicine (oversedation).  Nausea.  Allergic reaction to medicines.  Trouble breathing. If this happens, a breathing tube may be used. It will be removed when you are awake and breathing on your own.  Heart trouble.  Lung trouble.  Confusion that gets better with time (emergence delirium). What happens before the procedure? Staying hydrated Follow instructions from your health care provider about hydration, which may include:  Up to 2 hours before the procedure - you may continue to drink clear liquids, such as water, clear fruit juice, black  coffee, and plain tea. Eating and drinking restrictions Follow instructions from your health care provider about eating and drinking, which may include:  8 hours before the procedure - stop eating heavy meals or foods, such as meat, fried foods, or fatty foods.  6 hours before the procedure - stop eating light meals or foods, such as toast or cereal.  6 hours before the procedure - stop drinking milk or drinks that contain milk.  2 hours before the procedure - stop drinking clear liquids. Medicines Ask your health care provider about:  Changing or stopping your regular medicines. This is especially important if you are taking diabetes medicines or blood thinners.  Taking medicines such as aspirin and ibuprofen. These medicines can thin your blood. Do not take these medicines unless your health care provider tells you to take them.  Taking over-the-counter medicines, vitamins, herbs, and supplements. Tests and exams  You will have a physical exam.  You may have blood tests done to show how well: ? Your kidneys and liver work. ? Your  blood clots. General instructions  Plan to have a responsible adult take you home from the hospital or clinic.  If you will be going home right after the procedure, plan to have a responsible adult care for you for the time you are told. This is important. What happens during the procedure?  You will be given the sedative. The sedative may be given: ? As a pill that you will swallow. It can also be inserted into the rectum. ? As a spray through the nose. ? As an injection into the muscle. ? As an injection into the vein through an IV.  You may be given oxygen as needed.  Your breathing, heart rate, and blood pressure will be monitored during the procedure.  The medical or dental procedure will be done. The procedure may vary among health care providers and hospitals.   What happens after the procedure?  Your blood pressure, heart rate,  breathing rate, and blood oxygen level will be monitored until you leave the hospital or clinic.  You will get fluids through your IV if needed.  Do not drive or operate machinery until your health care provider says that it is safe. Summary  Sedation is the use of medicines to promote relaxation and to relieve discomfort and anxiety. Moderate conscious sedation is a type of sedation that is used during short medical and dental procedures.  Tell the health care provider about any medical conditions that you have and about all the medicines that you are taking.  You will be given the sedative as a pill, a spray through the nose, an injection into the muscle, or an injection into the vein through an IV. Vital signs are monitored during the sedation.  Moderate conscious sedation allows you to return to your regular activities sooner. This information is not intended to replace advice given to you by your health care provider. Make sure you discuss any questions you have with your health care provider. Document Revised: 06/01/2019 Document Reviewed: 12/28/2018 Elsevier Patient Education  2021 [employer].

## 2020-05-27 NOTE — Procedures (Signed)
  Procedure: Revision of L chest port catheter   EBL:   minimal Complications:  none immediate  See full dictation in [facility].  Dillard Cannon MD Main # [phone] Pager  [phone] Mobile [phone]

## 2020-05-29 NOTE — Progress Notes (Signed)
Cardiology Office Note:   Date:  05/30/2020  NAME:  Jack Haynes    MRN: [id number] DOB:  1940/10/17   PCP:  [name], MD  Cardiologist:  No primary care provider on file.   Referring MD: [name], *   Chief Complaint  Patient presents with  . Follow-up   History of Present Illness:   Jack Haynes is a 80 y.o. male with a hx of mesothelioma, DM, HLD, RBBB who presents for follow-up. Seen 03/24/2020 for sinus tachycardia. Monitor without significant arrhythmia. Normal echo. TSH elevated but normal T4/T3. Aortic atherosclerosis seen on CT and crestor increased.   He reports he is tolerating his increased dose of Crestor.  He is having no palpitations or chest pain or shortness of breath.  He is walking 3 times per week up to 15 minutes.  Denies any symptoms with this.  Of note he reports that there has been a change in his chemotherapeutic regimen.  His heart rate today is in the 80s.  When he wore his heart monitor he had brief extra heartbeats and an average heart rate of 98.  I highly suspect this was all related to chemotherapy.  It appears they have found a better regimen for him.  He reports more energy and he is feeling well.  He was started on aspirin 81 mg daily to help with his port.  Apparently there were issues with pulling blood draws off the port.  He had it exchanged this past week.  He seems to be doing well with this.  He overall is in good health all things considered.  He denies any symptoms of chest pain, shortness of breath or palpitations.  We discussed that his monitor showed brief extra heartbeats that do not require treatment.  Given his lack of symptoms and normal echocardiogram I see no need for further treatment or cardiac evaluation.  Problem List 1. Stage I/II malignant pleural mesothelioma L hemithorax -currently on chemo 2. Diabetes -A1c 7.6 3. HLD -T chol 184, HDL 40, LDL 127, TG 94 4. HTN 5. RBBB 6. Sinus tachycardia (average  HR 98 bpm) 7. Coronary calcifications on CT (05/19/2020)  Past Medical History: Past Medical History:  Diagnosis Date  . Arthritis   . Cancer (HCC)    hx of prostatae cancer  . Dyspnea    until chest tube placed  . Hypertension   . Mesothelioma [facility])     Past Surgical History: Past Surgical History:  Procedure Laterality Date  . APPENDECTOMY    . BACK SURGERY  1998   disectomy  . CHEST TUBE INSERTION Left 11/13/2019   Procedure: INSERTION PLEURAL DRAINAGE CATHETER;  Surgeon: Grace Isaac, MD;  Location: [facility];  Service: Thoracic;  Laterality: Left;  . EYE SURGERY Left 1965   injury  . IR PORT REPAIR CENTRAL VENOUS ACCESS DEVICE  05/27/2020  . JOINT REPLACEMENT Left 2010   left knee replacement  . KNEE SURGERY    . PLEURAL BIOPSY Left 11/13/2019   Procedure: PLEURAL BIOPSY;  Surgeon: Grace Isaac, MD;  Location: [facility];  Service: Thoracic;  Laterality: Left;  . PORTACATH PLACEMENT Left 11/26/2019   Procedure: INSERTION PORT-A-CATH BARD POWERPORT 9.6 FR CATHETER;  Surgeon: Grace Isaac, MD;  Location: [facility];  Service: Thoracic;  Laterality: Left;  . PROSTATE SURGERY    . REMOVAL OF PLEURAL DRAINAGE CATHETER Left 11/26/2019   Procedure: REMOVAL OF PLEURAL DRAINAGE CATHETER;  Surgeon: Grace Isaac, MD;  Location: [facility];  Service: Thoracic;  Laterality: Left;  . SPINE SURGERY    . TALC PLEURODESIS Left 11/13/2019   Procedure: possible TALC PLEURADESIS;  Surgeon: Grace Isaac, MD;  Location: [facility];  Service: Thoracic;  Laterality: Left;  . TOTAL KNEE ARTHROPLASTY  02/01/2012   Procedure: TOTAL KNEE ARTHROPLASTY;  Surgeon: Sydnee Cabal, MD;  Location: [facility];  Service: Orthopedics;  Laterality: Left;  Marland Kitchen VIDEO ASSISTED THORACOSCOPY Left 11/13/2019   Procedure: VIDEO ASSISTED THORACOSCOPY;  Surgeon: Grace Isaac, MD;  Location: [facility];  Service: Thoracic;  Laterality: Left;  Marland Kitchen VIDEO BRONCHOSCOPY N/A 11/13/2019   Procedure: VIDEO BRONCHOSCOPY;  Surgeon:  Grace Isaac, MD;  Location: [facility] OR;  Service: Thoracic;  Laterality: N/A;    Current Medications: Current Meds  Medication Sig  . acetaminophen (TYLENOL) 500 MG tablet Take 2 tablets (1,000 mg total) by mouth every 6 (six) hours as needed for mild pain or fever. (Patient taking differently: Take 500 mg by mouth every 6 (six) hours as needed for mild pain or fever.)  . aspirin EC 81 MG tablet Take 81 mg by mouth daily. Swallow whole.  . calcium carbonate (TUMS - DOSED IN MG ELEMENTAL CALCIUM) 500 MG chewable tablet Chew 1,000 mg by mouth daily as needed for indigestion or heartburn.  . cetirizine (ZYRTEC) 10 MG tablet Take 10 mg by mouth daily.  Marland Kitchen dexamethasone (DECADRON) 4 MG tablet 1 tablet p.o. twice daily the day before, day of and day after chemotherapy every 3 weeks (Patient taking differently: Take 4 mg by mouth See admin instructions. 4 mg twice daily the day before, day of and day after chemotherapy every 3 weeks)  . fluconazole (DIFLUCAN) 100 MG tablet TAKE 1 TABLET BY MOUTH EVERY DAY (Patient taking differently: Take 100 mg by mouth daily as needed (thrush).)  . folic acid (FOLVITE) 1 MG tablet TAKE 1 TABLET BY MOUTH EVERY DAY (Patient taking differently: Take 1 mg by mouth daily.)  . lidocaine-prilocaine (EMLA) cream Apply to Port-A-Cath site 30-60-minute before treatment. (Patient taking differently: Apply 1 application topically daily as needed (Apply to Port-A-Cath site 30-60-minute before treatment.).)  . losartan-hydrochlorothiazide (HYZAAR) 100-12.5 MG tablet Take 1 tablet by mouth daily.  . metFORMIN (GLUCOPHAGE) 500 MG tablet Take 1 tablet (500 mg total) by mouth 2 (two) times daily with a meal.  . metFORMIN (GLUCOPHAGE-XR) 500 MG 24 hr tablet Take 500 mg by mouth 2 (two) times daily.  . ondansetron (ZOFRAN ODT) 4 MG disintegrating tablet Take 1 tablet (4 mg total) by mouth every 8 (eight) hours as needed for nausea or vomiting. Starting 3 days after chemotherapy  .  prochlorperazine (COMPAZINE) 10 MG tablet TAKE 1 TABLET BY MOUTH EVERY 6 HOURS AS NEEDED FOR NAUSEA OR VOMITING. (Patient taking differently: Take 10 mg by mouth every 6 (six) hours as needed for vomiting or nausea.)  . rosuvastatin (CRESTOR) 20 MG tablet Take 1 tablet (20 mg total) by mouth daily.  . simethicone (MYLICON) 378 MG chewable tablet Chew 125 mg by mouth daily as needed (gas).     Allergies:    Patient has no known allergies.   Social History: Social History   Socioeconomic History  . Marital status: Divorced    Spouse name: Not on file  . Number of children: 2  . Years of education: 49  . Highest education level: Not on file  Occupational History  . 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   Tobacco Use  .  Smoking status: Former Smoker    Years: 15.00    Quit date: 02/15/1981    Years since quitting: 39.3  . Smokeless tobacco: Former Systems developer    Types: [place] date: 12/2018  Vaping Use  . Vaping Use: Never used  Substance and Sexual Activity  . Alcohol use: Yes    Alcohol/week: 3.0 standard drinks    Types: 3 Shots of liquor per week  . Drug use: No  . Sexual activity: Not on file  Other Topics Concern  . Not on file  Social History Narrative  . Not on file   Social Determinants of Health   Financial Resource Strain: Not on file  Food Insecurity: Not on file  Transportation Needs: Not on file  Physical Activity: Not on file  Stress: Not on file  Social Connections: Not on file     Family History: The patient's family history includes Alzheimer's disease in his brother and father; Cancer in his sister; Heart disease in his brother and sister; Hypertension in his sister. There is no history of Colon cancer, Colon polyps, Esophageal cancer, Rectal cancer, or Stomach cancer.  ROS:   All other ROS reviewed and negative. Pertinent positives noted in the HPI.     EKGs/Labs/Other Studies Reviewed:   The following studies were personally  reviewed by me today:  TTE 10/30/2019 1. Left ventricular ejection fraction, by estimation, is 60 to 65%. The  left ventricle has normal function. The left ventricle has no regional  wall motion abnormalities. There is mild concentric left ventricular  hypertrophy. Left ventricular diastolic  parameters are consistent with Grade I diastolic dysfunction (impaired  relaxation).  2. Right ventricular systolic function was not well visualized. The right  ventricular size is not well visualized. Tricuspid regurgitation signal is  inadequate for assessing PA pressure.  3. Large pleural effusion in the left lateral region.  4. The mitral valve is normal in structure. No evidence of mitral valve  regurgitation. No evidence of mitral stenosis.  5. The aortic valve was not well visualized. Aortic valve regurgitation  is not visualized.  6. The inferior vena cava is normal in size with greater than 50%  respiratory variability, suggesting right atrial pressure of 3 mmHg.   [place] 04/23/2020 Impression: 1. Brief ectopic atrial tachycardia episodes detected (3 in 7 days; longest 5.3 seconds).  2. Rare ectopy.     Recent Labs: 10/29/2019: B Natriuretic Peptide 20.4 03/24/2020: TSH 7.480 05/22/2020: ALT 22; BUN 26; Creatinine 1.20; Hemoglobin 11.9; Platelet Count 149; Potassium 4.2; Sodium 139   Recent Lipid Panel    Component Value Date/Time   CHOL 184 09/24/2019 0832   TRIG 94 09/24/2019 0832   HDL 40 09/24/2019 0832   CHOLHDL 4.6 09/24/2019 0832   CHOLHDL 4.1 11/20/2012 1435   VLDL 18 11/20/2012 1435   LDLCALC 127 (H) 09/24/2019 0832    Physical Exam:   VS:  BP 130/81   Pulse 82   Ht 5\' 7"  (1.702 m)   Wt 204 lb (92.5 kg)   SpO2 97%   BMI 31.95 kg/m    Wt Readings from Last 3 Encounters:  05/30/20 204 lb (92.5 kg)  05/27/20 204 lb 2.3 oz (92.6 kg)  05/22/20 204 lb 1.6 oz (92.6 kg)    General: Well nourished, well developed, in no acute distress Head: Atraumatic, normal size   Eyes: PEERLA, EOMI  Neck: Supple, no JVD Endocrine: No thryomegaly Cardiac: Normal S1, S2; RRR; no murmurs, rubs, or  gallops Lungs: Clear to auscultation bilaterally, no wheezing, rhonchi or rales  Abd: Soft, nontender, no hepatomegaly  Ext: No edema, pulses 2+ Musculoskeletal: No deformities, BUE and BLE strength normal and equal Skin: Warm and dry, no rashes   Neuro: Alert and oriented to person, place, time, and situation, CNII-XII grossly intact, no focal deficits  Psych: Normal mood and affect   ASSESSMENT:   AKRAM KISSICK is a 80 y.o. male who presents for the following: 1. Tachycardia   2. RBBB   3. Mixed hyperlipidemia     PLAN:   1. Tachycardia 2. RBBB -Evaluated for sinus tachycardia.  This coincided with starting chemotherapy.  Apparently his regimen is changed.  Heart rate is now in the 80s.  On his monitor he had a heart rate of average value 98 bpm.  He had brief less than 5-second ectopic atrial beats.  He has no symptoms from this.  He denies any chest pain or shortness of breath.  No rapid heartbeat sensation.  His echocardiogram shows normal LV function.  He has known malignant mesothelioma confined to the left pleural space.  He has a chronic pleural effusion from this.  I highly suspect his tachycardia was related to chemotherapy.  Apparently with change in regimen his heart rate has returned to a more acceptable rate.  He also was found to have an elevated TSH but normal T3 and T4 values.  This could be contributing.  I would like for his primary care physician to reevaluate this at some point.  Given his lack of symptoms and normal echocardiogram I see no need for any treatment or further evaluation.  He can walk up to 1.5 miles without any significant symptoms such as chest pain or shortness of breath.  He overall appears to be doing well despite his malignancy.  3. Mixed hyperlipidemia -He is diabetic.  CT scan does show coronary calcifications.  No symptoms of  angina.  We increased his Crestor to 20 mg daily.  I would recommend that he aim for an LDL cholesterol value less than 70.  This can be managed by his primary care physician.  He will see [place] as needed moving forward.  Disposition: Return if symptoms worsen or fail to improve.  Medication Adjustments/Labs and Tests Ordered: Current medicines are reviewed at length with the patient today.  Concerns regarding medicines are outlined above.  No orders of the defined types were placed in this encounter.  No orders of the defined types were placed in this encounter.   Patient Instructions  Medication Instructions:  The current medical regimen is effective;  continue present plan and medications.  *If you need a refill on your cardiac medications before your next appointment, please call your pharmacy*   Follow-Up: At [facility], you and your health needs are our priority.  As part of our continuing mission to provide you with exceptional heart care, we have created designated Provider Care Teams.  These Care Teams include your primary Cardiologist (physician) and Advanced Practice Providers (APPs -  Physician Assistants and Nurse Practitioners) who all work together to provide you with the care you need, when you need it.  We recommend signing up for the patient portal called "MyChart".  Sign up information is provided on this After Visit Summary.  MyChart is used to connect with patients for Virtual Visits (Telemedicine).  Patients are able to view lab/test results, encounter notes, upcoming appointments, etc.  Non-urgent messages can be sent to your provider as  well.   To learn more about what you can do with MyChart, go to [email or web address].    Your next appointment:   As needed  The format for your next appointment:   In Person  Provider:   Eleonore Chiquito, MD         Time Spent with Patient: I have spent a total of 25 minutes with patient reviewing hospital notes,  telemetry, EKGs, labs and examining the patient as well as establishing an assessment and plan that was discussed with the patient.  > 50% of time was spent in direct patient care.  Signed, Addison Naegeli. Audie Box, MD, [place]  [address] [place], [place] [address] [phone]  05/30/2020 8:35 AM

## 2020-05-30 ENCOUNTER — Ambulatory Visit: Payer: PPO | Admitting: Cardiovascular Disease

## 2020-05-30 ENCOUNTER — Other Ambulatory Visit: Payer: Self-pay

## 2020-05-30 ENCOUNTER — Encounter: Payer: Self-pay | Admitting: Cardiovascular Disease

## 2020-05-30 VITALS — BP 130/81 | HR 82 | Ht 67.0 in | Wt 204.0 lb

## 2020-05-30 DIAGNOSIS — E782 Mixed hyperlipidemia: Secondary | ICD-10-CM

## 2020-05-30 DIAGNOSIS — R000 Tachycardia, unspecified: Secondary | ICD-10-CM

## 2020-05-30 DIAGNOSIS — I4510 Unspecified right bundle-branch block: Secondary | ICD-10-CM

## 2020-05-30 NOTE — Patient Instructions (Signed)
Medication Instructions:  The current medical regimen is effective;  continue present plan and medications.  *If you need a refill on your cardiac medications before your next appointment, please call your pharmacy*    Follow-Up: At [facility], you and your health needs are our priority.  As part of our continuing mission to provide you with exceptional heart care, we have created designated Provider Care Teams.  These Care Teams include your primary Cardiologist (physician) and Advanced Practice Providers (APPs -  Physician Assistants and Nurse Practitioners) who all work together to provide you with the care you need, when you need it.  We recommend signing up for the patient portal called "MyChart".  Sign up information is provided on this After Visit Summary.  MyChart is used to connect with patients for Virtual Visits (Telemedicine).  Patients are able to view lab/test results, encounter notes, upcoming appointments, etc.  Non-urgent messages can be sent to your provider as well.   To learn more about what you can do with MyChart, go to [email or web address].    Your next appointment:   As needed  The format for your next appointment:   In Person  Provider:   [place] O'Neal, MD      

## 2020-06-02 DIAGNOSIS — H40023 Open angle with borderline findings, high risk, bilateral: Secondary | ICD-10-CM | POA: Diagnosis not present

## 2020-06-09 ENCOUNTER — Other Ambulatory Visit: Payer: Self-pay

## 2020-06-09 ENCOUNTER — Ambulatory Visit (HOSPITAL_COMMUNITY)
Admission: RE | Admit: 2020-06-09 | Discharge: 2020-06-09 | Disposition: A | Discharge status: Home / Self Care | Payer: PPO | Source: Ambulatory Visit | Attending: Internal Medicine | Admitting: Internal Medicine

## 2020-06-09 DIAGNOSIS — M13841 Other specified arthritis, right hand: Secondary | ICD-10-CM | POA: Diagnosis not present

## 2020-06-09 DIAGNOSIS — I2510 Atherosclerotic heart disease of native coronary artery without angina pectoris: Secondary | ICD-10-CM | POA: Diagnosis not present

## 2020-06-09 DIAGNOSIS — C3490 Malignant neoplasm of unspecified part of unspecified bronchus or lung: Secondary | ICD-10-CM | POA: Diagnosis not present

## 2020-06-09 DIAGNOSIS — C450 Mesothelioma of pleura: Secondary | ICD-10-CM | POA: Diagnosis not present

## 2020-06-09 DIAGNOSIS — M5134 Other intervertebral disc degeneration, thoracic region: Secondary | ICD-10-CM | POA: Diagnosis not present

## 2020-06-09 DIAGNOSIS — J929 Pleural plaque without asbestos: Secondary | ICD-10-CM | POA: Diagnosis not present

## 2020-06-09 MED ORDER — IOHEXOL 300 MG/ML  SOLN
75.0000 mL | Freq: Once | INTRAMUSCULAR | Status: AC | PRN
  Administered 2020-06-09: 75 mL via INTRAVENOUS

## 2020-06-11 ENCOUNTER — Encounter: Payer: Self-pay | Admitting: Emergency Medicine

## 2020-06-11 ENCOUNTER — Other Ambulatory Visit: Payer: Self-pay

## 2020-06-11 ENCOUNTER — Ambulatory Visit (INDEPENDENT_AMBULATORY_CARE_PROVIDER_SITE_OTHER): Payer: PPO | Admitting: Emergency Medicine

## 2020-06-11 VITALS — BP 126/70 | HR 80 | Temp 98.3°F | Ht 67.0 in | Wt 205.0 lb

## 2020-06-11 DIAGNOSIS — E1165 Type 2 diabetes mellitus with hyperglycemia: Secondary | ICD-10-CM | POA: Diagnosis not present

## 2020-06-11 DIAGNOSIS — I10 Essential (primary) hypertension: Secondary | ICD-10-CM

## 2020-06-11 DIAGNOSIS — Z8546 Personal history of malignant neoplasm of prostate: Secondary | ICD-10-CM

## 2020-06-11 DIAGNOSIS — I700 Atherosclerosis of aorta: Secondary | ICD-10-CM

## 2020-06-11 DIAGNOSIS — I2510 Atherosclerotic heart disease of native coronary artery without angina pectoris: Secondary | ICD-10-CM

## 2020-06-11 DIAGNOSIS — C457 Mesothelioma of other sites: Secondary | ICD-10-CM

## 2020-06-11 NOTE — Progress Notes (Signed)
Jack Haynes 80 y.o.   Chief Complaint  Patient presents with  . chronic medical problem    Follow up 3 months  . Hypertension    Follow up  Recent cardiology visit this month as follows: ASSESSMENT:   Jack Haynes is a 80 y.o. male who presents for the following: 1. Tachycardia   2. RBBB   3. Mixed hyperlipidemia       PLAN:   1. Tachycardia 2. RBBB -Evaluated for sinus tachycardia.  This coincided with starting chemotherapy.  Apparently his regimen is changed.  Heart rate is now in the 80s.  On his monitor he had a heart rate of average value 98 bpm.  He had brief less than 5-second ectopic atrial beats.  He has no symptoms from this.  He denies any chest pain or shortness of breath.  No rapid heartbeat sensation.  His echocardiogram shows normal LV function.  He has known malignant mesothelioma confined to the left pleural space.  He has a chronic pleural effusion from this.  I highly suspect his tachycardia was related to chemotherapy.  Apparently with change in regimen his heart rate has returned to a more acceptable rate.  He also was found to have an elevated TSH but normal T3 and T4 values.  This could be contributing.  I would like for his primary care physician to reevaluate this at some point.  Given his lack of symptoms and normal echocardiogram I see no need for any treatment or further evaluation.  He can walk up to 1.5 miles without any significant symptoms such as chest pain or shortness of breath.  He overall appears to be doing well despite his malignancy.   3. Mixed hyperlipidemia -He is diabetic.  CT scan does show coronary calcifications.  No symptoms of angina.  We increased his Crestor to 20 mg daily.  I would recommend that he aim for an LDL cholesterol value less than 70.  This can be managed by his primary care physician.  He will see [place] as needed moving forward.   Disposition: Return if symptoms worsen or fail to improve.   HISTORY OF PRESENT  ILLNESS: This is a 80 y.o. male with history of mesothelioma left lung presently on chemotherapy.  Doing well.  Tolerating chemotherapy well without complications.  Also has a history of hypertension on Hyzaar 100-12.5 mg. History of dyslipidemia on rosuvastatin 20 mg daily. Lab Results  Component Value Date   HGBA1C 7.6 (A) 03/13/2020  History of diabetes on metformin 500 mg twice a day.  Most recent visit with oncologist as follows: DIAGNOSIS: Stage I/IImalignant pleural mesothelioma involving the left hemithorax diagnosed in September 2021.  PRIOR THERAPY: None.  CURRENT THERAPY: Systemic chemotherapy with carboplatin for AUC of 5, Alimta 500 mg/M2 and Avastin 15 mg/KG every 3 weeks.  Status post 8 cycles.  Starting from cycle #7 the patient will be on treatment with maintenance Avastin 15 mg/KG every 3 weeks.  INTERVAL HISTORY: Jack Haynes 80 y.o. male returns to the clinic today for follow-up visit accompanied by his wife.  The patient is feeling fine today with no concerning complaints.  He continues to tolerate his maintenance treatment with Alimta and Avastin fairly well.  He denied having any current chest pain, shortness of breath, cough or hemoptysis.  He denied having any fever or chills.  He has no nausea, vomiting, diarrhea or constipation.  He has no headache or visual changes.  He is here today for evaluation  before starting cycle #9 of his treatment. ASSESSMENT AND PLAN: This is a very pleasant 80 years old white male recently diagnosed with malignant pleural mesothelioma involving the left hemithorax. The patient is currently undergoing systemic chemotherapy with carboplatin for AUC of 5, Alimta 500 mg/M2 and Avastin 15 mg/KG every 3 weeks.  Status post 8 cycles.  Starting from cycle #7 the patient will be on maintenance treatment with Avastin 15 mg/KG every 3 weeks. The patient continues to tolerate this treatment well with no concerning adverse effects. I recommended  for him to proceed with cycle #9 today as planned. I will see him back for follow-up visit in 3 weeks for evaluation before starting cycle #10 with repeat CT scan of the chest for restaging of his disease. He was advised to call immediately if he has any concerning symptoms in the interval. The patient voices understanding of current disease status and treatment options and is in agreement with the current care plan.  All questions were answered. The patient knows to call the clinic with any problems, questions or concerns. We can certainly see the patient much sooner if necessary.  Recent CT scan of chest done last Monday as follows: IMPRESSION: 1. No significant interval change of circumferential pleural thickening with nodularity overlying the left lung compatible with known mesothelioma. 2. No new or progressive disease identified. 3. Coronary artery calcifications noted. 4. Aortic atherosclerosis.   Aortic Atherosclerosis (ICD10-I70.0).     Electronically Signed   By: Kerby Moors M.D.   On: 06/09/2020 16:54  HPI   Prior to Admission medications   Medication Sig Start Date End Date Taking? Authorizing Provider  acetaminophen (TYLENOL) 500 MG tablet Take 2 tablets (1,000 mg total) by mouth every 6 (six) hours as needed for mild pain or fever. Patient taking differently: Take 500 mg by mouth every 6 (six) hours as needed for mild pain or fever. 11/15/19  Yes Barrett, Lodema Hong, PA-C  aspirin EC 81 MG tablet Take 81 mg by mouth daily. Swallow whole.   Yes [provider]  calcium carbonate (TUMS - DOSED IN MG ELEMENTAL CALCIUM) 500 MG chewable tablet Chew 1,000 mg by mouth daily as needed for indigestion or heartburn.   Yes [provider]  cetirizine (ZYRTEC) 10 MG tablet Take 10 mg by mouth daily.   Yes [provider]  dexamethasone (DECADRON) 4 MG tablet 1 tablet p.o. twice daily the day before, day of and day after chemotherapy every 3 weeks Patient  taking differently: Take 4 mg by mouth See admin instructions. 4 mg twice daily the day before, day of and day after chemotherapy every 3 weeks 11/23/19  Yes Curt Bears, MD  fluconazole (DIFLUCAN) 100 MG tablet TAKE 1 TABLET BY MOUTH EVERY DAY Patient taking differently: Take 100 mg by mouth daily as needed (thrush). 03/04/20  Yes Heilingoetter, Cassandra L, PA-C  folic acid (FOLVITE) 1 MG tablet TAKE 1 TABLET BY MOUTH EVERY DAY Patient taking differently: Take 1 mg by mouth daily. 02/18/20  Yes Curt Bears, MD  lidocaine-prilocaine (EMLA) cream Apply to Port-A-Cath site 30-60-minute before treatment. Patient taking differently: Apply 1 application topically daily as needed (Apply to Port-A-Cath site 30-60-minute before treatment.). 11/23/19  Yes Curt Bears, MD  losartan-hydrochlorothiazide [facility]) 100-12.5 MG tablet Take 1 tablet by mouth daily. 01/15/20  Yes [name], MD  metFORMIN (GLUCOPHAGE) 500 MG tablet Take 1 tablet (500 mg total) by mouth 2 (two) times daily with a meal. 03/13/20  Yes [place],  Ines Bloomer, MD  ondansetron (ZOFRAN ODT) 4 MG disintegrating tablet Take 1 tablet (4 mg total) by mouth every 8 (eight) hours as needed for nausea or vomiting. Starting 3 days after chemotherapy 02/28/20  Yes Heilingoetter, Cassandra L, PA-C  prochlorperazine (COMPAZINE) 10 MG tablet TAKE 1 TABLET BY MOUTH EVERY 6 HOURS AS NEEDED FOR NAUSEA OR VOMITING. Patient taking differently: Take 10 mg by mouth every 6 (six) hours as needed for vomiting or nausea. 04/28/20  Yes Curt Bears, MD  rosuvastatin (CRESTOR) 20 MG tablet Take 1 tablet (20 mg total) by mouth daily. 03/24/20  Yes O'Neal, Cassie Freer, MD  simethicone (MYLICON) 157 MG chewable tablet Chew 125 mg by mouth daily as needed (gas).   Yes [provider]  metFORMIN (GLUCOPHAGE-XR) 500 MG 24 hr tablet Take 500 mg by mouth 2 (two) times daily. Patient not taking: Reported on 06/11/2020    [provider]     Not on File  Patient Active Problem List   Diagnosis Date Noted  . Hypertension 02/28/2020  . Port-A-Cath in place 01/09/2020  . Mesothelioma of left lung ([place]) 11/23/2019  . Encounter for antineoplastic chemotherapy 11/23/2019  . S/P Left VATS drainage of pleural effusion, pleural biopsy, placement of Pleur-x catheter 11/15/2019  . Atherosclerotic cardiovascular disease 10/29/2019  . Pleural effusion 10/29/2019  . History of prostate cancer 10/29/2019  . Essential hypertension 10/09/2019  . Abnormal EKG 01/02/2015  . Prostate cancer ([place]) 11/01/2011    Past Medical History:  Diagnosis Date  . Arthritis   . Cancer (HCC)    hx of prostatae cancer  . Dyspnea    until chest tube placed  . Hypertension   . Mesothelioma [facility])     Past Surgical History:  Procedure Laterality Date  . APPENDECTOMY    . BACK SURGERY  1998   disectomy  . CHEST TUBE INSERTION Left 11/13/2019   Procedure: INSERTION PLEURAL DRAINAGE CATHETER;  Surgeon: Grace Isaac, MD;  Location: [place];  Service: Thoracic;  Laterality: Left;  . EYE SURGERY Left 1965   injury  . IR PORT REPAIR CENTRAL VENOUS ACCESS DEVICE  05/27/2020  . JOINT REPLACEMENT Left 2010   left knee replacement  . KNEE SURGERY    . PLEURAL BIOPSY Left 11/13/2019   Procedure: PLEURAL BIOPSY;  Surgeon: Grace Isaac, MD;  Location: [place];  Service: Thoracic;  Laterality: Left;  . PORTACATH PLACEMENT Left 11/26/2019   Procedure: INSERTION PORT-A-CATH BARD POWERPORT 9.6 FR CATHETER;  Surgeon: Grace Isaac, MD;  Location: [place];  Service: Thoracic;  Laterality: Left;  . PROSTATE SURGERY    . REMOVAL OF PLEURAL DRAINAGE CATHETER Left 11/26/2019   Procedure: REMOVAL OF PLEURAL DRAINAGE CATHETER;  Surgeon: Grace Isaac, MD;  Location: [place];  Service: Thoracic;  Laterality: Left;  . SPINE SURGERY    . TALC PLEURODESIS Left 11/13/2019   Procedure: possible TALC PLEURADESIS;  Surgeon: Grace Isaac, MD;  Location: [place];  Service: Thoracic;  Laterality: Left;  . TOTAL KNEE ARTHROPLASTY  02/01/2012   Procedure: TOTAL KNEE ARTHROPLASTY;  Surgeon: Sydnee Cabal, MD;  Location: [facility];  Service: Orthopedics;  Laterality: Left;  Marland Kitchen VIDEO ASSISTED THORACOSCOPY Left 11/13/2019   Procedure: VIDEO ASSISTED THORACOSCOPY;  Surgeon: Grace Isaac, MD;  Location: [place];  Service: Thoracic;  Laterality: Left;  Marland Kitchen VIDEO BRONCHOSCOPY N/A 11/13/2019   Procedure: VIDEO BRONCHOSCOPY;  Surgeon: Grace Isaac, MD;  Location: [place];  Service: Thoracic;  Laterality:  N/A;    Social History   Socioeconomic History  . Marital status: Divorced    Spouse name: Not on file  . Number of children: 2  . Years of education: 42  . Highest education level: Not on file  Occupational History  . 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   Tobacco Use  . Smoking status: Former Smoker    Years: 15.00    Quit date: 02/15/1981    Years since quitting: 39.3  . Smokeless tobacco: Former Systems developer    Types: [place] date: 12/2018  Vaping Use  . Vaping Use: Never used  Substance and Sexual Activity  . Alcohol use: Yes    Alcohol/week: 3.0 standard drinks    Types: 3 Shots of liquor per week  . Drug use: No  . Sexual activity: Not on file  Other Topics Concern  . Not on file  Social History Narrative  . Not on file   Social Determinants of Health   Financial Resource Strain: Not on file  Food Insecurity: Not on file  Transportation Needs: Not on file  Physical Activity: Not on file  Stress: Not on file  Social Connections: Not on file  Intimate Partner Violence: Not on file    Family History  Problem Relation Age of Onset  . Cancer Sister        brain tumor  . Heart disease Brother   . Alzheimer's disease Brother   . Heart disease Sister   . Alzheimer's disease Father   . Hypertension Sister   . Colon cancer Neg Hx   . Colon polyps Neg Hx   . Esophageal cancer Neg Hx   . Rectal cancer Neg Hx    . Stomach cancer Neg Hx      Review of Systems  Constitutional: Negative.  Negative for fever.  HENT: Positive for hearing loss.   Respiratory: Negative.  Negative for cough and shortness of breath.   Cardiovascular: Positive for palpitations. Negative for chest pain.  Gastrointestinal: Negative.  Negative for abdominal pain, diarrhea, nausea and vomiting.  Genitourinary: Negative.  Negative for dysuria and hematuria.  Skin: Negative.   Neurological: Negative.  Negative for dizziness and headaches.  All other systems reviewed and are negative.  Today's Vitals   06/11/20 0928  BP: 126/70  Pulse: 80  Temp: 98.3 F (36.8 C)  TempSrc: Oral  SpO2: 97%  Weight: 205 lb (93 kg)  Height: 5\' 7"  (1.702 m)   Body mass index is 32.11 kg/m.   Physical Exam Vitals reviewed.  Constitutional:      Appearance: Normal appearance.  HENT:     Head: Normocephalic.  Eyes:     Extraocular Movements: Extraocular movements intact.     Pupils: Pupils are equal, round, and reactive to light.  Cardiovascular:     Rate and Rhythm: Normal rate and regular rhythm.     Pulses: Normal pulses.     Heart sounds: Normal heart sounds.  Pulmonary:     Effort: Pulmonary effort is normal.     Breath sounds: Normal breath sounds.  Musculoskeletal:     Cervical back: Normal range of motion and neck supple.     Right lower leg: No edema.     Left lower leg: No edema.  Skin:    General: Skin is warm and dry.     Capillary Refill: Capillary refill takes less than 2 seconds.  Neurological:     General: No focal  deficit present.     Mental Status: He is alert and oriented to person, place, and time.  Psychiatric:        Mood and Affect: Mood normal.        Behavior: Behavior normal.      ASSESSMENT & PLAN: A total of 30 minutes was spent with the patient and counseling/coordination of care regarding multiple chronic medical problems, review of all medications, review of most recent cardiologist and  oncologist office visit notes, review of most recent CT scan of chest done, education on nutrition, review of most recent blood work results, prognosis, documentation, need for follow-up.  Essential hypertension Well-controlled hypertension.  Continue Hyzaar 100-12.5 mg daily. Diet and nutrition discussed.  Mesothelioma of left lung ([place]) Stable and tolerating chemotherapy well. No complications.  Type 2 diabetes mellitus with hyperglycemia, without long-term current use of insulin (HCC) Continue metformin 500 mg twice a day.  Diet and nutrition discussed. Recheck with A1c in 3 months.  Atherosclerotic cardiovascular disease Assessed.  Continue rosuvastatin 20 mg daily. Diet and nutrition discussed.  Jack Haynes was seen today for chronic medical problem and hypertension.  Diagnoses and all orders for this visit:  Mesothelioma of left lung ([place])  Aortic atherosclerosis ([place])  Atherosclerotic cardiovascular disease  Essential hypertension  Type 2 diabetes mellitus with hyperglycemia, without long-term current use of insulin ([place])  History of prostate cancer    Patient Instructions   Health Maintenance After Age 73 After age 45, you are at a higher risk for certain long-term diseases and infections as well as injuries from falls. Falls are a major cause of broken bones and head injuries in people who are older than age 9. Getting regular preventive care can help to keep you healthy and well. Preventive care includes getting regular testing and making lifestyle changes as recommended by your health care provider. Talk with your health care provider about:  Which screenings and tests you should have. A screening is a test that checks for a disease when you have no symptoms.  A diet and exercise plan that is right for you. What should I know about screenings and tests to prevent falls? Screening and testing are the best ways to find a health problem early. Early diagnosis and  treatment give you the best chance of managing medical conditions that are common after age 59. Certain conditions and lifestyle choices may make you more likely to have a fall. Your health care provider may recommend:  Regular vision checks. Poor vision and conditions such as cataracts can make you more likely to have a fall. If you wear glasses, make sure to get your prescription updated if your vision changes.  Medicine review. Work with your health care provider to regularly review all of the medicines you are taking, including over-the-counter medicines. Ask your health care provider about any side effects that may make you more likely to have a fall. Tell your health care provider if any medicines that you take make you feel dizzy or sleepy.  Osteoporosis screening. Osteoporosis is a condition that causes the bones to get weaker. This can make the bones weak and cause them to break more easily.  Blood pressure screening. Blood pressure changes and medicines to control blood pressure can make you feel dizzy.  Strength and balance checks. Your health care provider may recommend certain tests to check your strength and balance while standing, walking, or changing positions.  Foot health exam. Foot pain and numbness, as well as not wearing  proper footwear, can make you more likely to have a fall.  Depression screening. You may be more likely to have a fall if you have a fear of falling, feel emotionally low, or feel unable to do activities that you used to do.  Alcohol use screening. Using too much alcohol can affect your balance and may make you more likely to have a fall. What actions can I take to lower my risk of falls? General instructions  Talk with your health care provider about your risks for falling. Tell your health care provider if: ? You fall. Be sure to tell your health care provider about all falls, even ones that seem minor. ? You feel dizzy, sleepy, or off-balance.  Take  over-the-counter and prescription medicines only as told by your health care provider. These include any supplements.  Eat a healthy diet and maintain a healthy weight. A healthy diet includes low-fat dairy products, low-fat (lean) meats, and fiber from whole grains, beans, and lots of fruits and vegetables. Home safety  Remove any tripping hazards, such as rugs, cords, and clutter.  Install safety equipment such as grab bars in bathrooms and safety rails on stairs.  Keep rooms and walkways well-lit. Activity  Follow a regular exercise program to stay fit. This will help you maintain your balance. Ask your health care provider what types of exercise are appropriate for you.  If you need a cane or walker, use it as recommended by your health care provider.  Wear supportive shoes that have nonskid soles.   Lifestyle  Do not drink alcohol if your health care provider tells you not to drink.  If you drink alcohol, limit how much you have: ? 0-1 drink a day for women. ? 0-2 drinks a day for men.  Be aware of how much alcohol is in your drink. In the [place] one drink equals one typical bottle of beer (12 oz), one-half glass of wine (5 oz), or one shot of hard liquor (1 oz).  Do not use any products that contain nicotine or tobacco, such as cigarettes and e-cigarettes. If you need help quitting, ask your health care provider. Summary  Having a healthy lifestyle and getting preventive care can help to protect your health and wellness after age 72.  Screening and testing are the best way to find a health problem early and help you avoid having a fall. Early diagnosis and treatment give you the best chance for managing medical conditions that are more common for people who are older than age 88.  Falls are a major cause of broken bones and head injuries in people who are older than age 91. Take precautions to prevent a fall at home.  Work with your health care provider to learn what changes  you can make to improve your health and wellness and to prevent falls. This information is not intended to replace advice given to you by your health care provider. Make sure you discuss any questions you have with your health care provider. Document Revised: 05/25/2018 Document Reviewed: 12/15/2016 Elsevier Patient Education  2021 [place], MD [facility]

## 2020-06-11 NOTE — Assessment & Plan Note (Signed)
Assessed.  Continue rosuvastatin 20 mg daily. Diet and nutrition discussed.

## 2020-06-11 NOTE — Patient Instructions (Signed)
Health Maintenance After Age 80 After age 80, you are at a higher risk for certain long-term diseases and infections as well as injuries from falls. Falls are a major cause of broken bones and head injuries in people who are older than age 80. Getting regular preventive care can help to keep you healthy and well. Preventive care includes getting regular testing and making lifestyle changes as recommended by your health care provider. Talk with your health care provider about:  Which screenings and tests you should have. A screening is a test that checks for a disease when you have no symptoms.  A diet and exercise plan that is right for you. What should I know about screenings and tests to prevent falls? Screening and testing are the best ways to find a health problem early. Early diagnosis and treatment give you the best chance of managing medical conditions that are common after age 80. Certain conditions and lifestyle choices may make you more likely to have a fall. Your health care provider may recommend:  Regular vision checks. Poor vision and conditions such as cataracts can make you more likely to have a fall. If you wear glasses, make sure to get your prescription updated if your vision changes.  Medicine review. Work with your health care provider to regularly review all of the medicines you are taking, including over-the-counter medicines. Ask your health care provider about any side effects that may make you more likely to have a fall. Tell your health care provider if any medicines that you take make you feel dizzy or sleepy.  Osteoporosis screening. Osteoporosis is a condition that causes the bones to get weaker. This can make the bones weak and cause them to break more easily.  Blood pressure screening. Blood pressure changes and medicines to control blood pressure can make you feel dizzy.  Strength and balance checks. Your health care provider may recommend certain tests to check your  strength and balance while standing, walking, or changing positions.  Foot health exam. Foot pain and numbness, as well as not wearing proper footwear, can make you more likely to have a fall.  Depression screening. You may be more likely to have a fall if you have a fear of falling, feel emotionally low, or feel unable to do activities that you used to do.  Alcohol use screening. Using too much alcohol can affect your balance and may make you more likely to have a fall. What actions can I take to lower my risk of falls? General instructions  Talk with your health care provider about your risks for falling. Tell your health care provider if: ? You fall. Be sure to tell your health care provider about all falls, even ones that seem minor. ? You feel dizzy, sleepy, or off-balance.  Take over-the-counter and prescription medicines only as told by your health care provider. These include any supplements.  Eat a healthy diet and maintain a healthy weight. A healthy diet includes low-fat dairy products, low-fat (lean) meats, and fiber from whole grains, beans, and lots of fruits and vegetables. Home safety  Remove any tripping hazards, such as rugs, cords, and clutter.  Install safety equipment such as grab bars in bathrooms and safety rails on stairs.  Keep rooms and walkways well-lit. Activity  Follow a regular exercise program to stay fit. This will help you maintain your balance. Ask your health care provider what types of exercise are appropriate for you.  If you need a cane or walker,   use it as recommended by your health care provider.  Wear supportive shoes that have nonskid soles.   Lifestyle  Do not drink alcohol if your health care provider tells you not to drink.  If you drink alcohol, limit how much you have: ? 0-1 drink a day for women. ? 0-2 drinks a day for men.  Be aware of how much alcohol is in your drink. In the [place] one drink equals one typical bottle of beer (12  oz), one-half glass of wine (5 oz), or one shot of hard liquor (1 oz).  Do not use any products that contain nicotine or tobacco, such as cigarettes and e-cigarettes. If you need help quitting, ask your health care provider. Summary  Having a healthy lifestyle and getting preventive care can help to protect your health and wellness after age 80.  Screening and testing are the best way to find a health problem early and help you avoid having a fall. Early diagnosis and treatment give you the best chance for managing medical conditions that are more common for people who are older than age 80.  Falls are a major cause of broken bones and head injuries in people who are older than age 80. Take precautions to prevent a fall at home.  Work with your health care provider to learn what changes you can make to improve your health and wellness and to prevent falls. This information is not intended to replace advice given to you by your health care provider. Make sure you discuss any questions you have with your health care provider. Document Revised: 05/25/2018 Document Reviewed: 12/15/2016 Elsevier Patient Education  2021 Elsevier Inc.  

## 2020-06-11 NOTE — Assessment & Plan Note (Addendum)
Continue metformin 500 mg twice a day.  Diet and nutrition discussed. Recheck with A1c in 3 months.

## 2020-06-11 NOTE — Assessment & Plan Note (Signed)
Stable and tolerating chemotherapy well. No complications.

## 2020-06-11 NOTE — Assessment & Plan Note (Signed)
Well-controlled hypertension.  Continue Hyzaar 100-12.5 mg daily. Diet and nutrition discussed.

## 2020-06-12 ENCOUNTER — Inpatient Hospital Stay: Payer: PPO

## 2020-06-12 ENCOUNTER — Encounter: Payer: Self-pay | Admitting: Internal Medicine

## 2020-06-12 ENCOUNTER — Inpatient Hospital Stay: Payer: PPO | Admitting: Internal Medicine

## 2020-06-12 VITALS — BP 143/71 | HR 84 | Temp 97.8°F | Resp 20 | Ht 67.0 in | Wt 206.0 lb

## 2020-06-12 DIAGNOSIS — C457 Mesothelioma of other sites: Secondary | ICD-10-CM

## 2020-06-12 DIAGNOSIS — Z5111 Encounter for antineoplastic chemotherapy: Secondary | ICD-10-CM | POA: Diagnosis not present

## 2020-06-12 DIAGNOSIS — I10 Essential (primary) hypertension: Secondary | ICD-10-CM

## 2020-06-12 DIAGNOSIS — Z95828 Presence of other vascular implants and grafts: Secondary | ICD-10-CM

## 2020-06-12 LAB — CMP (CANCER CENTER ONLY)
ALT: 13 U/L (ref 0–44)
AST: 14 U/L — ABNORMAL LOW (ref 15–41)
Albumin: 3.6 g/dL (ref 3.5–5.0)
Alkaline Phosphatase: 64 U/L (ref 38–126)
Anion gap: 13 (ref 5–15)
BUN: 23 mg/dL (ref 8–23)
CO2: 22 mmol/L (ref 22–32)
Calcium: 8.8 mg/dL — ABNORMAL LOW (ref 8.9–10.3)
Chloride: 102 mmol/L (ref 98–111)
Creatinine: 1.06 mg/dL (ref 0.61–1.24)
GFR, Estimated: >60 mL/min (ref >60)
Glucose, Bld: 293 mg/dL — ABNORMAL HIGH (ref 70–99)
Potassium: 4.4 mmol/L (ref 3.5–5.1)
Sodium: 137 mmol/L (ref 135–145)
Total Bilirubin: 0.4 mg/dL (ref 0.3–1.2)
Total Protein: 6.6 g/dL (ref 6.5–8.1)

## 2020-06-12 LAB — CBC WITH DIFFERENTIAL (CANCER CENTER ONLY)
Abs Immature Granulocytes: 0.03 10*3/uL (ref 0.00–0.07)
Basophils Absolute: 0 10*3/uL (ref 0.0–0.1)
Basophils Relative: 0 %
Eosinophils Absolute: 0 10*3/uL (ref 0.0–0.5)
Eosinophils Relative: 0 %
HCT: 33.7 % — ABNORMAL LOW (ref 39.0–52.0)
Hemoglobin: 11.8 g/dL — ABNORMAL LOW (ref 13.0–17.0)
Immature Granulocytes: 0 %
Lymphocytes Relative: 10 %
Lymphs Abs: 0.9 10*3/uL (ref 0.7–4.0)
MCH: 32.3 pg (ref 26.0–34.0)
MCHC: 35 g/dL (ref 30.0–36.0)
MCV: 92.3 fL (ref 80.0–100.0)
Monocytes Absolute: 0.8 10*3/uL (ref 0.1–1.0)
Monocytes Relative: 9 %
Neutro Abs: 7.1 10*3/uL (ref 1.7–7.7)
Neutrophils Relative %: 81 %
Platelet Count: 145 10*3/uL — ABNORMAL LOW (ref 150–400)
RBC: 3.65 MIL/uL — ABNORMAL LOW (ref 4.22–5.81)
RDW: 11.9 % (ref 11.5–15.5)
WBC Count: 8.8 10*3/uL (ref 4.0–10.5)
nRBC: 0 % (ref 0.0–0.2)

## 2020-06-12 LAB — TOTAL PROTEIN, URINE DIPSTICK: Protein, ur: NEGATIVE mg/dL

## 2020-06-12 MED ORDER — SODIUM CHLORIDE 0.9% FLUSH
10.0000 mL | Freq: Once | INTRAVENOUS | Status: AC
  Administered 2020-06-12: 10 mL
  Filled 2020-06-12: qty 10

## 2020-06-12 MED ORDER — SODIUM CHLORIDE 0.9 % IV SOLN
Freq: Once | INTRAVENOUS | Status: AC
  Filled 2020-06-12: qty 250

## 2020-06-12 MED ORDER — SODIUM CHLORIDE 0.9 % IV SOLN
15.0000 mg/kg | Freq: Once | INTRAVENOUS | Status: AC
  Administered 2020-06-12: 1300 mg via INTRAVENOUS
  Filled 2020-06-12: qty 48

## 2020-06-12 MED ORDER — HEPARIN SOD (PORK) LOCK FLUSH 100 UNIT/ML IV SOLN
500.0000 [IU] | Freq: Once | INTRAVENOUS | Status: AC | PRN
  Administered 2020-06-12: 500 [IU]
  Filled 2020-06-12: qty 5

## 2020-06-12 MED ORDER — SODIUM CHLORIDE 0.9% FLUSH
10.0000 mL | INTRAVENOUS | Status: DC | PRN
  Administered 2020-06-12: 10 mL
  Filled 2020-06-12: qty 10

## 2020-06-12 NOTE — Patient Instructions (Signed)
[facility]  Discharge Instructions: Thank you for choosing [facility] to provide your oncology and hematology care.   If you have a lab appointment with the [facility], please go directly to the [facility] and check in at the registration area.   Wear comfortable clothing and clothing appropriate for easy access to any Portacath or PICC line.   We strive to give you quality time with your provider. You may need to reschedule your appointment if you arrive late (15 or more minutes).  Arriving late affects you and other patients whose appointments are after yours.  Also, if you miss three or more appointments without notifying the office, you may be dismissed from the clinic at the provider's discretion.      For prescription refill requests, have your pharmacy contact our office and allow 72 hours for refills to be completed.    Today you received the following chemotherapy and/or immunotherapy agents [id number]     To help prevent nausea and vomiting after your treatment, we encourage you to take your nausea medication as directed.  BELOW ARE SYMPTOMS THAT SHOULD BE REPORTED IMMEDIATELY: . *FEVER GREATER THAN 100.4 F (38 C) OR HIGHER . *CHILLS OR SWEATING . *NAUSEA AND VOMITING THAT IS NOT CONTROLLED WITH YOUR NAUSEA MEDICATION . *UNUSUAL SHORTNESS OF BREATH . *UNUSUAL BRUISING OR BLEEDING . *URINARY PROBLEMS (pain or burning when urinating, or frequent urination) . *BOWEL PROBLEMS (unusual diarrhea, constipation, pain near the anus) . TENDERNESS IN MOUTH AND THROAT WITH OR WITHOUT PRESENCE OF ULCERS (sore throat, sores in mouth, or a toothache) . UNUSUAL RASH, SWELLING OR PAIN  . UNUSUAL VAGINAL DISCHARGE OR ITCHING   Items with * indicate a potential emergency and should be followed up as soon as possible or go to the Emergency Department if any problems should occur.  Please show the CHEMOTHERAPY ALERT CARD or IMMUNOTHERAPY ALERT CARD  at check-in to the Emergency Department and triage nurse.  Should you have questions after your visit or need to cancel or reschedule your appointment, please contact [facility]  Dept: [phone]  and follow the prompts.  Office hours are 8:00 a.m. to 4:30 p.m. Monday - Friday. Please note that voicemails left after 4:00 p.m. may not be returned until the following business day.  We are closed weekends and major holidays. You have access to a nurse at all times for urgent questions. Please call the main number to the clinic Dept: [phone] and follow the prompts.   For any non-urgent questions, you may also contact your provider using MyChart. We now offer e-Visits for anyone 56 and older to request care online for non-urgent symptoms. For details visit [email or web address].   Also download the MyChart app! Go to the app store, search "MyChart", open the app, select [facility], and log in with your MyChart username and password.  Due to Covid, a mask is required upon entering the hospital/clinic. If you do not have a mask, one will be given to you upon arrival. For doctor visits, patients may have 1 support person aged 59 or older with them. For treatment visits, patients cannot have anyone with them due to current Covid guidelines and our immunocompromised population.

## 2020-06-12 NOTE — Patient Instructions (Signed)
Implanted Port Insertion, Care After This sheet gives you information about how to care for yourself after your procedure. Your health care provider may also give you more specific instructions. If you have problems or questions, contact your health care provider. What can I expect after the procedure? After the procedure, it is common to have:  Discomfort at the port insertion site.  Bruising on the skin over the port. This should improve over 3-4 days. Follow these instructions at home: Port care  After your port is placed, you will get a manufacturer's information card. The card has information about your port. Keep this card with you at all times.  Take care of the port as told by your health care provider. Ask your health care provider if you or a family member can get training for taking care of the port at home. A home health care nurse may also take care of the port.  Make sure to remember what type of port you have. Incision care  Follow instructions from your health care provider about how to take care of your port insertion site. Make sure you: ? Wash your hands with soap and water before and after you change your bandage (dressing). If soap and water are not available, use hand sanitizer. ? Change your dressing as told by your health care provider. ? Leave stitches (sutures), skin glue, or adhesive strips in place. These skin closures may need to stay in place for 2 weeks or longer. If adhesive strip edges start to loosen and curl up, you may trim the loose edges. Do not remove adhesive strips completely unless your health care provider tells you to do that.  Check your port insertion site every day for signs of infection. Check for: ? Redness, swelling, or pain. ? Fluid or blood. ? Warmth. ? Pus or a bad smell.      Activity  Return to your normal activities as told by your health care provider. Ask your health care provider what activities are safe for you.  Do not  lift anything that is heavier than 10 lb (4.5 kg), or the limit that you are told, until your health care provider says that it is safe. General instructions  Take over-the-counter and prescription medicines only as told by your health care provider.  Do not take baths, swim, or use a hot tub until your health care provider approves. Ask your health care provider if you may take showers. You may only be allowed to take sponge baths.  Do not drive for 24 hours if you were given a sedative during your procedure.  Wear a medical alert bracelet in case of an emergency. This will tell any health care providers that you have a port.  Keep all follow-up visits as told by your health care provider. This is important. Contact a health care provider if:  You cannot flush your port with saline as directed, or you cannot draw blood from the port.  You have a fever or chills.  You have redness, swelling, or pain around your port insertion site.  You have fluid or blood coming from your port insertion site.  Your port insertion site feels warm to the touch.  You have pus or a bad smell coming from the port insertion site. Get help right away if:  You have chest pain or shortness of breath.  You have bleeding from your port that you cannot control. Summary  Take care of the port as told by your   health care provider. Keep the manufacturer's information card with you at all times.  Change your dressing as told by your health care provider.  Contact a health care provider if you have a fever or chills or if you have redness, swelling, or pain around your port insertion site.  Keep all follow-up visits as told by your health care provider. This information is not intended to replace advice given to you by your health care provider. Make sure you discuss any questions you have with your health care provider. Document Revised: 08/30/2017 Document Reviewed: 08/30/2017 Elsevier Patient Education   2021 [employer] Inc.  

## 2020-06-12 NOTE — Progress Notes (Signed)
[facility] [phone]   [phone]  OFFICE PROGRESS NOTE  [name], MD [place] [address]  DIAGNOSIS: Stage I/II malignant pleural mesothelioma involving the left hemithorax diagnosed in September 2021.  PRIOR THERAPY: None.  CURRENT THERAPY:  Systemic chemotherapy with carboplatin for AUC of 5, Alimta 500 mg/M2 and Avastin 15 mg/KG every 3 weeks.  Status post 9 cycles.  Starting from cycle #7 the patient will be on treatment with maintenance Avastin 15 mg/KG every 3 weeks.  INTERVAL HISTORY: Jack Haynes 80 y.o. male returns to the clinic today for follow-up visit accompanied by his wife.  The patient is feeling fine today with no concerning complaints.  He denied having any current chest pain, shortness of breath, cough or hemoptysis.  He has no nausea, vomiting, diarrhea or constipation.  He has no headache or visual changes.  He has no weight loss or night sweats.  He continues to tolerate his maintenance treatment with  Avastin fairly well.  He had repeat CT scan of the chest performed recently and he is here for evaluation and discussion of his scan results.  MEDICAL HISTORY: Past Medical History:  Diagnosis Date  . Arthritis   . Cancer (HCC)    hx of prostatae cancer  . Dyspnea    until chest tube placed  . Hypertension   . Mesothelioma [facility])     ALLERGIES:  has no allergies on file.  MEDICATIONS:  Current Outpatient Medications  Medication Sig Dispense Refill  . acetaminophen (TYLENOL) 500 MG tablet Take 2 tablets (1,000 mg total) by mouth every 6 (six) hours as needed for mild pain or fever. (Patient taking differently: Take 500 mg by mouth every 6 (six) hours as needed for mild pain or fever.) 30 tablet 0  . aspirin EC 81 MG tablet Take 81 mg by mouth daily. Swallow whole.    . calcium carbonate (TUMS - DOSED IN MG ELEMENTAL CALCIUM) 500 MG chewable tablet Chew 1,000 mg by mouth daily as needed  for indigestion or heartburn.    . cetirizine (ZYRTEC) 10 MG tablet Take 10 mg by mouth daily.    Marland Kitchen dexamethasone (DECADRON) 4 MG tablet 1 tablet p.o. twice daily the day before, day of and day after chemotherapy every 3 weeks (Patient taking differently: Take 4 mg by mouth See admin instructions. 4 mg twice daily the day before, day of and day after chemotherapy every 3 weeks) 40 tablet 1  . fluconazole (DIFLUCAN) 100 MG tablet TAKE 1 TABLET BY MOUTH EVERY DAY (Patient taking differently: Take 100 mg by mouth daily as needed (thrush).) 7 tablet 0  . folic acid (FOLVITE) 1 MG tablet TAKE 1 TABLET BY MOUTH EVERY DAY (Patient taking differently: Take 1 mg by mouth daily.) 90 tablet 1  . lidocaine-prilocaine (EMLA) cream Apply to Port-A-Cath site 30-60-minute before treatment. (Patient taking differently: Apply 1 application topically daily as needed (Apply to Port-A-Cath site 30-60-minute before treatment.).) 30 g 0  . losartan-hydrochlorothiazide (HYZAAR) 100-12.5 MG tablet Take 1 tablet by mouth daily. 90 tablet 3  . metFORMIN (GLUCOPHAGE) 500 MG tablet Take 1 tablet (500 mg total) by mouth 2 (two) times daily with a meal. 180 tablet 3  . metFORMIN (GLUCOPHAGE-XR) 500 MG 24 hr tablet Take 500 mg by mouth 2 (two) times daily. (Patient not taking: Reported on 06/11/2020)    . ondansetron (ZOFRAN ODT) 4 MG disintegrating tablet Take 1 tablet (4 mg total)  by mouth every 8 (eight) hours as needed for nausea or vomiting. Starting 3 days after chemotherapy 30 tablet 1  . prochlorperazine (COMPAZINE) 10 MG tablet TAKE 1 TABLET BY MOUTH EVERY 6 HOURS AS NEEDED FOR NAUSEA OR VOMITING. (Patient taking differently: Take 10 mg by mouth every 6 (six) hours as needed for vomiting or nausea.) 30 tablet 0  . rosuvastatin (CRESTOR) 20 MG tablet Take 1 tablet (20 mg total) by mouth daily. 90 tablet 1  . simethicone (MYLICON) 517 MG chewable tablet Chew 125 mg by mouth daily as needed (gas).     No current  facility-administered medications for this visit.    SURGICAL HISTORY:  Past Surgical History:  Procedure Laterality Date  . APPENDECTOMY    . BACK SURGERY  1998   disectomy  . CHEST TUBE INSERTION Left 11/13/2019   Procedure: INSERTION PLEURAL DRAINAGE CATHETER;  Surgeon: Grace Isaac, MD;  Location: [facility];  Service: Thoracic;  Laterality: Left;  . EYE SURGERY Left 1965   injury  . IR PORT REPAIR CENTRAL VENOUS ACCESS DEVICE  05/27/2020  . JOINT REPLACEMENT Left 2010   left knee replacement  . KNEE SURGERY    . PLEURAL BIOPSY Left 11/13/2019   Procedure: PLEURAL BIOPSY;  Surgeon: Grace Isaac, MD;  Location: [facility];  Service: Thoracic;  Laterality: Left;  . PORTACATH PLACEMENT Left 11/26/2019   Procedure: INSERTION PORT-A-CATH BARD POWERPORT 9.6 FR CATHETER;  Surgeon: Grace Isaac, MD;  Location: [facility];  Service: Thoracic;  Laterality: Left;  . PROSTATE SURGERY    . REMOVAL OF PLEURAL DRAINAGE CATHETER Left 11/26/2019   Procedure: REMOVAL OF PLEURAL DRAINAGE CATHETER;  Surgeon: Grace Isaac, MD;  Location: [facility];  Service: Thoracic;  Laterality: Left;  . SPINE SURGERY    . TALC PLEURODESIS Left 11/13/2019   Procedure: possible TALC PLEURADESIS;  Surgeon: Grace Isaac, MD;  Location: [facility];  Service: Thoracic;  Laterality: Left;  . TOTAL KNEE ARTHROPLASTY  02/01/2012   Procedure: TOTAL KNEE ARTHROPLASTY;  Surgeon: Sydnee Cabal, MD;  Location: [facility];  Service: Orthopedics;  Laterality: Left;  Marland Kitchen VIDEO ASSISTED THORACOSCOPY Left 11/13/2019   Procedure: VIDEO ASSISTED THORACOSCOPY;  Surgeon: Grace Isaac, MD;  Location: [facility];  Service: Thoracic;  Laterality: Left;  Marland Kitchen VIDEO BRONCHOSCOPY N/A 11/13/2019   Procedure: VIDEO BRONCHOSCOPY;  Surgeon: Grace Isaac, MD;  Location: [facility];  Service: Thoracic;  Laterality: N/A;    REVIEW OF SYSTEMS:  Constitutional: negative Eyes: negative Ears, nose, mouth, throat, and face: negative Respiratory:  negative Cardiovascular: negative Gastrointestinal: negative Genitourinary:negative Integument/breast: negative Hematologic/lymphatic: negative Musculoskeletal:negative Neurological: negative Behavioral/Psych: negative Endocrine: negative Allergic/Immunologic: negative   PHYSICAL EXAMINATION: General appearance: alert, cooperative and no distress Head: Normocephalic, without obvious abnormality, atraumatic Neck: no adenopathy, no JVD, supple, symmetrical, trachea midline and thyroid not enlarged, symmetric, no tenderness/mass/nodules Lymph nodes: Cervical, supraclavicular, and axillary nodes normal. Resp: clear to auscultation bilaterally Back: symmetric, no curvature. ROM normal. No CVA tenderness. Cardio: regular rate and rhythm, S1, S2 normal, no murmur, click, rub or gallop GI: soft, non-tender; bowel sounds normal; no masses,  no organomegaly Extremities: extremities normal, atraumatic, no cyanosis or edema Neurologic: Alert and oriented X 3, normal strength and tone. Normal symmetric reflexes. Normal coordination and gait  ECOG PERFORMANCE STATUS: 0 - Asymptomatic  Blood pressure (!) 143/71, pulse 84, temperature 97.8 F (36.6 C), temperature source Tympanic, resp. rate 20, height 5\' 7"  (1.702 m), weight 206 lb (93.4 kg), SpO2  99 %.  LABORATORY DATA: Lab Results  Component Value Date   WBC 8.8 06/12/2020   HGB 11.8 (L) 06/12/2020   HCT 33.7 (L) 06/12/2020   MCV 92.3 06/12/2020   PLT 145 (L) 06/12/2020      Chemistry      Component Value Date/Time   NA 139 05/22/2020 0749   NA 141 09/24/2019 0832   K 4.2 05/22/2020 0749   CL 103 05/22/2020 0749   CO2 21 (L) 05/22/2020 0749   BUN 26 (H) 05/22/2020 0749   BUN 17 09/24/2019 0832   CREATININE 1.20 05/22/2020 0749   CREATININE 1.01 12/24/2014 0916      Component Value Date/Time   CALCIUM 8.6 (L) 05/22/2020 0749   ALKPHOS 60 05/22/2020 0749   AST 21 05/22/2020 0749   ALT 22 05/22/2020 0749   BILITOT 0.5  05/22/2020 0749       RADIOGRAPHIC STUDIES: CT Chest W Contrast  Result Date: 06/09/2020 CLINICAL DATA:  Restaging malignant mesothelioma of the left hemithorax. EXAM: CT CHEST WITH CONTRAST TECHNIQUE: Multidetector CT imaging of the chest was performed during intravenous contrast administration. CONTRAST:  58mL OMNIPAQUE IOHEXOL 300 MG/ML  SOLN COMPARISON:  03/31/2020 FINDINGS: Cardiovascular: Left chest wall port a catheter is noted with tip terminating just above the cavoatrial junction. Stable mild cardiac enlargement. No significant pericardial effusion. Aortic atherosclerosis. Coronary artery calcifications. Mediastinum/Nodes: No enlarged mediastinal, hilar, or axillary lymph nodes. Thyroid gland, trachea, and esophagus demonstrate no significant findings. Lungs/Pleura: Circumferential pleural thickening in the left chest with associated nodularity is again noted. Imaging findings are compatible with known mesothelioma. Representative measurements of the left lung pleura as follows: -Nodular focal area within the left posteromedial chest measures 1.7 x 2.2 cm, image 106/2. Previously this measured 2.0 x 2.4 cm. -nodular thickening along the lateral left base measures 1 cm in thickness, image 84/6. This is compared with 0.9 cm previously. -posterioromedial pleural thickening measures 1.3 cm, image 125/6. Measured at the same level this measured 1.4 cm. -pleural thickening over low lying the medial aspect of the left apex measures 0.6 cm, image 103/6. Unchanged. No suspicious nodule or mass within the right hemithorax. Upper Abdomen: No acute findings. Musculoskeletal: Degenerative disc disease identified within the thoracic spine. No acute or suspicious osseous findings. IMPRESSION: 1. No significant interval change of circumferential pleural thickening with nodularity overlying the left lung compatible with known mesothelioma. 2. No new or progressive disease identified. 3. Coronary artery  calcifications noted. 4. Aortic atherosclerosis. Aortic Atherosclerosis (ICD10-I70.0). Electronically Signed   By: Kerby Moors M.D.   On: 06/09/2020 16:54   IR Port Repair Central Venous Access Device  Result Date: 05/27/2020 CLINICAL DATA:  Malignant mesothelioma. Port catheter will not aspirate consistently. CT demonstrates catheter tip position at the confluence of brachiocephalic veins. Port a cath tip needs to be repositioned to cavo atrial jx EXAM: TUNNELED PORT CATHETER REVISION WITH ULTRASOUND AND FLUOROSCOPIC GUIDANCE FLUOROSCOPY TIME:  54 seconds; 7 mGy ANESTHESIA/SEDATION: Intravenous Fentanyl 76mcg and Versed 1.5mg  were administered as conscious sedation during continuous monitoring of the patient's level of consciousness and physiological / cardiorespiratory status by the radiology RN, with a total moderate sedation time of 28 minutes. TECHNIQUE: The procedure, risks, benefits, and alternatives were explained to the patient. Questions regarding the procedure were encouraged and answered. The patient understands and consents to the procedure. Patency of the left IJ vein was confirmed with ultrasound with image documentation. An appropriate skin site was determined. Skin site was marked. Region was  prepped using maximum barrier technique including cap and mask, sterile gown, sterile gloves, large sterile sheet, and Chlorhexidine as cutaneous antisepsis. The region was infiltrated locally with 1% lidocaine. Under real-time ultrasound guidance, the left IJ vein was accessed with a 21 gauge micropuncture needle; the needle tip within the vein was confirmed with ultrasound image documentation. Needle was exchanged over a 018 guidewire for transitional dilator, and vascular measurement was performed. Incision made over the port scar from previous left subclavian port placement and the previous device was removed intact including solitary retention suture. A new power-injectable port was positioned and  its catheter tunneled to the left IJ dermatotomy site. The transitional dilator was exchanged over an Amplatz wire for a peel-away sheath, through which the port catheter, which had been trimmed to the appropriate length, was advanced and positioned under fluoroscopy with its tip in the mid SVC . Spot chest radiograph confirms good catheter position and no pneumothorax. The port was secured with 2 ethylon retention sutures. The port was flushed per protocol. The pocket was closed with deep interrupted and subcuticular continuous 3-0 Monocryl sutures. The incisions were covered with Dermabond then covered with a sterile dressing. The patient tolerated the procedure well. COMPLICATIONS: COMPLICATIONS None immediate IMPRESSION: Technically successful left port catheter revision with power-injectable IJ device. Ready for routine use. Electronically Signed   By: Lucrezia Europe M.D.   On: 05/27/2020 15:35    ASSESSMENT AND PLAN: This is a very pleasant 80 years old white male recently diagnosed with malignant pleural mesothelioma involving the left hemithorax. The patient is currently undergoing systemic chemotherapy with carboplatin for AUC of 5, Alimta 500 mg/M2 and Avastin 15 mg/KG every 3 weeks.  Status post 9 cycles.  Starting from cycle #7 the patient will be on maintenance treatment with Avastin 15 mg/KG every 3 weeks. The patient has no tolerability issues to this treatment. He had repeat CT scan of the chest performed recently.  I personally and independently reviewed the scans and discussed the results with the patient and his wife today. His scan showed no concerning findings for disease progression. I recommended for him to continue his current treatment with maintenance Avastin and he will proceed with cycle #10 today. The patient will come back for follow-up visit in 3 weeks for evaluation before the next cycle of his treatment. He was advised to call immediately if he has any concerning symptoms in  the interval.  The patient voices understanding of current disease status and treatment options and is in agreement with the current care plan.  All questions were answered. The patient knows to call the clinic with any problems, questions or concerns. We can certainly see the patient much sooner if necessary.   Disclaimer: This note was dictated with voice recognition software. Similar sounding words can inadvertently be transcribed and may not be corrected upon review.

## 2020-06-16 DIAGNOSIS — M25531 Pain in right wrist: Secondary | ICD-10-CM | POA: Diagnosis not present

## 2020-06-16 DIAGNOSIS — M65831 Other synovitis and tenosynovitis, right forearm: Secondary | ICD-10-CM | POA: Diagnosis not present

## 2020-07-03 ENCOUNTER — Inpatient Hospital Stay: Payer: PPO

## 2020-07-03 ENCOUNTER — Other Ambulatory Visit: Payer: Self-pay

## 2020-07-03 ENCOUNTER — Inpatient Hospital Stay: Payer: PPO | Admitting: Internal Medicine

## 2020-07-03 ENCOUNTER — Other Ambulatory Visit: Payer: PPO

## 2020-07-03 ENCOUNTER — Inpatient Hospital Stay: Payer: PPO | Attending: Internal Medicine

## 2020-07-03 ENCOUNTER — Ambulatory Visit: Payer: Self-pay | Admitting: Emergency Medicine

## 2020-07-03 VITALS — BP 126/77 | HR 83 | Temp 97.6°F | Resp 20 | Ht 67.0 in | Wt 204.9 lb

## 2020-07-03 VITALS — BP 129/77

## 2020-07-03 DIAGNOSIS — I700 Atherosclerosis of aorta: Secondary | ICD-10-CM | POA: Insufficient documentation

## 2020-07-03 DIAGNOSIS — C457 Mesothelioma of other sites: Secondary | ICD-10-CM

## 2020-07-03 DIAGNOSIS — R5383 Other fatigue: Secondary | ICD-10-CM | POA: Insufficient documentation

## 2020-07-03 DIAGNOSIS — Z7982 Long term (current) use of aspirin: Secondary | ICD-10-CM | POA: Insufficient documentation

## 2020-07-03 DIAGNOSIS — Z5111 Encounter for antineoplastic chemotherapy: Secondary | ICD-10-CM | POA: Diagnosis not present

## 2020-07-03 DIAGNOSIS — R0602 Shortness of breath: Secondary | ICD-10-CM | POA: Diagnosis not present

## 2020-07-03 DIAGNOSIS — M129 Arthropathy, unspecified: Secondary | ICD-10-CM | POA: Insufficient documentation

## 2020-07-03 DIAGNOSIS — I10 Essential (primary) hypertension: Secondary | ICD-10-CM

## 2020-07-03 DIAGNOSIS — Z7984 Long term (current) use of oral hypoglycemic drugs: Secondary | ICD-10-CM | POA: Diagnosis not present

## 2020-07-03 DIAGNOSIS — Z79899 Other long term (current) drug therapy: Secondary | ICD-10-CM | POA: Diagnosis not present

## 2020-07-03 DIAGNOSIS — Z95828 Presence of other vascular implants and grafts: Secondary | ICD-10-CM

## 2020-07-03 DIAGNOSIS — Z8546 Personal history of malignant neoplasm of prostate: Secondary | ICD-10-CM | POA: Insufficient documentation

## 2020-07-03 DIAGNOSIS — C450 Mesothelioma of pleura: Secondary | ICD-10-CM | POA: Diagnosis not present

## 2020-07-03 LAB — CBC WITH DIFFERENTIAL (CANCER CENTER ONLY)
Abs Immature Granulocytes: 0.04 10*3/uL (ref 0.00–0.07)
Basophils Absolute: 0 10*3/uL (ref 0.0–0.1)
Basophils Relative: 0 %
Eosinophils Absolute: 0 10*3/uL (ref 0.0–0.5)
Eosinophils Relative: 0 %
HCT: 35.3 % — ABNORMAL LOW (ref 39.0–52.0)
Hemoglobin: 12.5 g/dL — ABNORMAL LOW (ref 13.0–17.0)
Immature Granulocytes: 0 %
Lymphocytes Relative: 8 %
Lymphs Abs: 0.9 10*3/uL (ref 0.7–4.0)
MCH: 31.8 pg (ref 26.0–34.0)
MCHC: 35.4 g/dL (ref 30.0–36.0)
MCV: 89.8 fL (ref 80.0–100.0)
Monocytes Absolute: 0.6 10*3/uL (ref 0.1–1.0)
Monocytes Relative: 6 %
Neutro Abs: 9.6 10*3/uL — ABNORMAL HIGH (ref 1.7–7.7)
Neutrophils Relative %: 86 %
Platelet Count: 146 10*3/uL — ABNORMAL LOW (ref 150–400)
RBC: 3.93 MIL/uL — ABNORMAL LOW (ref 4.22–5.81)
RDW: 12.1 % (ref 11.5–15.5)
WBC Count: 11.2 10*3/uL — ABNORMAL HIGH (ref 4.0–10.5)
nRBC: 0 % (ref 0.0–0.2)

## 2020-07-03 LAB — CMP (CANCER CENTER ONLY)
ALT: 18 U/L (ref 0–44)
AST: 15 U/L (ref 15–41)
Albumin: 3.7 g/dL (ref 3.5–5.0)
Alkaline Phosphatase: 60 U/L (ref 38–126)
Anion gap: 10 (ref 5–15)
BUN: 21 mg/dL (ref 8–23)
CO2: 24 mmol/L (ref 22–32)
Calcium: 9.3 mg/dL (ref 8.9–10.3)
Chloride: 101 mmol/L (ref 98–111)
Creatinine: 1.15 mg/dL (ref 0.61–1.24)
GFR, Estimated: >60 mL/min (ref >60)
Glucose, Bld: 267 mg/dL — ABNORMAL HIGH (ref 70–99)
Potassium: 4.4 mmol/L (ref 3.5–5.1)
Sodium: 135 mmol/L (ref 135–145)
Total Bilirubin: 0.4 mg/dL (ref 0.3–1.2)
Total Protein: 6.7 g/dL (ref 6.5–8.1)

## 2020-07-03 LAB — TOTAL PROTEIN, URINE DIPSTICK: Protein, ur: 30 mg/dL — AB

## 2020-07-03 MED ORDER — SODIUM CHLORIDE 0.9% FLUSH
10.0000 mL | INTRAVENOUS | Status: DC | PRN
  Administered 2020-07-03: 10 mL
  Filled 2020-07-03: qty 10

## 2020-07-03 MED ORDER — SODIUM CHLORIDE 0.9 % IV SOLN
15.0000 mg/kg | Freq: Once | INTRAVENOUS | Status: AC
  Administered 2020-07-03: 1300 mg via INTRAVENOUS
  Filled 2020-07-03: qty 48

## 2020-07-03 MED ORDER — HEPARIN SOD (PORK) LOCK FLUSH 100 UNIT/ML IV SOLN
500.0000 [IU] | Freq: Once | INTRAVENOUS | Status: AC | PRN
  Administered 2020-07-03: 500 [IU]
  Filled 2020-07-03: qty 5

## 2020-07-03 MED ORDER — SODIUM CHLORIDE 0.9% FLUSH
10.0000 mL | Freq: Once | INTRAVENOUS | Status: AC
Start: 2020-07-03 — End: 2020-07-03
  Administered 2020-07-03: 10 mL
  Filled 2020-07-03: qty 10

## 2020-07-03 MED ORDER — SODIUM CHLORIDE 0.9 % IV SOLN
Freq: Once | INTRAVENOUS | Status: AC
Start: 2020-07-03 — End: 2020-07-03
  Filled 2020-07-03: qty 250

## 2020-07-03 NOTE — Progress Notes (Signed)
[place] [phone]   [phone]  OFFICE PROGRESS NOTE  [name], MD [place] [address]  DIAGNOSIS: Stage I/II malignant pleural mesothelioma involving the left hemithorax diagnosed in September 2021.  PRIOR THERAPY: None.  CURRENT THERAPY:  Systemic chemotherapy with carboplatin for AUC of 5, Alimta 500 mg/M2 and Avastin 15 mg/KG every 3 weeks.  Status post 10 cycles.  Starting from cycle #7 the patient will be on treatment with maintenance Avastin 15 mg/KG every 3 weeks.  INTERVAL HISTORY: Jack Haynes 80 y.o. male returns to the clinic today for follow-up visit accompanied by his girlfriend.  The patient is feeling fine today with no concerning complaints except for mild fatigue after the last cycle of his treatment.  He denied having any current chest pain, shortness of breath, cough or hemoptysis.  He denied having any nausea, vomiting, diarrhea or constipation.  He has no headache or visual changes.  He has no weight loss or night sweats.  He is here today for evaluation before starting cycle #11.  MEDICAL HISTORY: Past Medical History:  Diagnosis Date  . Arthritis   . Cancer (HCC)    hx of prostatae cancer  . Dyspnea    until chest tube placed  . Hypertension   . Mesothelioma [facility])     ALLERGIES:  has no allergies on file.  MEDICATIONS:  Current Outpatient Medications  Medication Sig Dispense Refill  . acetaminophen (TYLENOL) 500 MG tablet Take 2 tablets (1,000 mg total) by mouth every 6 (six) hours as needed for mild pain or fever. (Patient taking differently: Take 500 mg by mouth every 6 (six) hours as needed for mild pain or fever.) 30 tablet 0  . aspirin EC 81 MG tablet Take 81 mg by mouth daily. Swallow whole.    . calcium carbonate (TUMS - DOSED IN MG ELEMENTAL CALCIUM) 500 MG chewable tablet Chew 1,000 mg by mouth daily as needed for indigestion or heartburn.    . cetirizine (ZYRTEC) 10 MG  tablet Take 10 mg by mouth daily.    Marland Kitchen dexamethasone (DECADRON) 4 MG tablet 1 tablet p.o. twice daily the day before, day of and day after chemotherapy every 3 weeks (Patient taking differently: Take 4 mg by mouth See admin instructions. 4 mg twice daily the day before, day of and day after chemotherapy every 3 weeks) 40 tablet 1  . fluconazole (DIFLUCAN) 100 MG tablet TAKE 1 TABLET BY MOUTH EVERY DAY (Patient taking differently: Take 100 mg by mouth daily as needed (thrush).) 7 tablet 0  . folic acid (FOLVITE) 1 MG tablet TAKE 1 TABLET BY MOUTH EVERY DAY (Patient taking differently: Take 1 mg by mouth daily.) 90 tablet 1  . lidocaine-prilocaine (EMLA) cream Apply to Port-A-Cath site 30-60-minute before treatment. (Patient taking differently: Apply 1 application topically daily as needed (Apply to Port-A-Cath site 30-60-minute before treatment.).) 30 g 0  . losartan-hydrochlorothiazide (HYZAAR) 100-12.5 MG tablet Take 1 tablet by mouth daily. 90 tablet 3  . metFORMIN (GLUCOPHAGE) 500 MG tablet Take 1 tablet (500 mg total) by mouth 2 (two) times daily with a meal. 180 tablet 3  . ondansetron (ZOFRAN ODT) 4 MG disintegrating tablet Take 1 tablet (4 mg total) by mouth every 8 (eight) hours as needed for nausea or vomiting. Starting 3 days after chemotherapy 30 tablet 1  . prochlorperazine (COMPAZINE) 10 MG tablet TAKE 1 TABLET BY MOUTH EVERY 6 HOURS AS NEEDED FOR NAUSEA  OR VOMITING. (Patient taking differently: Take 10 mg by mouth every 6 (six) hours as needed for vomiting or nausea.) 30 tablet 0  . rosuvastatin (CRESTOR) 20 MG tablet Take 1 tablet (20 mg total) by mouth daily. 90 tablet 1  . simethicone (MYLICON) 947 MG chewable tablet Chew 125 mg by mouth daily as needed (gas).     No current facility-administered medications for this visit.    SURGICAL HISTORY:  Past Surgical History:  Procedure Laterality Date  . APPENDECTOMY    . BACK SURGERY  1998   disectomy  . CHEST TUBE INSERTION Left  11/13/2019   Procedure: INSERTION PLEURAL DRAINAGE CATHETER;  Surgeon: Grace Isaac, MD;  Location: [place];  Service: Thoracic;  Laterality: Left;  . EYE SURGERY Left 1965   injury  . IR PORT REPAIR CENTRAL VENOUS ACCESS DEVICE  05/27/2020  . JOINT REPLACEMENT Left 2010   left knee replacement  . KNEE SURGERY    . PLEURAL BIOPSY Left 11/13/2019   Procedure: PLEURAL BIOPSY;  Surgeon: Grace Isaac, MD;  Location: [place];  Service: Thoracic;  Laterality: Left;  . PORTACATH PLACEMENT Left 11/26/2019   Procedure: INSERTION PORT-A-CATH BARD POWERPORT 9.6 FR CATHETER;  Surgeon: Grace Isaac, MD;  Location: [place];  Service: Thoracic;  Laterality: Left;  . PROSTATE SURGERY    . REMOVAL OF PLEURAL DRAINAGE CATHETER Left 11/26/2019   Procedure: REMOVAL OF PLEURAL DRAINAGE CATHETER;  Surgeon: Grace Isaac, MD;  Location: [place];  Service: Thoracic;  Laterality: Left;  . SPINE SURGERY    . TALC PLEURODESIS Left 11/13/2019   Procedure: possible TALC PLEURADESIS;  Surgeon: Grace Isaac, MD;  Location: [place];  Service: Thoracic;  Laterality: Left;  . TOTAL KNEE ARTHROPLASTY  02/01/2012   Procedure: TOTAL KNEE ARTHROPLASTY;  Surgeon: Sydnee Cabal, MD;  Location[facility];  Service: Orthopedics;  Laterality: Left;  Marland Kitchen VIDEO ASSISTED THORACOSCOPY Left 11/13/2019   Procedure: VIDEO ASSISTED THORACOSCOPY;  Surgeon: Grace Isaac, MD;  Location: [place];  Service: Thoracic;  Laterality: Left;  Marland Kitchen VIDEO BRONCHOSCOPY N/A 11/13/2019   Procedure: VIDEO BRONCHOSCOPY;  Surgeon: Grace Isaac, MD;  Location: [facility];  Service: Thoracic;  Laterality: N/A;    REVIEW OF SYSTEMS:  A comprehensive review of systems was negative except for: Constitutional: positive for fatigue   PHYSICAL EXAMINATION: General appearance: alert, cooperative, fatigued and no distress Head: Normocephalic, without obvious abnormality, atraumatic Neck: no adenopathy, no JVD, supple, symmetrical, trachea midline and  thyroid not enlarged, symmetric, no tenderness/mass/nodules Lymph nodes: Cervical, supraclavicular, and axillary nodes normal. Resp: clear to auscultation bilaterally Back: symmetric, no curvature. ROM normal. No CVA tenderness. Cardio: regular rate and rhythm, S1, S2 normal, no murmur, click, rub or gallop GI: soft, non-tender; bowel sounds normal; no masses,  no organomegaly Extremities: extremities normal, atraumatic, no cyanosis or edema  ECOG PERFORMANCE STATUS: 0 - Asymptomatic  Blood pressure 126/77, pulse 83, temperature 97.6 F (36.4 C), temperature source Tympanic, resp. rate 20, height 5\' 7"  (1.702 m), weight 204 lb 14.4 oz (92.9 kg), SpO2 98 %.  LABORATORY DATA: Lab Results  Component Value Date   WBC 11.2 (H) 07/03/2020   HGB 12.5 (L) 07/03/2020   HCT 35.3 (L) 07/03/2020   MCV 89.8 07/03/2020   PLT 146 (L) 07/03/2020      Chemistry      Component Value Date/Time   NA 135 07/03/2020 0909   NA 141 09/24/2019 0832   K 4.4 07/03/2020 0909  CL 101 07/03/2020 0909   CO2 24 07/03/2020 0909   BUN 21 07/03/2020 0909   BUN 17 09/24/2019 0832   CREATININE 1.15 07/03/2020 0909   CREATININE 1.01 12/24/2014 0916      Component Value Date/Time   CALCIUM 9.3 07/03/2020 0909   ALKPHOS 60 07/03/2020 0909   AST 15 07/03/2020 0909   ALT 18 07/03/2020 0909   BILITOT 0.4 07/03/2020 0909       RADIOGRAPHIC STUDIES: CT Chest W Contrast  Result Date: 06/09/2020 CLINICAL DATA:  Restaging malignant mesothelioma of the left hemithorax. EXAM: CT CHEST WITH CONTRAST TECHNIQUE: Multidetector CT imaging of the chest was performed during intravenous contrast administration. CONTRAST:  50mL OMNIPAQUE IOHEXOL 300 MG/ML  SOLN COMPARISON:  03/31/2020 FINDINGS: Cardiovascular: Left chest wall port a catheter is noted with tip terminating just above the cavoatrial junction. Stable mild cardiac enlargement. No significant pericardial effusion. Aortic atherosclerosis. Coronary artery  calcifications. Mediastinum/Nodes: No enlarged mediastinal, hilar, or axillary lymph nodes. Thyroid gland, trachea, and esophagus demonstrate no significant findings. Lungs/Pleura: Circumferential pleural thickening in the left chest with associated nodularity is again noted. Imaging findings are compatible with known mesothelioma. Representative measurements of the left lung pleura as follows: -Nodular focal area within the left posteromedial chest measures 1.7 x 2.2 cm, image 106/2. Previously this measured 2.0 x 2.4 cm. -nodular thickening along the lateral left base measures 1 cm in thickness, image 84/6. This is compared with 0.9 cm previously. -posterioromedial pleural thickening measures 1.3 cm, image 125/6. Measured at the same level this measured 1.4 cm. -pleural thickening over low lying the medial aspect of the left apex measures 0.6 cm, image 103/6. Unchanged. No suspicious nodule or mass within the right hemithorax. Upper Abdomen: No acute findings. Musculoskeletal: Degenerative disc disease identified within the thoracic spine. No acute or suspicious osseous findings. IMPRESSION: 1. No significant interval change of circumferential pleural thickening with nodularity overlying the left lung compatible with known mesothelioma. 2. No new or progressive disease identified. 3. Coronary artery calcifications noted. 4. Aortic atherosclerosis. Aortic Atherosclerosis (ICD10-I70.0). Electronically Signed   By: Kerby Moors M.D.   On: 06/09/2020 16:54    ASSESSMENT AND PLAN: This is a very pleasant 80 years old white male recently diagnosed with malignant pleural mesothelioma involving the left hemithorax. The patient is currently undergoing systemic chemotherapy with carboplatin for AUC of 5, Alimta 500 mg/M2 and Avastin 15 mg/KG every 3 weeks.  Status post 10 cycles.  Starting from cycle #7 the patient will be on maintenance treatment with Avastin 15 mg/KG every 3 weeks. The patient has been tolerating  this treatment well with no concerning adverse effect except for mild fatigue. I recommended for him to proceed with cycle #11 today as planned. I will see him back for follow-up visit in 3 weeks for evaluation before the next cycle of his treatment. He was advised to call immediately if he has any concerning symptoms in the interval. The patient voices understanding of current disease status and treatment options and is in agreement with the current care plan.  All questions were answered. The patient knows to call the clinic with any problems, questions or concerns. We can certainly see the patient much sooner if necessary.   Disclaimer: This note was dictated with voice recognition software. Similar sounding words can inadvertently be transcribed and may not be corrected upon review.

## 2020-07-03 NOTE — Patient Instructions (Signed)
[facility]  Discharge Instructions: °Thank you for choosing [facility] to provide your oncology and hematology care.  ° °If you have a lab appointment with the Cancer Center, please go directly to the Cancer Center and check in at the registration area. °  °Wear comfortable clothing and clothing appropriate for easy access to any Portacath or PICC line.  ° °We strive to give you quality time with your provider. You may need to reschedule your appointment if you arrive late (15 or more minutes).  Arriving late affects you and other patients whose appointments are after yours.  Also, if you miss three or more appointments without notifying the office, you may be dismissed from the clinic at the provider’s discretion.    °  °For prescription refill requests, have your pharmacy contact our office and allow 72 hours for refills to be completed.   ° °Today you received the following chemotherapy and/or immunotherapy agents: Bevacizumab.     °  °To help prevent nausea and vomiting after your treatment, we encourage you to take your nausea medication as directed. ° °BELOW ARE SYMPTOMS THAT SHOULD BE REPORTED IMMEDIATELY: °*FEVER GREATER THAN 100.4 F (38 °C) OR HIGHER °*CHILLS OR SWEATING °*NAUSEA AND VOMITING THAT IS NOT CONTROLLED WITH YOUR NAUSEA MEDICATION °*UNUSUAL SHORTNESS OF BREATH °*UNUSUAL BRUISING OR BLEEDING °*URINARY PROBLEMS (pain or burning when urinating, or frequent urination) °*BOWEL PROBLEMS (unusual diarrhea, constipation, pain near the anus) °TENDERNESS IN MOUTH AND THROAT WITH OR WITHOUT PRESENCE OF ULCERS (sore throat, sores in mouth, or a toothache) °UNUSUAL RASH, SWELLING OR PAIN  °UNUSUAL VAGINAL DISCHARGE OR ITCHING  ° °Items with * indicate a potential emergency and should be followed up as soon as possible or go to the Emergency Department if any problems should occur. ° °Please show the CHEMOTHERAPY ALERT CARD or IMMUNOTHERAPY ALERT CARD at check-in  to the Emergency Department and triage nurse. ° °Should you have questions after your visit or need to cancel or reschedule your appointment, please contact [facility]  Dept: [phone]  and follow the prompts.  Office hours are 8:00 a.m. to 4:30 p.m. Monday - Friday. Please note that voicemails left after 4:00 p.m. may not be returned until the following business day.  We are closed weekends and major holidays. You have access to a nurse at all times for urgent questions. Please call the main number to the clinic Dept: [phone] and follow the prompts. ° ° °For any non-urgent questions, you may also contact your provider using MyChart. We now offer e-Visits for anyone 18 and older to request care online for non-urgent symptoms. For details visit [email or web address]. °  °Also download the MyChart app! Go to the app store, search "MyChart", open the app, select [place], and log in with your MyChart username and password. ° °Due to Covid, a mask is required upon entering the hospital/clinic. If you do not have a mask, one will be given to you upon arrival. For doctor visits, patients may have 1 support person aged 18 or older with them. For treatment visits, patients cannot have anyone with them due to current Covid guidelines and our immunocompromised population.  ° °

## 2020-07-03 NOTE — Patient Instructions (Signed)

## 2020-07-24 ENCOUNTER — Inpatient Hospital Stay: Payer: PPO | Attending: Internal Medicine

## 2020-07-24 ENCOUNTER — Other Ambulatory Visit: Payer: PPO

## 2020-07-24 ENCOUNTER — Other Ambulatory Visit: Payer: Self-pay

## 2020-07-24 ENCOUNTER — Inpatient Hospital Stay: Payer: PPO

## 2020-07-24 ENCOUNTER — Inpatient Hospital Stay: Payer: PPO | Admitting: Internal Medicine

## 2020-07-24 VITALS — BP 141/83 | HR 79 | Temp 98.1°F | Resp 19 | Ht 67.0 in | Wt 206.5 lb

## 2020-07-24 DIAGNOSIS — Z7984 Long term (current) use of oral hypoglycemic drugs: Secondary | ICD-10-CM | POA: Insufficient documentation

## 2020-07-24 DIAGNOSIS — C457 Mesothelioma of other sites: Secondary | ICD-10-CM

## 2020-07-24 DIAGNOSIS — C450 Mesothelioma of pleura: Secondary | ICD-10-CM | POA: Diagnosis not present

## 2020-07-24 DIAGNOSIS — Z95828 Presence of other vascular implants and grafts: Secondary | ICD-10-CM

## 2020-07-24 DIAGNOSIS — Z79899 Other long term (current) drug therapy: Secondary | ICD-10-CM | POA: Diagnosis not present

## 2020-07-24 DIAGNOSIS — Z5111 Encounter for antineoplastic chemotherapy: Secondary | ICD-10-CM

## 2020-07-24 DIAGNOSIS — Z7982 Long term (current) use of aspirin: Secondary | ICD-10-CM | POA: Diagnosis not present

## 2020-07-24 DIAGNOSIS — I10 Essential (primary) hypertension: Secondary | ICD-10-CM

## 2020-07-24 LAB — CBC WITH DIFFERENTIAL (CANCER CENTER ONLY)
Abs Immature Granulocytes: 0.04 10*3/uL (ref 0.00–0.07)
Basophils Absolute: 0 10*3/uL (ref 0.0–0.1)
Basophils Relative: 0 %
Eosinophils Absolute: 0 10*3/uL (ref 0.0–0.5)
Eosinophils Relative: 0 %
HCT: 34.5 % — ABNORMAL LOW (ref 39.0–52.0)
Hemoglobin: 12.5 g/dL — ABNORMAL LOW (ref 13.0–17.0)
Immature Granulocytes: 1 %
Lymphocytes Relative: 10 %
Lymphs Abs: 0.9 10*3/uL (ref 0.7–4.0)
MCH: 31.4 pg (ref 26.0–34.0)
MCHC: 36.2 g/dL — ABNORMAL HIGH (ref 30.0–36.0)
MCV: 86.7 fL (ref 80.0–100.0)
Monocytes Absolute: 0.5 10*3/uL (ref 0.1–1.0)
Monocytes Relative: 6 %
Neutro Abs: 7 10*3/uL (ref 1.7–7.7)
Neutrophils Relative %: 83 %
Platelet Count: 156 10*3/uL (ref 150–400)
RBC: 3.98 MIL/uL — ABNORMAL LOW (ref 4.22–5.81)
RDW: 12 % (ref 11.5–15.5)
WBC Count: 8.4 10*3/uL (ref 4.0–10.5)
nRBC: 0 % (ref 0.0–0.2)

## 2020-07-24 LAB — CMP (CANCER CENTER ONLY)
ALT: 17 U/L (ref 0–44)
AST: 12 U/L — ABNORMAL LOW (ref 15–41)
Albumin: 3.6 g/dL (ref 3.5–5.0)
Alkaline Phosphatase: 57 U/L (ref 38–126)
Anion gap: 11 (ref 5–15)
BUN: 29 mg/dL — ABNORMAL HIGH (ref 8–23)
CO2: 25 mmol/L (ref 22–32)
Calcium: 9.2 mg/dL (ref 8.9–10.3)
Chloride: 100 mmol/L (ref 98–111)
Creatinine: 1.39 mg/dL — ABNORMAL HIGH (ref 0.61–1.24)
GFR, Estimated: 52 mL/min — ABNORMAL LOW (ref >60)
Glucose, Bld: 353 mg/dL — ABNORMAL HIGH (ref 70–99)
Potassium: 4.1 mmol/L (ref 3.5–5.1)
Sodium: 136 mmol/L (ref 135–145)
Total Bilirubin: 0.5 mg/dL (ref 0.3–1.2)
Total Protein: 6.9 g/dL (ref 6.5–8.1)

## 2020-07-24 LAB — TOTAL PROTEIN, URINE DIPSTICK: Protein, ur: <30 mg/dL

## 2020-07-24 MED ORDER — SODIUM CHLORIDE 0.9 % IV SOLN
Freq: Once | INTRAVENOUS | Status: AC
  Filled 2020-07-24: qty 250

## 2020-07-24 MED ORDER — SODIUM CHLORIDE 0.9 % IV SOLN
15.0000 mg/kg | Freq: Once | INTRAVENOUS | Status: AC
  Administered 2020-07-24: 1300 mg via INTRAVENOUS
  Filled 2020-07-24: qty 48

## 2020-07-24 MED ORDER — SODIUM CHLORIDE 0.9% FLUSH
10.0000 mL | Freq: Once | INTRAVENOUS | Status: AC
  Administered 2020-07-24: 10 mL
  Filled 2020-07-24: qty 10

## 2020-07-24 MED ORDER — HEPARIN SOD (PORK) LOCK FLUSH 100 UNIT/ML IV SOLN
500.0000 [IU] | Freq: Once | INTRAVENOUS | Status: AC | PRN
  Administered 2020-07-24: 500 [IU]
  Filled 2020-07-24: qty 5

## 2020-07-24 MED ORDER — SODIUM CHLORIDE 0.9% FLUSH
10.0000 mL | INTRAVENOUS | Status: DC | PRN
  Administered 2020-07-24: 10 mL
  Filled 2020-07-24: qty 10

## 2020-07-24 NOTE — Progress Notes (Signed)
[place] [phone]   Fax:([phone]  OFFICE PROGRESS NOTE  [name], MD [place] [address]  DIAGNOSIS: Stage I/II malignant pleural mesothelioma involving the left hemithorax diagnosed in September 2021.  PRIOR THERAPY: None.  CURRENT THERAPY:  Systemic chemotherapy with carboplatin for AUC of 5, Alimta 500 mg/M2 and Avastin 15 mg/KG every 3 weeks.  Status post 11 cycles.  Starting from cycle #7 the patient will be on treatment with maintenance Avastin 15 mg/KG every 3 weeks.  INTERVAL HISTORY: Jack Haynes 80 y.o. male returns to the clinic today for follow-up visit accompanied by his girlfriend.  The patient is feeling fine today with no concerning complaints except for mild neuropathy in the right foot started few days ago with right-sided iliac back pain.  He denied having any current chest pain, shortness of breath, cough or hemoptysis.  He denied having any fever or chills.  He has no nausea, vomiting, diarrhea or constipation.  He has no headache or visual changes.  He continues to tolerate his treatment with Avastin fairly well.  MEDICAL HISTORY: Past Medical History:  Diagnosis Date   Arthritis    Cancer ([place])    hx of prostatae cancer   Dyspnea    until chest tube placed   Hypertension    Mesothelioma [facility])     ALLERGIES:  has no allergies on file.  MEDICATIONS:  Current Outpatient Medications  Medication Sig Dispense Refill   acetaminophen (TYLENOL) 500 MG tablet Take 2 tablets (1,000 mg total) by mouth every 6 (six) hours as needed for mild pain or fever. (Patient taking differently: Take 500 mg by mouth every 6 (six) hours as needed for mild pain or fever.) 30 tablet 0   aspirin EC 81 MG tablet Take 81 mg by mouth daily. Swallow whole.     calcium carbonate (TUMS - DOSED IN MG ELEMENTAL CALCIUM) 500 MG chewable tablet Chew 1,000 mg by mouth daily as needed for indigestion or heartburn.      cetirizine (ZYRTEC) 10 MG tablet Take 10 mg by mouth daily.     dexamethasone (DECADRON) 4 MG tablet 1 tablet p.o. twice daily the day before, day of and day after chemotherapy every 3 weeks (Patient taking differently: Take 4 mg by mouth See admin instructions. 4 mg twice daily the day before, day of and day after chemotherapy every 3 weeks) 40 tablet 1   fluconazole (DIFLUCAN) 100 MG tablet TAKE 1 TABLET BY MOUTH EVERY DAY (Patient taking differently: Take 100 mg by mouth daily as needed (thrush).) 7 tablet 0   folic acid (FOLVITE) 1 MG tablet TAKE 1 TABLET BY MOUTH EVERY DAY (Patient taking differently: Take 1 mg by mouth daily.) 90 tablet 1   lidocaine-prilocaine (EMLA) cream Apply to Port-A-Cath site 30-60-minute before treatment. (Patient taking differently: Apply 1 application topically daily as needed (Apply to Port-A-Cath site 30-60-minute before treatment.).) 30 g 0   losartan-hydrochlorothiazide (HYZAAR) 100-12.5 MG tablet Take 1 tablet by mouth daily. 90 tablet 3   metFORMIN (GLUCOPHAGE) 500 MG tablet Take 1 tablet (500 mg total) by mouth 2 (two) times daily with a meal. 180 tablet 3   ondansetron (ZOFRAN ODT) 4 MG disintegrating tablet Take 1 tablet (4 mg total) by mouth every 8 (eight) hours as needed for nausea or vomiting. Starting 3 days after chemotherapy 30 tablet 1   prochlorperazine (COMPAZINE) 10 MG tablet TAKE 1 TABLET BY MOUTH EVERY 6 HOURS  AS NEEDED FOR NAUSEA OR VOMITING. (Patient taking differently: Take 10 mg by mouth every 6 (six) hours as needed for vomiting or nausea.) 30 tablet 0   rosuvastatin (CRESTOR) 20 MG tablet Take 1 tablet (20 mg total) by mouth daily. 90 tablet 1   simethicone (MYLICON) 371 MG chewable tablet Chew 125 mg by mouth daily as needed (gas).     No current facility-administered medications for this visit.    SURGICAL HISTORY:  Past Surgical History:  Procedure Laterality Date   APPENDECTOMY     BACK SURGERY  1998   disectomy   CHEST TUBE  INSERTION Left 11/13/2019   Procedure: INSERTION PLEURAL DRAINAGE CATHETER;  Surgeon: Grace Isaac, MD;  Location: [place];  Service: Thoracic;  Laterality: Left;   EYE SURGERY Left 1965   injury   IR [place]  05/27/2020   JOINT REPLACEMENT Left 2010   left knee replacement   KNEE SURGERY     PLEURAL BIOPSY Left 11/13/2019   Procedure: PLEURAL BIOPSY;  Surgeon: Grace Isaac, MD;  Location: [place];  Service: Thoracic;  Laterality: Left;   PORTACATH PLACEMENT Left 11/26/2019   Procedure: INSERTION PORT-A-CATH BARD POWERPORT 9.6 FR CATHETER;  Surgeon: Grace Isaac, MD;  Location: [place];  Service: Thoracic;  Laterality: Left;   PROSTATE SURGERY     REMOVAL OF PLEURAL DRAINAGE CATHETER Left 11/26/2019   Procedure: REMOVAL OF PLEURAL DRAINAGE CATHETER;  Surgeon: Grace Isaac, MD;  Location: [place];  Service: Thoracic;  Laterality: Left;   SPINE SURGERY     TALC PLEURODESIS Left 11/13/2019   Procedure: possible TALC PLEURADESIS;  Surgeon: Grace Isaac, MD;  Location: [place];  Service: Thoracic;  Laterality: Left;   TOTAL KNEE ARTHROPLASTY  02/01/2012   Procedure: TOTAL KNEE ARTHROPLASTY;  Surgeon: Sydnee Cabal, MD;  [facility];  Service: Orthopedics;  Laterality: Left;   VIDEO ASSISTED THORACOSCOPY Left 11/13/2019   Procedure: VIDEO ASSISTED THORACOSCOPY;  Surgeon: Grace Isaac, MD;  Location: [place];  Service: Thoracic;  Laterality: Left;   VIDEO BRONCHOSCOPY N/A 11/13/2019   Procedure: VIDEO BRONCHOSCOPY;  Surgeon: Grace Isaac, MD;  Location[facility];  Service: Thoracic;  Laterality: N/A;    REVIEW OF SYSTEMS:  A comprehensive review of systems was negative except for: Constitutional: positive for fatigue Neurological: positive for paresthesia   PHYSICAL EXAMINATION: General appearance: alert, cooperative, fatigued, and no distress Head: Normocephalic, without obvious abnormality, atraumatic Neck: no adenopathy, no JVD,  supple, symmetrical, trachea midline, and thyroid not enlarged, symmetric, no tenderness/mass/nodules Lymph nodes: Cervical, supraclavicular, and axillary nodes normal. Resp: clear to auscultation bilaterally Back: symmetric, no curvature. ROM normal. No CVA tenderness. Cardio: regular rate and rhythm, S1, S2 normal, no murmur, click, rub or gallop GI: soft, non-tender; bowel sounds normal; no masses,  no organomegaly Extremities: extremities normal, atraumatic, no cyanosis or edema  ECOG PERFORMANCE STATUS: 0 - Asymptomatic  Blood pressure (!) 141/83, pulse 79, temperature 98.1 F (36.7 C), temperature source Oral, resp. rate 19, height 5\' 7"  (1.702 m), weight 206 lb 8 oz (93.7 kg), SpO2 98 %.  LABORATORY DATA: Lab Results  Component Value Date   WBC 8.4 07/24/2020   HGB 12.5 (L) 07/24/2020   HCT 34.5 (L) 07/24/2020   MCV 86.7 07/24/2020   PLT 156 07/24/2020      Chemistry      Component Value Date/Time   NA 136 07/24/2020 0830   NA 141 09/24/2019  0832   K 4.1 07/24/2020 0830   CL 100 07/24/2020 0830   CO2 25 07/24/2020 0830   BUN 29 (H) 07/24/2020 0830   BUN 17 09/24/2019 0832   CREATININE 1.39 (H) 07/24/2020 0830   CREATININE 1.01 12/24/2014 0916      Component Value Date/Time   CALCIUM 9.2 07/24/2020 0830   ALKPHOS 57 07/24/2020 0830   AST 12 (L) 07/24/2020 0830   ALT 17 07/24/2020 0830   BILITOT 0.5 07/24/2020 0830       RADIOGRAPHIC STUDIES: No results found.   ASSESSMENT AND PLAN: This is a very pleasant 80 years old white male recently diagnosed with malignant pleural mesothelioma involving the left hemithorax. The patient is currently undergoing systemic chemotherapy with carboplatin for AUC of 5, Alimta 500 mg/M2 and Avastin 15 mg/KG every 3 weeks.  Status post 11 cycles.  Starting from cycle #7 the patient will be on maintenance treatment with Avastin 15 mg/KG every 3 weeks. The patient has been tolerating this treatment well with no concerning adverse  effects. I recommended for him to proceed with cycle #12 today as planned. I will see him back for follow-up visit in 3 weeks for evaluation with repeat CT scan of the chest for restaging of his disease. The patient was advised to call immediately if he has any other concerning symptoms in the interval. The patient voices understanding of current disease status and treatment options and is in agreement with the current care plan.  All questions were answered. The patient knows to call the clinic with any problems, questions or concerns. We can certainly see the patient much sooner if necessary.   Disclaimer: This note was dictated with voice recognition software. Similar sounding words can inadvertently be transcribed and may not be corrected upon review.

## 2020-07-24 NOTE — Patient Instructions (Signed)
[facility]  Discharge Instructions: °Thank you for choosing [facility] to provide your oncology and hematology care.  ° °If you have a lab appointment with the Cancer Center, please go directly to the Cancer Center and check in at the registration area. °  °Wear comfortable clothing and clothing appropriate for easy access to any Portacath or PICC line.  ° °We strive to give you quality time with your provider. You may need to reschedule your appointment if you arrive late (15 or more minutes).  Arriving late affects you and other patients whose appointments are after yours.  Also, if you miss three or more appointments without notifying the office, you may be dismissed from the clinic at the provider’s discretion.    °  °For prescription refill requests, have your pharmacy contact our office and allow 72 hours for refills to be completed.   ° °Today you received the following chemotherapy and/or immunotherapy agents: Bevacizumab.     °  °To help prevent nausea and vomiting after your treatment, we encourage you to take your nausea medication as directed. ° °BELOW ARE SYMPTOMS THAT SHOULD BE REPORTED IMMEDIATELY: °*FEVER GREATER THAN 100.4 F (38 °C) OR HIGHER °*CHILLS OR SWEATING °*NAUSEA AND VOMITING THAT IS NOT CONTROLLED WITH YOUR NAUSEA MEDICATION °*UNUSUAL SHORTNESS OF BREATH °*UNUSUAL BRUISING OR BLEEDING °*URINARY PROBLEMS (pain or burning when urinating, or frequent urination) °*BOWEL PROBLEMS (unusual diarrhea, constipation, pain near the anus) °TENDERNESS IN MOUTH AND THROAT WITH OR WITHOUT PRESENCE OF ULCERS (sore throat, sores in mouth, or a toothache) °UNUSUAL RASH, SWELLING OR PAIN  °UNUSUAL VAGINAL DISCHARGE OR ITCHING  ° °Items with * indicate a potential emergency and should be followed up as soon as possible or go to the Emergency Department if any problems should occur. ° °Please show the CHEMOTHERAPY ALERT CARD or IMMUNOTHERAPY ALERT CARD at check-in  to the Emergency Department and triage nurse. ° °Should you have questions after your visit or need to cancel or reschedule your appointment, please contact [facility]  Dept: [phone]  and follow the prompts.  Office hours are 8:00 a.m. to 4:30 p.m. Monday - Friday. Please note that voicemails left after 4:00 p.m. may not be returned until the following business day.  We are closed weekends and major holidays. You have access to a nurse at all times for urgent questions. Please call the main number to the clinic Dept: [phone] and follow the prompts. ° ° °For any non-urgent questions, you may also contact your provider using MyChart. We now offer e-Visits for anyone 18 and older to request care online for non-urgent symptoms. For details visit [email or web address]. °  °Also download the MyChart app! Go to the app store, search "MyChart", open the app, select [facility], and log in with your MyChart username and password. ° °Due to Covid, a mask is required upon entering the hospital/clinic. If you do not have a mask, one will be given to you upon arrival. For doctor visits, patients may have 1 support person aged 18 or older with them. For treatment visits, patients cannot have anyone with them due to current Covid guidelines and our immunocompromised population.  ° °

## 2020-08-01 ENCOUNTER — Other Ambulatory Visit: Payer: Self-pay | Admitting: Cardiovascular Disease

## 2020-08-01 DIAGNOSIS — I2510 Atherosclerotic heart disease of native coronary artery without angina pectoris: Secondary | ICD-10-CM

## 2020-08-03 ENCOUNTER — Other Ambulatory Visit: Payer: Self-pay | Admitting: Emergency Medicine

## 2020-08-11 NOTE — Progress Notes (Signed)
[facility] OFFICE PROGRESS NOTE  [name], [name] [id number]  DIAGNOSIS: Stage I/II malignant pleural mesothelioma involving the left hemithorax diagnosed in September 2021.  PRIOR THERAPY: None  CURRENT THERAPY: Systemic chemotherapy with carboplatin for AUC of 5, Alimta 500 mg/M2 and Avastin 15 mg/KG every 3 weeks.  Status post 12 cycles.  Starting from cycle #7 the patient will be on treatment with maintenance Avastin 15 mg/KG every 3 weeks.  INTERVAL HISTORY: Jack Haynes 80 y.o. male returns to the clinic for a follow up visit accompanied by his wife. The patient is feeling well today without any concerning complaints except for once every 10 days or so he has bilateral numbness/tingling/stinging for about 2 days. They apply cream on his feet and it resolves his symptoms. The patient continues to tolerate treatment with single agent Avastin well without any adverse effects. Denies any fever, chills, night sweats, or weight loss. He has a strong appetite. Denies any chest pain, cough, or hemoptysis. His dyspnea on exertion is stable the last 3-4 weeks. Denies any nausea, vomiting, diarrhea, or constipation. Denies any headache or visual changes. Denies any rashes or skin changes except easy bruising on his extremities if he bumps himself. Of note, he takes a baby aspirin.  The patient denies any abnormal bleeding or bruising otherwise. He reports runny nose in watery eyes in the morning for about 3 hours that resolves with zyrtec. The patient recently had a restaging CT scan of the chest, abdomen, and pelvis performed. The patient is here today for evaluation and to review his scan results prior to starting cycle # 13    MEDICAL HISTORY: Past Medical History:  Diagnosis Date   Arthritis    Cancer ([place])    hx of prostatae cancer   Dyspnea    until chest tube placed   Hypertension    Mesothelioma [facility])     ALLERGIES:  has no allergies  on file.  MEDICATIONS:  Current Outpatient Medications  Medication Sig Dispense Refill   acetaminophen (TYLENOL) 500 MG tablet Take 2 tablets (1,000 mg total) by mouth every 6 (six) hours as needed for mild pain or fever. (Patient taking differently: Take 500 mg by mouth every 6 (six) hours as needed for mild pain or fever.) 30 tablet 0   aspirin EC 81 MG tablet Take 81 mg by mouth daily. Swallow whole.     calcium carbonate (TUMS - DOSED IN MG ELEMENTAL CALCIUM) 500 MG chewable tablet Chew 1,000 mg by mouth daily as needed for indigestion or heartburn.     cetirizine (ZYRTEC) 10 MG tablet Take 10 mg by mouth daily.     dexamethasone (DECADRON) 4 MG tablet 1 tablet p.o. twice daily the day before, day of and day after chemotherapy every 3 weeks (Patient taking differently: Take 4 mg by mouth See admin instructions. 4 mg twice daily the day before, day of and day after chemotherapy every 3 weeks) 40 tablet 1   fluconazole (DIFLUCAN) 100 MG tablet TAKE 1 TABLET BY MOUTH EVERY DAY (Patient taking differently: Take 100 mg by mouth daily as needed (thrush).) 7 tablet 0   folic acid (FOLVITE) 1 MG tablet TAKE 1 TABLET BY MOUTH EVERY DAY 90 tablet 1   lidocaine-prilocaine (EMLA) cream Apply to Port-A-Cath site 30-60-minute before treatment. (Patient taking differently: Apply 1 application topically daily as needed (Apply to Port-A-Cath site 30-60-minute before treatment.).) 30 g 0   losartan-hydrochlorothiazide (HYZAAR) 100-12.5 MG  tablet Take 1 tablet by mouth daily. 90 tablet 3   metFORMIN (GLUCOPHAGE) 500 MG tablet Take 1 tablet (500 mg total) by mouth 2 (two) times daily with a meal. 180 tablet 3   ondansetron (ZOFRAN ODT) 4 MG disintegrating tablet Take 1 tablet (4 mg total) by mouth every 8 (eight) hours as needed for nausea or vomiting. Starting 3 days after chemotherapy 30 tablet 1   prochlorperazine (COMPAZINE) 10 MG tablet TAKE 1 TABLET BY MOUTH EVERY 6 HOURS AS NEEDED FOR NAUSEA OR VOMITING.  (Patient taking differently: Take 10 mg by mouth every 6 (six) hours as needed for vomiting or nausea.) 30 tablet 0   rosuvastatin (CRESTOR) 20 MG tablet TAKE 1 TABLET BY MOUTH EVERY DAY 90 tablet 3   simethicone (MYLICON) 130 MG chewable tablet Chew 125 mg by mouth daily as needed (gas).     No current facility-administered medications for this visit.   Facility-Administered Medications Ordered in Other Visits  Medication Dose Route Frequency Provider Last Rate Last Admin   bevacizumab-bvzr (ZIRABEV) 1,300 mg in sodium chloride 0.9 % 100 mL chemo infusion  15 mg/kg (Treatment Plan Recorded) Intravenous Once Curt Bears, MD       heparin lock flush 100 unit/mL  500 Units Intracatheter Once PRN Curt Bears, MD       sodium chloride flush (NS) 0.9 % injection 10 mL  10 mL Intracatheter PRN Curt Bears, MD        SURGICAL HISTORY:  Past Surgical History:  Procedure Laterality Date   APPENDECTOMY     BACK SURGERY  1998   disectomy   CHEST TUBE INSERTION Left 11/13/2019   Procedure: INSERTION PLEURAL DRAINAGE CATHETER;  Surgeon: Grace Isaac, MD;  Location: [facility];  Service: Thoracic;  Laterality: Left;   EYE SURGERY Left 1965   injury   IR [place]  05/27/2020   JOINT REPLACEMENT Left 2010   left knee replacement   KNEE SURGERY     PLEURAL BIOPSY Left 11/13/2019   Procedure: PLEURAL BIOPSY;  Surgeon: Grace Isaac, MD;  Location: [facility];  Service: Thoracic;  Laterality: Left;   PORTACATH PLACEMENT Left 11/26/2019   Procedure: INSERTION PORT-A-CATH BARD POWERPORT 9.6 FR CATHETER;  Surgeon: Grace Isaac, MD;  Location: [facility];  Service: Thoracic;  Laterality: Left;   PROSTATE SURGERY     REMOVAL OF PLEURAL DRAINAGE CATHETER Left 11/26/2019   Procedure: REMOVAL OF PLEURAL DRAINAGE CATHETER;  Surgeon: Grace Isaac, MD;  Location: [facility];  Service: Thoracic;  Laterality: Left;   SPINE SURGERY     TALC PLEURODESIS Left 11/13/2019    Procedure: possible TALC PLEURADESIS;  Surgeon: Grace Isaac, MD;  Location: [facility];  Service: Thoracic;  Laterality: Left;   TOTAL KNEE ARTHROPLASTY  02/01/2012   Procedure: TOTAL KNEE ARTHROPLASTY;  Surgeon: Sydnee Cabal, MD;  Location: [facility];  Service: Orthopedics;  Laterality: Left;   VIDEO ASSISTED THORACOSCOPY Left 11/13/2019   Procedure: VIDEO ASSISTED THORACOSCOPY;  Surgeon: Grace Isaac, MD;  Location: [facility];  Service: Thoracic;  Laterality: Left;   VIDEO BRONCHOSCOPY N/A 11/13/2019   Procedure: VIDEO BRONCHOSCOPY;  Surgeon: Grace Isaac, MD;  [facility];  Service: Thoracic;  Laterality: N/A;    REVIEW OF SYSTEMS:   Review of Systems  Constitutional: Negative for appetite change, chills, fatigue, fever and unexpected weight change.  HENT: Negative for mouth sores, nosebleeds, sore throat and trouble swallowing.   Eyes: Negative  for eye problems and icterus.  Respiratory: Positive for baseline dyspnea on exertion. Negative for cough, hemoptysis, and wheezing.   Cardiovascular: Negative for chest pain and leg swelling.  Gastrointestinal: Negative for abdominal pain, constipation, diarrhea, nausea and vomiting.  Genitourinary: Negative for bladder incontinence, difficulty urinating, dysuria, frequency and hematuria.   Musculoskeletal: Negative for back pain, gait problem, neck pain and neck stiffness.  Skin: Negative for itching and rash.  Neurological: Positive for intermittent numbness/tingling/stinging in toes bilaterally. Negative for dizziness, extremity weakness, gait problem, headaches, light-headedness and seizures.  Hematological: Negative for adenopathy. Does not bruise/bleed easily.  Psychiatric/Behavioral: Negative for confusion, depression and sleep disturbance. The patient is not nervous/anxious.     PHYSICAL EXAMINATION:  Blood pressure (!) 146/86, pulse 99, temperature 97.9 F (36.6 C), temperature source Oral, resp. rate 20, height 5\' 7"   (1.702 m), weight 204 lb 12.8 oz (92.9 kg), SpO2 97 %.  ECOG PERFORMANCE STATUS: 1  Physical Exam  Constitutional: Oriented to person, place, and time and well-developed, well-nourished, and in no distress.  HENT:  Head: Normocephalic and atraumatic.  Mouth/Throat: Oropharynx is clear and moist. No oropharyngeal exudate.  Eyes: Conjunctivae are normal. Right eye exhibits no discharge. Left eye exhibits no discharge. No scleral icterus.  Neck: Normal range of motion. Neck supple.  Cardiovascular: Normal rate, regular rhythm, normal heart sounds and intact distal pulses.   Pulmonary/Chest: Effort normal and breath sounds normal. No respiratory distress. No wheezes. No rales.  Abdominal: Soft. Bowel sounds are normal. Exhibits no distension and no mass. There is no tenderness.  Musculoskeletal: Normal range of motion. Exhibits no edema.  Lymphadenopathy:    No cervical adenopathy.  Neurological: Alert and oriented to person, place, and time. Exhibits normal muscle tone. Gait normal. Coordination normal.  Skin: Skin is warm and dry. No rash noted. Not diaphoretic. No erythema. No pallor.  Psychiatric: Mood, memory and judgment normal.  Vitals reviewed.  LABORATORY DATA: Lab Results  Component Value Date   WBC 10.4 08/14/2020   HGB 13.1 08/14/2020   HCT 37.3 (L) 08/14/2020   MCV 88.2 08/14/2020   PLT 158 08/14/2020      Chemistry      Component Value Date/Time   NA 136 08/14/2020 0909   NA 141 09/24/2019 0832   K 4.3 08/14/2020 0909   CL 102 08/14/2020 0909   CO2 23 08/14/2020 0909   BUN 27 (H) 08/14/2020 0909   BUN 17 09/24/2019 0832   CREATININE 1.36 (H) 08/14/2020 0909   CREATININE 1.01 12/24/2014 0916      Component Value Date/Time   CALCIUM 9.7 08/14/2020 0909   ALKPHOS 57 08/14/2020 0909   AST 12 (L) 08/14/2020 0909   ALT 12 08/14/2020 0909   BILITOT 0.5 08/14/2020 0909       RADIOGRAPHIC STUDIES:  CT Chest W Contrast  Result Date: 08/13/2020 CLINICAL DATA:   Malignant mesothelioma of left hemithorax. Ongoing chemotherapy. EXAM: CT CHEST WITH CONTRAST TECHNIQUE: Multidetector CT imaging of the chest was performed during intravenous contrast administration. CONTRAST:  32mL OMNIPAQUE IOHEXOL 300 MG/ML  SOLN COMPARISON:  06/09/2020 FINDINGS: Cardiovascular: No acute findings. Aortic and coronary atherosclerotic calcification noted. Mediastinum/Nodes: No masses or pathologically enlarged lymph nodes identified. Lungs/Pleura: Diffuse nodular soft tissue thickening is seen throughout the left pleural space, and is stable since previous study. Largest index area of soft tissue nodularity in the left posterior pleura measures 2.1 x 1.7 cm on image 103/2, without change compared to previous study. No evidence of left  pleural effusion. Left lung scarring remains stable, and no suspicious pulmonary nodules or masses are identified. Right lung remains clear. Upper Abdomen:  Unremarkable. Musculoskeletal:  No suspicious bone lesions. IMPRESSION: Stable diffuse nodular soft tissue thickening throughout the left pleural space, consistent with known malignant mesothelioma. No new or progressive disease within the thorax. Aortic Atherosclerosis (ICD10-I70.0). Electronically Signed   By: Marlaine Hind M.D.   On: 08/13/2020 10:44     ASSESSMENT/PLAN:  This is a very pleasant 80 year old Caucasian male diagnosed with malignant mesothelioma involving the left hemithorax.  The patient was diagnosed in September 2021.   The patient is currently undergoing systemic chemotherapy with carboplatin for AUC of 5, Alimta 500 mg/M2 and Avastin 15 mg/KG every 3 weeks.  Status post 12 cycles.  Starting from cycle #7 the patient will be on maintenance treatment with Avastin 15 mg/KG every 3 weeks. The patient has been tolerating this treatment well with no concerning adverse effects.  The patient recently had a restaging CT scan performed. Dr. Julien Nordmann personally and independently reviewed the  scan and discussed the results with the patient today.   The scan showed no evidence for disease progression. Recommend that he proceed with cycle #13 today as scheduled.   We will see him back for a follow up visit in 3 weeks for evaluation before starting cycle #14.   Dr. Julien Nordmann does not feel that his tingling in his toes is related to his treatment. His symptoms are very mild and only occur for 2 days every 10-14 days or so. It resolves with cream. Continue to monitor for now.   The patient was advised to call immediately if he has any concerning symptoms in the interval. The patient voices understanding of current disease status and treatment options and is in agreement with the current care plan. All questions were answered. The patient knows to call the clinic with any problems, questions or concerns. We can certainly see the patient much sooner if necessary    No orders of the defined types were placed in this encounter.     Jack Haynes L Josearmando Kuhnert, PA-C 08/14/20  ADDENDUM: Hematology/Oncology Attending: I had a face-to-face encounter with the patient today.  I reviewed his record, lab and scan and recommended his care plan.  This is a very pleasant 80 years old white male diagnosed with malignant pleural mesothelioma involving the left hemothorax in September 2021.  The patient started systemic chemotherapy with carboplatin for AUC 5, Alimta 500 Mg/M2 and Avastin 15 mg/KG every 3 weeks status post total of 12 cycles but starting from cycle #7 he is on maintenance treatment with Avastin every 3 weeks. The patient has been tolerating this treatment well with no concerning adverse effects. He had repeat CT scan of the chest performed recently.  I personally and independently reviewed the scans and discussed the results with the patient and his girlfriend. Has a scan showed no concerning findings for disease progression. I recommended for him to continue his current treatment with  Avastin and he will proceed with cycle #13 today. The patient will come back for follow-up visit in 3 weeks for evaluation before the next cycle of his treatment. He was advised to call immediately if he has any other concerning symptoms in the interval. The total time spent in the appointment was 30 minutes. Disclaimer: This note was dictated with voice recognition software. Similar sounding words can inadvertently be transcribed and may be missed upon review. Eilleen Kempf, MD 08/14/20

## 2020-08-12 ENCOUNTER — Other Ambulatory Visit: Payer: Self-pay

## 2020-08-12 ENCOUNTER — Ambulatory Visit (HOSPITAL_COMMUNITY)
Admission: RE | Admit: 2020-08-12 | Discharge: 2020-08-12 | Disposition: A | Discharge status: Home / Self Care | Payer: PPO | Source: Ambulatory Visit | Attending: Internal Medicine | Admitting: Internal Medicine

## 2020-08-12 DIAGNOSIS — C457 Mesothelioma of other sites: Secondary | ICD-10-CM | POA: Diagnosis not present

## 2020-08-12 DIAGNOSIS — I700 Atherosclerosis of aorta: Secondary | ICD-10-CM | POA: Diagnosis not present

## 2020-08-12 MED ORDER — IOHEXOL 300 MG/ML  SOLN
75.0000 mL | Freq: Once | INTRAMUSCULAR | Status: AC | PRN
  Administered 2020-08-12: 75 mL via INTRAVENOUS

## 2020-08-13 ENCOUNTER — Other Ambulatory Visit: Payer: Self-pay | Admitting: Internal Medicine

## 2020-08-14 ENCOUNTER — Encounter: Payer: Self-pay | Admitting: Internal Medicine

## 2020-08-14 ENCOUNTER — Inpatient Hospital Stay: Payer: PPO

## 2020-08-14 ENCOUNTER — Other Ambulatory Visit: Payer: Self-pay

## 2020-08-14 ENCOUNTER — Inpatient Hospital Stay: Payer: PPO | Admitting: Physician Assistant

## 2020-08-14 VITALS — BP 136/80 | HR 81 | Resp 18

## 2020-08-14 VITALS — BP 146/86 | HR 99 | Temp 97.9°F | Resp 20 | Ht 67.0 in | Wt 204.8 lb

## 2020-08-14 DIAGNOSIS — Z5111 Encounter for antineoplastic chemotherapy: Secondary | ICD-10-CM

## 2020-08-14 DIAGNOSIS — C457 Mesothelioma of other sites: Secondary | ICD-10-CM

## 2020-08-14 LAB — CBC WITH DIFFERENTIAL (CANCER CENTER ONLY)
Abs Immature Granulocytes: 0.01 10*3/uL (ref 0.00–0.07)
Basophils Absolute: 0 10*3/uL (ref 0.0–0.1)
Basophils Relative: 0 %
Eosinophils Absolute: 0 10*3/uL (ref 0.0–0.5)
Eosinophils Relative: 0 %
HCT: 37.3 % — ABNORMAL LOW (ref 39.0–52.0)
Hemoglobin: 13.1 g/dL (ref 13.0–17.0)
Immature Granulocytes: 0 %
Lymphocytes Relative: 8 %
Lymphs Abs: 0.9 10*3/uL (ref 0.7–4.0)
MCH: 31 pg (ref 26.0–34.0)
MCHC: 35.1 g/dL (ref 30.0–36.0)
MCV: 88.2 fL (ref 80.0–100.0)
Monocytes Absolute: 0.8 10*3/uL (ref 0.1–1.0)
Monocytes Relative: 8 %
Neutro Abs: 8.7 10*3/uL — ABNORMAL HIGH (ref 1.7–7.7)
Neutrophils Relative %: 84 %
Platelet Count: 158 10*3/uL (ref 150–400)
RBC: 4.23 MIL/uL (ref 4.22–5.81)
RDW: 12.9 % (ref 11.5–15.5)
WBC Count: 10.4 10*3/uL (ref 4.0–10.5)
nRBC: 0 % (ref 0.0–0.2)

## 2020-08-14 LAB — CMP (CANCER CENTER ONLY)
ALT: 12 U/L (ref 0–44)
AST: 12 U/L — ABNORMAL LOW (ref 15–41)
Albumin: 3.6 g/dL (ref 3.5–5.0)
Alkaline Phosphatase: 57 U/L (ref 38–126)
Anion gap: 11 (ref 5–15)
BUN: 27 mg/dL — ABNORMAL HIGH (ref 8–23)
CO2: 23 mmol/L (ref 22–32)
Calcium: 9.7 mg/dL (ref 8.9–10.3)
Chloride: 102 mmol/L (ref 98–111)
Creatinine: 1.36 mg/dL — ABNORMAL HIGH (ref 0.61–1.24)
GFR, Estimated: 53 mL/min — ABNORMAL LOW (ref >60)
Glucose, Bld: 376 mg/dL — ABNORMAL HIGH (ref 70–99)
Potassium: 4.3 mmol/L (ref 3.5–5.1)
Sodium: 136 mmol/L (ref 135–145)
Total Bilirubin: 0.5 mg/dL (ref 0.3–1.2)
Total Protein: 6.9 g/dL (ref 6.5–8.1)

## 2020-08-14 LAB — TOTAL PROTEIN, URINE DIPSTICK: Protein, ur: 30 mg/dL — AB

## 2020-08-14 MED ORDER — SODIUM CHLORIDE 0.9 % IV SOLN
15.0000 mg/kg | Freq: Once | INTRAVENOUS | Status: AC
  Administered 2020-08-14: 1300 mg via INTRAVENOUS
  Filled 2020-08-14: qty 48

## 2020-08-14 MED ORDER — SODIUM CHLORIDE 0.9 % IV SOLN
Freq: Once | INTRAVENOUS | Status: AC
  Filled 2020-08-14: qty 250

## 2020-08-14 MED ORDER — HEPARIN SOD (PORK) LOCK FLUSH 100 UNIT/ML IV SOLN
500.0000 [IU] | Freq: Once | INTRAVENOUS | Status: AC | PRN
Start: 2020-08-14 — End: 2020-08-14
  Administered 2020-08-14: 500 [IU]
  Filled 2020-08-14: qty 5

## 2020-08-14 MED ORDER — SODIUM CHLORIDE 0.9% FLUSH
10.0000 mL | INTRAVENOUS | Status: DC | PRN
  Administered 2020-08-14: 10 mL
  Filled 2020-08-14: qty 10

## 2020-08-14 NOTE — Patient Instructions (Signed)
[facility]  Discharge Instructions: °Thank you for choosing [facility] to provide your oncology and hematology care.  ° °If you have a lab appointment with the Cancer Center, please go directly to the Cancer Center and check in at the registration area. °  °Wear comfortable clothing and clothing appropriate for easy access to any Portacath or PICC line.  ° °We strive to give you quality time with your provider. You may need to reschedule your appointment if you arrive late (15 or more minutes).  Arriving late affects you and other patients whose appointments are after yours.  Also, if you miss three or more appointments without notifying the office, you may be dismissed from the clinic at the provider’s discretion.    °  °For prescription refill requests, have your pharmacy contact our office and allow 72 hours for refills to be completed.   ° °Today you received the following chemotherapy and/or immunotherapy agents: Bevacizumab.     °  °To help prevent nausea and vomiting after your treatment, we encourage you to take your nausea medication as directed. ° °BELOW ARE SYMPTOMS THAT SHOULD BE REPORTED IMMEDIATELY: °*FEVER GREATER THAN 100.4 F (38 °C) OR HIGHER °*CHILLS OR SWEATING °*NAUSEA AND VOMITING THAT IS NOT CONTROLLED WITH YOUR NAUSEA MEDICATION °*UNUSUAL SHORTNESS OF BREATH °*UNUSUAL BRUISING OR BLEEDING °*URINARY PROBLEMS (pain or burning when urinating, or frequent urination) °*BOWEL PROBLEMS (unusual diarrhea, constipation, pain near the anus) °TENDERNESS IN MOUTH AND THROAT WITH OR WITHOUT PRESENCE OF ULCERS (sore throat, sores in mouth, or a toothache) °UNUSUAL RASH, SWELLING OR PAIN  °UNUSUAL VAGINAL DISCHARGE OR ITCHING  ° °Items with * indicate a potential emergency and should be followed up as soon as possible or go to the Emergency Department if any problems should occur. ° °Please show the CHEMOTHERAPY ALERT CARD or IMMUNOTHERAPY ALERT CARD at check-in  to the Emergency Department and triage nurse. ° °Should you have questions after your visit or need to cancel or reschedule your appointment, please contact [facility]  Dept: [phone]  and follow the prompts.  Office hours are 8:00 a.m. to 4:30 p.m. Monday - Friday. Please note that voicemails left after 4:00 p.m. may not be returned until the following business day.  We are closed weekends and major holidays. You have access to a nurse at all times for urgent questions. Please call the main number to the clinic Dept: [phone] and follow the prompts. ° ° °For any non-urgent questions, you may also contact your provider using MyChart. We now offer e-Visits for anyone 18 and older to request care online for non-urgent symptoms. For details visit [email or web address]. °  °Also download the MyChart app! Go to the app store, search "MyChart", open the app, select [facility], and log in with your MyChart username and password. ° °Due to Covid, a mask is required upon entering the hospital/clinic. If you do not have a mask, one will be given to you upon arrival. For doctor visits, patients may have 1 support person aged 18 or older with them. For treatment visits, patients cannot have anyone with them due to current Covid guidelines and our immunocompromised population.  ° °

## 2020-08-19 ENCOUNTER — Other Ambulatory Visit: Payer: Self-pay | Admitting: Internal Medicine

## 2020-09-04 ENCOUNTER — Inpatient Hospital Stay: Payer: PPO

## 2020-09-04 ENCOUNTER — Other Ambulatory Visit: Payer: Self-pay | Admitting: Internal Medicine

## 2020-09-04 ENCOUNTER — Inpatient Hospital Stay: Payer: PPO | Attending: Internal Medicine

## 2020-09-04 ENCOUNTER — Inpatient Hospital Stay: Payer: PPO | Admitting: Internal Medicine

## 2020-09-04 ENCOUNTER — Other Ambulatory Visit: Payer: Self-pay

## 2020-09-04 VITALS — BP 135/75 | HR 80

## 2020-09-04 VITALS — BP 149/92 | HR 90 | Temp 97.6°F | Resp 20 | Ht 67.0 in | Wt 205.1 lb

## 2020-09-04 DIAGNOSIS — Z79899 Other long term (current) drug therapy: Secondary | ICD-10-CM | POA: Insufficient documentation

## 2020-09-04 DIAGNOSIS — Z7982 Long term (current) use of aspirin: Secondary | ICD-10-CM | POA: Diagnosis not present

## 2020-09-04 DIAGNOSIS — Z7952 Long term (current) use of systemic steroids: Secondary | ICD-10-CM | POA: Diagnosis not present

## 2020-09-04 DIAGNOSIS — C457 Mesothelioma of other sites: Secondary | ICD-10-CM

## 2020-09-04 DIAGNOSIS — Z95828 Presence of other vascular implants and grafts: Secondary | ICD-10-CM

## 2020-09-04 DIAGNOSIS — R0602 Shortness of breath: Secondary | ICD-10-CM | POA: Diagnosis not present

## 2020-09-04 DIAGNOSIS — Z5111 Encounter for antineoplastic chemotherapy: Secondary | ICD-10-CM | POA: Diagnosis not present

## 2020-09-04 DIAGNOSIS — M129 Arthropathy, unspecified: Secondary | ICD-10-CM | POA: Diagnosis not present

## 2020-09-04 DIAGNOSIS — C450 Mesothelioma of pleura: Secondary | ICD-10-CM | POA: Insufficient documentation

## 2020-09-04 DIAGNOSIS — Z8546 Personal history of malignant neoplasm of prostate: Secondary | ICD-10-CM | POA: Insufficient documentation

## 2020-09-04 DIAGNOSIS — I10 Essential (primary) hypertension: Secondary | ICD-10-CM | POA: Insufficient documentation

## 2020-09-04 LAB — CBC WITH DIFFERENTIAL (CANCER CENTER ONLY)
Abs Immature Granulocytes: 0.02 10*3/uL (ref 0.00–0.07)
Basophils Absolute: 0 10*3/uL (ref 0.0–0.1)
Basophils Relative: 0 %
Eosinophils Absolute: 0 10*3/uL (ref 0.0–0.5)
Eosinophils Relative: 0 %
HCT: 36.9 % — ABNORMAL LOW (ref 39.0–52.0)
Hemoglobin: 13 g/dL (ref 13.0–17.0)
Immature Granulocytes: 0 %
Lymphocytes Relative: 10 %
Lymphs Abs: 0.7 10*3/uL (ref 0.7–4.0)
MCH: 30.9 pg (ref 26.0–34.0)
MCHC: 35.2 g/dL (ref 30.0–36.0)
MCV: 87.6 fL (ref 80.0–100.0)
Monocytes Absolute: 0.3 10*3/uL (ref 0.1–1.0)
Monocytes Relative: 5 %
Neutro Abs: 5.6 10*3/uL (ref 1.7–7.7)
Neutrophils Relative %: 85 %
Platelet Count: 151 10*3/uL (ref 150–400)
RBC: 4.21 MIL/uL — ABNORMAL LOW (ref 4.22–5.81)
RDW: 13.2 % (ref 11.5–15.5)
WBC Count: 6.7 10*3/uL (ref 4.0–10.5)
nRBC: 0 % (ref 0.0–0.2)

## 2020-09-04 LAB — CMP (CANCER CENTER ONLY)
ALT: 21 U/L (ref 0–44)
AST: 23 U/L (ref 15–41)
Albumin: 4 g/dL (ref 3.5–5.0)
Alkaline Phosphatase: 49 U/L (ref 38–126)
Anion gap: 10 (ref 5–15)
BUN: 27 mg/dL — ABNORMAL HIGH (ref 8–23)
CO2: 24 mmol/L (ref 22–32)
Calcium: 9.3 mg/dL (ref 8.9–10.3)
Chloride: 100 mmol/L (ref 98–111)
Creatinine: 1.63 mg/dL — ABNORMAL HIGH (ref 0.61–1.24)
GFR, Estimated: 43 mL/min — ABNORMAL LOW (ref >60)
Glucose, Bld: 367 mg/dL — ABNORMAL HIGH (ref 70–99)
Potassium: 4.6 mmol/L (ref 3.5–5.1)
Sodium: 134 mmol/L — ABNORMAL LOW (ref 135–145)
Total Bilirubin: 0.7 mg/dL (ref 0.3–1.2)
Total Protein: 7.1 g/dL (ref 6.5–8.1)

## 2020-09-04 LAB — TOTAL PROTEIN, URINE DIPSTICK: Protein, ur: 30 mg/dL — AB

## 2020-09-04 MED ORDER — SODIUM CHLORIDE 0.9% FLUSH
10.0000 mL | INTRAVENOUS | Status: DC | PRN
Start: 2020-09-04 — End: 2020-09-04
  Administered 2020-09-04: 10 mL
  Filled 2020-09-04: qty 10

## 2020-09-04 MED ORDER — HEPARIN SOD (PORK) LOCK FLUSH 100 UNIT/ML IV SOLN
500.0000 [IU] | Freq: Once | INTRAVENOUS | Status: DC | PRN
  Filled 2020-09-04: qty 5

## 2020-09-04 MED ORDER — LIDOCAINE-PRILOCAINE 2.5-2.5 % EX CREA: TOPICAL_CREAM | CUTANEOUS | 0 refills | Status: AC

## 2020-09-04 MED ORDER — SODIUM CHLORIDE 0.9% FLUSH
10.0000 mL | Freq: Once | INTRAVENOUS | Status: AC
  Administered 2020-09-04: 10 mL
  Filled 2020-09-04: qty 10

## 2020-09-04 MED ORDER — SODIUM CHLORIDE 0.9 % IV SOLN
Freq: Once | INTRAVENOUS | Status: AC
  Filled 2020-09-04: qty 250

## 2020-09-04 MED ORDER — SODIUM CHLORIDE 0.9 % IV SOLN
15.0000 mg/kg | Freq: Once | INTRAVENOUS | Status: AC
  Administered 2020-09-04: 1300 mg via INTRAVENOUS
  Filled 2020-09-04: qty 48

## 2020-09-04 NOTE — Progress Notes (Signed)
Jack [phone]   [phone]  OFFICE PROGRESS NOTE  Jack Pollen, MD [place] [address]  DIAGNOSIS: Stage I/II malignant pleural mesothelioma involving the left hemithorax diagnosed in September 2021.  PRIOR THERAPY: None.  CURRENT THERAPY:  Systemic chemotherapy with carboplatin for AUC of 5, Alimta 500 mg/M2 and Avastin 15 mg/KG every 3 weeks.  Status post 13 cycles.  Starting from cycle #7 the patient will be on treatment with maintenance Avastin 15 mg/KG every 3 weeks.  INTERVAL HISTORY: Jack Haynes 80 y.o. male returns to the clinic today for follow-up visit accompanied by his girlfriend.  The patient is feeling fine today with no concerning complaints except for occasional shortness of breath with exertion.  He denied having any chest pain, cough or hemoptysis.  He denied having any fever or chills.  He has no nausea, vomiting, diarrhea or constipation.  He has no headache or visual changes.  He has no recent weight loss or night sweats.  He continues to tolerate his maintenance treatment with Avastin fairly well.  The patient is here today for evaluation before starting cycle #14.  MEDICAL HISTORY: Past Medical History:  Diagnosis Date   Arthritis    Cancer ([place])    hx of prostatae cancer   Dyspnea    until chest tube placed   Hypertension    Mesothelioma [facility])     ALLERGIES:  has no allergies on file.  MEDICATIONS:  Current Outpatient Medications  Medication Sig Dispense Refill   acetaminophen (TYLENOL) 500 MG tablet Take 2 tablets (1,000 mg total) by mouth every 6 (six) hours as needed for mild pain or fever. (Patient taking differently: Take 500 mg by mouth every 6 (six) hours as needed for mild pain or fever.) 30 tablet 0   aspirin EC 81 MG tablet Take 81 mg by mouth daily. Swallow whole.     calcium carbonate (TUMS - DOSED IN MG ELEMENTAL CALCIUM) 500 MG chewable tablet Chew 1,000 mg by mouth  daily as needed for indigestion or heartburn.     cetirizine (ZYRTEC) 10 MG tablet Take 10 mg by mouth daily.     dexamethasone (DECADRON) 4 MG tablet 1 tablet p.o. twice daily the day before, day of and day after chemotherapy every 3 weeks (Patient taking differently: Take 4 mg by mouth See admin instructions. 4 mg twice daily the day before, day of and day after chemotherapy every 3 weeks) 40 tablet 1   fluconazole (DIFLUCAN) 100 MG tablet TAKE 1 TABLET BY MOUTH EVERY DAY (Patient taking differently: Take 100 mg by mouth daily as needed (thrush).) 7 tablet 0   folic acid (FOLVITE) 1 MG tablet TAKE 1 TABLET BY MOUTH EVERY DAY 90 tablet 1   lidocaine-prilocaine (EMLA) cream Apply to Port-A-Cath site 30-60-minute before treatment. 30 g 0   losartan-hydrochlorothiazide (HYZAAR) 100-12.5 MG tablet Take 1 tablet by mouth daily. 90 tablet 3   metFORMIN (GLUCOPHAGE) 500 MG tablet Take 1 tablet (500 mg total) by mouth 2 (two) times daily with a meal. 180 tablet 3   ondansetron (ZOFRAN ODT) 4 MG disintegrating tablet Take 1 tablet (4 mg total) by mouth every 8 (eight) hours as needed for nausea or vomiting. Starting 3 days after chemotherapy 30 tablet 1   prochlorperazine (COMPAZINE) 10 MG tablet TAKE 1 TABLET BY MOUTH EVERY 6 HOURS AS NEEDED FOR NAUSEA OR VOMITING. (Patient taking differently: Take 10 mg by mouth every 6 (  six) hours as needed for vomiting or nausea.) 30 tablet 0   rosuvastatin (CRESTOR) 20 MG tablet TAKE 1 TABLET BY MOUTH EVERY DAY 90 tablet 3   simethicone (MYLICON) 003 MG chewable tablet Chew 125 mg by mouth daily as needed (gas).     No current facility-administered medications for this visit.    SURGICAL HISTORY:  Past Surgical History:  Procedure Laterality Date   APPENDECTOMY     BACK SURGERY  1998   disectomy   CHEST TUBE INSERTION Left 11/13/2019   Procedure: INSERTION PLEURAL DRAINAGE CATHETER;  Surgeon: Grace Isaac, MD;  Location: [facility];  Service: Thoracic;   Laterality: Left;   EYE SURGERY Left 1965   injury   IR [place]  05/27/2020   JOINT REPLACEMENT Left 2010   left knee replacement   KNEE SURGERY     PLEURAL BIOPSY Left 11/13/2019   Procedure: PLEURAL BIOPSY;  Surgeon: Grace Isaac, MD;  Location: [facility];  Service: Thoracic;  Laterality: Left;   PORTACATH PLACEMENT Left 11/26/2019   Procedure: INSERTION PORT-A-CATH BARD POWERPORT 9.6 FR CATHETER;  Surgeon: Grace Isaac, MD;  Location: [facility];  Service: Thoracic;  Laterality: Left;   PROSTATE SURGERY     REMOVAL OF PLEURAL DRAINAGE CATHETER Left 11/26/2019   Procedure: REMOVAL OF PLEURAL DRAINAGE CATHETER;  Surgeon: Grace Isaac, MD;  Location: [facility];  Service: Thoracic;  Laterality: Left;   SPINE SURGERY     TALC PLEURODESIS Left 11/13/2019   Procedure: possible TALC PLEURADESIS;  Surgeon: Grace Isaac, MD;  Location: [facility];  Service: Thoracic;  Laterality: Left;   TOTAL KNEE ARTHROPLASTY  02/01/2012   Procedure: TOTAL KNEE ARTHROPLASTY;  Surgeon: Sydnee Cabal, MD;  Location: [facility];  Service: Orthopedics;  Laterality: Left;   VIDEO ASSISTED THORACOSCOPY Left 11/13/2019   Procedure: VIDEO ASSISTED THORACOSCOPY;  Surgeon: Grace Isaac, MD;  Location: [facility];  Service: Thoracic;  Laterality: Left;   VIDEO BRONCHOSCOPY N/A 11/13/2019   Procedure: VIDEO BRONCHOSCOPY;  Surgeon: Grace Isaac, MD;  [facility];  Service: Thoracic;  Laterality: N/A;    REVIEW OF SYSTEMS:  A comprehensive review of systems was negative except for: Constitutional: positive for fatigue Respiratory: positive for dyspnea on exertion   PHYSICAL EXAMINATION: General appearance: alert, cooperative, fatigued, and no distress Head: Normocephalic, without obvious abnormality, atraumatic Neck: no adenopathy, no JVD, supple, symmetrical, trachea midline, and thyroid not enlarged, symmetric, no tenderness/mass/nodules Lymph nodes: Cervical, supraclavicular,  and axillary nodes normal. Resp: clear to auscultation bilaterally Back: symmetric, no curvature. ROM normal. No CVA tenderness. Cardio: regular rate and rhythm, S1, S2 normal, no murmur, click, rub or gallop GI: soft, non-tender; bowel sounds normal; no masses,  no organomegaly Extremities: extremities normal, atraumatic, no cyanosis or edema  ECOG PERFORMANCE STATUS: 1 - Symptomatic but completely ambulatory  Blood pressure (!) 149/92, pulse 90, temperature 97.6 F (36.4 C), temperature source Tympanic, resp. rate 20, height 5\' 7"  (1.702 m), weight 205 lb 1.6 oz (93 kg), SpO2 97 %.  LABORATORY DATA: Lab Results  Component Value Date   WBC 6.7 09/04/2020   HGB 13.0 09/04/2020   HCT 36.9 (L) 09/04/2020   MCV 87.6 09/04/2020   PLT 151 09/04/2020      Chemistry      Component Value Date/Time   NA 136 08/14/2020 0909   NA 141 09/24/2019 0832   K 4.3 08/14/2020 0909   CL 102 08/14/2020 0909  CO2 23 08/14/2020 0909   BUN 27 (H) 08/14/2020 0909   BUN 17 09/24/2019 0832   CREATININE 1.36 (H) 08/14/2020 0909   CREATININE 1.01 12/24/2014 0916      Component Value Date/Time   CALCIUM 9.7 08/14/2020 0909   ALKPHOS 57 08/14/2020 0909   AST 12 (L) 08/14/2020 0909   ALT 12 08/14/2020 0909   BILITOT 0.5 08/14/2020 0909       RADIOGRAPHIC STUDIES: CT Chest W Contrast  Result Date: 08/13/2020 CLINICAL DATA:  Malignant mesothelioma of left hemithorax. Ongoing chemotherapy. EXAM: CT CHEST WITH CONTRAST TECHNIQUE: Multidetector CT imaging of the chest was performed during intravenous contrast administration. CONTRAST:  13mL OMNIPAQUE IOHEXOL 300 MG/ML  SOLN COMPARISON:  06/09/2020 FINDINGS: Cardiovascular: No acute findings. Aortic and coronary atherosclerotic calcification noted. Mediastinum/Nodes: No masses or pathologically enlarged lymph nodes identified. Lungs/Pleura: Diffuse nodular soft tissue thickening is seen throughout the left pleural space, and is stable since previous  study. Largest index area of soft tissue nodularity in the left posterior pleura measures 2.1 x 1.7 cm on image 103/2, without change compared to previous study. No evidence of left pleural effusion. Left lung scarring remains stable, and no suspicious pulmonary nodules or masses are identified. Right lung remains clear. Upper Abdomen:  Unremarkable. Musculoskeletal:  No suspicious bone lesions. IMPRESSION: Stable diffuse nodular soft tissue thickening throughout the left pleural space, consistent with known malignant mesothelioma. No new or progressive disease within the thorax. Aortic Atherosclerosis (ICD10-I70.0). Electronically Signed   By: Marlaine Hind M.D.   On: 08/13/2020 10:44     ASSESSMENT AND PLAN: This is a very pleasant 80 years old white male recently diagnosed with malignant pleural mesothelioma involving the left hemithorax. The patient is currently undergoing systemic chemotherapy with carboplatin for AUC of 5, Alimta 500 mg/M2 and Avastin 15 mg/KG every 3 weeks.  Status post 13 cycles.  Starting from cycle #7 the patient will be on maintenance treatment with Avastin 15 mg/KG every 3 weeks. The patient continues to tolerate his maintenance treatment fairly well with no concerning adverse effects. I recommended for him to proceed with cycle #14 today as planned. I will see him back for follow-up visit in 3 weeks for evaluation before the next cycle of his treatment. He was advised to call immediately if he has any other concerning symptoms in the interval. The patient voices understanding of current disease status and treatment options and is in agreement with the current care plan.  All questions were answered. The patient knows to call the clinic with any problems, questions or concerns. We can certainly see the patient much sooner if necessary.   Disclaimer: This note was dictated with voice recognition software. Similar sounding words can inadvertently be transcribed and may not be  corrected upon review.

## 2020-09-04 NOTE — Progress Notes (Signed)
Pt's creatinine is elevated to 1.63 and his urine is positive for Protein +30.     Per MD ok to treat with Avastin.

## 2020-09-04 NOTE — Progress Notes (Signed)
OK to treat with Avastin 09/04/2020 with elevated crt and urine protein per Dr Julien Nordmann.

## 2020-09-04 NOTE — Patient Instructions (Signed)
[facility]  Discharge Instructions: Thank you for choosing [facility] to provide your oncology and hematology care.   If you have a lab appointment with the [facility], please go directly to the [facility] and check in at the registration area.   Wear comfortable clothing and clothing appropriate for easy access to any Portacath or PICC line.   We strive to give you quality time with your provider. You may need to reschedule your appointment if you arrive late (15 or more minutes).  Arriving late affects you and other patients whose appointments are after yours.  Also, if you miss three or more appointments without notifying the office, you may be dismissed from the clinic at the provider's discretion.      For prescription refill requests, have your pharmacy contact our office and allow 72 hours for refills to be completed.    Today you received the following chemotherapy and/or immunotherapy agents Avastin      To help prevent nausea and vomiting after your treatment, we encourage you to take your nausea medication as directed.  BELOW ARE SYMPTOMS THAT SHOULD BE REPORTED IMMEDIATELY: *FEVER GREATER THAN 100.4 F (38 C) OR HIGHER *CHILLS OR SWEATING *NAUSEA AND VOMITING THAT IS NOT CONTROLLED WITH YOUR NAUSEA MEDICATION *UNUSUAL SHORTNESS OF BREATH *UNUSUAL BRUISING OR BLEEDING *URINARY PROBLEMS (pain or burning when urinating, or frequent urination) *BOWEL PROBLEMS (unusual diarrhea, constipation, pain near the anus) TENDERNESS IN MOUTH AND THROAT WITH OR WITHOUT PRESENCE OF ULCERS (sore throat, sores in mouth, or a toothache) UNUSUAL RASH, SWELLING OR PAIN  UNUSUAL VAGINAL DISCHARGE OR ITCHING   Items with * indicate a potential emergency and should be followed up as soon as possible or go to the Emergency Department if any problems should occur.  Please show the CHEMOTHERAPY ALERT CARD or IMMUNOTHERAPY ALERT CARD at check-in to the  Emergency Department and triage nurse.  Should you have questions after your visit or need to cancel or reschedule your appointment, please contact [facility]  Dept: [phone]  and follow the prompts.  Office hours are 8:00 a.m. to 4:30 p.m. Monday - Friday. Please note that voicemails left after 4:00 p.m. may not be returned until the following business day.  We are closed weekends and major holidays. You have access to a nurse at all times for urgent questions. Please call the main number to the clinic Dept: [phone] and follow the prompts.   For any non-urgent questions, you may also contact your provider using MyChart. We now offer e-Visits for anyone 70 and older to request care online for non-urgent symptoms. For details visit [email or web address].   Also download the MyChart app! Go to the app store, search "MyChart", open the app, select [facility], and log in with your MyChart username and password.  Due to Covid, a mask is required upon entering the hospital/clinic. If you do not have a mask, one will be given to you upon arrival. For doctor visits, patients may have 1 support person aged 68 or older with them. For treatment visits, patients cannot have anyone with them due to current Covid guidelines and our immunocompromised population.

## 2020-09-15 ENCOUNTER — Ambulatory Visit: Payer: PPO | Admitting: Emergency Medicine

## 2020-09-17 ENCOUNTER — Other Ambulatory Visit: Payer: Self-pay | Admitting: Internal Medicine

## 2020-09-25 ENCOUNTER — Inpatient Hospital Stay: Payer: PPO

## 2020-09-25 ENCOUNTER — Inpatient Hospital Stay: Payer: PPO | Attending: Internal Medicine

## 2020-09-25 ENCOUNTER — Inpatient Hospital Stay: Payer: PPO | Admitting: Internal Medicine

## 2020-09-25 ENCOUNTER — Other Ambulatory Visit: Payer: Self-pay

## 2020-09-25 VITALS — BP 103/52 | HR 86 | Temp 97.9°F | Resp 20 | Ht 67.0 in | Wt 208.0 lb

## 2020-09-25 VITALS — BP 156/78 | HR 79 | Resp 18

## 2020-09-25 DIAGNOSIS — Z8546 Personal history of malignant neoplasm of prostate: Secondary | ICD-10-CM | POA: Diagnosis not present

## 2020-09-25 DIAGNOSIS — C450 Mesothelioma of pleura: Secondary | ICD-10-CM | POA: Diagnosis not present

## 2020-09-25 DIAGNOSIS — I10 Essential (primary) hypertension: Secondary | ICD-10-CM | POA: Insufficient documentation

## 2020-09-25 DIAGNOSIS — Z7984 Long term (current) use of oral hypoglycemic drugs: Secondary | ICD-10-CM | POA: Insufficient documentation

## 2020-09-25 DIAGNOSIS — C457 Mesothelioma of other sites: Secondary | ICD-10-CM

## 2020-09-25 DIAGNOSIS — Z5111 Encounter for antineoplastic chemotherapy: Secondary | ICD-10-CM | POA: Insufficient documentation

## 2020-09-25 DIAGNOSIS — R5383 Other fatigue: Secondary | ICD-10-CM | POA: Insufficient documentation

## 2020-09-25 DIAGNOSIS — R0602 Shortness of breath: Secondary | ICD-10-CM | POA: Insufficient documentation

## 2020-09-25 DIAGNOSIS — Z7982 Long term (current) use of aspirin: Secondary | ICD-10-CM | POA: Diagnosis not present

## 2020-09-25 DIAGNOSIS — Z79899 Other long term (current) drug therapy: Secondary | ICD-10-CM | POA: Diagnosis not present

## 2020-09-25 DIAGNOSIS — Z95828 Presence of other vascular implants and grafts: Secondary | ICD-10-CM

## 2020-09-25 LAB — CBC WITH DIFFERENTIAL (CANCER CENTER ONLY)
Abs Immature Granulocytes: 0.03 10*3/uL (ref 0.00–0.07)
Basophils Absolute: 0 10*3/uL (ref 0.0–0.1)
Basophils Relative: 0 %
Eosinophils Absolute: 0 10*3/uL (ref 0.0–0.5)
Eosinophils Relative: 0 %
HCT: 33.4 % — ABNORMAL LOW (ref 39.0–52.0)
Hemoglobin: 11.7 g/dL — ABNORMAL LOW (ref 13.0–17.0)
Immature Granulocytes: 0 %
Lymphocytes Relative: 11 %
Lymphs Abs: 0.8 10*3/uL (ref 0.7–4.0)
MCH: 30.8 pg (ref 26.0–34.0)
MCHC: 35 g/dL (ref 30.0–36.0)
MCV: 87.9 fL (ref 80.0–100.0)
Monocytes Absolute: 0.4 10*3/uL (ref 0.1–1.0)
Monocytes Relative: 6 %
Neutro Abs: 6.3 10*3/uL (ref 1.7–7.7)
Neutrophils Relative %: 83 %
Platelet Count: 147 10*3/uL — ABNORMAL LOW (ref 150–400)
RBC: 3.8 MIL/uL — ABNORMAL LOW (ref 4.22–5.81)
RDW: 13.2 % (ref 11.5–15.5)
WBC Count: 7.5 10*3/uL (ref 4.0–10.5)
nRBC: 0 % (ref 0.0–0.2)

## 2020-09-25 LAB — CMP (CANCER CENTER ONLY)
ALT: 15 U/L (ref 0–44)
AST: 11 U/L — ABNORMAL LOW (ref 15–41)
Albumin: 3.6 g/dL (ref 3.5–5.0)
Alkaline Phosphatase: 59 U/L (ref 38–126)
Anion gap: 11 (ref 5–15)
BUN: 30 mg/dL — ABNORMAL HIGH (ref 8–23)
CO2: 22 mmol/L (ref 22–32)
Calcium: 8.9 mg/dL (ref 8.9–10.3)
Chloride: 102 mmol/L (ref 98–111)
Creatinine: 1.3 mg/dL — ABNORMAL HIGH (ref 0.61–1.24)
GFR, Estimated: 56 mL/min — ABNORMAL LOW (ref >60)
Glucose, Bld: 329 mg/dL — ABNORMAL HIGH (ref 70–99)
Potassium: 4.1 mmol/L (ref 3.5–5.1)
Sodium: 135 mmol/L (ref 135–145)
Total Bilirubin: 0.6 mg/dL (ref 0.3–1.2)
Total Protein: 6.5 g/dL (ref 6.5–8.1)

## 2020-09-25 LAB — TOTAL PROTEIN, URINE DIPSTICK: Protein, ur: 30 mg/dL — AB

## 2020-09-25 MED ORDER — SODIUM CHLORIDE 0.9 % IV SOLN
Freq: Once | INTRAVENOUS | Status: AC
  Filled 2020-09-25: qty 250

## 2020-09-25 MED ORDER — SODIUM CHLORIDE 0.9% FLUSH
10.0000 mL | Freq: Once | INTRAVENOUS | Status: AC
  Administered 2020-09-25: 10 mL
  Filled 2020-09-25: qty 10

## 2020-09-25 MED ORDER — SODIUM CHLORIDE 0.9% FLUSH
10.0000 mL | INTRAVENOUS | Status: DC | PRN
  Administered 2020-09-25: 10 mL
  Filled 2020-09-25: qty 10

## 2020-09-25 MED ORDER — HEPARIN SOD (PORK) LOCK FLUSH 100 UNIT/ML IV SOLN
500.0000 [IU] | Freq: Once | INTRAVENOUS | Status: AC | PRN
  Administered 2020-09-25: 500 [IU]
  Filled 2020-09-25: qty 5

## 2020-09-25 MED ORDER — SODIUM CHLORIDE 0.9 % IV SOLN
15.0000 mg/kg | Freq: Once | INTRAVENOUS | Status: AC
  Administered 2020-09-25: 1300 mg via INTRAVENOUS
  Filled 2020-09-25: qty 4

## 2020-09-25 NOTE — Progress Notes (Signed)
[facility] [phone]   Fax:(336) [phone]  OFFICE PROGRESS NOTE  [name], MD [place] [address]  DIAGNOSIS: Stage I/II malignant pleural mesothelioma involving the left hemithorax diagnosed in September 2021.  PRIOR THERAPY: None.  CURRENT THERAPY:  Systemic chemotherapy with carboplatin for AUC of 5, Alimta 500 mg/M2 and Avastin 15 mg/KG every 3 weeks.  Status post 14 cycles.  Starting from cycle #7 the patient will be on treatment with maintenance Avastin 15 mg/KG every 3 weeks.  INTERVAL HISTORY: Jack Haynes 80 y.o. male returns to the clinic today for follow-up visit accompanied by his girlfriend.  The patient is feeling fine today with no concerning complaints except for fatigue that lasted a little bit longer after this cycle.  He denied having any current chest pain, shortness of breath, cough or hemoptysis.  He denied having any fever or chills.  He has no nausea, vomiting, diarrhea or constipation.  He has no headache or visual changes.  He is here today for evaluation before starting cycle #15 of his treatment.  MEDICAL HISTORY: Past Medical History:  Diagnosis Date   Arthritis    Cancer ([place])    hx of prostatae cancer   Dyspnea    until chest tube placed   Hypertension    Mesothelioma [facility])     ALLERGIES:  has no allergies on file.  MEDICATIONS:  Current Outpatient Medications  Medication Sig Dispense Refill   acetaminophen (TYLENOL) 500 MG tablet Take 2 tablets (1,000 mg total) by mouth every 6 (six) hours as needed for mild pain or fever. (Patient taking differently: Take 500 mg by mouth every 6 (six) hours as needed for mild pain or fever.) 30 tablet 0   aspirin EC 81 MG tablet Take 81 mg by mouth daily. Swallow whole.     calcium carbonate (TUMS - DOSED IN MG ELEMENTAL CALCIUM) 500 MG chewable tablet Chew 1,000 mg by mouth daily as needed for indigestion or heartburn.     cetirizine (ZYRTEC) 10  MG tablet Take 10 mg by mouth daily.     dexamethasone (DECADRON) 4 MG tablet TAKE 1 TABLET BY MOUTH TWICE DAILY THE DAY BEFORE, DAY OF AND DAY AFTER CHEMOTHERAPY EVERY 3 WEEKS 40 tablet 1   fluconazole (DIFLUCAN) 100 MG tablet TAKE 1 TABLET BY MOUTH EVERY DAY (Patient taking differently: Take 100 mg by mouth daily as needed (thrush).) 7 tablet 0   folic acid (FOLVITE) 1 MG tablet TAKE 1 TABLET BY MOUTH EVERY DAY 90 tablet 1   lidocaine-prilocaine (EMLA) cream Apply to Port-A-Cath site 30-60-minute before treatment. 30 g 0   losartan-hydrochlorothiazide (HYZAAR) 100-12.5 MG tablet Take 1 tablet by mouth daily. 90 tablet 3   metFORMIN (GLUCOPHAGE) 500 MG tablet Take 1 tablet (500 mg total) by mouth 2 (two) times daily with a meal. 180 tablet 3   ondansetron (ZOFRAN ODT) 4 MG disintegrating tablet Take 1 tablet (4 mg total) by mouth every 8 (eight) hours as needed for nausea or vomiting. Starting 3 days after chemotherapy 30 tablet 1   prochlorperazine (COMPAZINE) 10 MG tablet TAKE 1 TABLET BY MOUTH EVERY 6 HOURS AS NEEDED FOR NAUSEA OR VOMITING. 30 tablet 0   rosuvastatin (CRESTOR) 20 MG tablet TAKE 1 TABLET BY MOUTH EVERY DAY 90 tablet 3   simethicone (MYLICON) 001 MG chewable tablet Chew 125 mg by mouth daily as needed (gas).     No current facility-administered medications for this  visit.    SURGICAL HISTORY:  Past Surgical History:  Procedure Laterality Date   APPENDECTOMY     BACK SURGERY  1998   disectomy   CHEST TUBE INSERTION Left 11/13/2019   Procedure: INSERTION PLEURAL DRAINAGE CATHETER;  Surgeon: Grace Isaac, MD;  Location: [facility];  Service: Thoracic;  Laterality: Left;   EYE SURGERY Left 1965   injury   IR [place]  05/27/2020   JOINT REPLACEMENT Left 2010   left knee replacement   KNEE SURGERY     PLEURAL BIOPSY Left 11/13/2019   Procedure: PLEURAL BIOPSY;  Surgeon: Grace Isaac, MD;  Location: [facility];  Service: Thoracic;  Laterality:  Left;   PORTACATH PLACEMENT Left 11/26/2019   Procedure: INSERTION PORT-A-CATH BARD POWERPORT 9.6 FR CATHETER;  Surgeon: Grace Isaac, MD;  Location: [facility];  Service: Thoracic;  Laterality: Left;   PROSTATE SURGERY     REMOVAL OF PLEURAL DRAINAGE CATHETER Left 11/26/2019   Procedure: REMOVAL OF PLEURAL DRAINAGE CATHETER;  Surgeon: Grace Isaac, MD;  Location: [facility];  Service: Thoracic;  Laterality: Left;   SPINE SURGERY     TALC PLEURODESIS Left 11/13/2019   Procedure: possible TALC PLEURADESIS;  Surgeon: Grace Isaac, MD;  Location: [facility];  Service: Thoracic;  Laterality: Left;   TOTAL KNEE ARTHROPLASTY  02/01/2012   Procedure: TOTAL KNEE ARTHROPLASTY;  Surgeon: Sydnee Cabal, MD;  [facility];  Service: Orthopedics;  Laterality: Left;   VIDEO ASSISTED THORACOSCOPY Left 11/13/2019   Procedure: VIDEO ASSISTED THORACOSCOPY;  Surgeon: Grace Isaac, MD;  Location: [facility];  Service: Thoracic;  Laterality: Left;   VIDEO BRONCHOSCOPY N/A 11/13/2019   Procedure: VIDEO BRONCHOSCOPY;  Surgeon: Grace Isaac, MD;  Location: [facility];  Service: Thoracic;  Laterality: N/A;    REVIEW OF SYSTEMS:  A comprehensive review of systems was negative except for: Constitutional: positive for fatigue   PHYSICAL EXAMINATION: General appearance: alert, cooperative, fatigued, and no distress Head: Normocephalic, without obvious abnormality, atraumatic Neck: no adenopathy, no JVD, supple, symmetrical, trachea midline, and thyroid not enlarged, symmetric, no tenderness/mass/nodules Lymph nodes: Cervical, supraclavicular, and axillary nodes normal. Resp: clear to auscultation bilaterally Back: symmetric, no curvature. ROM normal. No CVA tenderness. Cardio: regular rate and rhythm, S1, S2 normal, no murmur, click, rub or gallop GI: soft, non-tender; bowel sounds normal; no masses,  no organomegaly Extremities: extremities normal, atraumatic, no cyanosis or edema  ECOG PERFORMANCE STATUS: 1  - Symptomatic but completely ambulatory  Blood pressure (!) 103/52, pulse 86, temperature 97.9 F (36.6 C), temperature source Oral, resp. rate 20, height 5\' 7"  (1.702 m), weight 208 lb (94.3 kg), SpO2 98 %.  LABORATORY DATA: Lab Results  Component Value Date   WBC 6.7 09/04/2020   HGB 13.0 09/04/2020   HCT 36.9 (L) 09/04/2020   MCV 87.6 09/04/2020   PLT 151 09/04/2020      Chemistry      Component Value Date/Time   NA 134 (L) 09/04/2020 0907   NA 141 09/24/2019 0832   K 4.6 09/04/2020 0907   CL 100 09/04/2020 0907   CO2 24 09/04/2020 0907   BUN 27 (H) 09/04/2020 0907   BUN 17 09/24/2019 0832   CREATININE 1.63 (H) 09/04/2020 0907   CREATININE 1.01 12/24/2014 0916      Component Value Date/Time   CALCIUM 9.3 09/04/2020 0907   ALKPHOS 49 09/04/2020 0907   AST 23 09/04/2020 0907   ALT 21 09/04/2020  6160   BILITOT 0.7 09/04/2020 0907       RADIOGRAPHIC STUDIES: No results found.   ASSESSMENT AND PLAN: This is a very pleasant 80 years old white male recently diagnosed with malignant pleural mesothelioma involving the left hemithorax. The patient is currently undergoing systemic chemotherapy with carboplatin for AUC of 5, Alimta 500 mg/M2 and Avastin 15 mg/KG every 3 weeks.  Status post 14 cycles.  Starting from cycle #7 the patient will be on maintenance treatment with Avastin 15 mg/KG every 3 weeks. The patient continues to tolerate this treatment well except for the fatigue. I recommended for him to proceed with cycle #15 today as planned. I will see him back for follow-up visit in 3 weeks for evaluation with repeat CT scan of the chest for restaging of his disease. For the anemia, I recommended for the patient to take over-the-counter oral iron tablet few times a week. He was advised to call immediately if he has any other concerning symptoms in the interval. The patient voices understanding of current disease status and treatment options and is in agreement with the  current care plan.  All questions were answered. The patient knows to call the clinic with any problems, questions or concerns. We can certainly see the patient much sooner if necessary.   Disclaimer: This note was dictated with voice recognition software. Similar sounding words can inadvertently be transcribed and may not be corrected upon review.

## 2020-09-25 NOTE — Patient Instructions (Signed)
[facility]  Discharge Instructions: °Thank you for choosing [facility] to provide your oncology and hematology care.  ° °If you have a lab appointment with the Cancer Center, please go directly to the Cancer Center and check in at the registration area. °  °Wear comfortable clothing and clothing appropriate for easy access to any Portacath or PICC line.  ° °We strive to give you quality time with your provider. You may need to reschedule your appointment if you arrive late (15 or more minutes).  Arriving late affects you and other patients whose appointments are after yours.  Also, if you miss three or more appointments without notifying the office, you may be dismissed from the clinic at the provider’s discretion.    °  °For prescription refill requests, have your pharmacy contact our office and allow 72 hours for refills to be completed.   ° °Today you received the following chemotherapy and/or immunotherapy agents: bevacizumab    °  °To help prevent nausea and vomiting after your treatment, we encourage you to take your nausea medication as directed. ° °BELOW ARE SYMPTOMS THAT SHOULD BE REPORTED IMMEDIATELY: °*FEVER GREATER THAN 100.4 F (38 °C) OR HIGHER °*CHILLS OR SWEATING °*NAUSEA AND VOMITING THAT IS NOT CONTROLLED WITH YOUR NAUSEA MEDICATION °*UNUSUAL SHORTNESS OF BREATH °*UNUSUAL BRUISING OR BLEEDING °*URINARY PROBLEMS (pain or burning when urinating, or frequent urination) °*BOWEL PROBLEMS (unusual diarrhea, constipation, pain near the anus) °TENDERNESS IN MOUTH AND THROAT WITH OR WITHOUT PRESENCE OF ULCERS (sore throat, sores in mouth, or a toothache) °UNUSUAL RASH, SWELLING OR PAIN  °UNUSUAL VAGINAL DISCHARGE OR ITCHING  ° °Items with * indicate a potential emergency and should be followed up as soon as possible or go to the Emergency Department if any problems should occur. ° °Please show the CHEMOTHERAPY ALERT CARD or IMMUNOTHERAPY ALERT CARD at check-in  to the Emergency Department and triage nurse. ° °Should you have questions after your visit or need to cancel or reschedule your appointment, please contact [facility]  Dept: [phone]  and follow the prompts.  Office hours are 8:00 a.m. to 4:30 p.m. Monday - Friday. Please note that voicemails left after 4:00 p.m. may not be returned until the following business day.  We are closed weekends and major holidays. You have access to a nurse at all times for urgent questions. Please call the main number to the clinic Dept: [phone] and follow the prompts. ° ° °For any non-urgent questions, you may also contact your provider using MyChart. We now offer e-Visits for anyone 18 and older to request care online for non-urgent symptoms. For details visit [email or web address]. °  °Also download the MyChart app! Go to the app store, search "MyChart", open the app, select [facility], and log in with your MyChart username and password. ° °Due to Covid, a mask is required upon entering the hospital/clinic. If you do not have a mask, one will be given to you upon arrival. For doctor visits, patients may have 1 support person aged 18 or older with them. For treatment visits, patients cannot have anyone with them due to current Covid guidelines and our immunocompromised population.  ° °

## 2020-10-02 ENCOUNTER — Ambulatory Visit: Payer: PPO

## 2020-10-02 ENCOUNTER — Encounter: Payer: Self-pay | Admitting: Emergency Medicine

## 2020-10-14 ENCOUNTER — Encounter (HOSPITAL_COMMUNITY): Payer: Self-pay

## 2020-10-14 ENCOUNTER — Ambulatory Visit (HOSPITAL_COMMUNITY)
Admission: RE | Admit: 2020-10-14 | Discharge: 2020-10-14 | Disposition: A | Discharge status: Home / Self Care | Payer: PPO | Source: Ambulatory Visit | Attending: Internal Medicine | Admitting: Internal Medicine

## 2020-10-14 ENCOUNTER — Other Ambulatory Visit: Payer: Self-pay

## 2020-10-14 DIAGNOSIS — I700 Atherosclerosis of aorta: Secondary | ICD-10-CM | POA: Diagnosis not present

## 2020-10-14 DIAGNOSIS — C457 Mesothelioma of other sites: Secondary | ICD-10-CM

## 2020-10-14 DIAGNOSIS — R911 Solitary pulmonary nodule: Secondary | ICD-10-CM | POA: Diagnosis not present

## 2020-10-14 MED ORDER — IOHEXOL 350 MG/ML SOLN
80.0000 mL | Freq: Once | INTRAVENOUS | Status: AC | PRN
  Administered 2020-10-14: 80 mL via INTRAVENOUS

## 2020-10-15 ENCOUNTER — Ambulatory Visit (INDEPENDENT_AMBULATORY_CARE_PROVIDER_SITE_OTHER): Payer: PPO | Admitting: Emergency Medicine

## 2020-10-15 ENCOUNTER — Encounter: Payer: Self-pay | Admitting: Emergency Medicine

## 2020-10-15 VITALS — BP 120/70 | HR 92 | Temp 97.8°F | Ht 67.0 in | Wt 208.0 lb

## 2020-10-15 DIAGNOSIS — Z0000 Encounter for general adult medical examination without abnormal findings: Secondary | ICD-10-CM

## 2020-10-15 DIAGNOSIS — I10 Essential (primary) hypertension: Secondary | ICD-10-CM | POA: Diagnosis not present

## 2020-10-15 DIAGNOSIS — C457 Mesothelioma of other sites: Secondary | ICD-10-CM

## 2020-10-15 DIAGNOSIS — Z8546 Personal history of malignant neoplasm of prostate: Secondary | ICD-10-CM | POA: Diagnosis not present

## 2020-10-15 DIAGNOSIS — E1165 Type 2 diabetes mellitus with hyperglycemia: Secondary | ICD-10-CM | POA: Diagnosis not present

## 2020-10-15 DIAGNOSIS — I700 Atherosclerosis of aorta: Secondary | ICD-10-CM

## 2020-10-15 LAB — HEMOGLOBIN A1C: Hgb A1c MFr Bld: 8.4 % — ABNORMAL HIGH (ref 4.6–6.5)

## 2020-10-15 LAB — LIPID PANEL
Cholesterol: 151 mg/dL (ref 0–200)
HDL: 44.2 mg/dL (ref >39.00)
LDL Cholesterol: 78 mg/dL (ref 0–99)
NonHDL: 106.76
Total CHOL/HDL Ratio: 3
Triglycerides: 142 mg/dL (ref 0.0–149.0)
VLDL: 28.4 mg/dL (ref 0.0–40.0)

## 2020-10-15 NOTE — Progress Notes (Signed)
Jack Haynes 80 y.o.   Chief Complaint  Patient presents with   Annual Exam    HISTORY OF PRESENT ILLNESS: This is a 80 y.o. male here for annual exam. Has the following chronic medical problems: 1.  History of mesothelioma.  Sees oncologist on a regular basis.  CT scan of the chest done yesterday.  Report reviewed with patient.  Stable with no new findings. 2.  History of diabetes on metformin 500 mg twice a day Lab Results  Component Value Date   HGBA1C 7.6 (A) 03/13/2020   3.  Hypertension: On Hyzaar 100-12.5 mg daily. 4.  Dyslipidemia: On rosuvastatin 10 mg daily. Doing well.  Has no complaints or medical concerns today.   HPI   Prior to Admission medications   Medication Sig Start Date End Date Taking? Authorizing Provider  acetaminophen (TYLENOL) 500 MG tablet Take 2 tablets (1,000 mg total) by mouth every 6 (six) hours as needed for mild pain or fever. Patient taking differently: Take 500 mg by mouth every 6 (six) hours as needed for mild pain or fever. 11/15/19  Yes Barrett, Lodema Hong, PA-C  aspirin EC 81 MG tablet Take 81 mg by mouth daily. Swallow whole.   Yes [provider]  calcium carbonate (TUMS - DOSED IN MG ELEMENTAL CALCIUM) 500 MG chewable tablet Chew 1,000 mg by mouth daily as needed for indigestion or heartburn.   Yes [provider]  cetirizine (ZYRTEC) 10 MG tablet Take 10 mg by mouth daily.   Yes [provider]  dexamethasone (DECADRON) 4 MG tablet TAKE 1 TABLET BY MOUTH TWICE DAILY THE DAY BEFORE, DAY OF AND DAY AFTER CHEMOTHERAPY EVERY 3 WEEKS 09/17/20  Yes Curt Bears, MD  ferrous sulfate 325 (65 FE) MG tablet Take 325 mg by mouth daily with breakfast.   Yes [provider]  fluconazole (DIFLUCAN) 100 MG tablet TAKE 1 TABLET BY MOUTH EVERY DAY Patient taking differently: Take 100 mg by mouth daily as needed (thrush). 03/04/20  Yes Heilingoetter, Cassandra L, PA-C  lidocaine-prilocaine (EMLA) cream Apply to  Port-A-Cath site 30-60-minute before treatment. 09/04/20  Yes Curt Bears, MD  losartan-hydrochlorothiazide [facility]) 100-12.5 MG tablet Take 1 tablet by mouth daily. 01/15/20  Yes Samier Jaco, Ines Bloomer, MD  metFORMIN (GLUCOPHAGE) 500 MG tablet Take 1 tablet (500 mg total) by mouth 2 (two) times daily with a meal. 03/13/20  Yes Orrie Lascano, Ines Bloomer, MD  ondansetron (ZOFRAN ODT) 4 MG disintegrating tablet Take 1 tablet (4 mg total) by mouth every 8 (eight) hours as needed for nausea or vomiting. Starting 3 days after chemotherapy 02/28/20  Yes Heilingoetter, Cassandra L, PA-C  prochlorperazine (COMPAZINE) 10 MG tablet TAKE 1 TABLET BY MOUTH EVERY 6 HOURS AS NEEDED FOR NAUSEA OR VOMITING. 09/17/20  Yes Curt Bears, MD  rosuvastatin (CRESTOR) 20 MG tablet TAKE 1 TABLET BY MOUTH EVERY DAY 08/01/20  Yes O'Neal, Cassie Freer, MD  simethicone (MYLICON) 342 MG chewable tablet Chew 125 mg by mouth daily as needed (gas).   Yes [provider]    Not on File  Patient Active Problem List   Diagnosis Date Noted   Type 2 diabetes mellitus with hyperglycemia, without long-term current use of insulin ([place]) 06/11/2020   Hypertension 02/28/2020   Port-A-Cath in place 01/09/2020   Mesothelioma of left lung ([place]) 11/23/2019   Encounter for antineoplastic chemotherapy 11/23/2019   S/P Left VATS drainage of pleural effusion, pleural biopsy, placement of Pleur-x catheter 11/15/2019   Atherosclerotic cardiovascular disease 10/29/2019  Pleural effusion 10/29/2019   History of prostate cancer 10/29/2019   Essential hypertension 10/09/2019   Abnormal EKG 01/02/2015   Prostate cancer ([place]) 11/01/2011    Past Medical History:  Diagnosis Date   Arthritis    Cancer ([place])    hx of prostatae cancer   Dyspnea    until chest tube placed   Hypertension    Mesothelioma [facility])     Past Surgical History:  Procedure Laterality Date   APPENDECTOMY     BACK SURGERY  1998   disectomy   CHEST TUBE  INSERTION Left 11/13/2019   Procedure: INSERTION PLEURAL DRAINAGE CATHETER;  Surgeon: Grace Isaac, MD;  Location: [place];  Service: Thoracic;  Laterality: Left;   EYE SURGERY Left 1965   injury   IR [place]  05/27/2020   JOINT REPLACEMENT Left 2010   left knee replacement   KNEE SURGERY     PLEURAL BIOPSY Left 11/13/2019   Procedure: PLEURAL BIOPSY;  Surgeon: Grace Isaac, MD;  Location: [place];  Service: Thoracic;  Laterality: Left;   PORTACATH PLACEMENT Left 11/26/2019   Procedure: INSERTION PORT-A-CATH BARD POWERPORT 9.6 FR CATHETER;  Surgeon: Grace Isaac, MD;  Location: [place];  Service: Thoracic;  Laterality: Left;   PROSTATE SURGERY     REMOVAL OF PLEURAL DRAINAGE CATHETER Left 11/26/2019   Procedure: REMOVAL OF PLEURAL DRAINAGE CATHETER;  Surgeon: Grace Isaac, MD;  Location: [place];  Service: Thoracic;  Laterality: Left;   SPINE SURGERY     TALC PLEURODESIS Left 11/13/2019   Procedure: possible TALC PLEURADESIS;  Surgeon: Grace Isaac, MD;  Location: [place];  Service: Thoracic;  Laterality: Left;   TOTAL KNEE ARTHROPLASTY  02/01/2012   Procedure: TOTAL KNEE ARTHROPLASTY;  Surgeon: Sydnee Cabal, MD;  Location: [facility];  Service: Orthopedics;  Laterality: Left;   VIDEO ASSISTED THORACOSCOPY Left 11/13/2019   Procedure: VIDEO ASSISTED THORACOSCOPY;  Surgeon: Grace Isaac, MD;  Location: [facility] OR;  Service: Thoracic;  Laterality: Left;   VIDEO BRONCHOSCOPY N/A 11/13/2019   Procedure: VIDEO BRONCHOSCOPY;  Surgeon: Grace Isaac, MD;  Location: [facility] OR;  Service: Thoracic;  Laterality: N/A;    Social History   Socioeconomic History   Marital status: Divorced    Spouse name: Not on file   Number of children: 2   Years of education: 12   Highest education level: Not on file  Occupational History   Occupation: retired    Comment: [employer]   Tobacco Use   Smoking status: Former    Years: 15.00    Types:  Cigarettes    Quit date: 02/15/1981    Years since quitting: 39.6   Smokeless tobacco: Former    Types: Chew    Quit date: 12/2018  Vaping Use   Vaping Use: Never used  Substance and Sexual Activity   Alcohol use: Yes    Alcohol/week: 3.0 standard drinks    Types: 3 Shots of liquor per week   Drug use: No   Sexual activity: Not on file  Other Topics Concern   Not on file  Social History Narrative   Not on file   Social Determinants of Health   Financial Resource Strain: Not on file  Food Insecurity: Not on file  Transportation Needs: Not on file  Physical Activity: Not on file  Stress: Not on file  Social Connections: Not on file  Intimate Partner Violence: Not on file  Family History  Problem Relation Age of Onset   Cancer Sister        brain tumor   Heart disease Brother    Alzheimer's disease Brother    Heart disease Sister    Alzheimer's disease Father    Hypertension Sister    Colon cancer Neg Hx    Colon polyps Neg Hx    Esophageal cancer Neg Hx    Rectal cancer Neg Hx    Stomach cancer Neg Hx      Review of Systems  Constitutional: Negative.  Negative for chills and fever.  HENT: Negative.  Negative for congestion and sore throat.   Respiratory: Negative.  Negative for cough and shortness of breath.   Cardiovascular: Negative.  Negative for chest pain and palpitations.  Gastrointestinal:  Negative for abdominal pain, diarrhea, nausea and vomiting.  Genitourinary: Negative.  Negative for dysuria and hematuria.  Skin: Negative.  Negative for rash.  Neurological:  Negative for dizziness and headaches.  All other systems reviewed and are negative.  Today's Vitals   10/15/20 1354  BP: 140/78  Pulse: 92  Temp: 97.8 F (36.6 C)  TempSrc: Oral  SpO2: 96%  Weight: 208 lb (94.3 kg)  Height: 5\' 7"  (1.702 m)   Body mass index is 32.58 kg/m. Wt Readings from Last 3 Encounters:  10/15/20 208 lb (94.3 kg)  09/25/20 208 lb (94.3 kg)  09/04/20 205 lb  1.6 oz (93 kg)    Physical Exam Vitals reviewed.  Constitutional:      Appearance: Normal appearance.  HENT:     Head: Normocephalic.     Right Ear: Tympanic membrane, ear canal and external ear normal.     Left Ear: Tympanic membrane, ear canal and external ear normal.     Mouth/Throat:     Mouth: Mucous membranes are moist.     Pharynx: Oropharynx is clear.  Eyes:     Extraocular Movements: Extraocular movements intact.     Conjunctiva/sclera: Conjunctivae normal.     Pupils: Pupils are equal, round, and reactive to light.  Cardiovascular:     Rate and Rhythm: Normal rate and regular rhythm.     Pulses: Normal pulses.     Heart sounds: Normal heart sounds.  Pulmonary:     Effort: Pulmonary effort is normal.     Breath sounds: Normal breath sounds.  Abdominal:     General: Bowel sounds are normal. There is no distension.     Palpations: Abdomen is soft.     Tenderness: There is no abdominal tenderness.  Musculoskeletal:        General: Normal range of motion.     Cervical back: Normal range of motion and neck supple.  Skin:    General: Skin is warm and dry.     Capillary Refill: Capillary refill takes less than 2 seconds.  Neurological:     General: No focal deficit present.     Mental Status: He is alert and oriented to person, place, and time.  Psychiatric:        Mood and Affect: Mood normal.        Behavior: Behavior normal.     ASSESSMENT & PLAN: Jack Haynes was seen today for annual exam.  Diagnoses and all orders for this visit:  Routine general medical examination at a health care facility  Atherosclerosis of aorta ([place]) -     Lipid panel  Mesothelioma of left lung [facility])  Essential hypertension  Type 2 diabetes mellitus with hyperglycemia,  without long-term current use of insulin ([facility]) -     Hemoglobin A1c  History of prostate cancer  Modifiable risk factors discussed with patient. Anticipatory guidance according to age provided. The following  topics were also discussed: Social Determinants of Health Smoking Diet and nutrition Benefits of exercise Cancer screening and review of most recent colonoscopy report and echocardiogram report Review of yesterday's CT scan of the chest Vaccinations recommendations Cardiovascular risk assessment The 10-year ASCVD risk score Mikey Bussing DC Jr., et al., 2013) is: 54.2%   Values used to calculate the score:     Age: 74 years     Sex: Male     Is Non-Hispanic African American: No     Diabetic: Yes     Tobacco smoker: No     Systolic Blood Pressure: 482 mmHg     Is BP treated: Yes     HDL Cholesterol: 40 mg/dL     Total Cholesterol: 184 mg/dL  Mental health including depression and anxiety Fall and accident prevention  Patient Instructions  Health Maintenance, Male Adopting a healthy lifestyle and getting preventive care are important in promoting health and wellness. Ask your health care provider about: The right schedule for you to have regular tests and exams. Things you can do on your own to prevent diseases and keep yourself healthy. What should I know about diet, weight, and exercise? Eat a healthy diet  Eat a diet that includes plenty of vegetables, fruits, low-fat dairy products, and lean protein. Do not eat a lot of foods that are high in solid fats, added sugars, or sodium. Maintain a healthy weight Body mass index (BMI) is a measurement that can be used to identify possible weight problems. It estimates body fat based on height and weight. Your health care provider can help determine your BMI and help you achieve or maintain a healthy weight. Get regular exercise Get regular exercise. This is one of the most important things you can do for your health. Most adults should: Exercise for at least 150 minutes each week. The exercise should increase your heart rate and make you sweat (moderate-intensity exercise). Do strengthening exercises at least twice a week. This is in addition  to the moderate-intensity exercise. Spend less time sitting. Even light physical activity can be beneficial. Watch cholesterol and blood lipids Have your blood tested for lipids and cholesterol at 80 years of age, then have this test every 5 years. You may need to have your cholesterol levels checked more often if: Your lipid or cholesterol levels are high. You are older than 79 years of age. You are at high risk for heart disease. What should I know about cancer screening? Many types of cancers can be detected early and may often be prevented. Depending on your health history and family history, you may need to have cancer screening at various ages. This may include screening for: Colorectal cancer. Prostate cancer. Skin cancer. Lung cancer. What should I know about heart disease, diabetes, and high blood pressure? Blood pressure and heart disease High blood pressure causes heart disease and increases the risk of stroke. This is more likely to develop in people who have high blood pressure readings, are of African descent, or are overweight. Talk with your health care provider about your target blood pressure readings. Have your blood pressure checked: Every 3-5 years if you are 75-62 years of age. Every year if you are 1 years old or older. If you are between the ages of 76 and  69 and are a current or former smoker, ask your health care provider if you should have a one-time screening for abdominal aortic aneurysm (AAA). Diabetes Have regular diabetes screenings. This checks your fasting blood sugar level. Have the screening done: Once every three years after age 23 if you are at a normal weight and have a low risk for diabetes. More often and at a younger age if you are overweight or have a high risk for diabetes. What should I know about preventing infection? Hepatitis B If you have a higher risk for hepatitis B, you should be screened for this virus. Talk with your health care  provider to find out if you are at risk for hepatitis B infection. Hepatitis C Blood testing is recommended for: Everyone born from [birth year] through 1965. Anyone with known risk factors for hepatitis C. Sexually transmitted infections (STIs) You should be screened each year for STIs, including gonorrhea and chlamydia, if: You are sexually active and are younger than 80 years of age. You are older than 80 years of age and your health care provider tells you that you are at risk for this type of infection. Your sexual activity has changed since you were last screened, and you are at increased risk for chlamydia or gonorrhea. Ask your health care provider if you are at risk. Ask your health care provider about whether you are at high risk for HIV. Your health care provider may recommend a prescription medicine to help prevent HIV infection. If you choose to take medicine to prevent HIV, you should first get tested for HIV. You should then be tested every 3 months for as long as you are taking the medicine. Follow these instructions at home: Lifestyle Do not use any products that contain nicotine or tobacco, such as cigarettes, e-cigarettes, and chewing tobacco. If you need help quitting, ask your health care provider. Do not use street drugs. Do not share needles. Ask your health care provider for help if you need support or information about quitting drugs. Alcohol use Do not drink alcohol if your health care provider tells you not to drink. If you drink alcohol: Limit how much you have to 0-2 drinks a day. Be aware of how much alcohol is in your drink. In the [place] one drink equals one 12 oz bottle of beer (355 mL), one 5 oz glass of wine (148 mL), or one 1 oz glass of hard liquor (44 mL). General instructions Schedule regular health, dental, and eye exams. Stay current with your vaccines. Tell your health care provider if: You often feel depressed. You have ever been abused or do not feel  safe at home. Summary Adopting a healthy lifestyle and getting preventive care are important in promoting health and wellness. Follow your health care provider's instructions about healthy diet, exercising, and getting tested or screened for diseases. Follow your health care provider's instructions on monitoring your cholesterol and blood pressure. This information is not intended to replace advice given to you by your health care provider. Make sure you discuss any questions you have with your health care provider. Document Revised: 04/11/2020 Document Reviewed: 01/25/2018 Elsevier Patient Education  2022 Ray[place], MD [facility]

## 2020-10-15 NOTE — Patient Instructions (Signed)
Health Maintenance, Male Adopting a healthy lifestyle and getting preventive care are important in promoting health and wellness. Ask your health care provider about: The right schedule for you to have regular tests and exams. Things you can do on your own to prevent diseases and keep yourself healthy. What should I know about diet, weight, and exercise? Eat a healthy diet  Eat a diet that includes plenty of vegetables, fruits, low-fat dairy products, and lean protein. Do not eat a lot of foods that are high in solid fats, added sugars, or sodium. Maintain a healthy weight Body mass index (BMI) is a measurement that can be used to identify possible weight problems. It estimates body fat based on height and weight. Your health care provider can help determine your BMI and help you achieve or maintain a healthy weight. Get regular exercise Get regular exercise. This is one of the most important things you can do for your health. Most adults should: Exercise for at least 150 minutes each week. The exercise should increase your heart rate and make you sweat (moderate-intensity exercise). Do strengthening exercises at least twice a week. This is in addition to the moderate-intensity exercise. Spend less time sitting. Even light physical activity can be beneficial. Watch cholesterol and blood lipids Have your blood tested for lipids and cholesterol at 80 years of age, then have this test every 5 years. You may need to have your cholesterol levels checked more often if: Your lipid or cholesterol levels are high. You are older than 80 years of age. You are at high risk for heart disease. What should I know about cancer screening? Many types of cancers can be detected early and may often be prevented. Depending on your health history and family history, you may need to have cancer screening at various ages. This may include screening for: Colorectal cancer. Prostate cancer. Skin cancer. Lung  cancer. What should I know about heart disease, diabetes, and high blood pressure? Blood pressure and heart disease High blood pressure causes heart disease and increases the risk of stroke. This is more likely to develop in people who have high blood pressure readings, are of African descent, or are overweight. Talk with your health care provider about your target blood pressure readings. Have your blood pressure checked: Every 3-5 years if you are 18-39 years of age. Every year if you are 40 years old or older. If you are between the ages of 65 and 75 and are a current or former smoker, ask your health care provider if you should have a one-time screening for abdominal aortic aneurysm (AAA). Diabetes Have regular diabetes screenings. This checks your fasting blood sugar level. Have the screening done: Once every three years after age 45 if you are at a normal weight and have a low risk for diabetes. More often and at a younger age if you are overweight or have a high risk for diabetes. What should I know about preventing infection? Hepatitis B If you have a higher risk for hepatitis B, you should be screened for this virus. Talk with your health care provider to find out if you are at risk for hepatitis B infection. Hepatitis C Blood testing is recommended for: Everyone born from 1945 through 1965. Anyone with known risk factors for hepatitis C. Sexually transmitted infections (STIs) You should be screened each year for STIs, including gonorrhea and chlamydia, if: You are sexually active and are younger than 80 years of age. You are older than 80 years   of age and your health care provider tells you that you are at risk for this type of infection. Your sexual activity has changed since you were last screened, and you are at increased risk for chlamydia or gonorrhea. Ask your health care provider if you are at risk. Ask your health care provider about whether you are at high risk for HIV.  Your health care provider may recommend a prescription medicine to help prevent HIV infection. If you choose to take medicine to prevent HIV, you should first get tested for HIV. You should then be tested every 3 months for as long as you are taking the medicine. Follow these instructions at home: Lifestyle Do not use any products that contain nicotine or tobacco, such as cigarettes, e-cigarettes, and chewing tobacco. If you need help quitting, ask your health care provider. Do not use street drugs. Do not share needles. Ask your health care provider for help if you need support or information about quitting drugs. Alcohol use Do not drink alcohol if your health care provider tells you not to drink. If you drink alcohol: Limit how much you have to 0-2 drinks a day. Be aware of how much alcohol is in your drink. In the [place] one drink equals one 12 oz bottle of beer (355 mL), one 5 oz glass of wine (148 mL), or one 1 oz glass of hard liquor (44 mL). General instructions Schedule regular health, dental, and eye exams. Stay current with your vaccines. Tell your health care provider if: You often feel depressed. You have ever been abused or do not feel safe at home. Summary Adopting a healthy lifestyle and getting preventive care are important in promoting health and wellness. Follow your health care provider's instructions about healthy diet, exercising, and getting tested or screened for diseases. Follow your health care provider's instructions on monitoring your cholesterol and blood pressure. This information is not intended to replace advice given to you by your health care provider. Make sure you discuss any questions you have with your health care provider. Document Revised: 04/11/2020 Document Reviewed: 01/25/2018 Elsevier Patient Education  2022 [employer]vier Inc.  

## 2020-10-16 ENCOUNTER — Other Ambulatory Visit: Payer: Self-pay

## 2020-10-16 ENCOUNTER — Inpatient Hospital Stay: Payer: PPO

## 2020-10-16 ENCOUNTER — Inpatient Hospital Stay: Payer: PPO | Attending: Internal Medicine

## 2020-10-16 ENCOUNTER — Inpatient Hospital Stay: Payer: PPO | Admitting: Internal Medicine

## 2020-10-16 ENCOUNTER — Other Ambulatory Visit: Payer: Self-pay | Admitting: Emergency Medicine

## 2020-10-16 VITALS — BP 152/81 | HR 88 | Temp 98.3°F | Resp 19 | Ht 67.0 in | Wt 208.6 lb

## 2020-10-16 DIAGNOSIS — Z7984 Long term (current) use of oral hypoglycemic drugs: Secondary | ICD-10-CM | POA: Insufficient documentation

## 2020-10-16 DIAGNOSIS — Z7982 Long term (current) use of aspirin: Secondary | ICD-10-CM | POA: Diagnosis not present

## 2020-10-16 DIAGNOSIS — Z5111 Encounter for antineoplastic chemotherapy: Secondary | ICD-10-CM

## 2020-10-16 DIAGNOSIS — Z79899 Other long term (current) drug therapy: Secondary | ICD-10-CM | POA: Insufficient documentation

## 2020-10-16 DIAGNOSIS — R5383 Other fatigue: Secondary | ICD-10-CM | POA: Insufficient documentation

## 2020-10-16 DIAGNOSIS — I10 Essential (primary) hypertension: Secondary | ICD-10-CM | POA: Diagnosis not present

## 2020-10-16 DIAGNOSIS — Z5112 Encounter for antineoplastic immunotherapy: Secondary | ICD-10-CM | POA: Diagnosis not present

## 2020-10-16 DIAGNOSIS — D649 Anemia, unspecified: Secondary | ICD-10-CM | POA: Diagnosis not present

## 2020-10-16 DIAGNOSIS — C457 Mesothelioma of other sites: Secondary | ICD-10-CM | POA: Diagnosis not present

## 2020-10-16 DIAGNOSIS — C450 Mesothelioma of pleura: Secondary | ICD-10-CM | POA: Insufficient documentation

## 2020-10-16 DIAGNOSIS — Z95828 Presence of other vascular implants and grafts: Secondary | ICD-10-CM

## 2020-10-16 LAB — CBC WITH DIFFERENTIAL (CANCER CENTER ONLY)
Abs Immature Granulocytes: 0.05 10*3/uL (ref 0.00–0.07)
Basophils Absolute: 0 10*3/uL (ref 0.0–0.1)
Basophils Relative: 0 %
Eosinophils Absolute: 0 10*3/uL (ref 0.0–0.5)
Eosinophils Relative: 0 %
HCT: 33.9 % — ABNORMAL LOW (ref 39.0–52.0)
Hemoglobin: 12.2 g/dL — ABNORMAL LOW (ref 13.0–17.0)
Immature Granulocytes: 1 %
Lymphocytes Relative: 9 %
Lymphs Abs: 0.7 10*3/uL (ref 0.7–4.0)
MCH: 31.3 pg (ref 26.0–34.0)
MCHC: 36 g/dL (ref 30.0–36.0)
MCV: 86.9 fL (ref 80.0–100.0)
Monocytes Absolute: 0.4 10*3/uL (ref 0.1–1.0)
Monocytes Relative: 5 %
Neutro Abs: 6.5 10*3/uL (ref 1.7–7.7)
Neutrophils Relative %: 85 %
Platelet Count: 141 10*3/uL — ABNORMAL LOW (ref 150–400)
RBC: 3.9 MIL/uL — ABNORMAL LOW (ref 4.22–5.81)
RDW: 13.3 % (ref 11.5–15.5)
WBC Count: 7.7 10*3/uL (ref 4.0–10.5)
nRBC: 0 % (ref 0.0–0.2)

## 2020-10-16 LAB — CMP (CANCER CENTER ONLY)
ALT: 16 U/L (ref 0–44)
AST: 11 U/L — ABNORMAL LOW (ref 15–41)
Albumin: 3.7 g/dL (ref 3.5–5.0)
Alkaline Phosphatase: 55 U/L (ref 38–126)
Anion gap: 12 (ref 5–15)
BUN: 19 mg/dL (ref 8–23)
CO2: 23 mmol/L (ref 22–32)
Calcium: 9.1 mg/dL (ref 8.9–10.3)
Chloride: 101 mmol/L (ref 98–111)
Creatinine: 1.26 mg/dL — ABNORMAL HIGH (ref 0.61–1.24)
GFR, Estimated: 58 mL/min — ABNORMAL LOW (ref >60)
Glucose, Bld: 341 mg/dL — ABNORMAL HIGH (ref 70–99)
Potassium: 4.1 mmol/L (ref 3.5–5.1)
Sodium: 136 mmol/L (ref 135–145)
Total Bilirubin: 0.6 mg/dL (ref 0.3–1.2)
Total Protein: 6.6 g/dL (ref 6.5–8.1)

## 2020-10-16 LAB — TOTAL PROTEIN, URINE DIPSTICK: Protein, ur: 100 mg/dL — AB

## 2020-10-16 MED ORDER — SODIUM CHLORIDE 0.9 % IV SOLN
Freq: Once | INTRAVENOUS | Status: AC
Start: 2020-10-16 — End: 2020-10-16

## 2020-10-16 MED ORDER — SODIUM CHLORIDE 0.9% FLUSH
10.0000 mL | INTRAVENOUS | Status: DC | PRN
  Administered 2020-10-16: 10 mL

## 2020-10-16 MED ORDER — SODIUM CHLORIDE 0.9 % IV SOLN
15.0000 mg/kg | Freq: Once | INTRAVENOUS | Status: AC
  Administered 2020-10-16: 1300 mg via INTRAVENOUS
  Filled 2020-10-16: qty 48

## 2020-10-16 MED ORDER — EMPAGLIFLOZIN 10 MG PO TABS: 10.0000 mg | ORAL_TABLET | Freq: Every day | ORAL | 1 refills | Status: AC

## 2020-10-16 MED ORDER — HEPARIN SOD (PORK) LOCK FLUSH 100 UNIT/ML IV SOLN
500.0000 [IU] | Freq: Once | INTRAVENOUS | Status: AC | PRN
  Administered 2020-10-16: 500 [IU]

## 2020-10-16 MED ORDER — SODIUM CHLORIDE 0.9% FLUSH
10.0000 mL | Freq: Once | INTRAVENOUS | Status: AC
  Administered 2020-10-16: 10 mL

## 2020-10-16 NOTE — Progress Notes (Signed)
Call patient please.  Elevated glucose and hemoglobin A1c meaning diabetes is uncontrolled.  Continue metformin and start Jardiance 10 mg daily.  New prescription sent to pharmacy of record.  Thanks.

## 2020-10-16 NOTE — Patient Instructions (Signed)
[facility]  Discharge Instructions: °Thank you for choosing [facility] to provide your oncology and hematology care.  ° °If you have a lab appointment with the Cancer Center, please go directly to the Cancer Center and check in at the registration area. °  °Wear comfortable clothing and clothing appropriate for easy access to any Portacath or PICC line.  ° °We strive to give you quality time with your provider. You may need to reschedule your appointment if you arrive late (15 or more minutes).  Arriving late affects you and other patients whose appointments are after yours.  Also, if you miss three or more appointments without notifying the office, you may be dismissed from the clinic at the provider’s discretion.    °  °For prescription refill requests, have your pharmacy contact our office and allow 72 hours for refills to be completed.   ° °Today you received the following chemotherapy and/or immunotherapy agents: bevacizumab    °  °To help prevent nausea and vomiting after your treatment, we encourage you to take your nausea medication as directed. ° °BELOW ARE SYMPTOMS THAT SHOULD BE REPORTED IMMEDIATELY: °*FEVER GREATER THAN 100.4 F (38 °C) OR HIGHER °*CHILLS OR SWEATING °*NAUSEA AND VOMITING THAT IS NOT CONTROLLED WITH YOUR NAUSEA MEDICATION °*UNUSUAL SHORTNESS OF BREATH °*UNUSUAL BRUISING OR BLEEDING °*URINARY PROBLEMS (pain or burning when urinating, or frequent urination) °*BOWEL PROBLEMS (unusual diarrhea, constipation, pain near the anus) °TENDERNESS IN MOUTH AND THROAT WITH OR WITHOUT PRESENCE OF ULCERS (sore throat, sores in mouth, or a toothache) °UNUSUAL RASH, SWELLING OR PAIN  °UNUSUAL VAGINAL DISCHARGE OR ITCHING  ° °Items with * indicate a potential emergency and should be followed up as soon as possible or go to the Emergency Department if any problems should occur. ° °Please show the CHEMOTHERAPY ALERT CARD or IMMUNOTHERAPY ALERT CARD at check-in  to the Emergency Department and triage nurse. ° °Should you have questions after your visit or need to cancel or reschedule your appointment, please contact [facility]  Dept: [phone]  and follow the prompts.  Office hours are 8:00 a.m. to 4:30 p.m. Monday - Friday. Please note that voicemails left after 4:00 p.m. may not be returned until the following business day.  We are closed weekends and major holidays. You have access to a nurse at all times for urgent questions. Please call the main number to the clinic Dept: [phone] and follow the prompts. ° ° °For any non-urgent questions, you may also contact your provider using MyChart. We now offer e-Visits for anyone 18 and older to request care online for non-urgent symptoms. For details visit [email or web address]. °  °Also download the MyChart app! Go to the app store, search "MyChart", open the app, select [facility], and log in with your MyChart username and password. ° °Due to Covid, a mask is required upon entering the hospital/clinic. If you do not have a mask, one will be given to you upon arrival. For doctor visits, patients may have 1 support person aged 18 or older with them. For treatment visits, patients cannot have anyone with them due to current Covid guidelines and our immunocompromised population.  ° °

## 2020-10-16 NOTE — Progress Notes (Signed)
Per Dr. Julien Nordmann, ok to treat with elevated Urine Protein

## 2020-10-16 NOTE — Progress Notes (Signed)
[facility] [phone]   [phone]  OFFICE PROGRESS NOTE  [name], MD [place] [address]  DIAGNOSIS: Stage I/II malignant pleural mesothelioma involving the left hemithorax diagnosed in September 2021.  PRIOR THERAPY: None.  CURRENT THERAPY:  Systemic chemotherapy with carboplatin for AUC of 5, Alimta 500 mg/M2 and Avastin 15 mg/KG every 3 weeks.  Status post 14 cycles.  Starting from cycle #7 the patient will be on treatment with maintenance Avastin 15 mg/KG every 3 weeks.  INTERVAL HISTORY: Jack Haynes 80 y.o. male returns to the clinic today for follow-up visit accompanied by his girlfriend.  The patient is feeling fine today with no concerning complaints.  He denied having any fatigue or weakness.  He denied having any chest pain, shortness of breath, cough or hemoptysis.  He has no nausea, vomiting, diarrhea or constipation.  He denied having any headache or visual changes.  He has no weight loss or night sweats.  He continues to tolerate his treatment with maintenance Avastin fairly well.  The patient had repeat CT scan of the chest performed recently and he is here for evaluation and discussion of his discuss results.  MEDICAL HISTORY: Past Medical History:  Diagnosis Date   Arthritis    Cancer ([place])    hx of prostatae cancer   Dyspnea    until chest tube placed   Hypertension    Mesothelioma [facility])     ALLERGIES:  has no allergies on file.  MEDICATIONS:  Current Outpatient Medications  Medication Sig Dispense Refill   acetaminophen (TYLENOL) 500 MG tablet Take 2 tablets (1,000 mg total) by mouth every 6 (six) hours as needed for mild pain or fever. (Patient taking differently: Take 500 mg by mouth every 6 (six) hours as needed for mild pain or fever.) 30 tablet 0   aspirin EC 81 MG tablet Take 81 mg by mouth daily. Swallow whole.     calcium carbonate (TUMS - DOSED IN MG ELEMENTAL CALCIUM) 500 MG  chewable tablet Chew 1,000 mg by mouth daily as needed for indigestion or heartburn.     cetirizine (ZYRTEC) 10 MG tablet Take 10 mg by mouth daily.     dexamethasone (DECADRON) 4 MG tablet TAKE 1 TABLET BY MOUTH TWICE DAILY THE DAY BEFORE, DAY OF AND DAY AFTER CHEMOTHERAPY EVERY 3 WEEKS 40 tablet 1   ferrous sulfate 325 (65 FE) MG tablet Take 325 mg by mouth daily with breakfast.     fluconazole (DIFLUCAN) 100 MG tablet TAKE 1 TABLET BY MOUTH EVERY DAY (Patient taking differently: Take 100 mg by mouth daily as needed (thrush).) 7 tablet 0   lidocaine-prilocaine (EMLA) cream Apply to Port-A-Cath site 30-60-minute before treatment. 30 g 0   losartan-hydrochlorothiazide (HYZAAR) 100-12.5 MG tablet Take 1 tablet by mouth daily. 90 tablet 3   metFORMIN (GLUCOPHAGE) 500 MG tablet Take 1 tablet (500 mg total) by mouth 2 (two) times daily with a meal. 180 tablet 3   ondansetron (ZOFRAN ODT) 4 MG disintegrating tablet Take 1 tablet (4 mg total) by mouth every 8 (eight) hours as needed for nausea or vomiting. Starting 3 days after chemotherapy 30 tablet 1   prochlorperazine (COMPAZINE) 10 MG tablet TAKE 1 TABLET BY MOUTH EVERY 6 HOURS AS NEEDED FOR NAUSEA OR VOMITING. 30 tablet 0   rosuvastatin (CRESTOR) 20 MG tablet TAKE 1 TABLET BY MOUTH EVERY DAY 90 tablet 3   simethicone (MYLICON) 878 MG chewable  tablet Chew 125 mg by mouth daily as needed (gas).     No current facility-administered medications for this visit.    SURGICAL HISTORY:  Past Surgical History:  Procedure Laterality Date   APPENDECTOMY     BACK SURGERY  1998   disectomy   CHEST TUBE INSERTION Left 11/13/2019   Procedure: INSERTION PLEURAL DRAINAGE CATHETER;  Surgeon: Grace Isaac, MD;  Location: [facility];  Service: Thoracic;  Laterality: Left;   EYE SURGERY Left 1965   injury   IR [facility]  05/27/2020   JOINT REPLACEMENT Left 2010   left knee replacement   KNEE SURGERY     PLEURAL BIOPSY Left  11/13/2019   Procedure: PLEURAL BIOPSY;  Surgeon: Grace Isaac, MD;  Location: [facility];  Service: Thoracic;  Laterality: Left;   PORTACATH PLACEMENT Left 11/26/2019   Procedure: INSERTION PORT-A-CATH BARD POWERPORT 9.6 FR CATHETER;  Surgeon: Grace Isaac, MD;  Location: [facility];  Service: Thoracic;  Laterality: Left;   PROSTATE SURGERY     REMOVAL OF PLEURAL DRAINAGE CATHETER Left 11/26/2019   Procedure: REMOVAL OF PLEURAL DRAINAGE CATHETER;  Surgeon: Grace Isaac, MD;  Location: [facility];  Service: Thoracic;  Laterality: Left;   SPINE SURGERY     TALC PLEURODESIS Left 11/13/2019   Procedure: possible TALC PLEURADESIS;  Surgeon: Grace Isaac, MD;  Location: [facility];  Service: Thoracic;  Laterality: Left;   TOTAL KNEE ARTHROPLASTY  02/01/2012   Procedure: TOTAL KNEE ARTHROPLASTY;  Surgeon: Sydnee Cabal, MD;  Location: [facility];  Service: Orthopedics;  Laterality: Left;   VIDEO ASSISTED THORACOSCOPY Left 11/13/2019   Procedure: VIDEO ASSISTED THORACOSCOPY;  Surgeon: Grace Isaac, MD;  Location: [facility];  Service: Thoracic;  Laterality: Left;   VIDEO BRONCHOSCOPY N/A 11/13/2019   Procedure: VIDEO BRONCHOSCOPY;  Surgeon: Grace Isaac, MD;  Location: [facility];  Service: Thoracic;  Laterality: N/A;    REVIEW OF SYSTEMS:  Constitutional: negative Eyes: negative Ears, nose, mouth, throat, and face: negative Respiratory: negative Cardiovascular: negative Gastrointestinal: negative Genitourinary:negative Integument/breast: negative Hematologic/lymphatic: negative Musculoskeletal:negative Neurological: negative Behavioral/Psych: negative Endocrine: negative Allergic/Immunologic: negative   PHYSICAL EXAMINATION: General appearance: alert, cooperative, fatigued, and no distress Head: Normocephalic, without obvious abnormality, atraumatic Neck: no adenopathy, no JVD, supple, symmetrical, trachea midline, and thyroid not enlarged, symmetric, no tenderness/mass/nodules Lymph  nodes: Cervical, supraclavicular, and axillary nodes normal. Resp: clear to auscultation bilaterally Back: symmetric, no curvature. ROM normal. No CVA tenderness. Cardio: regular rate and rhythm, S1, S2 normal, no murmur, click, rub or gallop GI: soft, non-tender; bowel sounds normal; no masses,  no organomegaly Extremities: extremities normal, atraumatic, no cyanosis or edema Neurologic: Alert and oriented X 3, normal strength and tone. Normal symmetric reflexes. Normal coordination and gait  ECOG PERFORMANCE STATUS: 1 - Symptomatic but completely ambulatory  Blood pressure (!) 152/81, pulse 88, temperature 98.3 F (36.8 C), temperature source Oral, resp. rate 19, height 5\' 7"  (1.702 m), weight 208 lb 9.6 oz (94.6 kg), SpO2 97 %.  LABORATORY DATA: Lab Results  Component Value Date   WBC 7.5 09/25/2020   HGB 11.7 (L) 09/25/2020   HCT 33.4 (L) 09/25/2020   MCV 87.9 09/25/2020   PLT 147 (L) 09/25/2020      Chemistry      Component Value Date/Time   NA 135 09/25/2020 0808   NA 141 09/24/2019 0832   K 4.1 09/25/2020 0808   CL 102 09/25/2020 0808   CO2 22 09/25/2020  0808   BUN 30 (H) 09/25/2020 0808   BUN 17 09/24/2019 0832   CREATININE 1.30 (H) 09/25/2020 0808   CREATININE 1.01 12/24/2014 0916      Component Value Date/Time   CALCIUM 8.9 09/25/2020 0808   ALKPHOS 59 09/25/2020 0808   AST 11 (L) 09/25/2020 0808   ALT 15 09/25/2020 0808   BILITOT 0.6 09/25/2020 9528       RADIOGRAPHIC STUDIES: CT Chest W Contrast  Result Date: 10/15/2020 CLINICAL DATA:  History of mesothelioma of the left chest. Restaging. EXAM: CT CHEST WITH CONTRAST TECHNIQUE: Multidetector CT imaging of the chest was performed during intravenous contrast administration. CONTRAST:  36mL OMNIPAQUE IOHEXOL 350 MG/ML SOLN COMPARISON:  08/12/2020 FINDINGS: Cardiovascular: The heart size is normal. No substantial pericardial effusion. Coronary artery calcification is evident. Mild atherosclerotic calcification  is noted in the wall of the thoracic aorta. Left Port-A-Cath tip is positioned in the mid SVC. Mediastinum/Nodes: No mediastinal lymphadenopathy. There is no hilar lymphadenopathy. The esophagus has normal imaging features. There is no axillary lymphadenopathy. Lungs/Pleura: No suspicious pulmonary nodule or mass in the right hemithorax. Circumferential irregular pleural thickening in the left chest with nodular thickening of the major fissure is similar to prior. Paraspinal nodule measured on the previous study at 2.1 x 1.7 cm is 2.8 x 1.9 cm today compared to 2.6 x 1.8 cm (remeasured) previously. Upper Abdomen: Unremarkable. Musculoskeletal: No worrisome lytic or sclerotic osseous abnormality. IMPRESSION: 1. Stable exam. No new or progressive findings. 2. Similar appearance of circumferential irregular pleural thickening in the left chest with nodular thickening of the major fissure, findings consistent with reported history of mesothelioma. 3. Aortic Atherosclerosis (ICD10-I70.0). Electronically Signed   By: Misty Stanley M.D.   On: 10/15/2020 12:20     ASSESSMENT AND PLAN: This is a very pleasant 80 years old white male recently diagnosed with malignant pleural mesothelioma involving the left hemithorax. The patient is currently undergoing systemic chemotherapy with carboplatin for AUC of 5, Alimta 500 mg/M2 and Avastin 15 mg/KG every 3 weeks.  Status post 15 cycles.  Starting from cycle #7 the patient will be on maintenance treatment with Avastin 15 mg/KG every 3 weeks. The patient has been tolerating this treatment well with no concerning adverse effects. He had repeat CT scan of the chest performed recently.  I personally and independently reviewed the scan and discussed the results with the patient today. Has a scan showed no concerning findings for disease progression. I recommended for him to continue his current treatment with the same regimen and he will proceed with cycle #16 today. I will see  him back for follow-up visit in 3 weeks for evaluation before starting cycle #17. For the anemia, the patient will continue on the oral iron tablets for now. He will come back for follow-up visit in 3 weeks for evaluation before the next cycle of his treatment. The patient was advised to call immediately if he has any other concerning symptoms in the interval.  The patient voices understanding of current disease status and treatment options and is in agreement with the current care plan.  All questions were answered. The patient knows to call the clinic with any problems, questions or concerns. We can certainly see the patient much sooner if necessary.   Disclaimer: This note was dictated with voice recognition software. Similar sounding words can inadvertently be transcribed and may not be corrected upon review.

## 2020-11-06 ENCOUNTER — Inpatient Hospital Stay (HOSPITAL_BASED_OUTPATIENT_CLINIC_OR_DEPARTMENT_OTHER): Payer: PPO | Admitting: Internal Medicine

## 2020-11-06 ENCOUNTER — Encounter: Payer: Self-pay | Admitting: Internal Medicine

## 2020-11-06 ENCOUNTER — Inpatient Hospital Stay: Payer: PPO

## 2020-11-06 ENCOUNTER — Other Ambulatory Visit: Payer: Self-pay

## 2020-11-06 VITALS — BP 138/85 | Temp 98.5°F

## 2020-11-06 VITALS — HR 78 | Temp 97.1°F | Resp 18 | Wt 205.2 lb

## 2020-11-06 DIAGNOSIS — Z5111 Encounter for antineoplastic chemotherapy: Secondary | ICD-10-CM

## 2020-11-06 DIAGNOSIS — C457 Mesothelioma of other sites: Secondary | ICD-10-CM

## 2020-11-06 DIAGNOSIS — Z95828 Presence of other vascular implants and grafts: Secondary | ICD-10-CM

## 2020-11-06 DIAGNOSIS — Z5112 Encounter for antineoplastic immunotherapy: Secondary | ICD-10-CM | POA: Diagnosis not present

## 2020-11-06 LAB — CBC WITH DIFFERENTIAL (CANCER CENTER ONLY)
Abs Immature Granulocytes: 0.05 10*3/uL (ref 0.00–0.07)
Basophils Absolute: 0 10*3/uL (ref 0.0–0.1)
Basophils Relative: 0 %
Eosinophils Absolute: 0 10*3/uL (ref 0.0–0.5)
Eosinophils Relative: 0 %
HCT: 37.9 % — ABNORMAL LOW (ref 39.0–52.0)
Hemoglobin: 12.6 g/dL — ABNORMAL LOW (ref 13.0–17.0)
Immature Granulocytes: 1 %
Lymphocytes Relative: 8 %
Lymphs Abs: 0.7 10*3/uL (ref 0.7–4.0)
MCH: 29.6 pg (ref 26.0–34.0)
MCHC: 33.2 g/dL (ref 30.0–36.0)
MCV: 89.2 fL (ref 80.0–100.0)
Monocytes Absolute: 0.4 10*3/uL (ref 0.1–1.0)
Monocytes Relative: 5 %
Neutro Abs: 7.4 10*3/uL (ref 1.7–7.7)
Neutrophils Relative %: 86 %
Platelet Count: 148 10*3/uL — ABNORMAL LOW (ref 150–400)
RBC: 4.25 MIL/uL (ref 4.22–5.81)
RDW: 13.3 % (ref 11.5–15.5)
WBC Count: 8.5 10*3/uL (ref 4.0–10.5)
nRBC: 0 % (ref 0.0–0.2)

## 2020-11-06 LAB — CMP (CANCER CENTER ONLY)
ALT: 13 U/L (ref 0–44)
AST: 12 U/L — ABNORMAL LOW (ref 15–41)
Albumin: 3.9 g/dL (ref 3.5–5.0)
Alkaline Phosphatase: 60 U/L (ref 38–126)
Anion gap: 13 (ref 5–15)
BUN: 20 mg/dL (ref 8–23)
CO2: 23 mmol/L (ref 22–32)
Calcium: 9.6 mg/dL (ref 8.9–10.3)
Chloride: 102 mmol/L (ref 98–111)
Creatinine: 1.35 mg/dL — ABNORMAL HIGH (ref 0.61–1.24)
GFR, Estimated: 53 mL/min — ABNORMAL LOW (ref >60)
Glucose, Bld: 299 mg/dL — ABNORMAL HIGH (ref 70–99)
Potassium: 4.3 mmol/L (ref 3.5–5.1)
Sodium: 138 mmol/L (ref 135–145)
Total Bilirubin: 0.5 mg/dL (ref 0.3–1.2)
Total Protein: 7 g/dL (ref 6.5–8.1)

## 2020-11-06 LAB — TOTAL PROTEIN, URINE DIPSTICK: Protein, ur: NEGATIVE mg/dL

## 2020-11-06 MED ORDER — SODIUM CHLORIDE 0.9 % IV SOLN
15.0000 mg/kg | Freq: Once | INTRAVENOUS | Status: AC
  Administered 2020-11-06: 1300 mg via INTRAVENOUS
  Filled 2020-11-06: qty 48

## 2020-11-06 MED ORDER — SODIUM CHLORIDE 0.9 % IV SOLN: Freq: Once | INTRAVENOUS | Status: AC

## 2020-11-06 MED ORDER — HEPARIN SOD (PORK) LOCK FLUSH 100 UNIT/ML IV SOLN
500.0000 [IU] | Freq: Once | INTRAVENOUS | Status: DC | PRN
Start: 2020-11-06 — End: 2020-11-06

## 2020-11-06 MED ORDER — SODIUM CHLORIDE 0.9% FLUSH
10.0000 mL | INTRAVENOUS | Status: DC | PRN
Start: 2020-11-06 — End: 2020-11-06

## 2020-11-06 MED ORDER — SODIUM CHLORIDE 0.9% FLUSH
10.0000 mL | Freq: Once | INTRAVENOUS | Status: AC
  Administered 2020-11-06: 10 mL

## 2020-11-06 NOTE — Progress Notes (Signed)
[facility] [phone]   [phone]  OFFICE PROGRESS NOTE  Jack Pollen, MD [place] [address]  DIAGNOSIS: Stage I/II malignant pleural mesothelioma involving the left hemithorax diagnosed in September 2021.  PRIOR THERAPY: None.  CURRENT THERAPY:  Systemic chemotherapy with carboplatin for AUC of 5, Alimta 500 mg/M2 and Avastin 15 mg/KG every 3 weeks.  Status post 16 cycles.  Starting from cycle #7 the patient will be on treatment with maintenance Avastin 15 mg/KG every 3 weeks.  INTERVAL HISTORY: Jack Haynes 80 y.o. male returns to the clinic today for follow-up visit accompanied by his girlfriend.  The patient is feeling fine today with no concerning complaints except for fatigue.  He denied having any current chest pain, shortness of breath, cough or hemoptysis.  He denied having any fever or chills.  He has no nausea, vomiting, diarrhea or constipation.  He has no headache or visual changes.  He continues to tolerate his treatment with maintenance Avastin fairly well.  The patient is here today for evaluation before starting cycle #17.  MEDICAL HISTORY: Past Medical History:  Diagnosis Date   Arthritis    Cancer ([place])    hx of prostatae cancer   Dyspnea    until chest tube placed   Hypertension    Mesothelioma [facility])     ALLERGIES:  has no allergies on file.  MEDICATIONS:  Current Outpatient Medications  Medication Sig Dispense Refill   acetaminophen (TYLENOL) 500 MG tablet Take 2 tablets (1,000 mg total) by mouth every 6 (six) hours as needed for mild pain or fever. (Patient taking differently: Take 500 mg by mouth every 6 (six) hours as needed for mild pain or fever.) 30 tablet 0   aspirin EC 81 MG tablet Take 81 mg by mouth daily. Swallow whole.     calcium carbonate (TUMS - DOSED IN MG ELEMENTAL CALCIUM) 500 MG chewable tablet Chew 1,000 mg by mouth daily as needed for indigestion or heartburn.      Cetirizine HCl (ZYRTEC ALLERGY) 10 MG CAPS Zyrtec 10 mg capsule  Take by oral route.     dexamethasone (DECADRON) 4 MG tablet TAKE 1 TABLET BY MOUTH TWICE DAILY THE DAY BEFORE, DAY OF AND DAY AFTER CHEMOTHERAPY EVERY 3 WEEKS 40 tablet 1   empagliflozin (JARDIANCE) 10 MG TABS tablet Take 1 tablet (10 mg total) by mouth daily before breakfast. 90 tablet 1   ferrous sulfate 325 (65 FE) MG tablet Take 325 mg by mouth daily with breakfast.     fluconazole (DIFLUCAN) 100 MG tablet TAKE 1 TABLET BY MOUTH EVERY DAY (Patient taking differently: Take 100 mg by mouth daily as needed (thrush).) 7 tablet 0   lidocaine-prilocaine (EMLA) cream Apply to Port-A-Cath site 30-60-minute before treatment. 30 g 0   losartan-hydrochlorothiazide (HYZAAR) 100-12.5 MG tablet Take 1 tablet by mouth daily. 90 tablet 3   metFORMIN (GLUCOPHAGE) 500 MG tablet Take 1 tablet (500 mg total) by mouth 2 (two) times daily with a meal. 180 tablet 3   ondansetron (ZOFRAN ODT) 4 MG disintegrating tablet Take 1 tablet (4 mg total) by mouth every 8 (eight) hours as needed for nausea or vomiting. Starting 3 days after chemotherapy 30 tablet 1   prochlorperazine (COMPAZINE) 10 MG tablet TAKE 1 TABLET BY MOUTH EVERY 6 HOURS AS NEEDED FOR NAUSEA OR VOMITING. 30 tablet 0   rosuvastatin (CRESTOR) 20 MG tablet TAKE 1 TABLET BY MOUTH EVERY DAY 90  tablet 3   simethicone (MYLICON) 063 MG chewable tablet Chew 125 mg by mouth daily as needed (gas).     No current facility-administered medications for this visit.    SURGICAL HISTORY:  Past Surgical History:  Procedure Laterality Date   APPENDECTOMY     BACK SURGERY  1998   disectomy   CHEST TUBE INSERTION Left 11/13/2019   Procedure: INSERTION PLEURAL DRAINAGE CATHETER;  Surgeon: Grace Isaac, MD;  Location: [facility];  Service: Thoracic;  Laterality: Left;   EYE SURGERY Left 1965   injury   IR [place]  05/27/2020   JOINT REPLACEMENT Left 2010   left knee  replacement   KNEE SURGERY     PLEURAL BIOPSY Left 11/13/2019   Procedure: PLEURAL BIOPSY;  Surgeon: Grace Isaac, MD;  Location: [facility];  Service: Thoracic;  Laterality: Left;   PORTACATH PLACEMENT Left 11/26/2019   Procedure: INSERTION PORT-A-CATH BARD POWERPORT 9.6 FR CATHETER;  Surgeon: Grace Isaac, MD;  Location: [facility];  Service: Thoracic;  Laterality: Left;   PROSTATE SURGERY     REMOVAL OF PLEURAL DRAINAGE CATHETER Left 11/26/2019   Procedure: REMOVAL OF PLEURAL DRAINAGE CATHETER;  Surgeon: Grace Isaac, MD;  Location: [facility];  Service: Thoracic;  Laterality: Left;   SPINE SURGERY     TALC PLEURODESIS Left 11/13/2019   Procedure: possible TALC PLEURADESIS;  Surgeon: Grace Isaac, MD;  Location: [facility];  Service: Thoracic;  Laterality: Left;   TOTAL KNEE ARTHROPLASTY  02/01/2012   Procedure: TOTAL KNEE ARTHROPLASTY;  Surgeon: Sydnee Cabal, MD;  Location: [facility];  Service: Orthopedics;  Laterality: Left;   VIDEO ASSISTED THORACOSCOPY Left 11/13/2019   Procedure: VIDEO ASSISTED THORACOSCOPY;  Surgeon: Grace Isaac, MD;  Location: [facility];  Service: Thoracic;  Laterality: Left;   VIDEO BRONCHOSCOPY N/A 11/13/2019   Procedure: VIDEO BRONCHOSCOPY;  Surgeon: Grace Isaac, MD;  Location: [facility];  Service: Thoracic;  Laterality: N/A;    REVIEW OF SYSTEMS:  A comprehensive review of systems was negative except for: Constitutional: positive for fatigue   PHYSICAL EXAMINATION: General appearance: alert, cooperative, fatigued, and no distress Head: Normocephalic, without obvious abnormality, atraumatic Neck: no adenopathy, no JVD, supple, symmetrical, trachea midline, and thyroid not enlarged, symmetric, no tenderness/mass/nodules Lymph nodes: Cervical, supraclavicular, and axillary nodes normal. Resp: clear to auscultation bilaterally Back: symmetric, no curvature. ROM normal. No CVA tenderness. Cardio: regular rate and rhythm, S1, S2 normal, no murmur, click, rub  or gallop GI: soft, non-tender; bowel sounds normal; no masses,  no organomegaly Extremities: extremities normal, atraumatic, no cyanosis or edema  ECOG PERFORMANCE STATUS: 1 - Symptomatic but completely ambulatory  Pulse 78, temperature (!) 97.1 F (36.2 C), temperature source Oral, resp. rate 18, weight 205 lb 3 oz (93.1 kg), SpO2 95 %.  LABORATORY DATA: Lab Results  Component Value Date   WBC 8.5 11/06/2020   HGB 12.6 (L) 11/06/2020   HCT 37.9 (L) 11/06/2020   MCV 89.2 11/06/2020   PLT 148 (L) 11/06/2020      Chemistry      Component Value Date/Time   NA 136 10/16/2020 0752   NA 141 09/24/2019 0832   K 4.1 10/16/2020 0752   CL 101 10/16/2020 0752   CO2 23 10/16/2020 0752   BUN 19 10/16/2020 0752   BUN 17 09/24/2019 0832   CREATININE 1.26 (H) 10/16/2020 0752   CREATININE 1.01 12/24/2014 0916      Component Value Date/Time  CALCIUM 9.1 10/16/2020 0752   ALKPHOS 55 10/16/2020 0752   AST 11 (L) 10/16/2020 0752   ALT 16 10/16/2020 0752   BILITOT 0.6 10/16/2020 0752       RADIOGRAPHIC STUDIES: CT Chest W Contrast  Result Date: 10/15/2020 CLINICAL DATA:  History of mesothelioma of the left chest. Restaging. EXAM: CT CHEST WITH CONTRAST TECHNIQUE: Multidetector CT imaging of the chest was performed during intravenous contrast administration. CONTRAST:  25mL OMNIPAQUE IOHEXOL 350 MG/ML SOLN COMPARISON:  08/12/2020 FINDINGS: Cardiovascular: The heart size is normal. No substantial pericardial effusion. Coronary artery calcification is evident. Mild atherosclerotic calcification is noted in the wall of the thoracic aorta. Left Port-A-Cath tip is positioned in the mid SVC. Mediastinum/Nodes: No mediastinal lymphadenopathy. There is no hilar lymphadenopathy. The esophagus has normal imaging features. There is no axillary lymphadenopathy. Lungs/Pleura: No suspicious pulmonary nodule or mass in the right hemithorax. Circumferential irregular pleural thickening in the left chest with  nodular thickening of the major fissure is similar to prior. Paraspinal nodule measured on the previous study at 2.1 x 1.7 cm is 2.8 x 1.9 cm today compared to 2.6 x 1.8 cm (remeasured) previously. Upper Abdomen: Unremarkable. Musculoskeletal: No worrisome lytic or sclerotic osseous abnormality. IMPRESSION: 1. Stable exam. No new or progressive findings. 2. Similar appearance of circumferential irregular pleural thickening in the left chest with nodular thickening of the major fissure, findings consistent with reported history of mesothelioma. 3. Aortic Atherosclerosis (ICD10-I70.0). Electronically Signed   By: Misty Stanley M.D.   On: 10/15/2020 12:20     ASSESSMENT AND PLAN: This is a very pleasant 80 years old white male recently diagnosed with malignant pleural mesothelioma involving the left hemithorax. The patient is currently undergoing systemic chemotherapy with carboplatin for AUC of 5, Alimta 500 mg/M2 and Avastin 15 mg/KG every 3 weeks.  Status post 16 cycles.  Starting from cycle #7 the patient will be on maintenance treatment with Avastin 15 mg/KG every 3 weeks. The patient continues to tolerate his maintenance treatment with Avastin fairly well. I recommended for him to proceed with cycle #17 today as planned. I will see him back for follow-up visit in 3 weeks for evaluation before the next cycle of his treatment. For the anemia, the patient will continue on the oral iron tablets for now. The patient was advised to call immediately if he has any other concerning symptoms in the interval. The patient voices understanding of current disease status and treatment options and is in agreement with the current care plan.  All questions were answered. The patient knows to call the clinic with any problems, questions or concerns. We can certainly see the patient much sooner if necessary.   Disclaimer: This note was dictated with voice recognition software. Similar sounding words can inadvertently  be transcribed and may not be corrected upon review.

## 2020-11-06 NOTE — Patient Instructions (Signed)
[facility]  Discharge Instructions: °Thank you for choosing [facility] to provide your oncology and hematology care.  ° °If you have a lab appointment with the Cancer Center, please go directly to the Cancer Center and check in at the registration area. °  °Wear comfortable clothing and clothing appropriate for easy access to any Portacath or PICC line.  ° °We strive to give you quality time with your provider. You may need to reschedule your appointment if you arrive late (15 or more minutes).  Arriving late affects you and other patients whose appointments are after yours.  Also, if you miss three or more appointments without notifying the office, you may be dismissed from the clinic at the provider’s discretion.    °  °For prescription refill requests, have your pharmacy contact our office and allow 72 hours for refills to be completed.   ° °Today you received the following chemotherapy and/or immunotherapy agents: bevacizumab    °  °To help prevent nausea and vomiting after your treatment, we encourage you to take your nausea medication as directed. ° °BELOW ARE SYMPTOMS THAT SHOULD BE REPORTED IMMEDIATELY: °*FEVER GREATER THAN 100.4 F (38 °C) OR HIGHER °*CHILLS OR SWEATING °*NAUSEA AND VOMITING THAT IS NOT CONTROLLED WITH YOUR NAUSEA MEDICATION °*UNUSUAL SHORTNESS OF BREATH °*UNUSUAL BRUISING OR BLEEDING °*URINARY PROBLEMS (pain or burning when urinating, or frequent urination) °*BOWEL PROBLEMS (unusual diarrhea, constipation, pain near the anus) °TENDERNESS IN MOUTH AND THROAT WITH OR WITHOUT PRESENCE OF ULCERS (sore throat, sores in mouth, or a toothache) °UNUSUAL RASH, SWELLING OR PAIN  °UNUSUAL VAGINAL DISCHARGE OR ITCHING  ° °Items with * indicate a potential emergency and should be followed up as soon as possible or go to the Emergency Department if any problems should occur. ° °Please show the CHEMOTHERAPY ALERT CARD or IMMUNOTHERAPY ALERT CARD at check-in  to the Emergency Department and triage nurse. ° °Should you have questions after your visit or need to cancel or reschedule your appointment, please contact [facility]  Dept: [phone]  and follow the prompts.  Office hours are 8:00 a.m. to 4:30 p.m. Monday - Friday. Please note that voicemails left after 4:00 p.m. may not be returned until the following business day.  We are closed weekends and major holidays. You have access to a nurse at all times for urgent questions. Please call the main number to the clinic Dept: [phone] and follow the prompts. ° ° °For any non-urgent questions, you may also contact your provider using MyChart. We now offer e-Visits for anyone 18 and older to request care online for non-urgent symptoms. For details visit [email or web address]. °  °Also download the MyChart app! Go to the app store, search "MyChart", open the app, select [facility], and log in with your MyChart username and password. ° °Due to Covid, a mask is required upon entering the hospital/clinic. If you do not have a mask, one will be given to you upon arrival. For doctor visits, patients may have 1 support person aged 18 or older with them. For treatment visits, patients cannot have anyone with them due to current Covid guidelines and our immunocompromised population.  ° °

## 2020-11-25 ENCOUNTER — Other Ambulatory Visit: Payer: Self-pay | Admitting: Internal Medicine

## 2020-11-25 DIAGNOSIS — C457 Mesothelioma of other sites: Secondary | ICD-10-CM

## 2020-11-27 ENCOUNTER — Other Ambulatory Visit: Payer: Self-pay

## 2020-11-27 ENCOUNTER — Inpatient Hospital Stay: Payer: PPO

## 2020-11-27 ENCOUNTER — Encounter: Payer: Self-pay | Admitting: Internal Medicine

## 2020-11-27 ENCOUNTER — Inpatient Hospital Stay: Payer: PPO | Admitting: Internal Medicine

## 2020-11-27 ENCOUNTER — Inpatient Hospital Stay: Payer: PPO | Attending: Internal Medicine

## 2020-11-27 VITALS — BP 135/87 | HR 95 | Temp 97.8°F | Resp 19 | Ht 67.0 in | Wt 204.0 lb

## 2020-11-27 DIAGNOSIS — Z5111 Encounter for antineoplastic chemotherapy: Secondary | ICD-10-CM

## 2020-11-27 DIAGNOSIS — C450 Mesothelioma of pleura: Secondary | ICD-10-CM | POA: Insufficient documentation

## 2020-11-27 DIAGNOSIS — Z7982 Long term (current) use of aspirin: Secondary | ICD-10-CM | POA: Insufficient documentation

## 2020-11-27 DIAGNOSIS — Z95828 Presence of other vascular implants and grafts: Secondary | ICD-10-CM

## 2020-11-27 DIAGNOSIS — I10 Essential (primary) hypertension: Secondary | ICD-10-CM | POA: Diagnosis not present

## 2020-11-27 DIAGNOSIS — Z77090 Contact with and (suspected) exposure to asbestos: Secondary | ICD-10-CM | POA: Diagnosis not present

## 2020-11-27 DIAGNOSIS — G629 Polyneuropathy, unspecified: Secondary | ICD-10-CM | POA: Diagnosis not present

## 2020-11-27 DIAGNOSIS — C457 Mesothelioma of other sites: Secondary | ICD-10-CM

## 2020-11-27 DIAGNOSIS — Z79899 Other long term (current) drug therapy: Secondary | ICD-10-CM | POA: Insufficient documentation

## 2020-11-27 DIAGNOSIS — Z7984 Long term (current) use of oral hypoglycemic drugs: Secondary | ICD-10-CM | POA: Diagnosis not present

## 2020-11-27 DIAGNOSIS — M129 Arthropathy, unspecified: Secondary | ICD-10-CM | POA: Diagnosis not present

## 2020-11-27 LAB — CBC WITH DIFFERENTIAL (CANCER CENTER ONLY)
Abs Immature Granulocytes: 0.04 10*3/uL (ref 0.00–0.07)
Basophils Absolute: 0 10*3/uL (ref 0.0–0.1)
Basophils Relative: 0 %
Eosinophils Absolute: 0 10*3/uL (ref 0.0–0.5)
Eosinophils Relative: 0 %
HCT: 36.3 % — ABNORMAL LOW (ref 39.0–52.0)
Hemoglobin: 12.3 g/dL — ABNORMAL LOW (ref 13.0–17.0)
Immature Granulocytes: 1 %
Lymphocytes Relative: 10 %
Lymphs Abs: 0.7 10*3/uL (ref 0.7–4.0)
MCH: 30 pg (ref 26.0–34.0)
MCHC: 33.9 g/dL (ref 30.0–36.0)
MCV: 88.5 fL (ref 80.0–100.0)
Monocytes Absolute: 0.4 10*3/uL (ref 0.1–1.0)
Monocytes Relative: 5 %
Neutro Abs: 5.8 10*3/uL (ref 1.7–7.7)
Neutrophils Relative %: 84 %
Platelet Count: 163 10*3/uL (ref 150–400)
RBC: 4.1 MIL/uL — ABNORMAL LOW (ref 4.22–5.81)
RDW: 13 % (ref 11.5–15.5)
WBC Count: 6.8 10*3/uL (ref 4.0–10.5)
nRBC: 0 % (ref 0.0–0.2)

## 2020-11-27 LAB — CMP (CANCER CENTER ONLY)
ALT: 16 U/L (ref 0–44)
AST: 19 U/L (ref 15–41)
Albumin: 4 g/dL (ref 3.5–5.0)
Alkaline Phosphatase: 50 U/L (ref 38–126)
Anion gap: 10 (ref 5–15)
BUN: 23 mg/dL (ref 8–23)
CO2: 26 mmol/L (ref 22–32)
Calcium: 9 mg/dL (ref 8.9–10.3)
Chloride: 100 mmol/L (ref 98–111)
Creatinine: 1.22 mg/dL (ref 0.61–1.24)
GFR, Estimated: >60 mL/min (ref >60)
Glucose, Bld: 210 mg/dL — ABNORMAL HIGH (ref 70–99)
Potassium: 4.1 mmol/L (ref 3.5–5.1)
Sodium: 136 mmol/L (ref 135–145)
Total Bilirubin: 0.9 mg/dL (ref 0.3–1.2)
Total Protein: 6.8 g/dL (ref 6.5–8.1)

## 2020-11-27 LAB — TOTAL PROTEIN, URINE DIPSTICK: Protein, ur: <30 mg/dL

## 2020-11-27 MED ORDER — SODIUM CHLORIDE 0.9 % IV SOLN: INTRAVENOUS | Status: DC

## 2020-11-27 MED ORDER — SODIUM CHLORIDE 0.9 % IV SOLN
15.0000 mg/kg | Freq: Once | INTRAVENOUS | Status: AC
  Administered 2020-11-27: 1300 mg via INTRAVENOUS
  Filled 2020-11-27: qty 48

## 2020-11-27 MED ORDER — SODIUM CHLORIDE 0.9% FLUSH
10.0000 mL | INTRAVENOUS | Status: DC | PRN
  Administered 2020-11-27: 10 mL

## 2020-11-27 MED ORDER — HEPARIN SOD (PORK) LOCK FLUSH 100 UNIT/ML IV SOLN
500.0000 [IU] | Freq: Once | INTRAVENOUS | Status: AC | PRN
  Administered 2020-11-27: 500 [IU]

## 2020-11-27 MED ORDER — SODIUM CHLORIDE 0.9% FLUSH: 10.0000 mL | Freq: Once | INTRAVENOUS | Status: DC

## 2020-11-27 NOTE — Patient Instructions (Signed)
[facility]  Discharge Instructions: Thank you for choosing [facility] to provide your oncology and hematology care.   If you have a lab appointment with the [facility], please go directly to the [facility] and check in at the registration area.   Wear comfortable clothing and clothing appropriate for easy access to any Portacath or PICC line.   We strive to give you quality time with your provider. You may need to reschedule your appointment if you arrive late (15 or more minutes).  Arriving late affects you and other patients whose appointments are after yours.  Also, if you miss three or more appointments without notifying the office, you may be dismissed from the clinic at the provider's discretion.      For prescription refill requests, have your pharmacy contact our office and allow 72 hours for refills to be completed.    Today you received the following chemotherapy and/or immunotherapy agents [id number]      To help prevent nausea and vomiting after your treatment, we encourage you to take your nausea medication as directed.  BELOW ARE SYMPTOMS THAT SHOULD BE REPORTED IMMEDIATELY: *FEVER GREATER THAN 100.4 F (38 C) OR HIGHER *CHILLS OR SWEATING *NAUSEA AND VOMITING THAT IS NOT CONTROLLED WITH YOUR NAUSEA MEDICATION *UNUSUAL SHORTNESS OF BREATH *UNUSUAL BRUISING OR BLEEDING *URINARY PROBLEMS (pain or burning when urinating, or frequent urination) *BOWEL PROBLEMS (unusual diarrhea, constipation, pain near the anus) TENDERNESS IN MOUTH AND THROAT WITH OR WITHOUT PRESENCE OF ULCERS (sore throat, sores in mouth, or a toothache) UNUSUAL RASH, SWELLING OR PAIN  UNUSUAL VAGINAL DISCHARGE OR ITCHING   Items with * indicate a potential emergency and should be followed up as soon as possible or go to the Emergency Department if any problems should occur.  Please show the CHEMOTHERAPY ALERT CARD or IMMUNOTHERAPY ALERT CARD at  check-in to the Emergency Department and triage nurse.  Should you have questions after your visit or need to cancel or reschedule your appointment, please contact [facility]  Dept: [phone]  and follow the prompts.  Office hours are 8:00 a.m. to 4:30 p.m. Monday - Friday. Please note that voicemails left after 4:00 p.m. may not be returned until the following business day.  We are closed weekends and major holidays. You have access to a nurse at all times for urgent questions. Please call the main number to the clinic Dept: [phone] and follow the prompts.   For any non-urgent questions, you may also contact your provider using MyChart. We now offer e-Visits for anyone 17 and older to request care online for non-urgent symptoms. For details visit [email or web address].   Also download the MyChart app! Go to the app store, search "MyChart", open the app, select [facility], and log in with your MyChart username and password.  Due to Covid, a mask is required upon entering the hospital/clinic. If you do not have a mask, one will be given to you upon arrival. For doctor visits, patients may have 1 support person aged 58 or older with them. For treatment visits, patients cannot have anyone with them due to current Covid guidelines and our immunocompromised population.

## 2020-11-27 NOTE — Progress Notes (Signed)
[facility] Telephone:([phone]) [phone]   Fax:([phone]  OFFICE PROGRESS NOTE  Jack Pollen, MD [place] [address]  DIAGNOSIS: Stage I/II malignant pleural mesothelioma involving the left hemithorax diagnosed in September 2021.  PRIOR THERAPY: None.  CURRENT THERAPY:  Systemic chemotherapy with carboplatin for AUC of 5, Alimta 500 mg/M2 and Avastin 15 mg/KG every 3 weeks.  Status post 17 cycles.  Starting from cycle #7 the patient will be on treatment with maintenance Avastin 15 mg/KG every 3 weeks.  INTERVAL HISTORY: Jack Haynes 80 y.o. male returns to the clinic today for follow-up visit accompanied by his girlfriend.  The patient is feeling fine today with no concerning complaints except for mild peripheral neuropathy.  He denied having any current chest pain, shortness of breath, cough or hemoptysis.  He denied having any fever or chills.  He has no nausea, vomiting, diarrhea or constipation.  He has no headache or visual changes.  He continues to tolerate his treatment with Avastin fairly well.  He is here for evaluation before starting cycle #18.  MEDICAL HISTORY: Past Medical History:  Diagnosis Date   Arthritis    Cancer ()    hx of prostatae cancer   Dyspnea    until chest tube placed   Hypertension    Mesothelioma [facility])     ALLERGIES:  has no allergies on file.  MEDICATIONS:  Current Outpatient Medications  Medication Sig Dispense Refill   acetaminophen (TYLENOL) 500 MG tablet Take 2 tablets (1,000 mg total) by mouth every 6 (six) hours as needed for mild pain or fever. (Patient taking differently: Take 500 mg by mouth every 6 (six) hours as needed for mild pain or fever.) 30 tablet 0   aspirin EC 81 MG tablet Take 81 mg by mouth daily. Swallow whole.     calcium carbonate (TUMS - DOSED IN MG ELEMENTAL CALCIUM) 500 MG chewable tablet Chew 1,000 mg by mouth daily as needed for indigestion or heartburn.     Cetirizine  HCl (ZYRTEC ALLERGY) 10 MG CAPS Zyrtec 10 mg capsule  Take by oral route.     dexamethasone (DECADRON) 4 MG tablet TAKE 1 TABLET BY MOUTH TWICE DAILY THE DAY BEFORE, DAY OF AND DAY AFTER CHEMOTHERAPY EVERY 3 WEEKS 40 tablet 1   empagliflozin (JARDIANCE) 10 MG TABS tablet Take 1 tablet (10 mg total) by mouth daily before breakfast. 90 tablet 1   ferrous sulfate 325 (65 FE) MG tablet Take 325 mg by mouth daily with breakfast.     fluconazole (DIFLUCAN) 100 MG tablet TAKE 1 TABLET BY MOUTH EVERY DAY (Patient taking differently: Take 100 mg by mouth daily as needed (thrush).) 7 tablet 0   folic acid (FOLVITE) 1 MG tablet Take 1 mg by mouth daily.     lidocaine-prilocaine (EMLA) cream Apply to Port-A-Cath site 30-60-minute before treatment. 30 g 0   losartan-hydrochlorothiazide (HYZAAR) 100-12.5 MG tablet Take 1 tablet by mouth daily. 90 tablet 3   metFORMIN (GLUCOPHAGE) 500 MG tablet Take 1 tablet (500 mg total) by mouth 2 (two) times daily with a meal. 180 tablet 3   ondansetron (ZOFRAN ODT) 4 MG disintegrating tablet Take 1 tablet (4 mg total) by mouth every 8 (eight) hours as needed for nausea or vomiting. Starting 3 days after chemotherapy 30 tablet 1   prochlorperazine (COMPAZINE) 10 MG tablet TAKE 1 TABLET BY MOUTH EVERY 6 HOURS AS NEEDED FOR NAUSEA OR VOMITING. 30 tablet 0  rosuvastatin (CRESTOR) 20 MG tablet TAKE 1 TABLET BY MOUTH EVERY DAY 90 tablet 3   simethicone (MYLICON) 962 MG chewable tablet Chew 125 mg by mouth daily as needed (gas).     No current facility-administered medications for this visit.    SURGICAL HISTORY:  Past Surgical History:  Procedure Laterality Date   APPENDECTOMY     BACK SURGERY  1998   disectomy   CHEST TUBE INSERTION Left 11/13/2019   Procedure: INSERTION PLEURAL DRAINAGE CATHETER;  Surgeon: Grace Isaac, MD;  Location: [facility];  Service: Thoracic;  Laterality: Left;   EYE SURGERY Left 1965   injury   IR [place]   05/27/2020   JOINT REPLACEMENT Left 2010   left knee replacement   KNEE SURGERY     PLEURAL BIOPSY Left 11/13/2019   Procedure: PLEURAL BIOPSY;  Surgeon: Grace Isaac, MD;  Location: [facility];  Service: Thoracic;  Laterality: Left;   PORTACATH PLACEMENT Left 11/26/2019   Procedure: INSERTION PORT-A-CATH BARD POWERPORT 9.6 FR CATHETER;  Surgeon: Grace Isaac, MD;  Location: [place];  Service: Thoracic;  Laterality: Left;   PROSTATE SURGERY     REMOVAL OF PLEURAL DRAINAGE CATHETER Left 11/26/2019   Procedure: REMOVAL OF PLEURAL DRAINAGE CATHETER;  Surgeon: Grace Isaac, MD;  Location: [place];  Service: Thoracic;  Laterality: Left;   SPINE SURGERY     TALC PLEURODESIS Left 11/13/2019   Procedure: possible TALC PLEURADESIS;  Surgeon: Grace Isaac, MD;  Location: [place];  Service: Thoracic;  Laterality: Left;   TOTAL KNEE ARTHROPLASTY  02/01/2012   Procedure: TOTAL KNEE ARTHROPLASTY;  Surgeon: Sydnee Cabal, MD;  [facility];  Service: Orthopedics;  Laterality: Left;   VIDEO ASSISTED THORACOSCOPY Left 11/13/2019   Procedure: VIDEO ASSISTED THORACOSCOPY;  Surgeon: Grace Isaac, MD;  Location: [facility];  Service: Thoracic;  Laterality: Left;   VIDEO BRONCHOSCOPY N/A 11/13/2019   Procedure: VIDEO BRONCHOSCOPY;  Surgeon: Grace Isaac, MD;  [facility];  Service: Thoracic;  Laterality: N/A;    REVIEW OF SYSTEMS:  A comprehensive review of systems was negative except for: Constitutional: positive for fatigue Neurological: positive for paresthesia   PHYSICAL EXAMINATION: General appearance: alert, cooperative, fatigued, and no distress Head: Normocephalic, without obvious abnormality, atraumatic Neck: no adenopathy, no JVD, supple, symmetrical, trachea midline, and thyroid not enlarged, symmetric, no tenderness/mass/nodules Lymph nodes: Cervical, supraclavicular, and axillary nodes normal. Resp: clear to auscultation bilaterally Back: symmetric, no curvature. ROM  normal. No CVA tenderness. Cardio: regular rate and rhythm, S1, S2 normal, no murmur, click, rub or gallop GI: soft, non-tender; bowel sounds normal; no masses,  no organomegaly Extremities: extremities normal, atraumatic, no cyanosis or edema  ECOG PERFORMANCE STATUS: 1 - Symptomatic but completely ambulatory  Blood pressure 135/87, pulse 95, temperature 97.8 F (36.6 C), temperature source Tympanic, resp. rate 19, height 5\' 7"  (1.702 m), weight 204 lb (92.5 kg), SpO2 96 %.  LABORATORY DATA: Lab Results  Component Value Date   WBC 6.8 11/27/2020   HGB 12.3 (L) 11/27/2020   HCT 36.3 (L) 11/27/2020   MCV 88.5 11/27/2020   PLT 163 11/27/2020      Chemistry      Component Value Date/Time   NA 138 11/06/2020 0816   NA 141 09/24/2019 0832   K 4.3 11/06/2020 0816   CL 102 11/06/2020 0816   CO2 23 11/06/2020 0816   BUN 20 11/06/2020 0816   BUN 17 09/24/2019 8366  CREATININE 1.35 (H) 11/06/2020 0816   CREATININE 1.01 12/24/2014 0916      Component Value Date/Time   CALCIUM 9.6 11/06/2020 0816   ALKPHOS 60 11/06/2020 0816   AST 12 (L) 11/06/2020 0816   ALT 13 11/06/2020 0816   BILITOT 0.5 11/06/2020 0816       RADIOGRAPHIC STUDIES: No results found.   ASSESSMENT AND PLAN: This is a very pleasant 80 years old white male recently diagnosed with malignant pleural mesothelioma involving the left hemithorax. The patient is currently undergoing systemic chemotherapy with carboplatin for AUC of 5, Alimta 500 mg/M2 and Avastin 15 mg/KG every 3 weeks.  Status post 17 cycles.  Starting from cycle #7 the patient will be on maintenance treatment with Avastin 15 mg/KG every 3 weeks. The patient has been tolerating this treatment well with no concerning adverse effects. I recommended for him to proceed with cycle #18 today as planned. I will see him back for follow-up visit in 3 weeks for evaluation with repeat CT scan of the chest for restaging of his disease. The patient was advised  to call immediately if he has any other concerning symptoms in the interval. The patient voices understanding of current disease status and treatment options and is in agreement with the current care plan.  All questions were answered. The patient knows to call the clinic with any problems, questions or concerns. We can certainly see the patient much sooner if necessary.   Disclaimer: This note was dictated with voice recognition software. Similar sounding words can inadvertently be transcribed and may not be corrected upon review.

## 2020-12-16 ENCOUNTER — Ambulatory Visit (HOSPITAL_COMMUNITY)
Admission: RE | Admit: 2020-12-16 | Discharge: 2020-12-16 | Disposition: A | Discharge status: Home / Self Care | Payer: PPO | Source: Ambulatory Visit | Attending: Internal Medicine | Admitting: Internal Medicine

## 2020-12-16 DIAGNOSIS — I2510 Atherosclerotic heart disease of native coronary artery without angina pectoris: Secondary | ICD-10-CM | POA: Diagnosis not present

## 2020-12-16 DIAGNOSIS — I700 Atherosclerosis of aorta: Secondary | ICD-10-CM | POA: Insufficient documentation

## 2020-12-16 DIAGNOSIS — Z77090 Contact with and (suspected) exposure to asbestos: Secondary | ICD-10-CM | POA: Insufficient documentation

## 2020-12-16 DIAGNOSIS — J9811 Atelectasis: Secondary | ICD-10-CM | POA: Diagnosis not present

## 2020-12-16 DIAGNOSIS — J90 Pleural effusion, not elsewhere classified: Secondary | ICD-10-CM | POA: Diagnosis not present

## 2020-12-16 MED ORDER — IOHEXOL 350 MG/ML SOLN
60.0000 mL | Freq: Once | INTRAVENOUS | Status: AC | PRN
  Administered 2020-12-16: 60 mL via INTRAVENOUS

## 2020-12-18 ENCOUNTER — Inpatient Hospital Stay: Payer: PPO

## 2020-12-18 ENCOUNTER — Inpatient Hospital Stay: Payer: PPO | Admitting: Internal Medicine

## 2020-12-18 ENCOUNTER — Encounter: Payer: Self-pay | Admitting: Internal Medicine

## 2020-12-18 ENCOUNTER — Other Ambulatory Visit: Payer: Self-pay

## 2020-12-18 ENCOUNTER — Telehealth: Payer: Self-pay | Admitting: Medical Oncology

## 2020-12-18 ENCOUNTER — Inpatient Hospital Stay: Payer: PPO | Attending: Internal Medicine

## 2020-12-18 VITALS — BP 128/89 | HR 83 | Temp 97.6°F | Resp 19 | Ht 67.0 in | Wt 203.8 lb

## 2020-12-18 DIAGNOSIS — K5900 Constipation, unspecified: Secondary | ICD-10-CM | POA: Insufficient documentation

## 2020-12-18 DIAGNOSIS — C457 Mesothelioma of other sites: Secondary | ICD-10-CM

## 2020-12-18 DIAGNOSIS — I10 Essential (primary) hypertension: Secondary | ICD-10-CM | POA: Diagnosis not present

## 2020-12-18 DIAGNOSIS — L989 Disorder of the skin and subcutaneous tissue, unspecified: Secondary | ICD-10-CM | POA: Insufficient documentation

## 2020-12-18 DIAGNOSIS — R0981 Nasal congestion: Secondary | ICD-10-CM | POA: Insufficient documentation

## 2020-12-18 DIAGNOSIS — R0609 Other forms of dyspnea: Secondary | ICD-10-CM | POA: Insufficient documentation

## 2020-12-18 DIAGNOSIS — Z5111 Encounter for antineoplastic chemotherapy: Secondary | ICD-10-CM | POA: Insufficient documentation

## 2020-12-18 DIAGNOSIS — R5383 Other fatigue: Secondary | ICD-10-CM | POA: Diagnosis not present

## 2020-12-18 DIAGNOSIS — Z79899 Other long term (current) drug therapy: Secondary | ICD-10-CM | POA: Diagnosis not present

## 2020-12-18 DIAGNOSIS — I700 Atherosclerosis of aorta: Secondary | ICD-10-CM | POA: Insufficient documentation

## 2020-12-18 DIAGNOSIS — I2510 Atherosclerotic heart disease of native coronary artery without angina pectoris: Secondary | ICD-10-CM | POA: Insufficient documentation

## 2020-12-18 DIAGNOSIS — R0602 Shortness of breath: Secondary | ICD-10-CM | POA: Insufficient documentation

## 2020-12-18 DIAGNOSIS — M129 Arthropathy, unspecified: Secondary | ICD-10-CM | POA: Diagnosis not present

## 2020-12-18 DIAGNOSIS — Z7982 Long term (current) use of aspirin: Secondary | ICD-10-CM | POA: Insufficient documentation

## 2020-12-18 DIAGNOSIS — C450 Mesothelioma of pleura: Secondary | ICD-10-CM | POA: Insufficient documentation

## 2020-12-18 DIAGNOSIS — Z95828 Presence of other vascular implants and grafts: Secondary | ICD-10-CM

## 2020-12-18 DIAGNOSIS — Z7984 Long term (current) use of oral hypoglycemic drugs: Secondary | ICD-10-CM | POA: Insufficient documentation

## 2020-12-18 LAB — CBC WITH DIFFERENTIAL (CANCER CENTER ONLY)
Abs Immature Granulocytes: 0.05 10*3/uL (ref 0.00–0.07)
Basophils Absolute: 0 10*3/uL (ref 0.0–0.1)
Basophils Relative: 0 %
Eosinophils Absolute: 0 10*3/uL (ref 0.0–0.5)
Eosinophils Relative: 0 %
HCT: 37.2 % — ABNORMAL LOW (ref 39.0–52.0)
Hemoglobin: 12.7 g/dL — ABNORMAL LOW (ref 13.0–17.0)
Immature Granulocytes: 1 %
Lymphocytes Relative: 9 %
Lymphs Abs: 0.8 10*3/uL (ref 0.7–4.0)
MCH: 30.4 pg (ref 26.0–34.0)
MCHC: 34.1 g/dL (ref 30.0–36.0)
MCV: 89 fL (ref 80.0–100.0)
Monocytes Absolute: 0.6 10*3/uL (ref 0.1–1.0)
Monocytes Relative: 7 %
Neutro Abs: 7.8 10*3/uL — ABNORMAL HIGH (ref 1.7–7.7)
Neutrophils Relative %: 83 %
Platelet Count: 162 10*3/uL (ref 150–400)
RBC: 4.18 MIL/uL — ABNORMAL LOW (ref 4.22–5.81)
RDW: 13.5 % (ref 11.5–15.5)
WBC Count: 9.3 10*3/uL (ref 4.0–10.5)
nRBC: 0 % (ref 0.0–0.2)

## 2020-12-18 LAB — CMP (CANCER CENTER ONLY)
ALT: 13 U/L (ref 0–44)
AST: 14 U/L — ABNORMAL LOW (ref 15–41)
Albumin: 3.9 g/dL (ref 3.5–5.0)
Alkaline Phosphatase: 58 U/L (ref 38–126)
Anion gap: 12 (ref 5–15)
BUN: 21 mg/dL (ref 8–23)
CO2: 24 mmol/L (ref 22–32)
Calcium: 9.7 mg/dL (ref 8.9–10.3)
Chloride: 103 mmol/L (ref 98–111)
Creatinine: 1.16 mg/dL (ref 0.61–1.24)
GFR, Estimated: >60 mL/min (ref >60)
Glucose, Bld: 173 mg/dL — ABNORMAL HIGH (ref 70–99)
Potassium: 4.2 mmol/L (ref 3.5–5.1)
Sodium: 139 mmol/L (ref 135–145)
Total Bilirubin: 0.5 mg/dL (ref 0.3–1.2)
Total Protein: 7.1 g/dL (ref 6.5–8.1)

## 2020-12-18 LAB — TOTAL PROTEIN, URINE DIPSTICK

## 2020-12-18 MED ORDER — SODIUM CHLORIDE 0.9 % IV SOLN
15.0000 mg/kg | Freq: Once | INTRAVENOUS | Status: AC
  Administered 2020-12-18: 1300 mg via INTRAVENOUS
  Filled 2020-12-18: qty 48

## 2020-12-18 MED ORDER — SODIUM CHLORIDE 0.9 % IV SOLN: Freq: Once | INTRAVENOUS | Status: AC

## 2020-12-18 MED ORDER — SODIUM CHLORIDE 0.9% FLUSH
10.0000 mL | Freq: Once | INTRAVENOUS | Status: AC
  Administered 2020-12-18: 10 mL

## 2020-12-18 MED ORDER — HEPARIN SOD (PORK) LOCK FLUSH 100 UNIT/ML IV SOLN
500.0000 [IU] | Freq: Once | INTRAVENOUS | Status: AC | PRN
  Administered 2020-12-18: 500 [IU]

## 2020-12-18 MED ORDER — SODIUM CHLORIDE 0.9% FLUSH
10.0000 mL | INTRAVENOUS | Status: DC | PRN
  Administered 2020-12-18: 10 mL

## 2020-12-18 NOTE — Progress Notes (Signed)
[place] Telephone:(336) [phone]   Fax:(336) [phone]  OFFICE PROGRESS NOTE  [name], MD [place] [address]  DIAGNOSIS: Stage I/II malignant pleural mesothelioma involving the left hemithorax diagnosed in September 2021.  PRIOR THERAPY: None.  CURRENT THERAPY:  Systemic chemotherapy with carboplatin for AUC of 5, Alimta 500 mg/M2 and Avastin 15 mg/KG every 3 weeks.  Status post 18 cycles.  Starting from cycle #7 the patient will be on treatment with maintenance Avastin 15 mg/KG every 3 weeks.  INTERVAL HISTORY: Jack Haynes 80 y.o. male returns to the clinic today for follow-up visit accompanied by his girlfriend.  The patient is feeling fine today with no concerning complaints except for fatigue.  He gets tired very easily especially in the afternoon.  He denied having any current chest pain, shortness of breath, cough or hemoptysis.  He has no nausea, vomiting, diarrhea or constipation.  He denied having any headache or visual changes.  He has no recent weight loss or night sweats.  He continues to tolerate his treatment with Avastin fairly well.  The patient had repeat CT scan of the chest performed yesterday and he is here for evaluation and discussion of his scan results and treatment options.   MEDICAL HISTORY: Past Medical History:  Diagnosis Date   Arthritis    Cancer ([place])    hx of prostatae cancer   Dyspnea    until chest tube placed   Hypertension    Mesothelioma [facility])     ALLERGIES:  has no allergies on file.  MEDICATIONS:  Current Outpatient Medications  Medication Sig Dispense Refill   acetaminophen (TYLENOL) 500 MG tablet Take 2 tablets (1,000 mg total) by mouth every 6 (six) hours as needed for mild pain or fever. (Patient taking differently: Take 500 mg by mouth every 6 (six) hours as needed for mild pain or fever.) 30 tablet 0   aspirin EC 81 MG tablet Take 81 mg by mouth daily. Swallow whole.     calcium  carbonate (TUMS - DOSED IN MG ELEMENTAL CALCIUM) 500 MG chewable tablet Chew 1,000 mg by mouth daily as needed for indigestion or heartburn.     Cetirizine HCl (ZYRTEC ALLERGY) 10 MG CAPS Zyrtec 10 mg capsule  Take by oral route.     dexamethasone (DECADRON) 4 MG tablet TAKE 1 TABLET BY MOUTH TWICE DAILY THE DAY BEFORE, DAY OF AND DAY AFTER CHEMOTHERAPY EVERY 3 WEEKS 40 tablet 1   empagliflozin (JARDIANCE) 10 MG TABS tablet Take 1 tablet (10 mg total) by mouth daily before breakfast. 90 tablet 1   ferrous sulfate 325 (65 FE) MG tablet Take 325 mg by mouth daily with breakfast.     fluconazole (DIFLUCAN) 100 MG tablet TAKE 1 TABLET BY MOUTH EVERY DAY (Patient taking differently: Take 100 mg by mouth daily as needed (thrush).) 7 tablet 0   folic acid (FOLVITE) 1 MG tablet Take 1 mg by mouth daily.     lidocaine-prilocaine (EMLA) cream Apply to Port-A-Cath site 30-60-minute before treatment. 30 g 0   losartan-hydrochlorothiazide (HYZAAR) 100-12.5 MG tablet Take 1 tablet by mouth daily. 90 tablet 3   metFORMIN (GLUCOPHAGE) 500 MG tablet Take 1 tablet (500 mg total) by mouth 2 (two) times daily with a meal. 180 tablet 3   ondansetron (ZOFRAN ODT) 4 MG disintegrating tablet Take 1 tablet (4 mg total) by mouth every 8 (eight) hours as needed for nausea or vomiting. Starting 3 days  after chemotherapy 30 tablet 1   prochlorperazine (COMPAZINE) 10 MG tablet TAKE 1 TABLET BY MOUTH EVERY 6 HOURS AS NEEDED FOR NAUSEA OR VOMITING. 30 tablet 0   rosuvastatin (CRESTOR) 20 MG tablet TAKE 1 TABLET BY MOUTH EVERY DAY 90 tablet 3   simethicone (MYLICON) 937 MG chewable tablet Chew 125 mg by mouth daily as needed (gas).     No current facility-administered medications for this visit.   Facility-Administered Medications Ordered in Other Visits  Medication Dose Route Frequency Provider Last Rate Last Admin   sodium chloride flush (NS) 0.9 % injection 10 mL  10 mL Intracatheter PRN Curt Bears, MD   10 mL at  12/18/20 1157    SURGICAL HISTORY:  Past Surgical History:  Procedure Laterality Date   APPENDECTOMY     BACK SURGERY  1998   disectomy   CHEST TUBE INSERTION Left 11/13/2019   Procedure: INSERTION PLEURAL DRAINAGE CATHETER;  Surgeon: Grace Isaac, MD;  Location: [facility];  Service: Thoracic;  Laterality: Left;   EYE SURGERY Left 1965   injury   IR [place]  05/27/2020   JOINT REPLACEMENT Left 2010   left knee replacement   KNEE SURGERY     PLEURAL BIOPSY Left 11/13/2019   Procedure: PLEURAL BIOPSY;  Surgeon: Grace Isaac, MD;  Location: [facility];  Service: Thoracic;  Laterality: Left;   PORTACATH PLACEMENT Left 11/26/2019   Procedure: INSERTION PORT-A-CATH BARD POWERPORT 9.6 FR CATHETER;  Surgeon: Grace Isaac, MD;  Location: [place];  Service: Thoracic;  Laterality: Left;   PROSTATE SURGERY     REMOVAL OF PLEURAL DRAINAGE CATHETER Left 11/26/2019   Procedure: REMOVAL OF PLEURAL DRAINAGE CATHETER;  Surgeon: Grace Isaac, MD;  Location: [place];  Service: Thoracic;  Laterality: Left;   SPINE SURGERY     TALC PLEURODESIS Left 11/13/2019   Procedure: possible TALC PLEURADESIS;  Surgeon: Grace Isaac, MD;  Location: [place];  Service: Thoracic;  Laterality: Left;   TOTAL KNEE ARTHROPLASTY  02/01/2012   Procedure: TOTAL KNEE ARTHROPLASTY;  Surgeon: Sydnee Cabal, MD;  [facility];  Service: Orthopedics;  Laterality: Left;   VIDEO ASSISTED THORACOSCOPY Left 11/13/2019   Procedure: VIDEO ASSISTED THORACOSCOPY;  Surgeon: Grace Isaac, MD;  Location: [place];  Service: Thoracic;  Laterality: Left;   VIDEO BRONCHOSCOPY N/A 11/13/2019   Procedure: VIDEO BRONCHOSCOPY;  Surgeon: Grace Isaac, MD;  Location: [place];  Service: Thoracic;  Laterality: N/A;    REVIEW OF SYSTEMS:  Constitutional: positive for fatigue Eyes: negative Ears, nose, mouth, throat, and face: negative Respiratory: negative Cardiovascular:  negative Gastrointestinal: negative Genitourinary:negative Integument/breast: negative Hematologic/lymphatic: negative Musculoskeletal:negative Neurological: negative Behavioral/Psych: negative Endocrine: negative Allergic/Immunologic: negative   PHYSICAL EXAMINATION: General appearance: alert, cooperative, fatigued, and no distress Head: Normocephalic, without obvious abnormality, atraumatic Neck: no adenopathy, no JVD, supple, symmetrical, trachea midline, and thyroid not enlarged, symmetric, no tenderness/mass/nodules Lymph nodes: Cervical, supraclavicular, and axillary nodes normal. Resp: clear to auscultation bilaterally Back: symmetric, no curvature. ROM normal. No CVA tenderness. Cardio: regular rate and rhythm, S1, S2 normal, no murmur, click, rub or gallop GI: soft, non-tender; bowel sounds normal; no masses,  no organomegaly Extremities: extremities normal, atraumatic, no cyanosis or edema Neurologic: Alert and oriented X 3, normal strength and tone. Normal symmetric reflexes. Normal coordination and gait  ECOG PERFORMANCE STATUS: 1 - Symptomatic but completely ambulatory  Blood pressure 128/89, pulse 83, temperature 97.6 F (36.4 C), temperature source Oral,  resp. rate 19, height 5\' 7"  (1.702 m), weight 203 lb 12.8 oz (92.4 kg), SpO2 96 %.  LABORATORY DATA: Lab Results  Component Value Date   WBC 9.3 12/18/2020   HGB 12.7 (L) 12/18/2020   HCT 37.2 (L) 12/18/2020   MCV 89.0 12/18/2020   PLT 162 12/18/2020      Chemistry      Component Value Date/Time   NA 139 12/18/2020 0827   NA 141 09/24/2019 0832   K 4.2 12/18/2020 0827   CL 103 12/18/2020 0827   CO2 24 12/18/2020 0827   BUN 21 12/18/2020 0827   BUN 17 09/24/2019 0832   CREATININE 1.16 12/18/2020 0827   CREATININE 1.01 12/24/2014 0916      Component Value Date/Time   CALCIUM 9.7 12/18/2020 0827   ALKPHOS 58 12/18/2020 0827   AST 14 (L) 12/18/2020 0827   ALT 13 12/18/2020 0827   BILITOT 0.5  12/18/2020 0827       RADIOGRAPHIC STUDIES: CT Chest W Contrast  Result Date: 12/17/2020 CLINICAL DATA:  Malignant mesothelioma involving the left hemithorax diagnosed 9/21. EXAM: CT CHEST WITH CONTRAST TECHNIQUE: Multidetector CT imaging of the chest was performed during intravenous contrast administration. CONTRAST:  42mL OMNIPAQUE IOHEXOL 350 MG/ML SOLN COMPARISON:  10/15/2020 FINDINGS: Cardiovascular: Left Port-A-Cath tip at low SVC. Aortic atherosclerosis. Mild cardiomegaly with 3 vessel coronary artery calcification. No central pulmonary embolism, on this non-dedicated study. Mediastinum/Nodes: No supraclavicular adenopathy. No mediastinal or hilar adenopathy. Lungs/Pleura: Circumferential left-sided pleural thickening is again identified. Index nodular component medially measures 2.7 x 2.2 cm on 103/2 versus 2.8 x 1.9 cm on the prior, felt to be similar. More inferior and anterior nodular component measures 2.6 x 2.0 cm on 109/2 versus 2.5 x 2.0 cm on the prior exam (when remeasured). Trace left pleural fluid inferiorly. No right-sided pleural thickening. Areas of left lung scarring and subsegmental atelectasis. No pulmonary nodule or mass. Upper Abdomen: Normal imaged portions of the liver, spleen, stomach, pancreas, gallbladder, adrenal glands, kidneys. Musculoskeletal: No acute osseous abnormality. IMPRESSION: 1. No significant change in circumferential left-sided pleural thickening and nodularity, consistent with the clinical history of mesothelioma. 2. No thoracic adenopathy. 3. Coronary artery atherosclerosis. Aortic Atherosclerosis (ICD10-I70.0). Electronically Signed   By: Abigail Miyamoto M.D.   On: 12/17/2020 10:59     ASSESSMENT AND PLAN: This is a very pleasant 80 years old white male recently diagnosed with malignant pleural mesothelioma involving the left hemithorax. The patient is currently undergoing systemic chemotherapy with carboplatin for AUC of 5, Alimta 500 mg/M2 and Avastin 15  mg/KG every 3 weeks.  Status post 18 cycles.  Starting from cycle #7 the patient will be on maintenance treatment with Avastin 15 mg/KG every 3 weeks. The patient has been tolerating this treatment well with no concerning adverse effect except for fatigue. He had repeat CT scan of the chest performed recently.  I personally and independently reviewed the scan and discussed the results with the patient and his girlfriend. His scan showed no concerning findings for disease progression. I recommended for him to continue his current treatment with maintenance Avastin and he will proceed with cycle #19 today. I will see him back for follow-up visit in 3 weeks for evaluation before the next cycle of his treatment. The patient was advised to call immediately if he has any concerning symptoms in the interval. The patient voices understanding of current disease status and treatment options and is in agreement with the current care plan.  All  questions were answered. The patient knows to call the clinic with any problems, questions or concerns. We can certainly see the patient much sooner if necessary.   Disclaimer: This note was dictated with voice recognition software. Similar sounding words can inadvertently be transcribed and may not be corrected upon review.

## 2020-12-18 NOTE — Telephone Encounter (Signed)
I returned his friends call about his schedule. I was unable to get her on the phone or leave a message.

## 2020-12-18 NOTE — Patient Instructions (Addendum)
[facility]  Discharge Instructions: °Thank you for choosing [facility] to provide your oncology and hematology care.  ° °If you have a lab appointment with the Cancer Center, please go directly to the Cancer Center and check in at the registration area. °  °Wear comfortable clothing and clothing appropriate for easy access to any Portacath or PICC line.  ° °We strive to give you quality time with your provider. You may need to reschedule your appointment if you arrive late (15 or more minutes).  Arriving late affects you and other patients whose appointments are after yours.  Also, if you miss three or more appointments without notifying the office, you may be dismissed from the clinic at the provider’s discretion.    °  °For prescription refill requests, have your pharmacy contact our office and allow 72 hours for refills to be completed.   ° °Today you received the following chemotherapy and/or immunotherapy agents: Bevacizumab.     °  °To help prevent nausea and vomiting after your treatment, we encourage you to take your nausea medication as directed. ° °BELOW ARE SYMPTOMS THAT SHOULD BE REPORTED IMMEDIATELY: °*FEVER GREATER THAN 100.4 F (38 °C) OR HIGHER °*CHILLS OR SWEATING °*NAUSEA AND VOMITING THAT IS NOT CONTROLLED WITH YOUR NAUSEA MEDICATION °*UNUSUAL SHORTNESS OF BREATH °*UNUSUAL BRUISING OR BLEEDING °*URINARY PROBLEMS (pain or burning when urinating, or frequent urination) °*BOWEL PROBLEMS (unusual diarrhea, constipation, pain near the anus) °TENDERNESS IN MOUTH AND THROAT WITH OR WITHOUT PRESENCE OF ULCERS (sore throat, sores in mouth, or a toothache) °UNUSUAL RASH, SWELLING OR PAIN  °UNUSUAL VAGINAL DISCHARGE OR ITCHING  ° °Items with * indicate a potential emergency and should be followed up as soon as possible or go to the Emergency Department if any problems should occur. ° °Please show the CHEMOTHERAPY ALERT CARD or IMMUNOTHERAPY ALERT CARD at check-in  to the Emergency Department and triage nurse. ° °Should you have questions after your visit or need to cancel or reschedule your appointment, please contact [facility]  Dept: [phone]  and follow the prompts.  Office hours are 8:00 a.m. to 4:30 p.m. Monday - Friday. Please note that voicemails left after 4:00 p.m. may not be returned until the following business day.  We are closed weekends and major holidays. You have access to a nurse at all times for urgent questions. Please call the main number to the clinic Dept: [phone] and follow the prompts. ° ° °For any non-urgent questions, you may also contact your provider using MyChart. We now offer e-Visits for anyone 18 and older to request care online for non-urgent symptoms. For details visit [email or web address]. °  °Also download the MyChart app! Go to the app store, search "MyChart", open the app, select [facility], and log in with your MyChart username and password. ° °Due to Covid, a mask is required upon entering the hospital/clinic. If you do not have a mask, one will be given to you upon arrival. For doctor visits, patients may have 1 support person aged 18 or older with them. For treatment visits, patients cannot have anyone with them due to current Covid guidelines and our immunocompromised population.  ° °

## 2020-12-30 NOTE — Progress Notes (Addendum)
[facility] OFFICE PROGRESS NOTE  [name], MD [place] [id number]  DIAGNOSIS: Stage I/II malignant pleural mesothelioma involving the left hemithorax diagnosed in September 2021.  PRIOR THERAPY: None   CURRENT THERAPY: Systemic chemotherapy with carboplatin for AUC of 5, Alimta 500 mg/M2 and Avastin 15 mg/KG every 3 weeks.  Status post 19 cycles.  Starting from cycle #7 the patient will be on treatment with maintenance Avastin 15 mg/KG every 3 weeks.   INTERVAL HISTORY: Jack Haynes 80 y.o. male returns to the clinic for a follow up visit. The patient is feeling well today without any concerning complaints except for the last 7-10 days he notes some post nasal drainage in the AM. He is wondering what he can take. He has mild sinus pressure but not daily and it does not last long before resolving. Denies sore throat, sick contacts, fevers, chills, and is well overall. He did report an unusual symptom that every morning he blows his right nostil and a small "bluish/purple" object comes out. He states it is soft but states it is not a blood clot. He states then it does not occur again until the following day. The patient continues to tolerate treatment with single agent Avastin well without any adverse effects. Denies any night sweats or weight loss. He has a strong appetite. Denies any chest pain, cough, or hemoptysis. His mild dyspnea on exertion is stable. Denies any nausea, vomiting, or diarrhea.  He sometimes has mild constipation and will take a stool softener. Denies any balance changes, falls, visual changes. Denies any rashes or skin changes except he has a small dry lesion on his right ear. He has tried iodine, lotion, and hydrocortisone cream without improvement. The patient denies any abnormal bleeding or bruising. The patient is here today for evaluation prior to starting cycle # 20  MEDICAL HISTORY: Past Medical History:  Diagnosis Date    Arthritis    Cancer ([place])    hx of prostatae cancer   Dyspnea    until chest tube placed   Hypertension    Mesothelioma [facility])     ALLERGIES:  has no allergies on file.  MEDICATIONS:  Current Outpatient Medications  Medication Sig Dispense Refill   acetaminophen (TYLENOL) 500 MG tablet Take 2 tablets (1,000 mg total) by mouth every 6 (six) hours as needed for mild pain or fever. (Patient taking differently: Take 500 mg by mouth every 6 (six) hours as needed for mild pain or fever.) 30 tablet 0   aspirin EC 81 MG tablet Take 81 mg by mouth daily. Swallow whole.     calcium carbonate (TUMS - DOSED IN MG ELEMENTAL CALCIUM) 500 MG chewable tablet Chew 1,000 mg by mouth daily as needed for indigestion or heartburn.     Cetirizine HCl (ZYRTEC ALLERGY) 10 MG CAPS Zyrtec 10 mg capsule  Take by oral route.     dexamethasone (DECADRON) 4 MG tablet TAKE 1 TABLET BY MOUTH TWICE DAILY THE DAY BEFORE, DAY OF AND DAY AFTER CHEMOTHERAPY EVERY 3 WEEKS 40 tablet 1   empagliflozin (JARDIANCE) 10 MG TABS tablet Take 1 tablet (10 mg total) by mouth daily before breakfast. 90 tablet 1   ferrous sulfate 325 (65 FE) MG tablet Take 325 mg by mouth daily with breakfast.     fluconazole (DIFLUCAN) 100 MG tablet TAKE 1 TABLET BY MOUTH EVERY DAY (Patient taking differently: Take 100 mg by mouth daily as needed (thrush).) 7 tablet 0  folic acid (FOLVITE) 1 MG tablet Take 1 mg by mouth daily.     lidocaine-prilocaine (EMLA) cream Apply to Port-A-Cath site 30-60-minute before treatment. 30 g 0   losartan-hydrochlorothiazide (HYZAAR) 100-12.5 MG tablet TAKE 1 TABLET BY MOUTH EVERY DAY 90 tablet 3   metFORMIN (GLUCOPHAGE) 500 MG tablet Take 1 tablet (500 mg total) by mouth 2 (two) times daily with a meal. 180 tablet 3   ondansetron (ZOFRAN ODT) 4 MG disintegrating tablet Take 1 tablet (4 mg total) by mouth every 8 (eight) hours as needed for nausea or vomiting. Starting 3 days after chemotherapy 30 tablet 1    prochlorperazine (COMPAZINE) 10 MG tablet TAKE 1 TABLET BY MOUTH EVERY 6 HOURS AS NEEDED FOR NAUSEA OR VOMITING. 30 tablet 0   rosuvastatin (CRESTOR) 20 MG tablet TAKE 1 TABLET BY MOUTH EVERY DAY 90 tablet 3   simethicone (MYLICON) 025 MG chewable tablet Chew 125 mg by mouth daily as needed (gas).     No current facility-administered medications for this visit.    SURGICAL HISTORY:  Past Surgical History:  Procedure Laterality Date   APPENDECTOMY     BACK SURGERY  1998   disectomy   CHEST TUBE INSERTION Left 11/13/2019   Procedure: INSERTION PLEURAL DRAINAGE CATHETER;  Surgeon: Grace Isaac, MD;  Location: [place];  Service: Thoracic;  Laterality: Left;   EYE SURGERY Left 1965   injury   IR [place]  05/27/2020   JOINT REPLACEMENT Left 2010   left knee replacement   KNEE SURGERY     PLEURAL BIOPSY Left 11/13/2019   Procedure: PLEURAL BIOPSY;  Surgeon: Grace Isaac, MD;  Location: [place];  Service: Thoracic;  Laterality: Left;   PORTACATH PLACEMENT Left 11/26/2019   Procedure: INSERTION PORT-A-CATH BARD POWERPORT 9.6 FR CATHETER;  Surgeon: Grace Isaac, MD;  Location: [place];  Service: Thoracic;  Laterality: Left;   PROSTATE SURGERY     REMOVAL OF PLEURAL DRAINAGE CATHETER Left 11/26/2019   Procedure: REMOVAL OF PLEURAL DRAINAGE CATHETER;  Surgeon: Grace Isaac, MD;  Location: [place];  Service: Thoracic;  Laterality: Left;   SPINE SURGERY     TALC PLEURODESIS Left 11/13/2019   Procedure: possible TALC PLEURADESIS;  Surgeon: Grace Isaac, MD;  Location: [place];  Service: Thoracic;  Laterality: Left;   TOTAL KNEE ARTHROPLASTY  02/01/2012   Procedure: TOTAL KNEE ARTHROPLASTY;  Surgeon: Sydnee Cabal, MD;  Location: [facility];  Service: Orthopedics;  Laterality: Left;   VIDEO ASSISTED THORACOSCOPY Left 11/13/2019   Procedure: VIDEO ASSISTED THORACOSCOPY;  Surgeon: Grace Isaac, MD;  Location: [place];  Service: Thoracic;  Laterality:  Left;   VIDEO BRONCHOSCOPY N/A 11/13/2019   Procedure: VIDEO BRONCHOSCOPY;  Surgeon: Grace Isaac, MD;  Location: [facility];  Service: Thoracic;  Laterality: N/A;    REVIEW OF SYSTEMS:   Review of Systems  Constitutional: Negative for appetite change, chills, fatigue, fever and unexpected weight change.  HENT: Positive for post nasal drainage. Negative for mouth sores, nosebleeds, sore throat and trouble swallowing.   Eyes: Negative for eye problems and icterus.  Respiratory: Positive for dyspnea on exertion. Negative for cough, hemoptysis, and wheezing.   Cardiovascular: Negative for chest pain and leg swelling.  Gastrointestinal: Positive for mild occasional constipation. Negative for abdominal pain,  diarrhea, nausea and vomiting.  Genitourinary: Negative for bladder incontinence, difficulty urinating, dysuria, frequency and hematuria.   Musculoskeletal: Negative for back pain, gait problem, neck pain and  neck stiffness.  Skin: Positive for small dry lesion on right ear. Negative for itching and rash.  Neurological: Positive for minor self limiting headaches/sinus pressure in the AM the last 7-10 days. Negative for dizziness, extremity weakness, gait problem, light-headedness and seizures.  Hematological: Negative for adenopathy. Does not bruise/bleed easily.  Psychiatric/Behavioral: Negative for confusion, depression and sleep disturbance. The patient is not nervous/anxious.     PHYSICAL EXAMINATION:  Blood pressure 129/79, pulse 87, temperature (!) 97.3 F (36.3 C), temperature source Tympanic, resp. rate 17, weight 202 lb 4 oz (91.7 kg), SpO2 94 %.  ECOG PERFORMANCE STATUS: 1  Physical Exam  Constitutional: Oriented to person, place, and time and well-developed, well-nourished, and in no distress.  HENT:  Head: Normocephalic and atraumatic.  Mouth/Throat: Oropharynx is clear and moist. No oropharyngeal exudate.  Eyes: Conjunctivae are normal. Right eye exhibits no discharge.  Left eye exhibits no discharge. No scleral icterus.  Neck: Normal range of motion. Neck supple.  Cardiovascular: Normal rate, regular rhythm, normal heart sounds and intact distal pulses.   Pulmonary/Chest: Effort normal. Quiet breath sounds bilaterally. No respiratory distress. No wheezes. No rales.  Abdominal: Soft. Bowel sounds are normal. Exhibits no distension and no mass. There is no tenderness.  Musculoskeletal: Normal range of motion. Exhibits no edema.  Lymphadenopathy:    No cervical adenopathy.  Neurological: Alert and oriented to person, place, and time. Exhibits normal muscle tone. Gait normal. Coordination normal.  Skin: Skin is warm and dry. No rash noted. Not diaphoretic. No erythema. No pallor.  Psychiatric: Mood, memory and judgment normal.  Vitals reviewed.  LABORATORY DATA: Lab Results  Component Value Date   WBC 6.8 01/07/2021   HGB 12.2 (L) 01/07/2021   HCT 35.9 (L) 01/07/2021   MCV 88.9 01/07/2021   PLT 152 01/07/2021      Chemistry      Component Value Date/Time   NA 137 01/07/2021 0847   NA 141 09/24/2019 0832   K 4.1 01/07/2021 0847   CL 101 01/07/2021 0847   CO2 25 01/07/2021 0847   BUN 23 01/07/2021 0847   BUN 17 09/24/2019 0832   CREATININE 1.54 (H) 01/07/2021 0847   CREATININE 1.01 12/24/2014 0916      Component Value Date/Time   CALCIUM 8.8 (L) 01/07/2021 0847   ALKPHOS 61 01/07/2021 0847   AST 12 (L) 01/07/2021 0847   ALT 12 01/07/2021 0847   BILITOT 0.6 01/07/2021 0847       RADIOGRAPHIC STUDIES:  CT Chest W Contrast  Result Date: 12/17/2020 CLINICAL DATA:  Malignant mesothelioma involving the left hemithorax diagnosed 9/21. EXAM: CT CHEST WITH CONTRAST TECHNIQUE: Multidetector CT imaging of the chest was performed during intravenous contrast administration. CONTRAST:  58mL OMNIPAQUE IOHEXOL 350 MG/ML SOLN COMPARISON:  10/15/2020 FINDINGS: Cardiovascular: Left Port-A-Cath tip at low SVC. Aortic atherosclerosis. Mild cardiomegaly with  3 vessel coronary artery calcification. No central pulmonary embolism, on this non-dedicated study. Mediastinum/Nodes: No supraclavicular adenopathy. No mediastinal or hilar adenopathy. Lungs/Pleura: Circumferential left-sided pleural thickening is again identified. Index nodular component medially measures 2.7 x 2.2 cm on 103/2 versus 2.8 x 1.9 cm on the prior, felt to be similar. More inferior and anterior nodular component measures 2.6 x 2.0 cm on 109/2 versus 2.5 x 2.0 cm on the prior exam (when remeasured). Trace left pleural fluid inferiorly. No right-sided pleural thickening. Areas of left lung scarring and subsegmental atelectasis. No pulmonary nodule or mass. Upper Abdomen: Normal imaged portions of the liver, spleen, stomach, pancreas,  gallbladder, adrenal glands, kidneys. Musculoskeletal: No acute osseous abnormality. IMPRESSION: 1. No significant change in circumferential left-sided pleural thickening and nodularity, consistent with the clinical history of mesothelioma. 2. No thoracic adenopathy. 3. Coronary artery atherosclerosis. Aortic Atherosclerosis (ICD10-I70.0). Electronically Signed   By: Abigail Miyamoto M.D.   On: 12/17/2020 10:59     ASSESSMENT/PLAN:  This is a very pleasant 80 year old Caucasian male diagnosed with malignant mesothelioma involving the left hemithorax.  The patient was diagnosed in September 2021.   The patient is currently undergoing systemic chemotherapy with carboplatin for AUC of 5, Alimta 500 mg/M2 and Avastin 15 mg/KG every 3 weeks.  Status post 19 cycles.  Starting from cycle #7 the patient will be on maintenance treatment with Avastin 15 mg/KG every 3 weeks. The patient has been tolerating this treatment well with no concerning adverse effects.   Labs reviewed.  Recommend that he proceed cycle 20 today scheduled.  We will see him back for follow-up visit in 3 weeks for evaluation before starting cycle #21.  For his nasal congestion, advised him to take an  anti-histamine and he can try to take mucinex. He thinks he may have Flonase at home and let him know he can use that if needed.   For the small dry skin lesion on his ear, advised him to see his PCP or dermatologist if he has one for further evaluation.   I am not sure what to make of the bluish/purple small object that he states he blows from his right nostril in the AM. Talked to pharmacy who recommended azithromycin. Will send to pharmacy.   The patient was advised to call immediately if he has any concerning symptoms in the interval. The patient voices understanding of current disease status and treatment options and is in agreement with the current care plan. All questions were answered. The patient knows to call the clinic with any problems, questions or concerns. We can certainly see the patient much sooner if necessary      No orders of the defined types were placed in this encounter.    The total time spent in the appointment was 20-29 minutes  [place], PA-C 01/07/21

## 2021-01-02 ENCOUNTER — Other Ambulatory Visit: Payer: Self-pay | Admitting: Emergency Medicine

## 2021-01-02 DIAGNOSIS — I10 Essential (primary) hypertension: Secondary | ICD-10-CM

## 2021-01-06 DIAGNOSIS — H40023 Open angle with borderline findings, high risk, bilateral: Secondary | ICD-10-CM | POA: Diagnosis not present

## 2021-01-06 DIAGNOSIS — H2513 Age-related nuclear cataract, bilateral: Secondary | ICD-10-CM | POA: Diagnosis not present

## 2021-01-06 DIAGNOSIS — H04123 Dry eye syndrome of bilateral lacrimal glands: Secondary | ICD-10-CM | POA: Diagnosis not present

## 2021-01-06 DIAGNOSIS — H25013 Cortical age-related cataract, bilateral: Secondary | ICD-10-CM | POA: Diagnosis not present

## 2021-01-07 ENCOUNTER — Other Ambulatory Visit: Payer: Self-pay

## 2021-01-07 ENCOUNTER — Inpatient Hospital Stay (HOSPITAL_BASED_OUTPATIENT_CLINIC_OR_DEPARTMENT_OTHER): Payer: PPO | Admitting: Physician Assistant

## 2021-01-07 ENCOUNTER — Other Ambulatory Visit: Payer: PPO

## 2021-01-07 ENCOUNTER — Inpatient Hospital Stay: Payer: PPO

## 2021-01-07 ENCOUNTER — Encounter: Payer: Self-pay | Admitting: Physician Assistant

## 2021-01-07 ENCOUNTER — Other Ambulatory Visit: Payer: Self-pay | Admitting: Internal Medicine

## 2021-01-07 ENCOUNTER — Other Ambulatory Visit: Payer: Self-pay | Admitting: Physician Assistant

## 2021-01-07 VITALS — BP 129/79 | HR 87 | Temp 97.3°F | Resp 17 | Wt 202.2 lb

## 2021-01-07 VITALS — BP 139/80 | HR 80

## 2021-01-07 DIAGNOSIS — C457 Mesothelioma of other sites: Secondary | ICD-10-CM

## 2021-01-07 DIAGNOSIS — J329 Chronic sinusitis, unspecified: Secondary | ICD-10-CM

## 2021-01-07 DIAGNOSIS — Z5111 Encounter for antineoplastic chemotherapy: Secondary | ICD-10-CM

## 2021-01-07 DIAGNOSIS — Z95828 Presence of other vascular implants and grafts: Secondary | ICD-10-CM

## 2021-01-07 LAB — CMP (CANCER CENTER ONLY)
ALT: 12 U/L (ref 0–44)
AST: 12 U/L — ABNORMAL LOW (ref 15–41)
Albumin: 3.7 g/dL (ref 3.5–5.0)
Alkaline Phosphatase: 61 U/L (ref 38–126)
Anion gap: 11 (ref 5–15)
BUN: 23 mg/dL (ref 8–23)
CO2: 25 mmol/L (ref 22–32)
Calcium: 8.8 mg/dL — ABNORMAL LOW (ref 8.9–10.3)
Chloride: 101 mmol/L (ref 98–111)
Creatinine: 1.54 mg/dL — ABNORMAL HIGH (ref 0.61–1.24)
GFR, Estimated: 45 mL/min — ABNORMAL LOW (ref >60)
Glucose, Bld: 287 mg/dL — ABNORMAL HIGH (ref 70–99)
Potassium: 4.1 mmol/L (ref 3.5–5.1)
Sodium: 137 mmol/L (ref 135–145)
Total Bilirubin: 0.6 mg/dL (ref 0.3–1.2)
Total Protein: 7 g/dL (ref 6.5–8.1)

## 2021-01-07 LAB — CBC WITH DIFFERENTIAL (CANCER CENTER ONLY)
Abs Immature Granulocytes: 0.04 10*3/uL (ref 0.00–0.07)
Basophils Absolute: 0 10*3/uL (ref 0.0–0.1)
Basophils Relative: 0 %
Eosinophils Absolute: 0 10*3/uL (ref 0.0–0.5)
Eosinophils Relative: 0 %
HCT: 35.9 % — ABNORMAL LOW (ref 39.0–52.0)
Hemoglobin: 12.2 g/dL — ABNORMAL LOW (ref 13.0–17.0)
Immature Granulocytes: 1 %
Lymphocytes Relative: 10 %
Lymphs Abs: 0.7 10*3/uL (ref 0.7–4.0)
MCH: 30.2 pg (ref 26.0–34.0)
MCHC: 34 g/dL (ref 30.0–36.0)
MCV: 88.9 fL (ref 80.0–100.0)
Monocytes Absolute: 0.6 10*3/uL (ref 0.1–1.0)
Monocytes Relative: 8 %
Neutro Abs: 5.5 10*3/uL (ref 1.7–7.7)
Neutrophils Relative %: 81 %
Platelet Count: 152 10*3/uL (ref 150–400)
RBC: 4.04 MIL/uL — ABNORMAL LOW (ref 4.22–5.81)
RDW: 13.7 % (ref 11.5–15.5)
WBC Count: 6.8 10*3/uL (ref 4.0–10.5)
nRBC: 0 % (ref 0.0–0.2)

## 2021-01-07 LAB — TOTAL PROTEIN, URINE DIPSTICK

## 2021-01-07 MED ORDER — AZITHROMYCIN 250 MG PO TABS: ORAL_TABLET | ORAL | 0 refills | Status: DC

## 2021-01-07 MED ORDER — SODIUM CHLORIDE 0.9 % IV SOLN
15.0000 mg/kg | Freq: Once | INTRAVENOUS | Status: AC
  Administered 2021-01-07: 1300 mg via INTRAVENOUS
  Filled 2021-01-07: qty 4

## 2021-01-07 MED ORDER — SODIUM CHLORIDE 0.9% FLUSH
10.0000 mL | Freq: Once | INTRAVENOUS | Status: AC
  Administered 2021-01-07: 10 mL

## 2021-01-07 MED ORDER — SODIUM CHLORIDE 0.9 % IV SOLN: Freq: Once | INTRAVENOUS | Status: AC

## 2021-01-07 MED ORDER — SODIUM CHLORIDE 0.9% FLUSH
10.0000 mL | INTRAVENOUS | Status: DC | PRN
  Administered 2021-01-07: 10 mL

## 2021-01-07 MED ORDER — HEPARIN SOD (PORK) LOCK FLUSH 100 UNIT/ML IV SOLN
500.0000 [IU] | Freq: Once | INTRAVENOUS | Status: AC | PRN
  Administered 2021-01-07: 500 [IU]

## 2021-01-07 NOTE — Patient Instructions (Signed)
[facility] MEDICAL ONCOLOGY  Discharge Instructions: °Thank you for choosing [facility] to provide your oncology and hematology care.  ° °If you have a lab appointment with the Cancer Center, please go directly to the Cancer Center and check in at the registration area. °  °Wear comfortable clothing and clothing appropriate for easy access to any Portacath or PICC line.  ° °We strive to give you quality time with your provider. You may need to reschedule your appointment if you arrive late (15 or more minutes).  Arriving late affects you and other patients whose appointments are after yours.  Also, if you miss three or more appointments without notifying the office, you may be dismissed from the clinic at the provider’s discretion.    °  °For prescription refill requests, have your pharmacy contact our office and allow 72 hours for refills to be completed.   ° °Today you received the following chemotherapy and/or immunotherapy agents: bevacizumab    °  °To help prevent nausea and vomiting after your treatment, we encourage you to take your nausea medication as directed. ° °BELOW ARE SYMPTOMS THAT SHOULD BE REPORTED IMMEDIATELY: °*FEVER GREATER THAN 100.4 F (38 °C) OR HIGHER °*CHILLS OR SWEATING °*NAUSEA AND VOMITING THAT IS NOT CONTROLLED WITH YOUR NAUSEA MEDICATION °*UNUSUAL SHORTNESS OF BREATH °*UNUSUAL BRUISING OR BLEEDING °*URINARY PROBLEMS (pain or burning when urinating, or frequent urination) °*BOWEL PROBLEMS (unusual diarrhea, constipation, pain near the anus) °TENDERNESS IN MOUTH AND THROAT WITH OR WITHOUT PRESENCE OF ULCERS (sore throat, sores in mouth, or a toothache) °UNUSUAL RASH, SWELLING OR PAIN  °UNUSUAL VAGINAL DISCHARGE OR ITCHING  ° °Items with * indicate a potential emergency and should be followed up as soon as possible or go to the Emergency Department if any problems should occur. ° °Please show the CHEMOTHERAPY ALERT CARD or IMMUNOTHERAPY ALERT CARD at check-in  to the Emergency Department and triage nurse. ° °Should you have questions after your visit or need to cancel or reschedule your appointment, please contact [facility]COL[facility]  Dept: [phone]  and follow the prompts.  Office hours are 8:00 a.m. to 4:30 p.m. Monday - Friday. Please note that voicemails left after 4:00 p.m. may not be returned until the following business day.  We are closed weekends and major holidays. You have access to a nurse at all times for urgent questions. Please call the main number to the clinic Dept: [phone] and follow the prompts. ° ° °For any non-urgent questions, you may also contact your provider using MyChart. We now offer e-Visits for anyone 18 and older to request care online for non-urgent symptoms. For details visit [email or web address]. °  °Also download the MyChart app! Go to the app store, search "MyChart", open the app, select [place], and log in with your MyChart username and password. ° °Due to Covid, a mask is required upon entering the hospital/clinic. If you do not have a mask, one will be given to you upon arrival. For doctor visits, patients may have 1 support person aged 18 or older with them. For treatment visits, patients cannot have anyone with them due to current Covid guidelines and our immunocompromised population.  ° °

## 2021-01-13 DIAGNOSIS — Z85828 Personal history of other malignant neoplasm of skin: Secondary | ICD-10-CM | POA: Diagnosis not present

## 2021-01-13 DIAGNOSIS — H61001 Unspecified perichondritis of right external ear: Secondary | ICD-10-CM | POA: Diagnosis not present

## 2021-01-20 ENCOUNTER — Telehealth: Payer: Self-pay | Admitting: Emergency Medicine

## 2021-01-20 ENCOUNTER — Telehealth: Payer: Self-pay

## 2021-01-20 ENCOUNTER — Other Ambulatory Visit: Payer: Self-pay | Admitting: Internal Medicine

## 2021-01-20 DIAGNOSIS — B370 Candidal stomatitis: Secondary | ICD-10-CM

## 2021-01-20 MED ORDER — FLUCONAZOLE 100 MG PO TABS: 100.0000 mg | ORAL_TABLET | Freq: Every day | ORAL | 0 refills | Status: DC

## 2021-01-20 NOTE — Telephone Encounter (Signed)
Type of form received: handicapped placard  Form placed in: Provider mailbox  Additional instructions from the patient:  fax and notify by phone when complete. Original copy mailed to address on file.    Things to remember: [place] office: If form received in person, remind patient that forms take 7-10 business days CMA should attach charge sheet and put on Supervisor's desk

## 2021-01-20 NOTE — Telephone Encounter (Signed)
Pts friend left a message request a rx of Diflucan or a mouth wash to help pt with the soreness in his mouth. She states the pt does not have ulcers but c/o burning and soreness when he chews and swallows.

## 2021-01-26 NOTE — Telephone Encounter (Signed)
Called and spoke with pt, he states that he just need the provider to sign the second form and fax back. Form signed and faxed 01/23/21.

## 2021-01-29 ENCOUNTER — Other Ambulatory Visit: Payer: PPO

## 2021-01-29 ENCOUNTER — Inpatient Hospital Stay: Payer: PPO

## 2021-01-29 ENCOUNTER — Inpatient Hospital Stay: Payer: PPO | Attending: Internal Medicine | Admitting: Internal Medicine

## 2021-01-29 ENCOUNTER — Other Ambulatory Visit: Payer: Self-pay

## 2021-01-29 VITALS — BP 148/81 | HR 95 | Temp 97.0°F | Resp 18 | Ht 67.0 in | Wt 195.6 lb

## 2021-01-29 DIAGNOSIS — Z79899 Other long term (current) drug therapy: Secondary | ICD-10-CM | POA: Diagnosis not present

## 2021-01-29 DIAGNOSIS — Z5112 Encounter for antineoplastic immunotherapy: Secondary | ICD-10-CM | POA: Diagnosis not present

## 2021-01-29 DIAGNOSIS — Z7952 Long term (current) use of systemic steroids: Secondary | ICD-10-CM | POA: Insufficient documentation

## 2021-01-29 DIAGNOSIS — C450 Mesothelioma of pleura: Secondary | ICD-10-CM | POA: Insufficient documentation

## 2021-01-29 DIAGNOSIS — I10 Essential (primary) hypertension: Secondary | ICD-10-CM | POA: Diagnosis not present

## 2021-01-29 DIAGNOSIS — C457 Mesothelioma of other sites: Secondary | ICD-10-CM

## 2021-01-29 DIAGNOSIS — Z7984 Long term (current) use of oral hypoglycemic drugs: Secondary | ICD-10-CM | POA: Insufficient documentation

## 2021-01-29 DIAGNOSIS — Z7982 Long term (current) use of aspirin: Secondary | ICD-10-CM | POA: Insufficient documentation

## 2021-01-29 DIAGNOSIS — Z95828 Presence of other vascular implants and grafts: Secondary | ICD-10-CM

## 2021-01-29 LAB — CMP (CANCER CENTER ONLY)
ALT: 10 U/L (ref 0–44)
AST: 12 U/L — ABNORMAL LOW (ref 15–41)
Albumin: 3.7 g/dL (ref 3.5–5.0)
Alkaline Phosphatase: 60 U/L (ref 38–126)
Anion gap: 13 (ref 5–15)
BUN: 32 mg/dL — ABNORMAL HIGH (ref 8–23)
CO2: 23 mmol/L (ref 22–32)
Calcium: 9 mg/dL (ref 8.9–10.3)
Chloride: 102 mmol/L (ref 98–111)
Creatinine: 1.59 mg/dL — ABNORMAL HIGH (ref 0.61–1.24)
GFR, Estimated: 44 mL/min — ABNORMAL LOW (ref >60)
Glucose, Bld: 221 mg/dL — ABNORMAL HIGH (ref 70–99)
Potassium: 4.5 mmol/L (ref 3.5–5.1)
Sodium: 138 mmol/L (ref 135–145)
Total Bilirubin: 0.6 mg/dL (ref 0.3–1.2)
Total Protein: 7.1 g/dL (ref 6.5–8.1)

## 2021-01-29 LAB — CBC WITH DIFFERENTIAL (CANCER CENTER ONLY)
Abs Immature Granulocytes: 0.05 10*3/uL (ref 0.00–0.07)
Basophils Absolute: 0 10*3/uL (ref 0.0–0.1)
Basophils Relative: 0 %
Eosinophils Absolute: 0 10*3/uL (ref 0.0–0.5)
Eosinophils Relative: 0 %
HCT: 36.8 % — ABNORMAL LOW (ref 39.0–52.0)
Hemoglobin: 12.3 g/dL — ABNORMAL LOW (ref 13.0–17.0)
Immature Granulocytes: 1 %
Lymphocytes Relative: 12 %
Lymphs Abs: 0.7 10*3/uL (ref 0.7–4.0)
MCH: 29.9 pg (ref 26.0–34.0)
MCHC: 33.4 g/dL (ref 30.0–36.0)
MCV: 89.5 fL (ref 80.0–100.0)
Monocytes Absolute: 0.3 10*3/uL (ref 0.1–1.0)
Monocytes Relative: 4 %
Neutro Abs: 4.8 10*3/uL (ref 1.7–7.7)
Neutrophils Relative %: 83 %
Platelet Count: 185 10*3/uL (ref 150–400)
RBC: 4.11 MIL/uL — ABNORMAL LOW (ref 4.22–5.81)
RDW: 14.4 % (ref 11.5–15.5)
WBC Count: 5.8 10*3/uL (ref 4.0–10.5)
nRBC: 0 % (ref 0.0–0.2)

## 2021-01-29 LAB — TOTAL PROTEIN, URINE DIPSTICK: Protein, ur: <30 mg/dL — AB

## 2021-01-29 MED ORDER — SODIUM CHLORIDE 0.9 % IV SOLN
15.0000 mg/kg | Freq: Once | INTRAVENOUS | Status: AC
  Administered 2021-01-29: 1300 mg via INTRAVENOUS
  Filled 2021-01-29: qty 48

## 2021-01-29 MED ORDER — SODIUM CHLORIDE 0.9% FLUSH
10.0000 mL | Freq: Once | INTRAVENOUS | Status: AC
  Administered 2021-01-29: 10 mL

## 2021-01-29 MED ORDER — SODIUM CHLORIDE 0.9 % IV SOLN: Freq: Once | INTRAVENOUS | Status: AC

## 2021-01-29 MED ORDER — SODIUM CHLORIDE 0.9% FLUSH
10.0000 mL | INTRAVENOUS | Status: DC | PRN
  Administered 2021-01-29: 10 mL

## 2021-01-29 MED ORDER — HEPARIN SOD (PORK) LOCK FLUSH 100 UNIT/ML IV SOLN
500.0000 [IU] | Freq: Once | INTRAVENOUS | Status: AC | PRN
  Administered 2021-01-29: 500 [IU]

## 2021-01-29 NOTE — Patient Instructions (Signed)
[facility] ONCOLOGY   Discharge Instructions: Thank you for choosing [facility] to provide your oncology and hematology care.   If you have a lab appointment with the [facility], please go directly to the [facility] and check in at the registration area.   Wear comfortable clothing and clothing appropriate for easy access to any Portacath or PICC line.   We strive to give you quality time with your provider. You may need to reschedule your appointment if you arrive late (15 or more minutes).  Arriving late affects you and other patients whose appointments are after yours.  Also, if you miss three or more appointments without notifying the office, you may be dismissed from the clinic at the providers discretion.      For prescription refill requests, have your pharmacy contact our office and allow 72 hours for refills to be completed.    Today you received the following chemotherapy and/or immunotherapy agents: [id number].      To help prevent nausea and vomiting after your treatment, we encourage you to take your nausea medication as directed.  BELOW ARE SYMPTOMS THAT SHOULD BE REPORTED IMMEDIATELY: *FEVER GREATER THAN 100.4 F (38 C) OR HIGHER *CHILLS OR SWEATING *NAUSEA AND VOMITING THAT IS NOT CONTROLLED WITH YOUR NAUSEA MEDICATION *UNUSUAL SHORTNESS OF BREATH *UNUSUAL BRUISING OR BLEEDING *URINARY PROBLEMS (pain or burning when urinating, or frequent urination) *BOWEL PROBLEMS (unusual diarrhea, constipation, pain near the anus) TENDERNESS IN MOUTH AND THROAT WITH OR WITHOUT PRESENCE OF ULCERS (sore throat, sores in mouth, or a toothache) UNUSUAL RASH, SWELLING OR PAIN  UNUSUAL VAGINAL DISCHARGE OR ITCHING   Items with * indicate a potential emergency and should be followed up as soon as possible or go to the Emergency Department if any problems should occur.  Please show the CHEMOTHERAPY ALERT CARD or IMMUNOTHERAPY ALERT CARD at  check-in to the Emergency Department and triage nurse.  Should you have questions after your visit or need to cancel or reschedule your appointment, please contact [facility]  Dept: [phone]  and follow the prompts.  Office hours are 8:00 a.m. to 4:30 p.m. Monday - Friday. Please note that voicemails left after 4:00 p.m. may not be returned until the following business day.  We are closed weekends and major holidays. You have access to a nurse at all times for urgent questions. Please call the main number to the clinic Dept: [phone] and follow the prompts.   For any non-urgent questions, you may also contact your provider using MyChart. We now offer e-Visits for anyone 28 and older to request care online for non-urgent symptoms. For details visit [email or web address].   Also download the MyChart app! Go to the app store, search "MyChart", open the app, select [facility] [place], and log in with your MyChart username and password.  Due to Covid, a mask is required upon entering the hospital/clinic. If you do not have a mask, one will be given to you upon arrival. For doctor visits, patients may have 1 support person aged 67 or older with them. For treatment visits, patients cannot have anyone with them due to current Covid guidelines and our immunocompromised population.

## 2021-01-29 NOTE — Progress Notes (Signed)
[place] [phone]   [phone]) [phone]  OFFICE PROGRESS NOTE  [name], MD [place] [address]  DIAGNOSIS: Stage I/II malignant pleural mesothelioma involving the left hemithorax diagnosed in September 2021.  PRIOR THERAPY: None.  CURRENT THERAPY:  Systemic chemotherapy with carboplatin for AUC of 5, Alimta 500 mg/M2 and Avastin 15 mg/KG every 3 weeks.  Status post 20 cycles.  Starting from cycle #7 the patient will be on treatment with maintenance Avastin 15 mg/KG every 3 weeks.  INTERVAL HISTORY: Jack Haynes 80 y.o. male returns to the clinic today for follow-up visit.  The patient is feeling fine today with no concerning complaints except for increasing fatigue and shortness of breath recently.  He sleeps a lot at home.  He denied having any current chest pain, cough or hemoptysis.  He denied having any nausea, vomiting, diarrhea or constipation.  He has no headache or visual changes.  He has no fever or chills.  He lost few pounds since his last visit because of lack of appetite.  He is here today for evaluation before starting cycle #21.  MEDICAL HISTORY: Past Medical History:  Diagnosis Date   Arthritis    Cancer ([place])    hx of prostatae cancer   Dyspnea    until chest tube placed   Hypertension    Mesothelioma [facility])     ALLERGIES:  has No Known Allergies.  MEDICATIONS:  Current Outpatient Medications  Medication Sig Dispense Refill   acetaminophen (TYLENOL) 500 MG tablet Take 2 tablets (1,000 mg total) by mouth every 6 (six) hours as needed for mild pain or fever. (Patient taking differently: Take 500 mg by mouth every 6 (six) hours as needed for mild pain or fever.) 30 tablet 0   aspirin EC 81 MG tablet Take 81 mg by mouth daily. Swallow whole.     azithromycin (ZITHROMAX Z-PAK) 250 MG tablet Take two tablets on day 1, followed by 1 tablet per day the following days 6 each 0   calcium carbonate (TUMS -  DOSED IN MG ELEMENTAL CALCIUM) 500 MG chewable tablet Chew 1,000 mg by mouth daily as needed for indigestion or heartburn.     Cetirizine HCl (ZYRTEC ALLERGY) 10 MG CAPS Zyrtec 10 mg capsule  Take by oral route.     dexamethasone (DECADRON) 4 MG tablet TAKE 1 TABLET BY MOUTH TWICE DAILY THE DAY BEFORE, DAY OF AND DAY AFTER CHEMOTHERAPY EVERY 3 WEEKS 40 tablet 1   ferrous sulfate 325 (65 FE) MG tablet Take 325 mg by mouth daily with breakfast.     fluconazole (DIFLUCAN) 100 MG tablet Take 1 tablet (100 mg total) by mouth daily. 7 tablet 0   folic acid (FOLVITE) 1 MG tablet Take 1 mg by mouth daily.     lidocaine-prilocaine (EMLA) cream Apply to Port-A-Cath site 30-60-minute before treatment. 30 g 0   losartan-hydrochlorothiazide (HYZAAR) 100-12.5 MG tablet TAKE 1 TABLET BY MOUTH EVERY DAY 90 tablet 3   metFORMIN (GLUCOPHAGE) 500 MG tablet Take 1 tablet (500 mg total) by mouth 2 (two) times daily with a meal. 180 tablet 3   ondansetron (ZOFRAN ODT) 4 MG disintegrating tablet Take 1 tablet (4 mg total) by mouth every 8 (eight) hours as needed for nausea or vomiting. Starting 3 days after chemotherapy 30 tablet 1   prochlorperazine (COMPAZINE) 10 MG tablet TAKE 1 TABLET BY MOUTH EVERY 6 HOURS AS NEEDED FOR NAUSEA OR VOMITING. [place]  tablet 0   rosuvastatin (CRESTOR) 20 MG tablet TAKE 1 TABLET BY MOUTH EVERY DAY 90 tablet 3   simethicone (MYLICON) 852 MG chewable tablet Chew 125 mg by mouth daily as needed (gas).     No current facility-administered medications for this visit.    SURGICAL HISTORY:  Past Surgical History:  Procedure Laterality Date   APPENDECTOMY     BACK SURGERY  1998   disectomy   CHEST TUBE INSERTION Left 11/13/2019   Procedure: INSERTION PLEURAL DRAINAGE CATHETER;  Surgeon: Grace Isaac, MD;  Location: [place];  Service: Thoracic;  Laterality: Left;   EYE SURGERY Left 1965   injury   IR [place]  05/27/2020   JOINT REPLACEMENT Left 2010    left knee replacement   KNEE SURGERY     PLEURAL BIOPSY Left 11/13/2019   Procedure: PLEURAL BIOPSY;  Surgeon: Grace Isaac, MD;  Location: [place];  Service: Thoracic;  Laterality: Left;   PORTACATH PLACEMENT Left 11/26/2019   Procedure: INSERTION PORT-A-CATH BARD POWERPORT 9.6 FR CATHETER;  Surgeon: Grace Isaac, MD;  Location: [place];  Service: Thoracic;  Laterality: Left;   PROSTATE SURGERY     REMOVAL OF PLEURAL DRAINAGE CATHETER Left 11/26/2019   Procedure: REMOVAL OF PLEURAL DRAINAGE CATHETER;  Surgeon: Grace Isaac, MD;  Location: [place];  Service: Thoracic;  Laterality: Left;   SPINE SURGERY     TALC PLEURODESIS Left 11/13/2019   Procedure: possible TALC PLEURADESIS;  Surgeon: Grace Isaac, MD;  Location: [place];  Service: Thoracic;  Laterality: Left;   TOTAL KNEE ARTHROPLASTY  02/01/2012   Procedure: TOTAL KNEE ARTHROPLASTY;  Surgeon: Sydnee Cabal, MD;  Location: [facility];  Service: Orthopedics;  Laterality: Left;   VIDEO ASSISTED THORACOSCOPY Left 11/13/2019   Procedure: VIDEO ASSISTED THORACOSCOPY;  Surgeon: Grace Isaac, MD;  Location: [place];  Service: Thoracic;  Laterality: Left;   VIDEO BRONCHOSCOPY N/A 11/13/2019   Procedure: VIDEO BRONCHOSCOPY;  Surgeon: Grace Isaac, MD;  Location: [place];  Service: Thoracic;  Laterality: N/A;    REVIEW OF SYSTEMS:  A comprehensive review of systems was negative except for: Constitutional: positive for fatigue and weight loss Respiratory: positive for dyspnea on exertion   PHYSICAL EXAMINATION: General appearance: alert, cooperative, fatigued, and no distress Head: Normocephalic, without obvious abnormality, atraumatic Neck: no adenopathy, no JVD, supple, symmetrical, trachea midline, and thyroid not enlarged, symmetric, no tenderness/mass/nodules Lymph nodes: Cervical, supraclavicular, and axillary nodes normal. Resp: clear to auscultation bilaterally Back: symmetric, no curvature. ROM normal. No CVA  tenderness. Cardio: regular rate and rhythm, S1, S2 normal, no murmur, click, rub or gallop GI: soft, non-tender; bowel sounds normal; no masses,  no organomegaly Extremities: extremities normal, atraumatic, no cyanosis or edema  ECOG PERFORMANCE STATUS: 1 - Symptomatic but completely ambulatory  Blood pressure (!) 148/81, pulse 95, temperature (!) 97 F (36.1 C), temperature source Tympanic, resp. rate 18, height 5\' 7"  (1.702 m), weight 195 lb 9.6 oz (88.7 kg), SpO2 97 %.  LABORATORY DATA: Lab Results  Component Value Date   WBC 6.8 01/07/2021   HGB 12.2 (L) 01/07/2021   HCT 35.9 (L) 01/07/2021   MCV 88.9 01/07/2021   PLT 152 01/07/2021      Chemistry      Component Value Date/Time   NA 137 01/07/2021 0847   NA 141 09/24/2019 0832   K 4.1 01/07/2021 0847   CL 101 01/07/2021 0847   CO2 25 01/07/2021  0847   BUN 23 01/07/2021 0847   BUN 17 09/24/2019 0832   CREATININE 1.54 (H) 01/07/2021 0847   CREATININE 1.01 12/24/2014 0916      Component Value Date/Time   CALCIUM 8.8 (L) 01/07/2021 0847   ALKPHOS 61 01/07/2021 0847   AST 12 (L) 01/07/2021 0847   ALT 12 01/07/2021 0847   BILITOT 0.6 01/07/2021 0847       RADIOGRAPHIC STUDIES: No results found.   ASSESSMENT AND PLAN: This is a very pleasant 80 years old white male recently diagnosed with malignant pleural mesothelioma involving the left hemithorax. The patient is currently undergoing systemic chemotherapy with carboplatin for AUC of 5, Alimta 500 mg/M2 and Avastin 15 mg/KG every 3 weeks.  Status post 20 cycles.  Starting from cycle #7 the patient will be on maintenance treatment with Avastin 15 mg/KG every 3 weeks. The patient continues to tolerate this treatment well but recently has more fatigue and weakness as well as shortness of breath with exertion. I recommended for the patient to proceed with cycle #21 today as planned. I will arrange for him to have repeat CT scan of the chest before his upcoming visit in 3  weeks. For the fatigue, I recommended for the patient to starting more exercise and activity at regular basis. He was advised to call immediately if he has any other concerning symptoms in the interval.  The patient voices understanding of current disease status and treatment options and is in agreement with the current care plan.  All questions were answered. The patient knows to call the clinic with any problems, questions or concerns. We can certainly see the patient much sooner if necessary.   Disclaimer: This note was dictated with voice recognition software. Similar sounding words can inadvertently be transcribed and may not be corrected upon review.

## 2021-02-11 ENCOUNTER — Other Ambulatory Visit: Payer: Self-pay | Admitting: Internal Medicine

## 2021-02-13 ENCOUNTER — Other Ambulatory Visit: Payer: Self-pay

## 2021-02-13 ENCOUNTER — Encounter (HOSPITAL_COMMUNITY): Payer: Self-pay

## 2021-02-13 ENCOUNTER — Ambulatory Visit (HOSPITAL_COMMUNITY)
Admission: RE | Admit: 2021-02-13 | Discharge: 2021-02-13 | Disposition: A | Discharge status: Home / Self Care | Payer: PPO | Source: Ambulatory Visit | Attending: Internal Medicine | Admitting: Internal Medicine

## 2021-02-13 DIAGNOSIS — I700 Atherosclerosis of aorta: Secondary | ICD-10-CM | POA: Diagnosis not present

## 2021-02-13 DIAGNOSIS — C457 Mesothelioma of other sites: Secondary | ICD-10-CM | POA: Diagnosis not present

## 2021-02-13 MED ORDER — SODIUM CHLORIDE (PF) 0.9 % IJ SOLN
INTRAMUSCULAR | Status: AC
  Filled 2021-02-13: qty 50

## 2021-02-13 MED ORDER — IOHEXOL 350 MG/ML SOLN
60.0000 mL | Freq: Once | INTRAVENOUS | Status: AC | PRN
  Administered 2021-02-13: 12:00:00 60 mL via INTRAVENOUS

## 2021-02-18 NOTE — Progress Notes (Signed)
[facility] OFFICE PROGRESS NOTE  Jack Pollen, MD [place] [address]  DIAGNOSIS: Stage I/II malignant pleural mesothelioma involving the left hemithorax diagnosed in September 2021.  PRIOR THERAPY: None  CURRENT THERAPY: Systemic chemotherapy with carboplatin for AUC of 5, Alimta 500 mg/M2 and Avastin 15 mg/KG every 3 weeks.  Status post 21 cycles.  Starting from cycle #7 the patient will be on treatment with maintenance Avastin 15 mg/KG every 3 weeks  INTERVAL HISTORY: Jack Haynes 81 y.o. male returns to the clinic today for a follow-up visit.  The patient is feeling fair today.  He still struggles with fatigue.  His wife and him joined the [facility] and has been walking indoors recently 1 mile.  This has been helpful for him but he still has some good days and bad days with fatigue.  He reports that he felt fatigued on Christmas and slept most of the day.  He also reports taste alterations.  He does not have any evidence of thrush.  He is trying to use boost and Ensure.  He also reports an achy sensation in his left shoulder.  He cannot recall pulling or straining his muscle.  Denies any falls or injuries.  He reports normal range of motion.  He has been using a heating pad.  His pain is exacerbated by laying on his left shoulder.  He has not tried taking Tylenol because he is not like taking medications.  A few months ago when I saw the patient, he was endorsing an abnormal symptom of blowing his nose every morning and having a blue object come out.  He was given azithromycin which did help his symptoms temporarily.  More recently, the blue object/clot has returned.  He also reports nasal congestion without any improvement with Allegra, NyQuil, or Claritin.  He has some frontal sinus and maxillary pressure.    He denies any abnormal bleeding or bruising.  He denies any fevers. Denies any night sweats or weight loss.  He reports some occasional nausea for which  he is Zofran and Compazine.  He had 1 episode of vomiting.  Denies any unusual diarrhea or constipation.  Denies any headache or visual changes.  He is able to walk 1 mile around the track without significant shortness of breath.  Denies any cough, chest pain, or hemoptysis.  The patient states he previously saw a cardiologist for the chief complaint of palpitations.  He had an EKG and heart monitor performed.  He now follows with cardiology and an as-needed basis and has not needed to see them recently.  His wife has some questions regarding the cardiac findings on his recent restaging CT scan.  He recently had a restaging CT scan of chest, abdomen, and pelvis performed.  He is here today for evaluation to review his scan results before starting cycle #22 treatment.    MEDICAL HISTORY: Past Medical History:  Diagnosis Date   Arthritis    Cancer ([place])    hx of prostatae cancer   Dyspnea    until chest tube placed   Hypertension    Mesothelioma [facility])     ALLERGIES:  has No Known Allergies.  MEDICATIONS:  Current Outpatient Medications  Medication Sig Dispense Refill   acetaminophen (TYLENOL) 500 MG tablet Take 2 tablets (1,000 mg total) by mouth every 6 (six) hours as needed for mild pain or fever. (Patient taking differently: Take 500 mg by mouth every 6 (six) hours as needed for  mild pain or fever.) 30 tablet 0   aspirin EC 81 MG tablet Take 81 mg by mouth daily. Swallow whole.     azithromycin (ZITHROMAX) 250 MG tablet Take two tablets on day 1, followed by 1 tablet daily for 4 days 6 each 0   calcium carbonate (TUMS - DOSED IN MG ELEMENTAL CALCIUM) 500 MG chewable tablet Chew 1,000 mg by mouth daily as needed for indigestion or heartburn.     Cetirizine HCl (ZYRTEC ALLERGY) 10 MG CAPS Zyrtec 10 mg capsule  Take by oral route.     dexamethasone (DECADRON) 4 MG tablet TAKE 1 TABLET BY MOUTH TWICE DAILY THE DAY BEFORE, DAY OF AND DAY AFTER CHEMOTHERAPY EVERY 3 WEEKS 40 tablet 1    empagliflozin (JARDIANCE) 10 MG TABS tablet Take by mouth daily.     ferrous sulfate 325 (65 FE) MG tablet Take 325 mg by mouth daily with breakfast.     fluconazole (DIFLUCAN) 100 MG tablet Take 1 tablet (100 mg total) by mouth daily. 7 tablet 0   folic acid (FOLVITE) 1 MG tablet TAKE 1 TABLET BY MOUTH EVERY DAY 90 tablet 1   lidocaine-prilocaine (EMLA) cream Apply to Port-A-Cath site 30-60-minute before treatment. 30 g 0   losartan-hydrochlorothiazide (HYZAAR) 100-12.5 MG tablet TAKE 1 TABLET BY MOUTH EVERY DAY 90 tablet 3   metFORMIN (GLUCOPHAGE) 500 MG tablet Take 1 tablet (500 mg total) by mouth 2 (two) times daily with a meal. 180 tablet 3   ondansetron (ZOFRAN ODT) 4 MG disintegrating tablet Take 1 tablet (4 mg total) by mouth every 8 (eight) hours as needed for nausea or vomiting. Starting 3 days after chemotherapy 30 tablet 1   prochlorperazine (COMPAZINE) 10 MG tablet TAKE 1 TABLET BY MOUTH EVERY 6 HOURS AS NEEDED FOR NAUSEA OR VOMITING. 30 tablet 0   rosuvastatin (CRESTOR) 20 MG tablet TAKE 1 TABLET BY MOUTH EVERY DAY 90 tablet 3   simethicone (MYLICON) 903 MG chewable tablet Chew 125 mg by mouth daily as needed (gas).     No current facility-administered medications for this visit.   Facility-Administered Medications Ordered in Other Visits  Medication Dose Route Frequency Provider Last Rate Last Admin   bevacizumab-bvzr (ZIRABEV) 1,300 mg in sodium chloride 0.9 % 100 mL chemo infusion  15 mg/kg (Treatment Plan Recorded) Intravenous Once Curt Bears, MD        SURGICAL HISTORY:  Past Surgical History:  Procedure Laterality Date   APPENDECTOMY     BACK SURGERY  1998   disectomy   CHEST TUBE INSERTION Left 11/13/2019   Procedure: INSERTION PLEURAL DRAINAGE CATHETER;  Surgeon: Grace Isaac, MD;  Location: [facility];  Service: Thoracic;  Laterality: Left;   EYE SURGERY Left 1965   injury   IR [place]  05/27/2020   JOINT REPLACEMENT Left 2010    left knee replacement   KNEE SURGERY     PLEURAL BIOPSY Left 11/13/2019   Procedure: PLEURAL BIOPSY;  Surgeon: Grace Isaac, MD;  Location: [facility];  Service: Thoracic;  Laterality: Left;   PORTACATH PLACEMENT Left 11/26/2019   Procedure: INSERTION PORT-A-CATH BARD POWERPORT 9.6 FR CATHETER;  Surgeon: Grace Isaac, MD;  Location: [facility];  Service: Thoracic;  Laterality: Left;   PROSTATE SURGERY     REMOVAL OF PLEURAL DRAINAGE CATHETER Left 11/26/2019   Procedure: REMOVAL OF PLEURAL DRAINAGE CATHETER;  Surgeon: Grace Isaac, MD;  Location: [facility];  Service: Thoracic;  Laterality: Left;  SPINE SURGERY     TALC PLEURODESIS Left 11/13/2019   Procedure: possible TALC PLEURADESIS;  Surgeon: Grace Isaac, MD;  Location: [facility];  Service: Thoracic;  Laterality: Left;   TOTAL KNEE ARTHROPLASTY  02/01/2012   Procedure: TOTAL KNEE ARTHROPLASTY;  Surgeon: Sydnee Cabal, MD;  Location: [facility];  Service: Orthopedics;  Laterality: Left;   VIDEO ASSISTED THORACOSCOPY Left 11/13/2019   Procedure: VIDEO ASSISTED THORACOSCOPY;  Surgeon: Grace Isaac, MD;  Location: [facility];  Service: Thoracic;  Laterality: Left;   VIDEO BRONCHOSCOPY N/A 11/13/2019   Procedure: VIDEO BRONCHOSCOPY;  Surgeon: Grace Isaac, MD;  [facility];  Service: Thoracic;  Laterality: N/A;    REVIEW OF SYSTEMS:   Constitutional: Positive for decreased appetite and fatigue.  Negative for  chills, fever and unexpected weight change.  HENT: Positive for post nasal drainage. Negative for mouth sores, nosebleeds, sore throat and trouble swallowing.   Eyes: Negative for eye problems and icterus.  Respiratory: Negative for cough, shortness of breath, hemoptysis, and wheezing.   Cardiovascular: Negative for chest pain and leg swelling.  Gastrointestinal: Negative for abdominal pain, constipation, diarrhea, nausea and vomiting.  Genitourinary: Negative for bladder incontinence, difficulty urinating, dysuria,  frequency and hematuria.   Musculoskeletal: Positive for left shoulder pain.  Negative for back pain, gait problem, neck pain and neck stiffness.  Skin: Negative for itching and rash.  Neurological:  Negative for dizziness, extremity weakness, gait problem, light-headedness and seizures.  Hematological: Negative for adenopathy. Does not bruise/bleed easily.  Psychiatric/Behavioral: Negative for confusion, depression and sleep disturbance. The patient is not nervous/anxious.      PHYSICAL EXAMINATION:  Blood pressure 131/84, pulse 92, temperature 97.6 F (36.4 C), temperature source Tympanic, resp. rate 19, height 5\' 7"  (1.702 m), weight 198 lb 6.4 oz (90 kg), SpO2 97 %.  ECOG PERFORMANCE STATUS: 1  Physical Exam  Constitutional: Oriented to person, place, and time and well-developed, well-nourished, and in no distress.  HENT:  Head: Normocephalic and atraumatic.  Mouth/Throat: Oropharynx is clear and moist. No oropharyngeal exudate.  Eyes: Conjunctivae are normal. Right eye exhibits no discharge. Left eye exhibits no discharge. No scleral icterus.  Neck: Normal range of motion. Neck supple.  Cardiovascular: Normal rate, regular rhythm, normal heart sounds and intact distal pulses.   Pulmonary/Chest: Effort normal. Quiet breath sounds bilaterally. No respiratory distress. No wheezes. No rales.  Abdominal: Soft. Bowel sounds are normal. Exhibits no distension and no mass. There is no tenderness.  Musculoskeletal: Normal range of motion. Exhibits no edema.  Lymphadenopathy:    No cervical adenopathy.  Neurological: Alert and oriented to person, place, and time. Exhibits normal muscle tone. Gait normal. Coordination normal.  Skin: Skin is warm and dry. No rash noted. Not diaphoretic. No erythema. No pallor.  Psychiatric: Mood, memory and judgment normal.  Vitals reviewed.  LABORATORY DATA: Lab Results  Component Value Date   WBC 6.1 02/19/2021   HGB 12.4 (L) 02/19/2021   HCT 36.6 (L)  02/19/2021   MCV 88.8 02/19/2021   PLT 162 02/19/2021      Chemistry      Component Value Date/Time   NA 138 02/19/2021 0750   NA 141 09/24/2019 0832   K 3.9 02/19/2021 0750   CL 101 02/19/2021 0750   CO2 24 02/19/2021 0750   BUN 18 02/19/2021 0750   BUN 17 09/24/2019 0832   CREATININE 1.24 02/19/2021 0750   CREATININE 1.01 12/24/2014 0916      Component Value Date/Time  CALCIUM 9.0 02/19/2021 0750   ALKPHOS 55 02/19/2021 0750   AST 15 02/19/2021 0750   ALT 14 02/19/2021 0750   BILITOT 0.6 02/19/2021 0750       RADIOGRAPHIC STUDIES:  CT Chest W Contrast  Result Date: 02/15/2021 CLINICAL DATA:  81 year old male with history of mesothelioma undergoing ongoing chemotherapy. Evaluate for treatment response. EXAM: CT CHEST WITH CONTRAST TECHNIQUE: Multidetector CT imaging of the chest was performed during intravenous contrast administration. CONTRAST:  29mL OMNIPAQUE IOHEXOL 350 MG/ML SOLN COMPARISON:  Chest CT 12/16/2020. FINDINGS: Cardiovascular: Heart size is normal. There is some pericardial thickening and nodularity adjacent to the apex of the left ventricle, suggestive of potential pericardial involvement. No pericardial calcification. There is aortic atherosclerosis, as well as atherosclerosis of the great vessels of the mediastinum and the coronary arteries, including calcified atherosclerotic plaque in the left main, left anterior descending, left circumflex and right coronary arteries. Calcifications of the aortic valve. Left internal jugular single-lumen porta cath with tip terminating in the distal superior vena cava. Mediastinum/Nodes: No pathologically enlarged mediastinal or hilar lymph nodes. Esophagus is unremarkable in appearance. No axillary lymphadenopathy. Lungs/Pleura: Diffuse pleural thickening and enhancing nodularity, similar to the prior examination. The bulkiest area of pleural nodularity is in the medial aspect of the left hemithorax (axial image 109 of series  2) currently measures 3.3 x 2.2 cm, previously 2.7 x 2.2 cm). Additional area of nodularity in the anterior aspect of the left base (axial image 117 of series 2) measuring 3.6 x 2.7 cm (previously 2.6 x 2.0 cm). No pleural effusions. No pneumothorax. No acute consolidative airspace disease. Upper Abdomen: Aortic atherosclerosis. Diffuse low attenuation throughout the visualized hepatic parenchyma, indicative of a background of hepatic steatosis. Musculoskeletal: There are no aggressive appearing lytic or blastic lesions noted in the visualized portions of the skeleton. IMPRESSION: 1. Similar diffuse pleural nodularity and enhancement in the left hemithorax with slight enlargement of index areas of nodularity compared to the prior study, suggesting slight progression of disease. There is also some subtle pericardial nodularity concerning for pericardial involvement. 2. Aortic atherosclerosis, in addition to left main and three-vessel coronary artery disease. 3. There are calcifications of the aortic valve. Echocardiographic correlation for evaluation of potential valvular dysfunction may be warranted if clinically indicated. 4. Hepatic steatosis. Aortic Atherosclerosis (ICD10-I70.0). Electronically Signed   By: Vinnie Langton M.D.   On: 02/15/2021 15:35     ASSESSMENT/PLAN:  This is a very pleasant 81 year old Caucasian male diagnosed with malignant mesothelioma involving the left hemithorax.  The patient was diagnosed in September 2021.   The patient is currently undergoing systemic chemotherapy with carboplatin for AUC of 5, Alimta 500 mg/M2 and Avastin 15 mg/KG every 3 weeks.  Status post 21 cycles.  Starting from cycle #7 the patient will be on maintenance treatment with Avastin 15 mg/KG every 3 weeks. The patient has been tolerating this treatment well with no concerning adverse effects.  Patient recently had a restaging CT scan performed.  Dr. Julien Nordmann personally and independently reviewed the scan  discussed results with the patient today.  The scan showed some slight enlargement of the nodularity of the pleura compared to prior exam.  There is also some subtle pericardial nodularity concerning for pericardial involvement.  The scan also had some incidental findings of calcifications in the aortic arch.  Dr. Julien Nordmann recommends continuing on the same treatment with close monitoring at this point in time.  If he has disease progression in the future, Dr. Julien Nordmann discussed that  he would likely need to switch his treatment to immunotherapy.  We will see him back for follow-up visit in 3 weeks for evaluation before starting cycle #23.  Encourage the patient to follow-up with his cardiologist about their concerns with the calcifications in the aortic valve as well as a subtle pericardial nodularity.  Dr. Julien Nordmann is not sure of any intervention would be offered but it may be beneficial for the patient his wife to talk to cardiology further rationale and input.  They also talk to cardiology to see if the cardiac findings plays any role in his fatigue.  The patient's blood sugar is 291 today.  The patient reportedly had orange juice and pancake for breakfast.  His diabetes is managed by his primary care provider.  He also reports decreased appetite and taste alterations.  No evidence of thrush on exam today.  We will reach out to the patient's wife and encouraged her to help facilitate a follow-up for the patient and his primary care provider for diabetic education and management.  I will also place referral to nutritionist here at the cancer center about making good food choices with patients with diabetes.  He was instructed to monitor his blood sugar closely at home.  We will make sure the patient and his wife are aware that if he is drinking supplemental drinks to ensure that they are the diabetic kind.  For his shoulder pain, do not see any osseous lesions in the left shoulder.  Advised to use Tylenol if  needed.  For the unusual symptom of the blue object that comes out of his nose and symptoms of sinusitis, I sent a prescription for azithromycin to his pharmacy.  The patient was advised to call immediately if he has any concerning symptoms in the interval. The patient voices understanding of current disease status and treatment options and is in agreement with the current care plan. All questions were answered. The patient knows to call the clinic with any problems, questions or concerns. We can certainly see the patient much sooner if necessary     Orders Placed This Encounter  Procedures   Ambulatory Referral to [facility] Nutrition    Referral Priority:   Routine    Referral Type:   Consultation    Referral Reason:   Specialty Services Required    Number of Visits Requested:   [place], PA-C 02/19/21  ADDENDUM: Hematology/Oncology Attending: I had a face-to-face encounter with the patient today.  I reviewed his record, lab, scan and recommended his care plan.  This is a very pleasant 80 years old white male with history of malignant pleural mesothelioma involving the left hemithorax diagnosed in September 2021.  The patient is started systemic chemotherapy with carboplatin, Alimta and Avastin for 6 cycles followed by maintenance treatment of her Avastin every 3 weeks status post 15 more cycles. The patient has been tolerating his treatment well with no concerning adverse effect except for fatigue. He has been doing much better recently with exercise at the [facility].  He denied having any current chest pain but has shortness of breath with exertion with no cough or hemoptysis.  He denied having any recent weight loss or night sweats. He had repeat CT scan of the chest performed recently.  I personally and independently reviewed the scan images and discussed the results with the patient and his girlfriend. His scan showed no concerning findings for disease progression but  there was some slight increase  in soft tissue areas in the left hemothorax that need close monitoring on upcoming imaging studies. I recommended for the patient to proceed with cycle #22 today as planned. I will see him back for follow-up visit in 3 weeks for evaluation before the next cycle of his treatment. He was advised to call immediately if he has any other concerning symptoms in the interval. The total time spent in the appointment was 30 minutes. Disclaimer: This note was dictated with voice recognition software. Similar sounding words can inadvertently be transcribed and may be missed upon review. Eilleen Kempf, MD 02/19/21

## 2021-02-19 ENCOUNTER — Inpatient Hospital Stay: Payer: PPO | Attending: Internal Medicine | Admitting: Physician Assistant

## 2021-02-19 ENCOUNTER — Inpatient Hospital Stay: Payer: PPO

## 2021-02-19 ENCOUNTER — Other Ambulatory Visit: Payer: Self-pay

## 2021-02-19 ENCOUNTER — Telehealth: Payer: Self-pay | Admitting: Internal Medicine

## 2021-02-19 ENCOUNTER — Other Ambulatory Visit: Payer: PPO

## 2021-02-19 ENCOUNTER — Encounter: Payer: Self-pay | Admitting: Physician Assistant

## 2021-02-19 VITALS — BP 131/84 | HR 92 | Temp 97.6°F | Resp 19 | Ht 67.0 in | Wt 198.4 lb

## 2021-02-19 DIAGNOSIS — Z7984 Long term (current) use of oral hypoglycemic drugs: Secondary | ICD-10-CM | POA: Insufficient documentation

## 2021-02-19 DIAGNOSIS — I2510 Atherosclerotic heart disease of native coronary artery without angina pectoris: Secondary | ICD-10-CM | POA: Insufficient documentation

## 2021-02-19 DIAGNOSIS — I10 Essential (primary) hypertension: Secondary | ICD-10-CM | POA: Diagnosis not present

## 2021-02-19 DIAGNOSIS — Z5112 Encounter for antineoplastic immunotherapy: Secondary | ICD-10-CM | POA: Insufficient documentation

## 2021-02-19 DIAGNOSIS — M47814 Spondylosis without myelopathy or radiculopathy, thoracic region: Secondary | ICD-10-CM | POA: Diagnosis not present

## 2021-02-19 DIAGNOSIS — I700 Atherosclerosis of aorta: Secondary | ICD-10-CM | POA: Diagnosis not present

## 2021-02-19 DIAGNOSIS — Z9049 Acquired absence of other specified parts of digestive tract: Secondary | ICD-10-CM | POA: Diagnosis not present

## 2021-02-19 DIAGNOSIS — Z7952 Long term (current) use of systemic steroids: Secondary | ICD-10-CM | POA: Diagnosis not present

## 2021-02-19 DIAGNOSIS — J329 Chronic sinusitis, unspecified: Secondary | ICD-10-CM

## 2021-02-19 DIAGNOSIS — C450 Mesothelioma of pleura: Secondary | ICD-10-CM | POA: Diagnosis not present

## 2021-02-19 DIAGNOSIS — C457 Mesothelioma of other sites: Secondary | ICD-10-CM

## 2021-02-19 DIAGNOSIS — Z85831 Personal history of malignant neoplasm of soft tissue: Secondary | ICD-10-CM | POA: Diagnosis not present

## 2021-02-19 DIAGNOSIS — Z7982 Long term (current) use of aspirin: Secondary | ICD-10-CM | POA: Diagnosis not present

## 2021-02-19 DIAGNOSIS — Z79899 Other long term (current) drug therapy: Secondary | ICD-10-CM | POA: Diagnosis not present

## 2021-02-19 DIAGNOSIS — E119 Type 2 diabetes mellitus without complications: Secondary | ICD-10-CM | POA: Insufficient documentation

## 2021-02-19 DIAGNOSIS — R630 Anorexia: Secondary | ICD-10-CM

## 2021-02-19 DIAGNOSIS — R918 Other nonspecific abnormal finding of lung field: Secondary | ICD-10-CM | POA: Diagnosis not present

## 2021-02-19 DIAGNOSIS — Z95828 Presence of other vascular implants and grafts: Secondary | ICD-10-CM

## 2021-02-19 DIAGNOSIS — K760 Fatty (change of) liver, not elsewhere classified: Secondary | ICD-10-CM | POA: Diagnosis not present

## 2021-02-19 DIAGNOSIS — M25512 Pain in left shoulder: Secondary | ICD-10-CM | POA: Insufficient documentation

## 2021-02-19 DIAGNOSIS — Z5111 Encounter for antineoplastic chemotherapy: Secondary | ICD-10-CM

## 2021-02-19 LAB — CMP (CANCER CENTER ONLY)
ALT: 14 U/L (ref 0–44)
AST: 15 U/L (ref 15–41)
Albumin: 4.1 g/dL (ref 3.5–5.0)
Alkaline Phosphatase: 55 U/L (ref 38–126)
Anion gap: 13 (ref 5–15)
BUN: 18 mg/dL (ref 8–23)
CO2: 24 mmol/L (ref 22–32)
Calcium: 9 mg/dL (ref 8.9–10.3)
Chloride: 101 mmol/L (ref 98–111)
Creatinine: 1.24 mg/dL (ref 0.61–1.24)
GFR, Estimated: 59 mL/min — ABNORMAL LOW (ref >60)
Glucose, Bld: 291 mg/dL — ABNORMAL HIGH (ref 70–99)
Potassium: 3.9 mmol/L (ref 3.5–5.1)
Sodium: 138 mmol/L (ref 135–145)
Total Bilirubin: 0.6 mg/dL (ref 0.3–1.2)
Total Protein: 6.8 g/dL (ref 6.5–8.1)

## 2021-02-19 LAB — CBC WITH DIFFERENTIAL (CANCER CENTER ONLY)
Abs Immature Granulocytes: 0.03 10*3/uL (ref 0.00–0.07)
Basophils Absolute: 0 10*3/uL (ref 0.0–0.1)
Basophils Relative: 0 %
Eosinophils Absolute: 0 10*3/uL (ref 0.0–0.5)
Eosinophils Relative: 0 %
HCT: 36.6 % — ABNORMAL LOW (ref 39.0–52.0)
Hemoglobin: 12.4 g/dL — ABNORMAL LOW (ref 13.0–17.0)
Immature Granulocytes: 1 %
Lymphocytes Relative: 11 %
Lymphs Abs: 0.7 10*3/uL (ref 0.7–4.0)
MCH: 30.1 pg (ref 26.0–34.0)
MCHC: 33.9 g/dL (ref 30.0–36.0)
MCV: 88.8 fL (ref 80.0–100.0)
Monocytes Absolute: 0.3 10*3/uL (ref 0.1–1.0)
Monocytes Relative: 6 %
Neutro Abs: 5 10*3/uL (ref 1.7–7.7)
Neutrophils Relative %: 82 %
Platelet Count: 162 10*3/uL (ref 150–400)
RBC: 4.12 MIL/uL — ABNORMAL LOW (ref 4.22–5.81)
RDW: 14.4 % (ref 11.5–15.5)
WBC Count: 6.1 10*3/uL (ref 4.0–10.5)
nRBC: 0 % (ref 0.0–0.2)

## 2021-02-19 LAB — TOTAL PROTEIN, URINE DIPSTICK: Protein, ur: 30 mg/dL — AB

## 2021-02-19 MED ORDER — AZITHROMYCIN 250 MG PO TABS: ORAL_TABLET | ORAL | 0 refills | Status: DC

## 2021-02-19 MED ORDER — SODIUM CHLORIDE 0.9 % IV SOLN: Freq: Once | INTRAVENOUS | Status: AC

## 2021-02-19 MED ORDER — SODIUM CHLORIDE 0.9 % IV SOLN
15.0000 mg/kg | Freq: Once | INTRAVENOUS | Status: AC
  Administered 2021-02-19: 1300 mg via INTRAVENOUS
  Filled 2021-02-19: qty 48

## 2021-02-19 MED ORDER — SODIUM CHLORIDE 0.9% FLUSH
10.0000 mL | Freq: Once | INTRAVENOUS | Status: AC
  Administered 2021-02-19: 10 mL

## 2021-02-19 NOTE — Patient Instructions (Signed)
[facility] MEDICAL ONCOLOGY  Discharge Instructions: °Thank you for choosing [facility] to provide your oncology and hematology care.  ° °If you have a lab appointment with the Cancer Center, please go directly to the Cancer Center and check in at the registration area. °  °Wear comfortable clothing and clothing appropriate for easy access to any Portacath or PICC line.  ° °We strive to give you quality time with your provider. You may need to reschedule your appointment if you arrive late (15 or more minutes).  Arriving late affects you and other patients whose appointments are after yours.  Also, if you miss three or more appointments without notifying the office, you may be dismissed from the clinic at the provider’s discretion.    °  °For prescription refill requests, have your pharmacy contact our office and allow 72 hours for refills to be completed.   ° °Today you received the following chemotherapy and/or immunotherapy agents: bevacizumab    °  °To help prevent nausea and vomiting after your treatment, we encourage you to take your nausea medication as directed. ° °BELOW ARE SYMPTOMS THAT SHOULD BE REPORTED IMMEDIATELY: °*FEVER GREATER THAN 100.4 F (38 °C) OR HIGHER °*CHILLS OR SWEATING °*NAUSEA AND VOMITING THAT IS NOT CONTROLLED WITH YOUR NAUSEA MEDICATION °*UNUSUAL SHORTNESS OF BREATH °*UNUSUAL BRUISING OR BLEEDING °*URINARY PROBLEMS (pain or burning when urinating, or frequent urination) °*BOWEL PROBLEMS (unusual diarrhea, constipation, pain near the anus) °TENDERNESS IN MOUTH AND THROAT WITH OR WITHOUT PRESENCE OF ULCERS (sore throat, sores in mouth, or a toothache) °UNUSUAL RASH, SWELLING OR PAIN  °UNUSUAL VAGINAL DISCHARGE OR ITCHING  ° °Items with * indicate a potential emergency and should be followed up as soon as possible or go to the Emergency Department if any problems should occur. ° °Please show the CHEMOTHERAPY ALERT CARD or IMMUNOTHERAPY ALERT CARD at check-in  to the Emergency Department and triage nurse. ° °Should you have questions after your visit or need to cancel or reschedule your appointment, please contact [facility]ICAL [facility]COL[facility]  Dept: [phone]  and follow the prompts.  Office hours are 8:00 a.m. to 4:30 p.m. Monday - Friday. Please note that voicemails left after 4:00 p.m. may not be returned until the following business day.  We are closed weekends and major holidays. You have access to a nurse at all times for urgent questions. Please call the main number to the clinic Dept: [phone] and follow the prompts. ° ° °For any non-urgent questions, you may also contact your provider using MyChart. We now offer e-Visits for anyone 18 and older to request care online for non-urgent symptoms. For details visit [email or web address]. °  °Also download the MyChart app! Go to the app store, search "MyChart", open the app, select [place], and log in with your MyChart username and password. ° °Due to Covid, a mask is required upon entering the hospital/clinic. If you do not have a mask, one will be given to you upon arrival. For doctor visits, patients may have 1 support person aged 18 or older with them. For treatment visits, patients cannot have anyone with them due to current Covid guidelines and our immunocompromised population.  ° °

## 2021-02-19 NOTE — Telephone Encounter (Signed)
Scheduled per 01/05 scheduled message, patient has been called and notified.

## 2021-02-24 ENCOUNTER — Encounter: Payer: PPO | Admitting: Nutrition

## 2021-02-26 ENCOUNTER — Encounter: Payer: Self-pay | Admitting: Nurse Practitioner

## 2021-02-26 ENCOUNTER — Other Ambulatory Visit: Payer: Self-pay

## 2021-02-26 ENCOUNTER — Ambulatory Visit (INDEPENDENT_AMBULATORY_CARE_PROVIDER_SITE_OTHER): Payer: PPO | Admitting: Nurse Practitioner

## 2021-02-26 ENCOUNTER — Ambulatory Visit (INDEPENDENT_AMBULATORY_CARE_PROVIDER_SITE_OTHER): Payer: PPO

## 2021-02-26 VITALS — BP 106/72 | HR 97 | Temp 97.8°F | Ht 67.0 in | Wt 197.0 lb

## 2021-02-26 DIAGNOSIS — R109 Unspecified abdominal pain: Secondary | ICD-10-CM

## 2021-02-26 DIAGNOSIS — M25512 Pain in left shoulder: Secondary | ICD-10-CM

## 2021-02-26 DIAGNOSIS — M47814 Spondylosis without myelopathy or radiculopathy, thoracic region: Secondary | ICD-10-CM | POA: Diagnosis not present

## 2021-02-26 LAB — POCT URINALYSIS DIPSTICK
Bilirubin, UA: NEGATIVE
Blood, UA: NEGATIVE
Glucose, UA: POSITIVE — AB
Ketones, UA: NEGATIVE
Leukocytes, UA: NEGATIVE
Nitrite, UA: NEGATIVE
Protein, UA: POSITIVE — AB
Spec Grav, UA: 1.015 (ref 1.010–1.025)
Urobilinogen, UA: 1 E.U./dL
pH, UA: 6 (ref 5.0–8.0)

## 2021-02-26 MED ORDER — CYCLOBENZAPRINE HCL 5 MG PO TABS: 5.0000 mg | ORAL_TABLET | Freq: Three times a day (TID) | ORAL | 0 refills | Status: DC | PRN

## 2021-02-26 NOTE — Progress Notes (Signed)
Subjective:  Patient ID: Jack Haynes, male    DOB: 1940/08/31  Age: 81 y.o. MRN: [id number]  CC:  Chief Complaint  Patient presents with   Back Pain      HPI  This patient arrives today for the above.  Symptoms initially started a few weeks ago.  He is being having lateral back pain that radiates up to his left shoulder blade.  Denies any chest pain or shortness of breath.  Denies neck pain.  Sitting up improves the pain laying down seems to make it worse.  As far as he knows he has not had any traumatic event that elicited the pain.  He is also taken Tylenol as needed and this is helped as well.  He reports the pain as a constant aching and throbbing that waxes and wanes.  Today he reports that the intensity of 4 out of 10.  He denies any weakness or tingling to lower extremities.  Past Medical History:  Diagnosis Date   Arthritis    Cancer ([place])    hx of prostatae cancer   Dyspnea    until chest tube placed   Hypertension    Mesothelioma [facility])       Family History  Problem Relation Age of Onset   Cancer Sister        brain tumor   Heart disease Brother    Alzheimer's disease Brother    Heart disease Sister    Alzheimer's disease Father    Hypertension Sister    Colon cancer Neg Hx    Colon polyps Neg Hx    Esophageal cancer Neg Hx    Rectal cancer Neg Hx    Stomach cancer Neg Hx     Social History   Social History Narrative   Not on file   Social History   Tobacco Use   Smoking status: Former    Years: 15.00    Types: Cigarettes    Quit date: 02/15/1981    Years since quitting: 40.0   Smokeless tobacco: Former    Types: Chew    Quit date: 12/2018  Substance Use Topics   Alcohol use: Yes    Alcohol/week: 3.0 standard drinks    Types: 3 Shots of liquor per week     Current Meds  Medication Sig   acetaminophen (TYLENOL) 500 MG tablet Take 2 tablets (1,000 mg total) by mouth every 6 (six) hours as needed for mild pain or fever. (Patient  taking differently: Take 500 mg by mouth every 6 (six) hours as needed for mild pain or fever.)   calcium carbonate (TUMS - DOSED IN MG ELEMENTAL CALCIUM) 500 MG chewable tablet Chew 1,000 mg by mouth daily as needed for indigestion or heartburn.   Cetirizine HCl (ZYRTEC ALLERGY) 10 MG CAPS Zyrtec 10 mg capsule  Take by oral route.   cyclobenzaprine (FLEXERIL) 5 MG tablet Take 1 tablet (5 mg total) by mouth 3 (three) times daily as needed for muscle spasms.   dexamethasone (DECADRON) 4 MG tablet TAKE 1 TABLET BY MOUTH TWICE DAILY THE DAY BEFORE, DAY OF AND DAY AFTER CHEMOTHERAPY EVERY 3 WEEKS   empagliflozin (JARDIANCE) 10 MG TABS tablet Take by mouth daily.   ferrous sulfate 325 (65 FE) MG tablet Take 325 mg by mouth daily with breakfast.   folic acid (FOLVITE) 1 MG tablet TAKE 1 TABLET BY MOUTH EVERY DAY   lidocaine-prilocaine (EMLA) cream Apply to Port-A-Cath site 30-60-minute before treatment.   losartan-hydrochlorothiazide (  HYZAAR) 100-12.5 MG tablet TAKE 1 TABLET BY MOUTH EVERY DAY   metFORMIN (GLUCOPHAGE) 500 MG tablet Take 1 tablet (500 mg total) by mouth 2 (two) times daily with a meal.   ondansetron (ZOFRAN ODT) 4 MG disintegrating tablet Take 1 tablet (4 mg total) by mouth every 8 (eight) hours as needed for nausea or vomiting. Starting 3 days after chemotherapy   prochlorperazine (COMPAZINE) 10 MG tablet TAKE 1 TABLET BY MOUTH EVERY 6 HOURS AS NEEDED FOR NAUSEA OR VOMITING.   rosuvastatin (CRESTOR) 20 MG tablet TAKE 1 TABLET BY MOUTH EVERY DAY   simethicone (MYLICON) 258 MG chewable tablet Chew 125 mg by mouth daily as needed (gas).    ROS:  Review of Systems  Constitutional:  Negative for diaphoresis.  Respiratory:  Negative for shortness of breath.   Cardiovascular:  Negative for chest pain.  Gastrointestinal:  Negative for nausea.  Musculoskeletal:  Positive for back pain ((+) radiates to left shoulder). Negative for neck pain.  Neurological:  Negative for tingling, sensory  change and weakness.    Objective:   Today's Vitals: BP 106/72 (BP Location: Left Arm, Patient Position: Sitting, Cuff Size: Normal)    Pulse 97    Temp 97.8 F (36.6 C) (Oral)    Ht 5\' 7"  (1.702 m)    Wt 197 lb (89.4 kg)    SpO2 95%    BMI 30.85 kg/m  Vitals with BMI 02/26/2021 02/19/2021 01/29/2021  Height 5\' 7"  5\' 7"  5\' 7"   Weight 197 lbs 198 lbs 6 oz 195 lbs 10 oz  BMI 30.85 52.77 82.42  Systolic 353 614 431  Diastolic 72 84 81  Pulse 97 92 95     Physical Exam Vitals reviewed.  Constitutional:      Appearance: Normal appearance.  HENT:     Head: Normocephalic and atraumatic.  Cardiovascular:     Rate and Rhythm: Normal rate and regular rhythm.  Pulmonary:     Effort: Pulmonary effort is normal.     Breath sounds: Normal breath sounds.  Musculoskeletal:     Cervical back: Normal and neck supple.     Thoracic back: Normal.     Lumbar back: Normal.  Skin:    General: Skin is warm and dry.  Neurological:     Mental Status: He is alert and oriented to person, place, and time.     Cranial Nerves: Cranial nerves 2-12 are intact.     Sensory: Sensation is intact.     Motor: Motor function is intact.     Coordination: Coordination is intact.     Gait: Gait is intact.  Psychiatric:        Mood and Affect: Mood normal.        Behavior: Behavior normal.        Thought Content: Thought content normal.        Judgment: Judgment normal.       Assessment and Plan   1. Acute pain of left shoulder   2. Left flank pain      Plan: 1.,  2.  Point-of-care urinalysis did not show obvious signs of UTI.  EKG appears stable compared to EKG that he had completed approximately 1 year ago.  No significant ST segment abnormalities that would make me concerned for acute ischemia.  Not sure of current etiology of pain but I will order thoracic spine x-ray for further evaluation.  I will also prescribe Flexeril that he can take as needed in addition to Tylenol  as needed for pain  management.  Further recommendations may be made based upon x-ray results.  He does have an upcoming appoint with cardiology as recent CT scan of the chest showed atherosclerosis.   Tests ordered Orders Placed This Encounter  Procedures   DG Thoracic Spine 2 View   DG Thoracic Spine 2 View   POCT Urinalysis Dipstick   EKG 12-Lead      Meds ordered this encounter  Medications   cyclobenzaprine (FLEXERIL) 5 MG tablet    Sig: Take 1 tablet (5 mg total) by mouth 3 (three) times daily as needed for muscle spasms.    Dispense:  30 tablet    Refill:  0    Order Specific Question:   Supervising Provider    Answer:   Binnie Rail [id number]    Patient to follow-up in 1-2 weeks for close follow-up. Ailene Ards, NP

## 2021-03-03 ENCOUNTER — Other Ambulatory Visit: Payer: Self-pay

## 2021-03-03 ENCOUNTER — Inpatient Hospital Stay: Payer: PPO | Admitting: Dietician

## 2021-03-03 NOTE — Progress Notes (Signed)
Nutrition Follow-up: ° °Patient with stage I/II malignant pleural mesothelioma involving left hemithorax. He is currently receiving systemic chemotherapy with carboplatin for AUC of 5, Alimta 500 mg/M2 and Avastin 15 mg/KG q3 weeks. S/p 21 cycles. Starting from cycle 7 patient receiving maintenance Avastin q3 weeks.  ° °Met with patient and his wife in office. Patient reports improved fatigue. He and his wife walk ~1 mile/day at the local [employer]. Patient is having taste alterations. Foods he typically eats do not taste good to him. He finds foods bland and sometimes metallic. Wife reports he has been eating a lot of cottage cheese and peaches. Patient likes the sweet taste of fruits. He drinks Ensure mixed with ice cream occasionally. Patient does not eat a lot of meat. He likes chicken livers and grilled steak taste good to him. Patient likes ice cold water, he drinks small bottles that he can chill in the freezer (~64 oz/day). He is also drinking body armor, gatorade, juice. Patient reports elevated blood sugars due to decadron. Wife reports they have stopped checking a few days before treatment when starting medication.  ° ° °Medications: flexeril, jardiance, folvite, ferrous sulfate, decadron, compazine, metformin, zofran ° °Labs: 1/05 glucose 291 ° °Anthropometrics:  ° °Height: 5'7" °Weight: 197 lb  °UBW: 208 lb (09/22 per chart) °BMI: 30.85 ° °NUTRITION DIAGNOSIS: Food and nutrition related knowledge deficit related to cancer and associated treatments as evidenced by no prior need for nutrition related information  ° ° ° °INTERVENTION:  °Educated on importance of adequate calories and protein to maintain weights/strength  °Discussed strategies for improving glucose management - handout and balanced snack ideas provided. Discussed protein foods and encouraged protein source with all meals and snacks.  °Discussed strategies for altered taste - handout with tips provided °Suggested pt try baking soda salt water  rinses several times day - recipe provided °Continue activity as able  °Contact information provided °  ° °MONITORING, EVALUATION, GOAL: Patient will tolerate increased calories and protein to minimize weight loss  ° ° °NEXT VISIT: Thursday March 9 during infusion  ° ° °

## 2021-03-06 NOTE — Progress Notes (Addendum)
Cardiology Office Note:   Date:  03/10/2021  NAME:  Jack Haynes    MRN: [id number] DOB:  [birth year]   PCP:  [name], MD  Cardiologist:  None  Electrophysiologist:  None   Referring MD: [name], *   Chief Complaint  Patient presents with   Follow-up        History of Present Illness:   Jack Haynes is a 81 y.o. male with a hx of stage III mesothelioma, diabetes, hypertension, coronary calcifications, aortic atherosclerosis who presents for follow-up.  Evaluated last year for tachycardia.  Monitor showed sinus tachycardia.  This occurred after starting chemotherapy.  Found to have coronary calcifications on chest CT.  Started on statin therapy.  Follow-up today.  Recently had CT imaging due to his mesothelioma.  There was mention of coronary calcifications which are not new.  There was also mention of aortic valve calcium.  No new murmur on exam.  He had no evidence of aortic stenosis on his echocardiogram in 2021.  He reports some shortness of breath but I suspect this is expected.  He denies any significant chest pains.  He does report some tightness in his back and neck.  This is alleviated by Flexeril.  He does receive steroids as part of his chemotherapy.  His A1c is 8.4.  Most recent LDL cholesterol was 78.  He was started on Crestor by me at his last visit.  Pulse is regular on exam today.  Cardiovascular examination is normal.  Denies any major cardiovascular symptoms.  CT scans reviewed in office with the patient and his wife.  Problem List 1. Stage I/II malignant pleural mesothelioma L hemithorax -currently on chemo 2. Diabetes -A1c 8.4 3. HLD -T chol 151, HDL 44, LDL 78, TG 142 4. HTN 5. RBBB 6. Sinus tachycardia (average HR 98 bpm) 7. Coronary calcifications/aortic atherosclerosis on CT (05/19/2020)  Past Medical History: Past Medical History:  Diagnosis Date   Arthritis    Cancer ([place])    hx of prostatae cancer   Dyspnea    until  chest tube placed   Hypertension    Mesothelioma [facility])     Past Surgical History: Past Surgical History:  Procedure Laterality Date   APPENDECTOMY     BACK SURGERY  1998   disectomy   CHEST TUBE INSERTION Left 11/13/2019   Procedure: INSERTION PLEURAL DRAINAGE CATHETER;  Surgeon: Grace Isaac, MD;  Location: [place];  Service: Thoracic;  Laterality: Left;   EYE SURGERY Left 1965   injury   IR [place]  05/27/2020   JOINT REPLACEMENT Left 2010   left knee replacement   KNEE SURGERY     PLEURAL BIOPSY Left 11/13/2019   Procedure: PLEURAL BIOPSY;  Surgeon: Grace Isaac, MD;  Location: [facility];  Service: Thoracic;  Laterality: Left;   PORTACATH PLACEMENT Left 11/26/2019   Procedure: INSERTION PORT-A-CATH BARD POWERPORT 9.6 FR CATHETER;  Surgeon: Grace Isaac, MD;  Location: [place];  Service: Thoracic;  Laterality: Left;   PROSTATE SURGERY     REMOVAL OF PLEURAL DRAINAGE CATHETER Left 11/26/2019   Procedure: REMOVAL OF PLEURAL DRAINAGE CATHETER;  Surgeon: Grace Isaac, MD;  Location: [place];  Service: Thoracic;  Laterality: Left;   SPINE SURGERY     TALC PLEURODESIS Left 11/13/2019   Procedure: possible TALC PLEURADESIS;  Surgeon: Grace Isaac, MD;  Location: [place];  Service: Thoracic;  Laterality: Left;   TOTAL  KNEE ARTHROPLASTY  02/01/2012   Procedure: TOTAL KNEE ARTHROPLASTY;  Surgeon: Sydnee Cabal, MD;  Location: [facility];  Service: Orthopedics;  Laterality: Left;   VIDEO ASSISTED THORACOSCOPY Left 11/13/2019   Procedure: VIDEO ASSISTED THORACOSCOPY;  Surgeon: Grace Isaac, MD;  Location: [place];  Service: Thoracic;  Laterality: Left;   VIDEO BRONCHOSCOPY N/A 11/13/2019   Procedure: VIDEO BRONCHOSCOPY;  Surgeon: Grace Isaac, MD;  Location: [facility];  Service: Thoracic;  Laterality: N/A;    Current Medications: Current Meds  Medication Sig   acetaminophen (TYLENOL) 500 MG tablet Take 2 tablets (1,000 mg total) by mouth  every 6 (six) hours as needed for mild pain or fever. (Patient taking differently: Take 500 mg by mouth every 6 (six) hours as needed for mild pain or fever.)   aspirin EC 81 MG tablet Take 81 mg by mouth daily. Swallow whole.   calcium carbonate (TUMS - DOSED IN MG ELEMENTAL CALCIUM) 500 MG chewable tablet Chew 1,000 mg by mouth daily as needed for indigestion or heartburn.   Cetirizine HCl (ZYRTEC ALLERGY) 10 MG CAPS Zyrtec 10 mg capsule  Take by oral route.   cyclobenzaprine (FLEXERIL) 5 MG tablet Take 1 tablet (5 mg total) by mouth 3 (three) times daily as needed for muscle spasms.   dexamethasone (DECADRON) 4 MG tablet TAKE 1 TABLET BY MOUTH TWICE DAILY THE DAY BEFORE, DAY OF AND DAY AFTER CHEMOTHERAPY EVERY 3 WEEKS   empagliflozin (JARDIANCE) 10 MG TABS tablet Take by mouth daily.   ferrous sulfate 325 (65 FE) MG tablet Take 325 mg by mouth daily with breakfast.   fluconazole (DIFLUCAN) 100 MG tablet Take 1 tablet (100 mg total) by mouth daily.   folic acid (FOLVITE) 1 MG tablet TAKE 1 TABLET BY MOUTH EVERY DAY   lidocaine-prilocaine (EMLA) cream Apply to Port-A-Cath site 30-60-minute before treatment.   losartan-hydrochlorothiazide (HYZAAR) 100-12.5 MG tablet TAKE 1 TABLET BY MOUTH EVERY DAY   metFORMIN (GLUCOPHAGE) 500 MG tablet Take 1 tablet (500 mg total) by mouth 2 (two) times daily with a meal.   mometasone (ELOCON) 0.1 % cream Apply topically 2 (two) times daily.   ondansetron (ZOFRAN ODT) 4 MG disintegrating tablet Take 1 tablet (4 mg total) by mouth every 8 (eight) hours as needed for nausea or vomiting. Starting 3 days after chemotherapy   prochlorperazine (COMPAZINE) 10 MG tablet TAKE 1 TABLET BY MOUTH EVERY 6 HOURS AS NEEDED FOR NAUSEA OR VOMITING.   rosuvastatin (CRESTOR) 20 MG tablet TAKE 1 TABLET BY MOUTH EVERY DAY   simethicone (MYLICON) 035 MG chewable tablet Chew 125 mg by mouth daily as needed (gas).     Allergies:    Patient has no known allergies.   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   Tobacco Use   Smoking status: Former    Years: 15.00    Types: Cigarettes    Quit date: 02/15/1981    Years since quitting: 40.0   Smokeless tobacco: Former    Types: Chew    Quit date: 12/2018  Vaping Use   Vaping Use: Never used  Substance and Sexual Activity   Alcohol use: Yes    Alcohol/week: 3.0 standard drinks  Types: 3 Shots of liquor per week   Drug use: No   Sexual activity: Not on file  Other Topics Concern   Not on file  Social History Narrative   Not on file   Social Determinants of Health   Financial Resource Strain: Not on file  Food Insecurity: Not on file  Transportation Needs: Not on file  Physical Activity: Not on file  Stress: Not on file  Social Connections: Not on file     Family History: The patient's family history includes Alzheimer's disease in his brother and father; Cancer in his sister; Heart disease in his brother and sister; Hypertension in his sister. There is no history of Colon cancer, Colon polyps, Esophageal cancer, Rectal cancer, or Stomach cancer.  ROS:   All other ROS reviewed and negative. Pertinent positives noted in the HPI.     EKGs/Labs/Other Studies Reviewed:   The following studies were personally reviewed by me today:  [facility] 04/23/2020 Impression: 1. Brief ectopic atrial tachycardia episodes detected (3 in 7 days; longest 5.3 seconds).  2. Rare ectopy.   TTE 10/30/2019  1. Left ventricular ejection fraction, by estimation, is 60 to 65%. The  left ventricle has normal function. The left ventricle has no regional  wall motion abnormalities. There is mild concentric left ventricular  hypertrophy. Left ventricular diastolic  parameters are consistent  with Grade I diastolic dysfunction (impaired  relaxation).   2. Right ventricular systolic function was not well visualized. The right  ventricular size is not well visualized. Tricuspid regurgitation signal is  inadequate for assessing PA pressure.   3. Large pleural effusion in the left lateral region.   4. The mitral valve is normal in structure. No evidence of mitral valve  regurgitation. No evidence of mitral stenosis.   5. The aortic valve was not well visualized. Aortic valve regurgitation  is not visualized.   6. The inferior vena cava is normal in size with greater than 50%  respiratory variability, suggesting right atrial pressure of 3 mmHg.   Recent Labs: 03/24/2020: TSH 7.480 02/19/2021: ALT 14; BUN 18; Creatinine 1.24; Hemoglobin 12.4; Platelet Count 162; Potassium 3.9; Sodium 138   Recent Lipid Panel    Component Value Date/Time   CHOL 151 10/15/2020 1447   CHOL 184 09/24/2019 0832   TRIG 142.0 10/15/2020 1447   HDL 44.20 10/15/2020 1447   HDL 40 09/24/2019 0832   CHOLHDL 3 10/15/2020 1447   VLDL 28.4 10/15/2020 1447   LDLCALC 78 10/15/2020 1447   LDLCALC 127 (H) 09/24/2019 0832    Physical Exam:   VS:  BP 110/70 (BP Location: Left Arm, Patient Position: Sitting, Cuff Size: Normal)    Pulse (!) 104    Ht 5\' 7"  (1.702 m)    Wt 195 lb 1.6 oz (88.5 kg)    SpO2 95%    BMI 30.56 kg/m    Wt Readings from Last 3 Encounters:  03/10/21 195 lb 1.6 oz (88.5 kg)  02/26/21 197 lb (89.4 kg)  02/19/21 198 lb 6.4 oz (90 kg)    General: Well nourished, well developed, in no acute distress Head: Atraumatic, normal size  Eyes: PEERLA, EOMI  Neck: Supple, no JVD Endocrine: No thryomegaly Cardiac: Normal S1, S2; RRR; no murmurs, rubs, or gallops Lungs: Clear to auscultation bilaterally, no wheezing, rhonchi or rales  Abd: Soft, nontender, no hepatomegaly  Ext: No edema, pulses 2+ Musculoskeletal: No deformities, BUE and BLE strength normal and equal Skin: Warm and dry, no rashes  Neuro: Alert and oriented to person, place, time, and situation, CNII-XII grossly intact, no focal deficits  Psych: Normal mood and affect   ASSESSMENT:   Jack Haynes is a 81 y.o. male who presents for the following: 1. Coronary artery calcification seen on CAT scan   2. Aortic atherosclerosis ([place])   3. Mixed hyperlipidemia   4. RBBB     PLAN:   1. Coronary artery calcification seen on CAT scan 2. Aortic atherosclerosis ([place]) 3. Mixed hyperlipidemia -Diagnosed with mesothelioma and recurrent left malignant pleural effusion.  He is currently on chemotherapy which will continue indefinitely per his report.  He is well known to have coronary calcifications as well as aortic and aortic valve calcifications on CT scan.  Recent echo shows normal LV function.  He describes no symptoms concerning for angina.  He does report some neck and back tightness.  This is alleviated by Flexeril.  I believe this is noncardiac.  I have reviewed his CT scans.  There really is nothing new here.  I believe the report was just more detailed.  There is also no definitive evidence of pericardial involvement of his mesothelioma.  For now we will just recommend watchful waiting.  He will continue aspirin 81 mg daily.  He is on Crestor 20 mg daily.  LDL cholesterol is 78.  This is close enough to goal.  We discussed possibly pursuing a stress test just to make sure this back pain is not cardiac but he reports he has been going to the doctor so much he would like to hold on this if it is not necessary.  I think for now we can just watch.  Nothing sounds alarming to me.  4. RBBB -normal EF. No treatment needed.    Disposition: Return in about 1 year (around 03/10/2022).  Medication Adjustments/Labs and Tests Ordered: Current medicines are reviewed at length with the patient today.  Concerns regarding medicines are outlined above.  No orders of the defined types were placed in this encounter.  No orders of the  defined types were placed in this encounter.   Patient Instructions  Medication Instructions:  The current medical regimen is effective;  continue present plan and medications.  *If you need a refill on your cardiac medications before your next appointment, please call your pharmacy*   Follow-Up: At [facility], you and your health needs are our priority.  As part of our continuing mission to provide you with exceptional heart care, we have created designated Provider Care Teams.  These Care Teams include your primary Cardiologist (physician) and Advanced Practice Providers (APPs -  Physician Assistants and Nurse Practitioners) who all work together to provide you with the care you need, when you need it.  We recommend signing up for the patient portal called "MyChart".  Sign up information is provided on this After Visit Summary.  MyChart is used to connect with patients for Virtual Visits (Telemedicine).  Patients are able to view lab/test results, encounter notes, upcoming appointments, etc.  Non-urgent messages can be sent to your provider as well.   To learn more about what you can do with MyChart, go to [email or web address].    Your next appointment:   12 month(s)  The format for your next appointment:   In Person  Provider:   Eleonore Chiquito, MD     Signed, Addison Naegeli. Audie Box, MD, [place]  [address] [place], [place] [address] [phone]  03/10/2021  9:45 AM

## 2021-03-09 ENCOUNTER — Other Ambulatory Visit: Payer: Self-pay | Admitting: Internal Medicine

## 2021-03-10 ENCOUNTER — Ambulatory Visit (HOSPITAL_BASED_OUTPATIENT_CLINIC_OR_DEPARTMENT_OTHER): Payer: PPO | Admitting: Cardiovascular Disease

## 2021-03-10 ENCOUNTER — Encounter (HOSPITAL_BASED_OUTPATIENT_CLINIC_OR_DEPARTMENT_OTHER): Payer: Self-pay | Admitting: Cardiovascular Disease

## 2021-03-10 ENCOUNTER — Other Ambulatory Visit: Payer: Self-pay

## 2021-03-10 VITALS — BP 110/70 | HR 104 | Ht 67.0 in | Wt 195.1 lb

## 2021-03-10 DIAGNOSIS — I700 Atherosclerosis of aorta: Secondary | ICD-10-CM

## 2021-03-10 DIAGNOSIS — I4510 Unspecified right bundle-branch block: Secondary | ICD-10-CM | POA: Diagnosis not present

## 2021-03-10 DIAGNOSIS — I2510 Atherosclerotic heart disease of native coronary artery without angina pectoris: Secondary | ICD-10-CM | POA: Diagnosis not present

## 2021-03-10 DIAGNOSIS — E782 Mixed hyperlipidemia: Secondary | ICD-10-CM

## 2021-03-10 NOTE — Patient Instructions (Signed)

## 2021-03-11 ENCOUNTER — Other Ambulatory Visit: Payer: Self-pay | Admitting: Emergency Medicine

## 2021-03-11 DIAGNOSIS — E119 Type 2 diabetes mellitus without complications: Secondary | ICD-10-CM

## 2021-03-12 ENCOUNTER — Inpatient Hospital Stay: Payer: PPO

## 2021-03-12 ENCOUNTER — Other Ambulatory Visit: Payer: Self-pay

## 2021-03-12 ENCOUNTER — Ambulatory Visit (INDEPENDENT_AMBULATORY_CARE_PROVIDER_SITE_OTHER): Payer: PPO | Admitting: Nurse Practitioner

## 2021-03-12 ENCOUNTER — Other Ambulatory Visit: Payer: Self-pay | Admitting: Internal Medicine

## 2021-03-12 ENCOUNTER — Inpatient Hospital Stay: Payer: PPO | Admitting: Internal Medicine

## 2021-03-12 ENCOUNTER — Encounter: Payer: Self-pay | Admitting: Nurse Practitioner

## 2021-03-12 ENCOUNTER — Other Ambulatory Visit: Payer: PPO

## 2021-03-12 VITALS — BP 143/87 | HR 95 | Temp 96.9°F | Resp 19 | Ht 67.0 in | Wt 194.8 lb

## 2021-03-12 VITALS — BP 118/74 | HR 95 | Temp 98.4°F | Ht 67.0 in | Wt 196.0 lb

## 2021-03-12 DIAGNOSIS — R109 Unspecified abdominal pain: Secondary | ICD-10-CM

## 2021-03-12 DIAGNOSIS — C457 Mesothelioma of other sites: Secondary | ICD-10-CM

## 2021-03-12 DIAGNOSIS — Z95828 Presence of other vascular implants and grafts: Secondary | ICD-10-CM

## 2021-03-12 DIAGNOSIS — M4810 Ankylosing hyperostosis [Forestier], site unspecified: Secondary | ICD-10-CM | POA: Insufficient documentation

## 2021-03-12 DIAGNOSIS — Z5112 Encounter for antineoplastic immunotherapy: Secondary | ICD-10-CM | POA: Diagnosis not present

## 2021-03-12 DIAGNOSIS — Z5111 Encounter for antineoplastic chemotherapy: Secondary | ICD-10-CM

## 2021-03-12 DIAGNOSIS — M25512 Pain in left shoulder: Secondary | ICD-10-CM | POA: Diagnosis not present

## 2021-03-12 LAB — CBC WITH DIFFERENTIAL (CANCER CENTER ONLY)
Abs Immature Granulocytes: 0.03 10*3/uL (ref 0.00–0.07)
Basophils Absolute: 0 10*3/uL (ref 0.0–0.1)
Basophils Relative: 0 %
Eosinophils Absolute: 0 10*3/uL (ref 0.0–0.5)
Eosinophils Relative: 0 %
HCT: 38.4 % — ABNORMAL LOW (ref 39.0–52.0)
Hemoglobin: 12.8 g/dL — ABNORMAL LOW (ref 13.0–17.0)
Immature Granulocytes: 0 %
Lymphocytes Relative: 9 %
Lymphs Abs: 0.6 10*3/uL — ABNORMAL LOW (ref 0.7–4.0)
MCH: 29.6 pg (ref 26.0–34.0)
MCHC: 33.3 g/dL (ref 30.0–36.0)
MCV: 88.7 fL (ref 80.0–100.0)
Monocytes Absolute: 0.4 10*3/uL (ref 0.1–1.0)
Monocytes Relative: 5 %
Neutro Abs: 5.9 10*3/uL (ref 1.7–7.7)
Neutrophils Relative %: 86 %
Platelet Count: 184 10*3/uL (ref 150–400)
RBC: 4.33 MIL/uL (ref 4.22–5.81)
RDW: 14 % (ref 11.5–15.5)
WBC Count: 6.9 10*3/uL (ref 4.0–10.5)
nRBC: 0 % (ref 0.0–0.2)

## 2021-03-12 LAB — CMP (CANCER CENTER ONLY)
ALT: 10 U/L (ref 0–44)
AST: 17 U/L (ref 15–41)
Albumin: 4.2 g/dL (ref 3.5–5.0)
Alkaline Phosphatase: 48 U/L (ref 38–126)
Anion gap: 10 (ref 5–15)
BUN: 19 mg/dL (ref 8–23)
CO2: 26 mmol/L (ref 22–32)
Calcium: 9.3 mg/dL (ref 8.9–10.3)
Chloride: 102 mmol/L (ref 98–111)
Creatinine: 1.19 mg/dL (ref 0.61–1.24)
GFR, Estimated: >60 mL/min (ref >60)
Glucose, Bld: 207 mg/dL — ABNORMAL HIGH (ref 70–99)
Potassium: 4.4 mmol/L (ref 3.5–5.1)
Sodium: 138 mmol/L (ref 135–145)
Total Bilirubin: 0.5 mg/dL (ref 0.3–1.2)
Total Protein: 7.1 g/dL (ref 6.5–8.1)

## 2021-03-12 LAB — TOTAL PROTEIN, URINE DIPSTICK: Protein, ur: 100 mg/dL — AB

## 2021-03-12 MED ORDER — CYCLOBENZAPRINE HCL 5 MG PO TABS: 5.0000 mg | ORAL_TABLET | Freq: Three times a day (TID) | ORAL | 2 refills | Status: DC | PRN

## 2021-03-12 MED ORDER — SODIUM CHLORIDE 0.9% FLUSH
10.0000 mL | INTRAVENOUS | Status: DC | PRN
  Administered 2021-03-12: 10 mL

## 2021-03-12 MED ORDER — SODIUM CHLORIDE 0.9 % IV SOLN
15.0000 mg/kg | Freq: Once | INTRAVENOUS | Status: AC
  Administered 2021-03-12: 1300 mg via INTRAVENOUS
  Filled 2021-03-12: qty 48

## 2021-03-12 MED ORDER — SODIUM CHLORIDE 0.9 % IV SOLN: Freq: Once | INTRAVENOUS | Status: AC

## 2021-03-12 MED ORDER — HEPARIN SOD (PORK) LOCK FLUSH 100 UNIT/ML IV SOLN
500.0000 [IU] | Freq: Once | INTRAVENOUS | Status: AC | PRN
  Administered 2021-03-12: 500 [IU]

## 2021-03-12 MED ORDER — SODIUM CHLORIDE 0.9% FLUSH
10.0000 mL | Freq: Once | INTRAVENOUS | Status: AC
  Administered 2021-03-12: 10 mL

## 2021-03-12 NOTE — Patient Instructions (Signed)
[facility]  Discharge Instructions: °Thank you for choosing [facility] to provide your oncology and hematology care.  ° °If you have a lab appointment with the Cancer Center, please go directly to the Cancer Center and check in at the registration area. °  °Wear comfortable clothing and clothing appropriate for easy access to any Portacath or PICC line.  ° °We strive to give you quality time with your provider. You may need to reschedule your appointment if you arrive late (15 or more minutes).  Arriving late affects you and other patients whose appointments are after yours.  Also, if you miss three or more appointments without notifying the office, you may be dismissed from the clinic at the provider’s discretion.    °  °For prescription refill requests, have your pharmacy contact our office and allow 72 hours for refills to be completed.   ° °Today you received the following chemotherapy and/or immunotherapy agents: bevacizumab    °  °To help prevent nausea and vomiting after your treatment, we encourage you to take your nausea medication as directed. ° °BELOW ARE SYMPTOMS THAT SHOULD BE REPORTED IMMEDIATELY: °*FEVER GREATER THAN 100.4 F (38 °C) OR HIGHER °*CHILLS OR SWEATING °*NAUSEA AND VOMITING THAT IS NOT CONTROLLED WITH YOUR NAUSEA MEDICATION °*UNUSUAL SHORTNESS OF BREATH °*UNUSUAL BRUISING OR BLEEDING °*URINARY PROBLEMS (pain or burning when urinating, or frequent urination) °*BOWEL PROBLEMS (unusual diarrhea, constipation, pain near the anus) °TENDERNESS IN MOUTH AND THROAT WITH OR WITHOUT PRESENCE OF ULCERS (sore throat, sores in mouth, or a toothache) °UNUSUAL RASH, SWELLING OR PAIN  °UNUSUAL VAGINAL DISCHARGE OR ITCHING  ° °Items with * indicate a potential emergency and should be followed up as soon as possible or go to the Emergency Department if any problems should occur. ° °Please show the CHEMOTHERAPY ALERT CARD or IMMUNOTHERAPY ALERT CARD at check-in  to the Emergency Department and triage nurse. ° °Should you have questions after your visit or need to cancel or reschedule your appointment, please contact [facility]  Dept: [phone]  and follow the prompts.  Office hours are 8:00 a.m. to 4:30 p.m. Monday - Friday. Please note that voicemails left after 4:00 p.m. may not be returned until the following business day.  We are closed weekends and major holidays. You have access to a nurse at all times for urgent questions. Please call the main number to the clinic Dept: [phone] and follow the prompts. ° ° °For any non-urgent questions, you may also contact your provider using MyChart. We now offer e-Visits for anyone 18 and older to request care online for non-urgent symptoms. For details visit [email or web address]. °  °Also download the MyChart app! Go to the app store, search "MyChart", open the app, select [facility], and log in with your MyChart username and password. ° °Due to Covid, a mask is required upon entering the hospital/clinic. If you do not have a mask, one will be given to you upon arrival. For doctor visits, patients may have 1 support person aged 18 or older with them. For treatment visits, patients cannot have anyone with them due to current Covid guidelines and our immunocompromised population.  ° °

## 2021-03-12 NOTE — Progress Notes (Signed)
Subjective:  Patient ID: Jack Haynes, male    DOB: 01/27/1941  Age: 81 y.o. MRN: [id number]  CC:  Chief Complaint  Patient presents with   Follow-up    Back pain      HPI  This patient arrives today for the above.  I saw him a few weeks ago for pain in his thoracic spine.  X-ray completed which showed signs of DISH.  He is here discussed these results.  At last office visit I prescribed Flexeril he can take as needed he tells me he takes this in the evening and it does seem to help his symptoms.  He is also using a combination of heat and Tylenol and pain is improved.  Past Medical History:  Diagnosis Date   Arthritis    Cancer ([place])    hx of prostatae cancer   Dyspnea    until chest tube placed   Hypertension    Mesothelioma [facility])       Family History  Problem Relation Age of Onset   Cancer Sister        brain tumor   Heart disease Brother    Alzheimer's disease Brother    Heart disease Sister    Alzheimer's disease Father    Hypertension Sister    Colon cancer Neg Hx    Colon polyps Neg Hx    Esophageal cancer Neg Hx    Rectal cancer Neg Hx    Stomach cancer Neg Hx     Social History   Social History Narrative   Not on file   Social History   Tobacco Use   Smoking status: Former    Years: 15.00    Types: Cigarettes    Quit date: 02/15/1981    Years since quitting: 40.0   Smokeless tobacco: Former    Types: Chew    Quit date: 12/2018  Substance Use Topics   Alcohol use: Yes    Alcohol/week: 3.0 standard drinks    Types: 3 Shots of liquor per week     Current Meds  Medication Sig   acetaminophen (TYLENOL) 500 MG tablet Take 2 tablets (1,000 mg total) by mouth every 6 (six) hours as needed for mild pain or fever. (Patient taking differently: Take 500 mg by mouth every 6 (six) hours as needed for mild pain or fever.)   aspirin EC 81 MG tablet Take 81 mg by mouth daily. Swallow whole.   calcium carbonate (TUMS - DOSED IN MG ELEMENTAL  CALCIUM) 500 MG chewable tablet Chew 1,000 mg by mouth daily as needed for indigestion or heartburn.   Cetirizine HCl (ZYRTEC ALLERGY) 10 MG CAPS Zyrtec 10 mg capsule  Take by oral route.   dexamethasone (DECADRON) 4 MG tablet TAKE 1 TABLET BY MOUTH TWICE DAILY THE DAY BEFORE, DAY OF AND DAY AFTER CHEMOTHERAPY EVERY 3 WEEKS   empagliflozin (JARDIANCE) 10 MG TABS tablet Take by mouth daily.   ferrous sulfate 325 (65 FE) MG tablet Take 325 mg by mouth daily with breakfast.   fluconazole (DIFLUCAN) 100 MG tablet Take 1 tablet (100 mg total) by mouth daily.   folic acid (FOLVITE) 1 MG tablet TAKE 1 TABLET BY MOUTH EVERY DAY   lidocaine-prilocaine (EMLA) cream Apply to Port-A-Cath site 30-60-minute before treatment.   losartan-hydrochlorothiazide (HYZAAR) 100-12.5 MG tablet TAKE 1 TABLET BY MOUTH EVERY DAY   metFORMIN (GLUCOPHAGE) 500 MG tablet TAKE 1 TABLET BY MOUTH 2 TIMES DAILY WITH A MEAL.   mometasone (ELOCON)  0.1 % cream Apply topically 2 (two) times daily.   ondansetron (ZOFRAN ODT) 4 MG disintegrating tablet Take 1 tablet (4 mg total) by mouth every 8 (eight) hours as needed for nausea or vomiting. Starting 3 days after chemotherapy   prochlorperazine (COMPAZINE) 10 MG tablet TAKE 1 TABLET BY MOUTH EVERY 6 HOURS AS NEEDED FOR NAUSEA OR VOMITING.   rosuvastatin (CRESTOR) 20 MG tablet TAKE 1 TABLET BY MOUTH EVERY DAY   simethicone (MYLICON) 741 MG chewable tablet Chew 125 mg by mouth daily as needed (gas).   [DISCONTINUED] cyclobenzaprine (FLEXERIL) 5 MG tablet Take 1 tablet (5 mg total) by mouth 3 (three) times daily as needed for muscle spasms.    ROS:  Review of Systems  Constitutional:  Negative for fever.  Cardiovascular:  Negative for chest pain.  Musculoskeletal:  Positive for back pain.    Objective:   Today's Vitals: BP 118/74 (BP Location: Left Arm, Patient Position: Sitting, Cuff Size: Normal)    Pulse 95    Temp 98.4 F (36.9 C) (Oral)    Ht 5\' 7"  (1.702 m)    Wt 196 lb  (88.9 kg)    SpO2 95%    BMI 30.70 kg/m  Vitals with BMI 03/12/2021 03/12/2021 03/10/2021  Height 5\' 7"  5\' 7"  5\' 7"   Weight 196 lbs 194 lbs 13 oz 195 lbs 2 oz  BMI 30.69 28.7 86.76  Systolic 720 947 096  Diastolic 74 87 70  Pulse 95 95 104     Physical Exam Vitals reviewed.  Constitutional:      Appearance: Normal appearance.  HENT:     Head: Normocephalic and atraumatic.  Cardiovascular:     Rate and Rhythm: Normal rate and regular rhythm.  Pulmonary:     Effort: Pulmonary effort is normal.     Breath sounds: Normal breath sounds.  Musculoskeletal:     Cervical back: Neck supple.  Skin:    General: Skin is warm and dry.  Neurological:     Mental Status: He is alert and oriented to person, place, and time.  Psychiatric:        Mood and Affect: Mood normal.        Behavior: Behavior normal.        Thought Content: Thought content normal.        Judgment: Judgment normal.         Assessment and Plan   1. DISH (diffuse idiopathic skeletal hyperostosis)   2. Acute pain of left shoulder   3. Left flank pain      Plan: 1.-3.  We did review x-ray results and I would recommend he continue to take Flexeril, Tylenol, and use heat as needed for pain management.  Also recommended referral to physical therapy to manage his pain.  He is agreeable to this and we will order referral today.   Tests ordered Orders Placed This Encounter  Procedures   Ambulatory referral to Physical Therapy      Meds ordered this encounter  Medications   cyclobenzaprine (FLEXERIL) 5 MG tablet    Sig: Take 1 tablet (5 mg total) by mouth 3 (three) times daily as needed for muscle spasms.    Dispense:  30 tablet    Refill:  2    Order Specific Question:   Supervising Provider    Answer:   Binnie Rail [[id number]]    Patient to follow-up with PCP this upcoming September when he will be due for his annual exam, or sooner  as needed. I spent 21 minutes dedicated to the care of this patient on  the date of this encounter which includes a combination of either face-to-face or virtual contact with the patient and review of  imaging results.    Ailene Ards, NP

## 2021-03-12 NOTE — Progress Notes (Signed)
Per Dr. Julien Nordmann ,it is ok to treat pt today with Avastin and urine protein of 100.

## 2021-03-12 NOTE — Progress Notes (Signed)
[facility] [phone]   [phone]  OFFICE PROGRESS NOTE  [name], MD [place] [address]  DIAGNOSIS: Stage I/II malignant pleural mesothelioma involving the left hemithorax diagnosed in September 2021.  PRIOR THERAPY: None.  CURRENT THERAPY:  Systemic chemotherapy with carboplatin for AUC of 5, Alimta 500 mg/M2 and Avastin 15 mg/KG every 3 weeks.  Status post 22 cycles.  Starting from cycle #7 the patient will be on treatment with maintenance Avastin 15 mg/KG every 3 weeks.  INTERVAL HISTORY: Jack Haynes 81 y.o. male returns to the clinic today for follow-up visit accompanied by his girlfriend.  The patient is feeling fine today with no concerning complaints.  His fatigue is much better.  He now exercises at regular basis.  He denied having any current chest pain but has occasional left upper quadrant pain that is moving around.  He denied having any shortness of breath, cough or hemoptysis.  He denied having any fever or chills.  He has no nausea, vomiting, diarrhea or constipation.  He has no headache or visual changes.  He is here today for evaluation before starting cycle #23.  MEDICAL HISTORY: Past Medical History:  Diagnosis Date   Arthritis    Cancer ([place])    hx of prostatae cancer   Dyspnea    until chest tube placed   Hypertension    Mesothelioma [facility])     ALLERGIES:  has No Known Allergies.  MEDICATIONS:  Current Outpatient Medications  Medication Sig Dispense Refill   acetaminophen (TYLENOL) 500 MG tablet Take 2 tablets (1,000 mg total) by mouth every 6 (six) hours as needed for mild pain or fever. (Patient taking differently: Take 500 mg by mouth every 6 (six) hours as needed for mild pain or fever.) 30 tablet 0   aspirin EC 81 MG tablet Take 81 mg by mouth daily. Swallow whole.     calcium carbonate (TUMS - DOSED IN MG ELEMENTAL CALCIUM) 500 MG chewable tablet Chew 1,000 mg by mouth daily as  needed for indigestion or heartburn.     Cetirizine HCl (ZYRTEC ALLERGY) 10 MG CAPS Zyrtec 10 mg capsule  Take by oral route.     cyclobenzaprine (FLEXERIL) 5 MG tablet Take 1 tablet (5 mg total) by mouth 3 (three) times daily as needed for muscle spasms. 30 tablet 0   dexamethasone (DECADRON) 4 MG tablet TAKE 1 TABLET BY MOUTH TWICE DAILY THE DAY BEFORE, DAY OF AND DAY AFTER CHEMOTHERAPY EVERY 3 WEEKS 40 tablet 1   empagliflozin (JARDIANCE) 10 MG TABS tablet Take by mouth daily.     ferrous sulfate 325 (65 FE) MG tablet Take 325 mg by mouth daily with breakfast.     fluconazole (DIFLUCAN) 100 MG tablet Take 1 tablet (100 mg total) by mouth daily. 7 tablet 0   folic acid (FOLVITE) 1 MG tablet TAKE 1 TABLET BY MOUTH EVERY DAY 90 tablet 1   lidocaine-prilocaine (EMLA) cream Apply to Port-A-Cath site 30-60-minute before treatment. 30 g 0   losartan-hydrochlorothiazide (HYZAAR) 100-12.5 MG tablet TAKE 1 TABLET BY MOUTH EVERY DAY 90 tablet 3   metFORMIN (GLUCOPHAGE) 500 MG tablet TAKE 1 TABLET BY MOUTH 2 TIMES DAILY WITH A MEAL. 180 tablet 3   mometasone (ELOCON) 0.1 % cream Apply topically 2 (two) times daily.     ondansetron (ZOFRAN ODT) 4 MG disintegrating tablet Take 1 tablet (4 mg total) by mouth every 8 (eight) hours as needed  for nausea or vomiting. Starting 3 days after chemotherapy 30 tablet 1   prochlorperazine (COMPAZINE) 10 MG tablet TAKE 1 TABLET BY MOUTH EVERY 6 HOURS AS NEEDED FOR NAUSEA OR VOMITING. 30 tablet 0   rosuvastatin (CRESTOR) 20 MG tablet TAKE 1 TABLET BY MOUTH EVERY DAY 90 tablet 3   simethicone (MYLICON) 782 MG chewable tablet Chew 125 mg by mouth daily as needed (gas).     No current facility-administered medications for this visit.    SURGICAL HISTORY:  Past Surgical History:  Procedure Laterality Date   APPENDECTOMY     BACK SURGERY  1998   disectomy   CHEST TUBE INSERTION Left 11/13/2019   Procedure: INSERTION PLEURAL DRAINAGE CATHETER;  Surgeon: Grace Isaac, MD;  Location: [facility];  Service: Thoracic;  Laterality: Left;   EYE SURGERY Left 1965   injury   IR [place]  05/27/2020   JOINT REPLACEMENT Left 2010   left knee replacement   KNEE SURGERY     PLEURAL BIOPSY Left 11/13/2019   Procedure: PLEURAL BIOPSY;  Surgeon: Grace Isaac, MD;  Location: [facility];  Service: Thoracic;  Laterality: Left;   PORTACATH PLACEMENT Left 11/26/2019   Procedure: INSERTION PORT-A-CATH BARD POWERPORT 9.6 FR CATHETER;  Surgeon: Grace Isaac, MD;  Location: [facility];  Service: Thoracic;  Laterality: Left;   PROSTATE SURGERY     REMOVAL OF PLEURAL DRAINAGE CATHETER Left 11/26/2019   Procedure: REMOVAL OF PLEURAL DRAINAGE CATHETER;  Surgeon: Grace Isaac, MD;  Location: [facility];  Service: Thoracic;  Laterality: Left;   SPINE SURGERY     TALC PLEURODESIS Left 11/13/2019   Procedure: possible TALC PLEURADESIS;  Surgeon: Grace Isaac, MD;  Location: [facility];  Service: Thoracic;  Laterality: Left;   TOTAL KNEE ARTHROPLASTY  02/01/2012   Procedure: TOTAL KNEE ARTHROPLASTY;  Surgeon: Sydnee Cabal, MD;  Location: [facility];  Service: Orthopedics;  Laterality: Left;   VIDEO ASSISTED THORACOSCOPY Left 11/13/2019   Procedure: VIDEO ASSISTED THORACOSCOPY;  Surgeon: Grace Isaac, MD;  Location: [facility];  Service: Thoracic;  Laterality: Left;   VIDEO BRONCHOSCOPY N/A 11/13/2019   Procedure: VIDEO BRONCHOSCOPY;  Surgeon: Grace Isaac, MD;  Location: [facility];  Service: Thoracic;  Laterality: N/A;    REVIEW OF SYSTEMS:  A comprehensive review of systems was negative except for: Respiratory: positive for dyspnea on exertion   PHYSICAL EXAMINATION: General appearance: alert, cooperative, and no distress Head: Normocephalic, without obvious abnormality, atraumatic Neck: no adenopathy, no JVD, supple, symmetrical, trachea midline, and thyroid not enlarged, symmetric, no tenderness/mass/nodules Lymph nodes: Cervical, supraclavicular,  and axillary nodes normal. Resp: clear to auscultation bilaterally Back: symmetric, no curvature. ROM normal. No CVA tenderness. Cardio: regular rate and rhythm, S1, S2 normal, no murmur, click, rub or gallop GI: soft, non-tender; bowel sounds normal; no masses,  no organomegaly Extremities: extremities normal, atraumatic, no cyanosis or edema  ECOG PERFORMANCE STATUS: 1 - Symptomatic but completely ambulatory  Blood pressure (!) 143/87, pulse 95, temperature (!) 96.9 F (36.1 C), temperature source Tympanic, resp. rate 19, height 5\' 7"  (1.702 m), weight 194 lb 12.8 oz (88.4 kg), SpO2 95 %.  LABORATORY DATA: Lab Results  Component Value Date   WBC 6.9 03/12/2021   HGB 12.8 (L) 03/12/2021   HCT 38.4 (L) 03/12/2021   MCV 88.7 03/12/2021   PLT 184 03/12/2021      Chemistry      Component Value Date/Time   NA  138 02/19/2021 0750   NA 141 09/24/2019 0832   K 3.9 02/19/2021 0750   CL 101 02/19/2021 0750   CO2 24 02/19/2021 0750   BUN 18 02/19/2021 0750   BUN 17 09/24/2019 0832   CREATININE 1.24 02/19/2021 0750   CREATININE 1.01 12/24/2014 0916      Component Value Date/Time   CALCIUM 9.0 02/19/2021 0750   ALKPHOS 55 02/19/2021 0750   AST 15 02/19/2021 0750   ALT 14 02/19/2021 0750   BILITOT 0.6 02/19/2021 0750       RADIOGRAPHIC STUDIES: DG Thoracic Spine 2 View  Result Date: 02/27/2021 CLINICAL DATA:  Back pain EXAM: THORACIC SPINE 2 VIEWS COMPARISON:  None. FINDINGS: Alignment is within normal limits. Confluent flowing anterior osteophytes consistent with diffuse idiopathic skeletal hyperostosis. No vertebral body height loss. Left chest port terminates in the region the upper superior vena cava. Left basilar lung opacity consistent with pleural nodularity seen on recent chest CT. IMPRESSION: Mild multilevel degenerative changes of the thoracic spine. Electronically Signed   By: Miachel Roux M.D.   On: 02/27/2021 06:54   CT Chest W Contrast  Result Date:  02/15/2021 CLINICAL DATA:  81 year old male with history of mesothelioma undergoing ongoing chemotherapy. Evaluate for treatment response. EXAM: CT CHEST WITH CONTRAST TECHNIQUE: Multidetector CT imaging of the chest was performed during intravenous contrast administration. CONTRAST:  62mL OMNIPAQUE IOHEXOL 350 MG/ML SOLN COMPARISON:  Chest CT 12/16/2020. FINDINGS: Cardiovascular: Heart size is normal. There is some pericardial thickening and nodularity adjacent to the apex of the left ventricle, suggestive of potential pericardial involvement. No pericardial calcification. There is aortic atherosclerosis, as well as atherosclerosis of the great vessels of the mediastinum and the coronary arteries, including calcified atherosclerotic plaque in the left main, left anterior descending, left circumflex and right coronary arteries. Calcifications of the aortic valve. Left internal jugular single-lumen porta cath with tip terminating in the distal superior vena cava. Mediastinum/Nodes: No pathologically enlarged mediastinal or hilar lymph nodes. Esophagus is unremarkable in appearance. No axillary lymphadenopathy. Lungs/Pleura: Diffuse pleural thickening and enhancing nodularity, similar to the prior examination. The bulkiest area of pleural nodularity is in the medial aspect of the left hemithorax (axial image 109 of series 2) currently measures 3.3 x 2.2 cm, previously 2.7 x 2.2 cm). Additional area of nodularity in the anterior aspect of the left base (axial image 117 of series 2) measuring 3.6 x 2.7 cm (previously 2.6 x 2.0 cm). No pleural effusions. No pneumothorax. No acute consolidative airspace disease. Upper Abdomen: Aortic atherosclerosis. Diffuse low attenuation throughout the visualized hepatic parenchyma, indicative of a background of hepatic steatosis. Musculoskeletal: There are no aggressive appearing lytic or blastic lesions noted in the visualized portions of the skeleton. IMPRESSION: 1. Similar diffuse  pleural nodularity and enhancement in the left hemithorax with slight enlargement of index areas of nodularity compared to the prior study, suggesting slight progression of disease. There is also some subtle pericardial nodularity concerning for pericardial involvement. 2. Aortic atherosclerosis, in addition to left main and three-vessel coronary artery disease. 3. There are calcifications of the aortic valve. Echocardiographic correlation for evaluation of potential valvular dysfunction may be warranted if clinically indicated. 4. Hepatic steatosis. Aortic Atherosclerosis (ICD10-I70.0). Electronically Signed   By: Vinnie Langton M.D.   On: 02/15/2021 15:35     ASSESSMENT AND PLAN: This is a very pleasant 81 years old white male recently diagnosed with malignant pleural mesothelioma involving the left hemithorax. The patient is currently undergoing systemic chemotherapy with carboplatin  for AUC of 5, Alimta 500 mg/M2 and Avastin 15 mg/KG every 3 weeks.  Status post 22 cycles.  Starting from cycle #7 the patient will be on maintenance treatment with Avastin 15 mg/KG every 3 weeks. The patient has been tolerating his treatment with Avastin fairly well. I recommended for him to proceed with cycle #23 today as planned. The patient will come back for follow-up visit in 3 weeks for evaluation before the next cycle of his treatment. He was advised to call immediately if he has any concerning symptoms in the interval. The patient voices understanding of current disease status and treatment options and is in agreement with the current care plan.  All questions were answered. The patient knows to call the clinic with any problems, questions or concerns. We can certainly see the patient much sooner if necessary.   Disclaimer: This note was dictated with voice recognition software. Similar sounding words can inadvertently be transcribed and may not be corrected upon review.

## 2021-03-16 ENCOUNTER — Inpatient Hospital Stay: Payer: PPO

## 2021-03-16 ENCOUNTER — Inpatient Hospital Stay: Payer: PPO | Admitting: Internal Medicine

## 2021-03-17 ENCOUNTER — Inpatient Hospital Stay (HOSPITAL_BASED_OUTPATIENT_CLINIC_OR_DEPARTMENT_OTHER)
Admission: EM | Admit: 2021-03-17 | Discharge: 2021-03-19 | DRG: 392 | Disposition: A | Discharge status: Home / Self Care | Payer: PPO | Attending: Internal Medicine | Admitting: Internal Medicine

## 2021-03-17 ENCOUNTER — Other Ambulatory Visit: Payer: Self-pay

## 2021-03-17 ENCOUNTER — Emergency Department (HOSPITAL_BASED_OUTPATIENT_CLINIC_OR_DEPARTMENT_OTHER): Payer: PPO

## 2021-03-17 ENCOUNTER — Encounter (HOSPITAL_BASED_OUTPATIENT_CLINIC_OR_DEPARTMENT_OTHER): Payer: Self-pay

## 2021-03-17 DIAGNOSIS — I700 Atherosclerosis of aorta: Secondary | ICD-10-CM | POA: Diagnosis not present

## 2021-03-17 DIAGNOSIS — I10 Essential (primary) hypertension: Secondary | ICD-10-CM | POA: Diagnosis present

## 2021-03-17 DIAGNOSIS — Z808 Family history of malignant neoplasm of other organs or systems: Secondary | ICD-10-CM

## 2021-03-17 DIAGNOSIS — Z7952 Long term (current) use of systemic steroids: Secondary | ICD-10-CM

## 2021-03-17 DIAGNOSIS — Z7984 Long term (current) use of oral hypoglycemic drugs: Secondary | ICD-10-CM | POA: Diagnosis not present

## 2021-03-17 DIAGNOSIS — Z8249 Family history of ischemic heart disease and other diseases of the circulatory system: Secondary | ICD-10-CM

## 2021-03-17 DIAGNOSIS — Z7982 Long term (current) use of aspirin: Secondary | ICD-10-CM

## 2021-03-17 DIAGNOSIS — R1032 Left lower quadrant pain: Secondary | ICD-10-CM | POA: Diagnosis present

## 2021-03-17 DIAGNOSIS — Z87891 Personal history of nicotine dependence: Secondary | ICD-10-CM

## 2021-03-17 DIAGNOSIS — C457 Mesothelioma of other sites: Secondary | ICD-10-CM | POA: Diagnosis not present

## 2021-03-17 DIAGNOSIS — Z20822 Contact with and (suspected) exposure to covid-19: Secondary | ICD-10-CM | POA: Diagnosis not present

## 2021-03-17 DIAGNOSIS — Z79899 Other long term (current) drug therapy: Secondary | ICD-10-CM | POA: Diagnosis not present

## 2021-03-17 DIAGNOSIS — Z9049 Acquired absence of other specified parts of digestive tract: Secondary | ICD-10-CM

## 2021-03-17 DIAGNOSIS — E1165 Type 2 diabetes mellitus with hyperglycemia: Secondary | ICD-10-CM | POA: Diagnosis not present

## 2021-03-17 DIAGNOSIS — K5792 Diverticulitis of intestine, part unspecified, without perforation or abscess without bleeding: Secondary | ICD-10-CM

## 2021-03-17 DIAGNOSIS — R109 Unspecified abdominal pain: Secondary | ICD-10-CM | POA: Diagnosis not present

## 2021-03-17 DIAGNOSIS — K5720 Diverticulitis of large intestine with perforation and abscess without bleeding: Principal | ICD-10-CM | POA: Diagnosis present

## 2021-03-17 LAB — CBC
HCT: 46.2 % (ref 39.0–52.0)
Hemoglobin: 15.2 g/dL (ref 13.0–17.0)
MCH: 29.3 pg (ref 26.0–34.0)
MCHC: 32.9 g/dL (ref 30.0–36.0)
MCV: 89 fL (ref 80.0–100.0)
Platelets: 204 10*3/uL (ref 150–400)
RBC: 5.19 MIL/uL (ref 4.22–5.81)
RDW: 14.6 % (ref 11.5–15.5)
WBC: 9.7 10*3/uL (ref 4.0–10.5)
nRBC: 0 % (ref 0.0–0.2)

## 2021-03-17 LAB — URINALYSIS, ROUTINE W REFLEX MICROSCOPIC
Bilirubin Urine: NEGATIVE
Glucose, UA: >1000 mg/dL — AB
Hgb urine dipstick: NEGATIVE
Ketones, ur: NEGATIVE mg/dL
Leukocytes,Ua: NEGATIVE
Nitrite: NEGATIVE
Protein, ur: 30 mg/dL — AB
Specific Gravity, Urine: 1.024 (ref 1.005–1.030)
pH: 5.5 (ref 5.0–8.0)

## 2021-03-17 LAB — COMPREHENSIVE METABOLIC PANEL
ALT: 15 U/L (ref 0–44)
AST: 14 U/L — ABNORMAL LOW (ref 15–41)
Albumin: 4.5 g/dL (ref 3.5–5.0)
Alkaline Phosphatase: 57 U/L (ref 38–126)
Anion gap: 13 (ref 5–15)
BUN: 21 mg/dL (ref 8–23)
CO2: 26 mmol/L (ref 22–32)
Calcium: 10.4 mg/dL — ABNORMAL HIGH (ref 8.9–10.3)
Chloride: 99 mmol/L (ref 98–111)
Creatinine, Ser: 1.16 mg/dL (ref 0.61–1.24)
GFR, Estimated: >60 mL/min (ref >60)
Glucose, Bld: 142 mg/dL — ABNORMAL HIGH (ref 70–99)
Potassium: 4 mmol/L (ref 3.5–5.1)
Sodium: 138 mmol/L (ref 135–145)
Total Bilirubin: 0.5 mg/dL (ref 0.3–1.2)
Total Protein: 7.4 g/dL (ref 6.5–8.1)

## 2021-03-17 LAB — RESP PANEL BY RT-PCR (FLU A&B, COVID) ARPGX2
Influenza A by PCR: NEGATIVE
Influenza B by PCR: NEGATIVE
SARS Coronavirus 2 by RT PCR: NEGATIVE

## 2021-03-17 LAB — LIPASE, BLOOD: Lipase: 37 U/L (ref 11–51)

## 2021-03-17 LAB — GLUCOSE, CAPILLARY: Glucose-Capillary: 114 mg/dL — ABNORMAL HIGH (ref 70–99)

## 2021-03-17 MED ORDER — ACETAMINOPHEN 650 MG RE SUPP: 650.0000 mg | Freq: Four times a day (QID) | RECTAL | Status: DC | PRN

## 2021-03-17 MED ORDER — SODIUM CHLORIDE 0.9 % IV SOLN
INTRAVENOUS | Status: DC | PRN
  Administered 2021-03-17: 10 mL via INTRAVENOUS

## 2021-03-17 MED ORDER — PANTOPRAZOLE SODIUM 40 MG PO TBEC
40.0000 mg | DELAYED_RELEASE_TABLET | Freq: Every day | ORAL | Status: DC
  Administered 2021-03-18 – 2021-03-19 (×2): 40 mg via ORAL
  Filled 2021-03-17 (×2): qty 1

## 2021-03-17 MED ORDER — ACETAMINOPHEN 325 MG PO TABS
650.0000 mg | ORAL_TABLET | Freq: Four times a day (QID) | ORAL | Status: DC | PRN
  Filled 2021-03-17: qty 2

## 2021-03-17 MED ORDER — PIPERACILLIN-TAZOBACTAM 3.375 G IVPB 30 MIN
3.3750 g | Freq: Once | INTRAVENOUS | Status: AC
  Administered 2021-03-17: 3.375 g via INTRAVENOUS
  Filled 2021-03-17: qty 50

## 2021-03-17 MED ORDER — ONDANSETRON HCL 4 MG/2ML IJ SOLN
4.0000 mg | Freq: Once | INTRAMUSCULAR | Status: AC
  Administered 2021-03-17: 4 mg via INTRAVENOUS
  Filled 2021-03-17: qty 2

## 2021-03-17 MED ORDER — INSULIN ASPART 100 UNIT/ML IJ SOLN
0.0000 [IU] | INTRAMUSCULAR | Status: DC
  Administered 2021-03-18: 1 [IU] via SUBCUTANEOUS

## 2021-03-17 MED ORDER — ONDANSETRON HCL 4 MG/2ML IJ SOLN: 4.0000 mg | Freq: Four times a day (QID) | INTRAMUSCULAR | Status: DC | PRN

## 2021-03-17 MED ORDER — MORPHINE SULFATE (PF) 4 MG/ML IV SOLN
4.0000 mg | Freq: Once | INTRAVENOUS | Status: AC
Start: 2021-03-17 — End: 2021-03-17
  Administered 2021-03-17: 4 mg via INTRAVENOUS
  Filled 2021-03-17: qty 1

## 2021-03-17 MED ORDER — PIPERACILLIN-TAZOBACTAM 3.375 G IVPB
3.3750 g | Freq: Three times a day (TID) | INTRAVENOUS | Status: DC
  Administered 2021-03-17 – 2021-03-19 (×5): 3.375 g via INTRAVENOUS
  Filled 2021-03-17 (×7): qty 50

## 2021-03-17 MED ORDER — SODIUM CHLORIDE 0.9 % IV SOLN: INTRAVENOUS | Status: AC

## 2021-03-17 MED ORDER — MORPHINE SULFATE (PF) 2 MG/ML IV SOLN
2.0000 mg | INTRAVENOUS | Status: DC | PRN
  Administered 2021-03-17: 4 mg via INTRAVENOUS
  Filled 2021-03-17: qty 2

## 2021-03-17 MED ORDER — IOHEXOL 300 MG/ML  SOLN
80.0000 mL | Freq: Once | INTRAMUSCULAR | Status: AC | PRN
  Administered 2021-03-17: 80 mL via INTRAVENOUS

## 2021-03-17 MED ORDER — SODIUM CHLORIDE 0.9 % IV BOLUS
500.0000 mL | Freq: Once | INTRAVENOUS | Status: AC
  Administered 2021-03-17: 500 mL via INTRAVENOUS

## 2021-03-17 MED ORDER — ROSUVASTATIN CALCIUM 20 MG PO TABS
20.0000 mg | ORAL_TABLET | Freq: Every day | ORAL | Status: DC
  Administered 2021-03-17 – 2021-03-18 (×2): 20 mg via ORAL
  Filled 2021-03-17 (×2): qty 1

## 2021-03-17 MED ORDER — MORPHINE SULFATE (PF) 4 MG/ML IV SOLN
4.0000 mg | Freq: Once | INTRAVENOUS | Status: AC
  Administered 2021-03-17: 4 mg via INTRAVENOUS
  Filled 2021-03-17: qty 1

## 2021-03-17 MED ORDER — FLUCONAZOLE 100 MG PO TABS
100.0000 mg | ORAL_TABLET | Freq: Every day | ORAL | Status: DC
  Administered 2021-03-17: 100 mg via ORAL
  Filled 2021-03-17: qty 1

## 2021-03-17 NOTE — Progress Notes (Signed)
Pharmacy Antibiotic Note  Jack Haynes is a 81 y.o. male admitted on 03/17/2021 with abdominal pain. Pharmacy has been consulted for zosyn dosing. Pt is afebrile and WBC is WNL. SCr is WNL and at baseline.   Plan: Zosyn 3.375gm IV Q8H (4 hr inf) F/u renal fxn, C&S, clinical status and LOT *Pharmacy will sign off an only follow peripherally as no further dose adjustments are anticipated.   Height: 5\' 7"  (170.2 cm) Weight: 88.9 kg (195 lb 15.8 oz) IBW/kg (Calculated) : 66.1  Temp (24hrs), Avg:97.9 F (36.6 C), Min:97.9 F (36.6 C), Max:97.9 F (36.6 C)  Recent Labs  Lab 03/12/21 0744 03/17/21 1141  WBC 6.9 9.7  CREATININE 1.19 1.16    Estimated Creatinine Clearance: 54 mL/min (by C-G formula based on SCr of 1.16 mg/dL).    No Known Allergies  Antimicrobials this admission: Zosyn 1/31>>  Dose adjustments this admission: N/A  Microbiology results: Pending  Thank you for allowing pharmacy to be a part of this patients care.  Alyjah Lovingood, Rande Lawman 03/17/2021 3:36 PM

## 2021-03-17 NOTE — ED Notes (Signed)
[facility] here to transport pt. Pt stable at time of departure.

## 2021-03-17 NOTE — H&P (Signed)
History and Physical    Jack Haynes UJW:[id number] DOB: 04-12-1940 DOA: 03/17/2021  PCP: [name], MD   Patient coming from: Home   Chief Complaint: Abdominal pain   HPI: Jack Haynes is a pleasant 81 y.o. male with medical history significant for mesothelioma, hypertension, and type 2 diabetes mellitus, now presenting to the emergency department for evaluation of abdominal pain.  The patient reports that he has been suffering from lower abdominal pain for the past 3 weeks.  Pain has been waxing and waning, radiating from the lower abdomen around towards his left scapula, and without any associated subjective fever, chills, or bleeding.  He had tried Flexeril without any relief.  [facility] ED Course: Upon arrival to the ED, patient is found to be afebrile, saturating well on room air, and hemodynamically stable.  Chemistry panel notable for hypercalcemia and CBC unremarkable.  CT of the abdomen and pelvis findings suggest recent sigmoid diverticulitis with microperforation.  Patient was given IV fluids, Zosyn, morphine, and Zofran in the emergency department before he was transferred to [facility] for admission.  Review of Systems:  All other systems reviewed and apart from HPI, are negative.  Past Medical History:  Diagnosis Date   Arthritis    Cancer ([place])    hx of prostatae cancer   Dyspnea    until chest tube placed   Hypertension    Mesothelioma [facility])     Past Surgical History:  Procedure Laterality Date   APPENDECTOMY     BACK SURGERY  1998   disectomy   CHEST TUBE INSERTION Left 11/13/2019   Procedure: INSERTION PLEURAL DRAINAGE CATHETER;  Surgeon: Grace Isaac, MD;  Location: [place];  Service: Thoracic;  Laterality: Left;   EYE SURGERY Left 1965   injury   IR [place]  05/27/2020   JOINT REPLACEMENT Left 2010   left knee replacement   KNEE SURGERY     PLEURAL BIOPSY Left 11/13/2019   Procedure: PLEURAL BIOPSY;   Surgeon: Grace Isaac, MD;  Location: [place];  Service: Thoracic;  Laterality: Left;   PORTACATH PLACEMENT Left 11/26/2019   Procedure: INSERTION PORT-A-CATH BARD POWERPORT 9.6 FR CATHETER;  Surgeon: Grace Isaac, MD;  Location: [place];  Service: Thoracic;  Laterality: Left;   PROSTATE SURGERY     REMOVAL OF PLEURAL DRAINAGE CATHETER Left 11/26/2019   Procedure: REMOVAL OF PLEURAL DRAINAGE CATHETER;  Surgeon: Grace Isaac, MD;  Location: [place];  Service: Thoracic;  Laterality: Left;   SPINE SURGERY     TALC PLEURODESIS Left 11/13/2019   Procedure: possible TALC PLEURADESIS;  Surgeon: Grace Isaac, MD;  Location: [place];  Service: Thoracic;  Laterality: Left;   TOTAL KNEE ARTHROPLASTY  02/01/2012   Procedure: TOTAL KNEE ARTHROPLASTY;  Surgeon: Sydnee Cabal, MD;  Location: [facility];  Service: Orthopedics;  Laterality: Left;   VIDEO ASSISTED THORACOSCOPY Left 11/13/2019   Procedure: VIDEO ASSISTED THORACOSCOPY;  Surgeon: Grace Isaac, MD;  Location: [place];  Service: Thoracic;  Laterality: Left;   VIDEO BRONCHOSCOPY N/A 11/13/2019   Procedure: VIDEO BRONCHOSCOPY;  Surgeon: Grace Isaac, MD;  Location[facility];  Service: Thoracic;  Laterality: N/A;    Social History:   reports that he quit smoking about 40 years ago. His smoking use included cigarettes. He quit smokeless tobacco use about 2 years ago.  His smokeless tobacco use included chew. He reports current alcohol use of about 3.0 standard drinks  per week. He reports that he does not use drugs.  No Known Allergies  Family History  Problem Relation Age of Onset   Cancer Sister        brain tumor   Heart disease Brother    Alzheimer's disease Brother    Heart disease Sister    Alzheimer's disease Father    Hypertension Sister    Colon cancer Neg Hx    Colon polyps Neg Hx    Esophageal cancer Neg Hx    Rectal cancer Neg Hx    Stomach cancer Neg Hx      Prior to Admission medications   Medication Sig  Start Date End Date Taking? Authorizing Provider  acetaminophen (TYLENOL) 500 MG tablet Take 2 tablets (1,000 mg total) by mouth every 6 (six) hours as needed for mild pain or fever. Patient taking differently: Take 500 mg by mouth every 6 (six) hours as needed for mild pain or fever. 11/15/19  Yes Barrett, Lodema Hong, PA-C  aspirin EC 81 MG tablet Take 81 mg by mouth daily. Swallow whole.   Yes [provider]  calcium carbonate (TUMS - DOSED IN MG ELEMENTAL CALCIUM) 500 MG chewable tablet Chew 1,000 mg by mouth daily as needed for indigestion or heartburn.   Yes [provider]  Cetirizine HCl (ZYRTEC ALLERGY) 10 MG CAPS Zyrtec 10 mg capsule  Take by oral route.   Yes [provider]  cyclobenzaprine (FLEXERIL) 5 MG tablet Take 1 tablet (5 mg total) by mouth 3 (three) times daily as needed for muscle spasms. 03/12/21  Yes Ailene Ards, NP  dexamethasone (DECADRON) 4 MG tablet TAKE 1 TABLET BY MOUTH TWICE DAILY THE DAY BEFORE, DAY OF AND DAY AFTER CHEMOTHERAPY EVERY 3 WEEKS 09/17/20  Yes Curt Bears, MD  empagliflozin (JARDIANCE) 10 MG TABS tablet Take by mouth daily.   Yes [provider]  ferrous sulfate 325 (65 FE) MG tablet Take 325 mg by mouth daily with breakfast.   Yes [provider]  fluconazole (DIFLUCAN) 100 MG tablet Take 1 tablet (100 mg total) by mouth daily. 01/20/21  Yes Curt Bears, MD  folic acid (FOLVITE) 1 MG tablet TAKE 1 TABLET BY MOUTH EVERY DAY 02/11/21  Yes Heilingoetter, Cassandra L, PA-C  lidocaine-prilocaine (EMLA) cream Apply to Port-A-Cath site 30-60-minute before treatment. 09/04/20  Yes Curt Bears, MD  losartan-hydrochlorothiazide [facility]) 100-12.5 MG tablet TAKE 1 TABLET BY MOUTH EVERY DAY 01/03/21  Yes Sagardia, Ines Bloomer, MD  metFORMIN (GLUCOPHAGE) 500 MG tablet TAKE 1 TABLET BY MOUTH 2 TIMES DAILY WITH A MEAL. 03/11/21  Yes Sagardia, Ines Bloomer, MD  mometasone (ELOCON) 0.1 % cream Apply topically 2 (two) times  daily. 01/13/21  Yes [provider]  omeprazole (PRILOSEC) 20 MG capsule Take 20 mg by mouth daily.   Yes [provider]  ondansetron (ZOFRAN ODT) 4 MG disintegrating tablet Take 1 tablet (4 mg total) by mouth every 8 (eight) hours as needed for nausea or vomiting. Starting 3 days after chemotherapy 02/28/20  Yes Heilingoetter, Cassandra L, PA-C  prochlorperazine (COMPAZINE) 10 MG tablet TAKE 1 TABLET BY MOUTH EVERY 6 HOURS AS NEEDED FOR NAUSEA OR VOMITING. 09/17/20  Yes Curt Bears, MD  rosuvastatin (CRESTOR) 20 MG tablet TAKE 1 TABLET BY MOUTH EVERY DAY 08/01/20  Yes O'Neal, Cassie Freer, MD  simethicone (MYLICON) 867 MG chewable tablet Chew 125 mg by mouth daily as needed (gas).   Yes [provider]    Physical Exam: Vitals:  03/17/21 1800 03/17/21 1924 03/17/21 2000 03/17/21 2040  BP: (!) 136/100 (!) 120/93 (!) 125/95 131/87  Pulse: 100 99 (!) 102 99  Resp:  18 18 16   Temp:    (!) 97.4 F (36.3 C)  TempSrc:    Oral  SpO2: 94% 97% 94% 94%  Weight:      Height:        Constitutional: NAD, calm  Eyes: PERTLA, lids and conjunctivae normal ENMT: Mucous membranes are moist. Posterior pharynx clear of any exudate or lesions.   Neck: supple, no masses  Respiratory: no wheezing, no crackles. No accessory muscle use.  Cardiovascular: S1 & S2 heard, regular rate and rhythm. No extremity edema.   Abdomen: soft, tender in bilateral lower quadrants, no guarding or rebound pain. Bowel sounds active.  Musculoskeletal: no clubbing / cyanosis. No joint deformity upper and lower extremities.   Skin: no significant rashes, lesions, ulcers. Warm, dry, well-perfused. Neurologic: CN 2-12 grossly intact. Moving all extremities. Alert and oriented.  Psychiatric: Pleasant. Cooperative.    Labs and Imaging on Admission: I have personally reviewed following labs and imaging studies  CBC: Recent Labs  Lab 03/12/21 0744 03/17/21 1141  WBC 6.9 9.7  NEUTROABS 5.9  --    HGB 12.8* 15.2  HCT 38.4* 46.2  MCV 88.7 89.0  PLT 184 629   Basic Metabolic Panel: Recent Labs  Lab 03/12/21 0744 03/17/21 1141  NA 138 138  K 4.4 4.0  CL 102 99  CO2 26 26  GLUCOSE 207* 142*  BUN 19 21  CREATININE 1.19 1.16  CALCIUM 9.3 10.4*   GFR: Estimated Creatinine Clearance: 54 mL/min (by C-G formula based on SCr of 1.16 mg/dL). Liver Function Tests: Recent Labs  Lab 03/12/21 0744 03/17/21 1141  AST 17 14*  ALT 10 15  ALKPHOS 48 57  BILITOT 0.5 0.5  PROT 7.1 7.4  ALBUMIN 4.2 4.5   Recent Labs  Lab 03/17/21 1141  LIPASE 37   No results for input(s): AMMONIA in the last 168 hours. Coagulation Profile: No results for input(s): INR, PROTIME in the last 168 hours. Cardiac Enzymes: No results for input(s): CKTOTAL, CKMB, CKMBINDEX, TROPONINI in the last 168 hours. BNP (last 3 results) No results for input(s): PROBNP in the last 8760 hours. HbA1C: No results for input(s): HGBA1C in the last 72 hours. CBG: No results for input(s): GLUCAP in the last 168 hours. Lipid Profile: No results for input(s): CHOL, HDL, LDLCALC, TRIG, CHOLHDL, LDLDIRECT in the last 72 hours. Thyroid Function Tests: No results for input(s): TSH, T4TOTAL, FREET4, T3FREE, THYROIDAB in the last 72 hours. Anemia Panel: No results for input(s): VITAMINB12, FOLATE, FERRITIN, TIBC, IRON, RETICCTPCT in the last 72 hours. Urine analysis:    Component Value Date/Time   COLORURINE YELLOW 03/17/2021 1141   APPEARANCEUR CLEAR 03/17/2021 1141   LABSPEC 1.024 03/17/2021 1141   PHURINE 5.5 03/17/2021 1141   GLUCOSEU >1,000 (A) 03/17/2021 1141   HGBUR NEGATIVE 03/17/2021 1141   BILIRUBINUR NEGATIVE 03/17/2021 1141   BILIRUBINUR negative 02/26/2021 1632   KETONESUR NEGATIVE 03/17/2021 1141   PROTEINUR 30 (A) 03/17/2021 1141   UROBILINOGEN 1.0 02/26/2021 1632   UROBILINOGEN 0.2 01/27/2012 0818   NITRITE NEGATIVE 03/17/2021 1141   LEUKOCYTESUR NEGATIVE 03/17/2021 1141   Sepsis  Labs: @LABRCNTIP (procalcitonin:4,lacticidven:4) ) Recent Results (from the past 240 hour(s))  Resp Panel by RT-PCR (Flu A&B, Covid) Nasopharyngeal Swab     Status: None   Collection Time: 03/17/21  3:28 PM   Specimen: Nasopharyngeal  Swab; Nasopharyngeal(NP) swabs in vial transport medium  Result Value Ref Range Status   SARS Coronavirus 2 by RT PCR NEGATIVE NEGATIVE Final    Comment: (NOTE) SARS-CoV-2 target nucleic acids are NOT DETECTED.  The SARS-CoV-2 RNA is generally detectable in upper respiratory specimens during the acute phase of infection. The lowest concentration of SARS-CoV-2 viral copies this assay can detect is 138 copies/mL. A negative result does not preclude SARS-Cov-2 infection and should not be used as the sole basis for treatment or other patient management decisions. A negative result may occur with  improper specimen collection/handling, submission of specimen other than nasopharyngeal swab, presence of viral mutation(s) within the areas targeted by this assay, and inadequate number of viral copies(<138 copies/mL). A negative result must be combined with clinical observations, patient history, and epidemiological information. The expected result is Negative.  Fact Sheet for Patients:  EntrepreneurPulse.com.au  Fact Sheet for Healthcare Providers:  IncredibleEmployment.be  This test is no t yet approved or cleared by the [place] FDA and  has been authorized for detection and/or diagnosis of SARS-CoV-2 by FDA under an Emergency Use Authorization (EUA). This EUA will remain  in effect (meaning this test can be used) for the duration of the COVID-19 declaration under Section 564(b)(1) of the Act, 21 U.S.C.section 360bbb-3(b)(1), unless the authorization is terminated  or revoked sooner.       Influenza A by PCR NEGATIVE NEGATIVE Final   Influenza B by PCR NEGATIVE NEGATIVE Final    Comment: (NOTE) The Xpert Xpress  SARS-CoV-2/FLU/RSV plus assay is intended as an aid in the diagnosis of influenza from Nasopharyngeal swab specimens and should not be used as a sole basis for treatment. Nasal washings and aspirates are unacceptable for Xpert Xpress SARS-CoV-2/FLU/RSV testing.  Fact Sheet for Patients: EntrepreneurPulse.com.au  Fact Sheet for Healthcare Providers: IncredibleEmployment.be  This test is not yet approved or cleared by the [place] FDA and has been authorized for detection and/or diagnosis of SARS-CoV-2 by FDA under an Emergency Use Authorization (EUA). This EUA will remain in effect (meaning this test can be used) for the duration of the COVID-19 declaration under Section 564(b)(1) of the Act, 21 U.S.C. section 360bbb-3(b)(1), unless the authorization is terminated or revoked.  Performed at [facility], [address], [place], [place] [address]      Radiological Exams on Admission: CT ABDOMEN PELVIS W CONTRAST  Result Date: 03/17/2021 CLINICAL DATA:  Left-sided abdominal pain for the past 3 weeks. History of mesothelioma, on chemotherapy. EXAM: CT ABDOMEN AND PELVIS WITH CONTRAST TECHNIQUE: Multidetector CT imaging of the abdomen and pelvis was performed using the standard protocol following bolus administration of intravenous contrast. RADIATION DOSE REDUCTION: This exam was performed according to the departmental dose-optimization program which includes automated exposure control, adjustment of the mA and/or kV according to patient size and/or use of iterative reconstruction technique. CONTRAST:  54mL OMNIPAQUE IOHEXOL 300 MG/ML  SOLN COMPARISON:  CT chest dated February 13, 2021. CT abdomen pelvis dated February 05, 2020. FINDINGS: Lower chest: Left pleural thickening and nodularity again noted, also involving the pericardium and left cardiophrenic angle. 3.9 cm nodule adjacent to the left ventricle, previously 2.8 cm.  Hepatobiliary: No focal liver abnormality is seen. No gallstones, gallbladder wall thickening, or biliary dilatation. Pancreas: Unremarkable. No pancreatic ductal dilatation or surrounding inflammatory changes. Spleen: Normal in size without focal abnormality. Adrenals/Urinary Tract: Adrenal glands are unremarkable. Kidneys are normal, without renal calculi, focal lesion, or hydronephrosis. Bladder is unremarkable. Stomach/Bowel: Extensive left-sided  colonic diverticulosis. Small foci of extraluminal air adjacent to the proximal sigmoid colon and nearby small bowel (series 2, images 59, 66, and 69). No colonic wall thickening or surrounding inflammatory changes. Unchanged small hiatal hernia. The stomach and small bowel are otherwise unremarkable. No obstruction. Vascular/Lymphatic: Aortic atherosclerosis. No enlarged abdominal or pelvic lymph nodes. Reproductive: Prior prostatectomy. Other: No free fluid or pneumoperitoneum. Musculoskeletal: No acute or significant osseous findings. IMPRESSION: 1. Sequelae of recent sigmoid diverticulitis related microperforation with small foci of extraluminal air in the left lower quadrant. No active inflammatory changes or colonic wall thickening on the current study. No abscess. 2. Left pleural thickening and nodularity again noted, also involving the pericardium and left cardiophrenic angle, consistent with patient's known mesothelioma, slightly progressed. 3. Aortic Atherosclerosis (ICD10-I70.0). Electronically Signed   By: Titus Dubin M.D.   On: 03/17/2021 15:17    EKG: Independently reviewed.   Assessment/Plan  1. Sigmoid diverticulitis with microperforation  - Presents with 3 wks of lower abdominal pain radiating around towards left scapula and found to have sigmoid diverticulitis with microperforation  - Started on Zosyn in ED  - Continue IVF, IV antibiotics, and pain-control, consider repeat imaging if no improvement in 48-72 hrs   2. Mesothelioma  -  Followed by Dr. Julien Nordmann and undergoing treatment with Avastin   3. Type II DM  - A1c was 8.4% in August 2022  - Check CBGs and use low-intensity SSI for now   4. Hypertension  - Treat as needed only for now    DVT prophylaxis: SCDs  Code Status: Full, discussed with patient on admission  Level of Care: Level of care: Med-Surg Family Communication: none present  Disposition Plan:  Patient is from: home  Anticipated d/c is to: home  Anticipated d/c date is: 03/19/21  Patient currently: Pending management of diverticulitis with contained perforation, advancement of diet  Consults called: none  Admission status: Inpatient     Vianne Bulls, MD [facility]  03/17/2021, 11:20 PM

## 2021-03-17 NOTE — ED Triage Notes (Signed)
Patient here POV from Home with ABD Pain  Pain began approximately 3 weeks PTA and has worsened since. PCP prescribed Flexeril and PT which has not made symptoms better. Patient had Chemotherapy Infusion completed on Thursday and was given IV Steroids which seemed to help but pain has now returned.  Pain is Left ABD but radiates to Left Flank and up Left Back.   History of Mesothelioma. On Chemotherapy for 1.5 years.   NAD Noted during Triage. A&Ox4. GCS 15. Ambulatory.

## 2021-03-17 NOTE — Progress Notes (Signed)
81 year old with past medical history of mesothelioma on chemotherapy comes into the ED for abdominal pain that started 3 weeks prior to admission mainly in lower left abdomen and radiates to his back he relates that he was seen by his primary care doctor prescribed Flexeril with not much improvement received chemotherapy about a week prior to admission along with steroids this helped with the pain but it is just progressively got worse since then denies any nausea vomiting or diarrhea he is passing gas his last bowel movement was 2 days ago. Has remained afebrile in the ED with no leukocytosis.  Kidney function is stable with a calcium of 10.4 CBC shows a white count of 9 with no left shift, UA appears does not show any signs of infection more than thousand white blood cell count CT scan showed microperforation with a small foci of extraluminal air in the left lower quadrant compatible with sigmoid diverticulitis no abscess no inflammatory changes or colonic wall thickening which makes this weird. He was started on IV Zosyn in the ED will admit him to [facility]. He is needed can consult surgery upon arrival.

## 2021-03-17 NOTE — ED Provider Notes (Signed)
[facility] EMERGENCY DEPT Provider Note   CSN: [id number] Arrival date & time: 03/17/21  1057     History  Chief Complaint  Patient presents with   Abdominal Pain    Jack Haynes is a 81 y.o. male.  Patient with history of mesothelioma, on current active chemotherapy --presents to the emergency department for abdominal pain ongoing over the past 3 weeks.  Pain is in the left abdomen.  It radiates to the back and shoulder.  Since arriving here this morning, he has had some mild pain on the right side as well.  Patient was seen by his provider and prescribed Flexeril which has not really helped much.  He did receive chemotherapy last week and associated steroids.  This seemed to delay the pain for several days but since being off the steroids it has returned and progressed.  He has had no associated nausea, vomiting, or diarrhea.  Last bowel movement was 2 days ago.  He states that his urine has been foamy but denies hematuria, dysuria, or increased frequency or urgency.  He has not had pain like this in the past.  He has a history of appendectomy and prostate surgery.  Denies history of diverticulitis.      Home Medications Prior to Admission medications   Medication Sig Start Date End Date Taking? Authorizing Provider  acetaminophen (TYLENOL) 500 MG tablet Take 2 tablets (1,000 mg total) by mouth every 6 (six) hours as needed for mild pain or fever. Patient taking differently: Take 500 mg by mouth every 6 (six) hours as needed for mild pain or fever. 11/15/19   Barrett, Lodema Hong, PA-C  aspirin EC 81 MG tablet Take 81 mg by mouth daily. Swallow whole.    [provider]  calcium carbonate (TUMS - DOSED IN MG ELEMENTAL CALCIUM) 500 MG chewable tablet Chew 1,000 mg by mouth daily as needed for indigestion or heartburn.    [provider]  Cetirizine HCl (ZYRTEC ALLERGY) 10 MG CAPS Zyrtec 10 mg capsule  Take by oral route.    [provider]   cyclobenzaprine (FLEXERIL) 5 MG tablet Take 1 tablet (5 mg total) by mouth 3 (three) times daily as needed for muscle spasms. 03/12/21   Ailene Ards, NP  dexamethasone (DECADRON) 4 MG tablet TAKE 1 TABLET BY MOUTH TWICE DAILY THE DAY BEFORE, DAY OF AND DAY AFTER CHEMOTHERAPY EVERY 3 WEEKS 09/17/20   Curt Bears, MD  empagliflozin (JARDIANCE) 10 MG TABS tablet Take by mouth daily.    [provider]  ferrous sulfate 325 (65 FE) MG tablet Take 325 mg by mouth daily with breakfast.    [provider]  fluconazole (DIFLUCAN) 100 MG tablet Take 1 tablet (100 mg total) by mouth daily. 01/20/21   Curt Bears, MD  folic acid (FOLVITE) 1 MG tablet TAKE 1 TABLET BY MOUTH EVERY DAY 02/11/21   Heilingoetter, Cassandra L, PA-C  lidocaine-prilocaine (EMLA) cream Apply to Port-A-Cath site 30-60-minute before treatment. 09/04/20   Curt Bears, MD  losartan-hydrochlorothiazide [facility]) 100-12.5 MG tablet TAKE 1 TABLET BY MOUTH EVERY DAY 01/03/21   [name], MD  metFORMIN (GLUCOPHAGE) 500 MG tablet TAKE 1 TABLET BY MOUTH 2 TIMES DAILY WITH A MEAL. 03/11/21   [name], MD  mometasone (ELOCON) 0.1 % cream Apply topically 2 (two) times daily. 01/13/21   [provider]  ondansetron (ZOFRAN ODT) 4 MG disintegrating tablet Take 1 tablet (4 mg total) by mouth every 8 (eight) hours  as needed for nausea or vomiting. Starting 3 days after chemotherapy 02/28/20   Heilingoetter, Cassandra L, PA-C  prochlorperazine (COMPAZINE) 10 MG tablet TAKE 1 TABLET BY MOUTH EVERY 6 HOURS AS NEEDED FOR NAUSEA OR VOMITING. 09/17/20   Curt Bears, MD  rosuvastatin (CRESTOR) 20 MG tablet TAKE 1 TABLET BY MOUTH EVERY DAY 08/01/20   O'Neal, Cassie Freer, MD  simethicone (MYLICON) 161 MG chewable tablet Chew 125 mg by mouth daily as needed (gas).    [provider]      Allergies    Patient has no known allergies.    Review of Systems   Review of Systems  Physical  Exam Updated Vital Signs BP (!) 153/98    Pulse (!) 103    Temp 97.9 F (36.6 C)    Resp 18    Ht 5\' 7"  (1.702 m)    Wt 88.9 kg    SpO2 95%    BMI 30.70 kg/m  Physical Exam Vitals and nursing note reviewed.  Constitutional:      General: He is not in acute distress.    Appearance: He is well-developed.  HENT:     Head: Normocephalic and atraumatic.  Eyes:     General:        Right eye: No discharge.        Left eye: No discharge.     Conjunctiva/sclera: Conjunctivae normal.  Cardiovascular:     Rate and Rhythm: Normal rate and regular rhythm.     Heart sounds: Normal heart sounds.  Pulmonary:     Effort: Pulmonary effort is normal.     Breath sounds: Normal breath sounds.  Abdominal:     Palpations: Abdomen is soft.     Tenderness: There is no abdominal tenderness. There is no guarding or rebound. Negative signs include Murphy's sign.     Comments: Patient with tenderness to palpation about the left lateral and left lower abdomen without rebound or guarding.  Dull percussion note throughout except for left upper quadrant which is tympanic.  Musculoskeletal:     Cervical back: Normal range of motion and neck supple.  Skin:    General: Skin is warm and dry.  Neurological:     Mental Status: He is alert.    ED Results / Procedures / Treatments   Labs (all labs ordered are listed, but only abnormal results are displayed) Labs Reviewed  COMPREHENSIVE METABOLIC PANEL - Abnormal; Notable for the following components:      Result Value   Glucose, Bld 142 (*)    Calcium 10.4 (*)    AST 14 (*)    All other components within normal limits  URINALYSIS, ROUTINE W REFLEX MICROSCOPIC - Abnormal; Notable for the following components:   Glucose, UA >1,000 (*)    Protein, ur 30 (*)    All other components within normal limits  RESP PANEL BY RT-PCR (FLU A&B, COVID) ARPGX2  LIPASE, BLOOD  CBC    EKG None  Radiology CT ABDOMEN PELVIS W CONTRAST  Result Date: 03/17/2021 CLINICAL  DATA:  Left-sided abdominal pain for the past 3 weeks. History of mesothelioma, on chemotherapy. EXAM: CT ABDOMEN AND PELVIS WITH CONTRAST TECHNIQUE: Multidetector CT imaging of the abdomen and pelvis was performed using the standard protocol following bolus administration of intravenous contrast. RADIATION DOSE REDUCTION: This exam was performed according to the departmental dose-optimization program which includes automated exposure control, adjustment of the mA and/or kV according to patient size and/or use of iterative reconstruction technique.  CONTRAST:  14mL OMNIPAQUE IOHEXOL 300 MG/ML  SOLN COMPARISON:  CT chest dated February 13, 2021. CT abdomen pelvis dated February 05, 2020. FINDINGS: Lower chest: Left pleural thickening and nodularity again noted, also involving the pericardium and left cardiophrenic angle. 3.9 cm nodule adjacent to the left ventricle, previously 2.8 cm. Hepatobiliary: No focal liver abnormality is seen. No gallstones, gallbladder wall thickening, or biliary dilatation. Pancreas: Unremarkable. No pancreatic ductal dilatation or surrounding inflammatory changes. Spleen: Normal in size without focal abnormality. Adrenals/Urinary Tract: Adrenal glands are unremarkable. Kidneys are normal, without renal calculi, focal lesion, or hydronephrosis. Bladder is unremarkable. Stomach/Bowel: Extensive left-sided colonic diverticulosis. Small foci of extraluminal air adjacent to the proximal sigmoid colon and nearby small bowel (series 2, images 59, 66, and 69). No colonic wall thickening or surrounding inflammatory changes. Unchanged small hiatal hernia. The stomach and small bowel are otherwise unremarkable. No obstruction. Vascular/Lymphatic: Aortic atherosclerosis. No enlarged abdominal or pelvic lymph nodes. Reproductive: Prior prostatectomy. Other: No free fluid or pneumoperitoneum. Musculoskeletal: No acute or significant osseous findings. IMPRESSION: 1. Sequelae of recent sigmoid  diverticulitis related microperforation with small foci of extraluminal air in the left lower quadrant. No active inflammatory changes or colonic wall thickening on the current study. No abscess. 2. Left pleural thickening and nodularity again noted, also involving the pericardium and left cardiophrenic angle, consistent with patient's known mesothelioma, slightly progressed. 3. Aortic Atherosclerosis (ICD10-I70.0). Electronically Signed   By: Titus Dubin M.D.   On: 03/17/2021 15:17    Procedures Procedures    Medications Ordered in ED Medications  piperacillin-tazobactam (ZOSYN) IVPB 3.375 g (3.375 g Intravenous New Bag/Given 03/17/21 1610)  piperacillin-tazobactam (ZOSYN) IVPB 3.375 g (has no administration in time range)  0.9 %  sodium chloride infusion (10 mLs Intravenous New Bag/Given 03/17/21 1609)  0.9 %  sodium chloride infusion (has no administration in time range)  morphine 4 MG/ML injection 4 mg (4 mg Intravenous Given 03/17/21 1302)  ondansetron (ZOFRAN) injection 4 mg (4 mg Intravenous Given 03/17/21 1300)  sodium chloride 0.9 % bolus 500 mL (0 mLs Intravenous Stopped 03/17/21 1353)  iohexol (OMNIPAQUE) 300 MG/ML solution 80 mL (80 mLs Intravenous Contrast Given 03/17/21 1450)    ED Course/ Medical Decision Making/ A&P    Patient seen and examined. History obtained directly from patient and wife who provides additional details about the patient's current symptoms and medical history including surgical history.. Work-up including labs, imaging, EKG ordered in triage, if performed, were reviewed.    Labs/EKG: Independently reviewed and interpreted.  This included: CBC that is normal, urine with glucose and 30 protein, CMP with calcium of 10.4 and glucose of 142, normal lipase.  Imaging: Due to progressive abdominal pain and confounders such as steroid use and chemotherapy --CT abdomen and pelvis ordered.  Medications/Fluids: Ordered: IV morphine, IV Zofran, IV fluid bolus.    Most recent vital signs reviewed and are as follows: BP (!) 153/98    Pulse (!) 103    Temp 97.9 F (36.6 C)    Resp 18    Ht 5\' 7"  (1.702 m)    Wt 88.9 kg    SpO2 95%    BMI 30.70 kg/m   Initial impression: Left-sided abdominal pain, unclear etiology  4:20 PM Reassessment performed. Patient appears comfortable, exam unchanged.  Labs and imaging to this point personally reviewed and interpreted including: CT showing some free air along the colon, question related to recent diverticulitis.    Reviewed pertinent lab work and imaging with patient  at bedside including: CT findings  Most current vital signs reviewed and are as follows: BP (!) 137/109 (BP Location: Right Arm)    Pulse (!) 102    Temp 97.9 F (36.6 C)    Resp 19    Ht 5\' 7"  (1.702 m)    Wt 88.9 kg    SpO2 95%    BMI 30.70 kg/m   Plan: IV antibiotics, admission to hospital.  I consulted with Dr. Aileen Fass, with [facility].  He accepts patient for admission.                           Medical Decision Making Amount and/or Complexity of Data Reviewed Labs: ordered. Radiology: ordered.  Risk Prescription drug management. Decision regarding hospitalization.   Patient with several weeks of left-sided abdominal pain.  There is a couple foci of free air on CT without significant inflammation.  Patient will be admitted for further management and monitoring.          Final Clinical Impression(s) / ED Diagnoses Final diagnoses:  Diverticulitis of colon with perforation    Rx / DC Orders ED Discharge Orders     None         Carlisle Cater, PA-C 85/88/50 2774    Gray, [place], DO 12/87/86 [phone]

## 2021-03-18 LAB — BASIC METABOLIC PANEL
Anion gap: 8 (ref 5–15)
BUN: 23 mg/dL (ref 8–23)
CO2: 28 mmol/L (ref 22–32)
Calcium: 9.3 mg/dL (ref 8.9–10.3)
Chloride: 101 mmol/L (ref 98–111)
Creatinine, Ser: 1.01 mg/dL (ref 0.61–1.24)
GFR, Estimated: >60 mL/min (ref >60)
Glucose, Bld: 108 mg/dL — ABNORMAL HIGH (ref 70–99)
Potassium: 4.9 mmol/L (ref 3.5–5.1)
Sodium: 137 mmol/L (ref 135–145)

## 2021-03-18 LAB — GLUCOSE, CAPILLARY
Glucose-Capillary: 106 mg/dL — ABNORMAL HIGH (ref 70–99)
Glucose-Capillary: 108 mg/dL — ABNORMAL HIGH (ref 70–99)
Glucose-Capillary: 113 mg/dL — ABNORMAL HIGH (ref 70–99)
Glucose-Capillary: 122 mg/dL — ABNORMAL HIGH (ref 70–99)
Glucose-Capillary: 160 mg/dL — ABNORMAL HIGH (ref 70–99)
Glucose-Capillary: 94 mg/dL (ref 70–99)

## 2021-03-18 LAB — CBC
HCT: 42.9 % (ref 39.0–52.0)
Hemoglobin: 13.8 g/dL (ref 13.0–17.0)
MCH: 29.7 pg (ref 26.0–34.0)
MCHC: 32.2 g/dL (ref 30.0–36.0)
MCV: 92.3 fL (ref 80.0–100.0)
Platelets: 166 10*3/uL (ref 150–400)
RBC: 4.65 MIL/uL (ref 4.22–5.81)
RDW: 14.9 % (ref 11.5–15.5)
WBC: 9.8 10*3/uL (ref 4.0–10.5)
nRBC: 0 % (ref 0.0–0.2)

## 2021-03-18 LAB — MAGNESIUM: Magnesium: 2 mg/dL (ref 1.7–2.4)

## 2021-03-18 MED ORDER — ASPIRIN EC 81 MG PO TBEC
81.0000 mg | DELAYED_RELEASE_TABLET | Freq: Every day | ORAL | Status: DC
Start: 2021-03-18 — End: 2021-03-19
  Administered 2021-03-18 – 2021-03-19 (×2): 81 mg via ORAL
  Filled 2021-03-18 (×2): qty 1

## 2021-03-18 MED ORDER — HYDROCHLOROTHIAZIDE 12.5 MG PO TABS
12.5000 mg | ORAL_TABLET | Freq: Every day | ORAL | Status: DC
  Administered 2021-03-18 – 2021-03-19 (×2): 12.5 mg via ORAL
  Filled 2021-03-18 (×2): qty 1

## 2021-03-18 MED ORDER — EMPAGLIFLOZIN 10 MG PO TABS
10.0000 mg | ORAL_TABLET | Freq: Every day | ORAL | Status: DC
  Administered 2021-03-18 – 2021-03-19 (×2): 10 mg via ORAL
  Filled 2021-03-18 (×2): qty 1

## 2021-03-18 MED ORDER — LOSARTAN POTASSIUM-HCTZ 100-12.5 MG PO TABS: 1.0000 | ORAL_TABLET | Freq: Every day | ORAL | Status: DC

## 2021-03-18 MED ORDER — LOSARTAN POTASSIUM 50 MG PO TABS
100.0000 mg | ORAL_TABLET | Freq: Every day | ORAL | Status: DC
  Administered 2021-03-18 – 2021-03-19 (×2): 100 mg via ORAL
  Filled 2021-03-18 (×2): qty 2

## 2021-03-18 MED ORDER — HYDROCODONE-ACETAMINOPHEN 5-325 MG PO TABS
1.0000 | ORAL_TABLET | ORAL | Status: DC | PRN
  Administered 2021-03-18 (×3): 1 via ORAL
  Filled 2021-03-18 (×3): qty 1

## 2021-03-18 MED ORDER — CYCLOBENZAPRINE HCL 5 MG PO TABS: 5.0000 mg | ORAL_TABLET | Freq: Three times a day (TID) | ORAL | Status: DC | PRN

## 2021-03-18 NOTE — Progress Notes (Signed)
PROGRESS NOTE    Jack Haynes  MVH:[id number] DOB: [birth year] DOA: 03/17/2021 PCP: [name], MD    Brief Narrative:  81 year old gentleman with history of mesothelioma, hypertension, type 2 diabetes presented to the ER with 3 weeks of vague abdominal pain, left flank pain and lower quadrant pain worse for 1 day.  In the emergency room, he was hemodynamically stable.  He was found to have contained perforation of the sigmoid diverticulitis.  Started on IV fluids.  IV antibiotics and admitted to the hospital.   Assessment & Plan:   Sigmoid diverticulitis with microperforation: Currently clinically stabilizing.  No evidence of peritonitis.  No evidence of abscess.  WBC count is normal.  Probably occurring for last 3 weeks. Was n.p.o., will start patient on full liquid diet and advance to soft diet. Continue IV fluids today. Continue IV Zosyn, plan to treat with 10 days of oral antibiotic therapy once clinical improvement. Continue IV morphine for pain relief, will start oral Norco in anticipation of transitioning to oral pain medicine on discharge. Colonoscopy 2017, severe sigmoid diverticulosis, first episode of diverticulitis and microperforation.  Currently no other intervention will be needed.  Mesothelioma: Followed by Dr. Earlie Server.  Patient is currently on Avastin.  He has a port looks stable.  Type 2 diabetes: Well-controlled.  On metformin at home.  Resume on discharge.  Currently on sliding scale insulin.  Essential hypertension: Blood pressure fairly stable.  Resume home medications.   DVT prophylaxis: SCDs Start: 03/17/21 2319   Code Status: Full code Family Communication: Wife at the bedside Disposition Plan: Status is: Inpatient Remains inpatient appropriate because: IV antibiotics.  Oral intake challenge.  Planned Discharge Destination: Home           Consultants:  None  Procedures:  None  Antimicrobials:  Zosyn  1/31---   Subjective: Patient was seen and examined.  Wife at the bedside.  No overnight events.  Denies any nausea or vomiting.  He has been trying to eat with poor appetite for the last 3 weeks at home but no vomiting.  Feels slightly bloated on the left side.  Previously more of left back pain treated with Flexeril with no improvement.  Afebrile.  Last bowel movement 2 days ago and was normal.  Objective: Vitals:   03/17/21 2000 03/17/21 2040 03/18/21 0126 03/18/21 0412  BP: (!) 125/95 131/87 114/87 117/84  Pulse: (!) 102 99 91 90  Resp: 18 16 20 18   Temp:  (!) 97.4 F (36.3 C) (!) 97.4 F (36.3 C) (!) 97.5 F (36.4 C)  TempSrc:  Oral Oral Oral  SpO2: 94% 94% 93% 94%  Weight:      Height:        Intake/Output Summary (Last 24 hours) at 03/18/2021 0811 Last data filed at 03/18/2021 0809 Gross per 24 hour  Intake 937.91 ml  Output --  Net 937.91 ml   Filed Weights   03/17/21 1127  Weight: 88.9 kg    Examination:  General exam: Appears calm and comfortable  Respiratory system: Clear to auscultation. Respiratory effort normal. Implanted port left chest wall, nontender. Cardiovascular system: S1 & S2 heard, RRR. No JVD, murmurs, rubs, gallops or clicks. No pedal edema. Gastrointestinal system: Soft.  Nontender.  Bowel sounds present.  No rigidity, localized tenderness or guarding. Central nervous system: Alert and oriented. No focal neurological deficits. Extremities: Symmetric 5 x 5 power. Skin: No rashes, lesions or ulcers Psychiatry: Judgement and insight appear normal. Mood & affect appropriate.  Data Reviewed: I have personally reviewed following labs and imaging studies  CBC: Recent Labs  Lab 03/12/21 0744 03/17/21 1141 03/18/21 0539  WBC 6.9 9.7 9.8  NEUTROABS 5.9  --   --   HGB 12.8* 15.2 13.8  HCT 38.4* 46.2 42.9  MCV 88.7 89.0 92.3  PLT 184 204 694   Basic Metabolic Panel: Recent Labs  Lab 03/12/21 0744 03/17/21 1141 03/18/21 0539  NA 138  138 137  K 4.4 4.0 4.9  CL 102 99 101  CO2 26 26 28   GLUCOSE 207* 142* 108*  BUN 19 21 23   CREATININE 1.19 1.16 1.01  CALCIUM 9.3 10.4* 9.3  MG  --   --  2.0   GFR: Estimated Creatinine Clearance: 62 mL/min (by C-G formula based on SCr of 1.01 mg/dL). Liver Function Tests: Recent Labs  Lab 03/12/21 0744 03/17/21 1141  AST 17 14*  ALT 10 15  ALKPHOS 48 57  BILITOT 0.5 0.5  PROT 7.1 7.4  ALBUMIN 4.2 4.5   Recent Labs  Lab 03/17/21 1141  LIPASE 37   No results for input(s): AMMONIA in the last 168 hours. Coagulation Profile: No results for input(s): INR, PROTIME in the last 168 hours. Cardiac Enzymes: No results for input(s): CKTOTAL, CKMB, CKMBINDEX, TROPONINI in the last 168 hours. BNP (last 3 results) No results for input(s): PROBNP in the last 8760 hours. HbA1C: No results for input(s): HGBA1C in the last 72 hours. CBG: Recent Labs  Lab 03/17/21 2330 03/18/21 0415 03/18/21 0747  GLUCAP 114* 106* 94   Lipid Profile: No results for input(s): CHOL, HDL, LDLCALC, TRIG, CHOLHDL, LDLDIRECT in the last 72 hours. Thyroid Function Tests: No results for input(s): TSH, T4TOTAL, FREET4, T3FREE, THYROIDAB in the last 72 hours. Anemia Panel: No results for input(s): VITAMINB12, FOLATE, FERRITIN, TIBC, IRON, RETICCTPCT in the last 72 hours. Sepsis Labs: No results for input(s): PROCALCITON, LATICACIDVEN in the last 168 hours.  Recent Results (from the past 240 hour(s))  Resp Panel by RT-PCR (Flu A&B, Covid) Nasopharyngeal Swab     Status: None   Collection Time: 03/17/21  3:28 PM   Specimen: Nasopharyngeal Swab; Nasopharyngeal(NP) swabs in vial transport medium  Result Value Ref Range Status   SARS Coronavirus 2 by RT PCR NEGATIVE NEGATIVE Final    Comment: (NOTE) SARS-CoV-2 target nucleic acids are NOT DETECTED.  The SARS-CoV-2 RNA is generally detectable in upper respiratory specimens during the acute phase of infection. The lowest concentration of SARS-CoV-2  viral copies this assay can detect is 138 copies/mL. A negative result does not preclude SARS-Cov-2 infection and should not be used as the sole basis for treatment or other patient management decisions. A negative result may occur with  improper specimen collection/handling, submission of specimen other than nasopharyngeal swab, presence of viral mutation(s) within the areas targeted by this assay, and inadequate number of viral copies(<138 copies/mL). A negative result must be combined with clinical observations, patient history, and epidemiological information. The expected result is Negative.  Fact Sheet for Patients:  EntrepreneurPulse.com.au  Fact Sheet for Healthcare Providers:  IncredibleEmployment.be  This test is no t yet approved or cleared by the [place] FDA and  has been authorized for detection and/or diagnosis of SARS-CoV-2 by FDA under an Emergency Use Authorization (EUA). This EUA will remain  in effect (meaning this test can be used) for the duration of the COVID-19 declaration under Section 564(b)(1) of the Act, 21 U.S.C.section 360bbb-3(b)(1), unless the authorization is terminated  or revoked  sooner.       Influenza A by PCR NEGATIVE NEGATIVE Final   Influenza B by PCR NEGATIVE NEGATIVE Final    Comment: (NOTE) The Xpert Xpress SARS-CoV-2/FLU/RSV plus assay is intended as an aid in the diagnosis of influenza from Nasopharyngeal swab specimens and should not be used as a sole basis for treatment. Nasal washings and aspirates are unacceptable for Xpert Xpress SARS-CoV-2/FLU/RSV testing.  Fact Sheet for Patients: EntrepreneurPulse.com.au  Fact Sheet for Healthcare Providers: IncredibleEmployment.be  This test is not yet approved or cleared by the [place] FDA and has been authorized for detection and/or diagnosis of SARS-CoV-2 by FDA under an Emergency Use Authorization  (EUA). This EUA will remain in effect (meaning this test can be used) for the duration of the COVID-19 declaration under Section 564(b)(1) of the Act, 21 U.S.C. section 360bbb-3(b)(1), unless the authorization is terminated or revoked.  Performed at [facility], [address], [place], [place] [address]          Radiology Studies: CT ABDOMEN PELVIS W CONTRAST  Result Date: 03/17/2021 CLINICAL DATA:  Left-sided abdominal pain for the past 3 weeks. History of mesothelioma, on chemotherapy. EXAM: CT ABDOMEN AND PELVIS WITH CONTRAST TECHNIQUE: Multidetector CT imaging of the abdomen and pelvis was performed using the standard protocol following bolus administration of intravenous contrast. RADIATION DOSE REDUCTION: This exam was performed according to the departmental dose-optimization program which includes automated exposure control, adjustment of the mA and/or kV according to patient size and/or use of iterative reconstruction technique. CONTRAST:  22mL OMNIPAQUE IOHEXOL 300 MG/ML  SOLN COMPARISON:  CT chest dated February 13, 2021. CT abdomen pelvis dated February 05, 2020. FINDINGS: Lower chest: Left pleural thickening and nodularity again noted, also involving the pericardium and left cardiophrenic angle. 3.9 cm nodule adjacent to the left ventricle, previously 2.8 cm. Hepatobiliary: No focal liver abnormality is seen. No gallstones, gallbladder wall thickening, or biliary dilatation. Pancreas: Unremarkable. No pancreatic ductal dilatation or surrounding inflammatory changes. Spleen: Normal in size without focal abnormality. Adrenals/Urinary Tract: Adrenal glands are unremarkable. Kidneys are normal, without renal calculi, focal lesion, or hydronephrosis. Bladder is unremarkable. Stomach/Bowel: Extensive left-sided colonic diverticulosis. Small foci of extraluminal air adjacent to the proximal sigmoid colon and nearby small bowel (series 2, images 59, 66, and 69). No colonic  wall thickening or surrounding inflammatory changes. Unchanged small hiatal hernia. The stomach and small bowel are otherwise unremarkable. No obstruction. Vascular/Lymphatic: Aortic atherosclerosis. No enlarged abdominal or pelvic lymph nodes. Reproductive: Prior prostatectomy. Other: No free fluid or pneumoperitoneum. Musculoskeletal: No acute or significant osseous findings. IMPRESSION: 1. Sequelae of recent sigmoid diverticulitis related microperforation with small foci of extraluminal air in the left lower quadrant. No active inflammatory changes or colonic wall thickening on the current study. No abscess. 2. Left pleural thickening and nodularity again noted, also involving the pericardium and left cardiophrenic angle, consistent with patient's known mesothelioma, slightly progressed. 3. Aortic Atherosclerosis (ICD10-I70.0). Electronically Signed   By: Titus Dubin M.D.   On: 03/17/2021 15:17        Scheduled Meds:  fluconazole  100 mg Oral QHS   insulin aspart  0-6 Units Subcutaneous Q4H   pantoprazole  40 mg Oral Daily   rosuvastatin  20 mg Oral QHS   Continuous Infusions:  piperacillin-tazobactam (ZOSYN)  IV 12.5 mL/hr at 03/18/21 0809     LOS: 1 day    Time spent: 35 minutes    Barb Merino, MD [facility] Pager [phone]

## 2021-03-18 NOTE — Progress Notes (Signed)
°  Transition of Care [facility]) Screening Note   Patient Details  Name: Jack Haynes Date of Birth: 01-27-1941   Transition of Care [facility]) CM/SW Contact:    Veryl Abril, Marjie Skiff, RN Phone Number: 03/18/2021, 1:56 PM    Transition of Care Department [facility]) has reviewed patient and no TOC needs have been identified at this time. We will continue to monitor patient advancement through interdisciplinary progression rounds. If new patient transition needs arise, please place a TOC consult.

## 2021-03-19 LAB — GLUCOSE, CAPILLARY
Glucose-Capillary: 105 mg/dL — ABNORMAL HIGH (ref 70–99)
Glucose-Capillary: 101 mg/dL — ABNORMAL HIGH (ref 70–99)
Glucose-Capillary: 108 mg/dL — ABNORMAL HIGH (ref 70–99)

## 2021-03-19 LAB — BASIC METABOLIC PANEL
Anion gap: 7 (ref 5–15)
BUN: 18 mg/dL (ref 8–23)
CO2: 29 mmol/L (ref 22–32)
Calcium: 9 mg/dL (ref 8.9–10.3)
Chloride: 97 mmol/L — ABNORMAL LOW (ref 98–111)
Creatinine, Ser: 1.2 mg/dL (ref 0.61–1.24)
GFR, Estimated: >60 mL/min (ref >60)
Glucose, Bld: 118 mg/dL — ABNORMAL HIGH (ref 70–99)
Potassium: 4.1 mmol/L (ref 3.5–5.1)
Sodium: 133 mmol/L — ABNORMAL LOW (ref 135–145)

## 2021-03-19 LAB — CBC
HCT: 39.9 % (ref 39.0–52.0)
Hemoglobin: 13.3 g/dL (ref 13.0–17.0)
MCH: 30.5 pg (ref 26.0–34.0)
MCHC: 33.3 g/dL (ref 30.0–36.0)
MCV: 91.5 fL (ref 80.0–100.0)
Platelets: 151 10*3/uL (ref 150–400)
RBC: 4.36 MIL/uL (ref 4.22–5.81)
RDW: 14.8 % (ref 11.5–15.5)
WBC: 7.1 10*3/uL (ref 4.0–10.5)
nRBC: 0 % (ref 0.0–0.2)

## 2021-03-19 MED ORDER — HYDROCODONE-ACETAMINOPHEN 5-325 MG PO TABS: 1.0000 | ORAL_TABLET | Freq: Four times a day (QID) | ORAL | 0 refills | Status: DC | PRN

## 2021-03-19 MED ORDER — AMOXICILLIN-POT CLAVULANATE 875-125 MG PO TABS: 1.0000 | ORAL_TABLET | Freq: Two times a day (BID) | ORAL | 0 refills | Status: AC

## 2021-03-19 NOTE — Discharge Planning (Signed)
[id number] Patients medication and discharge instructions provided to patient and appt list as well. Iv taken out for discharge and patient walked down to wife for transport

## 2021-03-19 NOTE — Discharge Summary (Signed)
Physician Discharge Summary  Jack Haynes NKN:[id number] DOB: May 12, 1940 DOA: 03/17/2021  PCP: [name], MD  Admit date: 03/17/2021 Discharge date: 03/19/2021  Admitted From: Home Disposition:  home   Recommendations for Outpatient Follow-up:  Follow up with PCP in 1-2 weeks Gastroenterology  Home Health: N/A Equipment/Devices: N/A  Discharge Condition: Stable CODE STATUS: Full code Diet recommendation: Low-salt diet.  Soft diet.  Discharge summary: 81 year old gentleman with history of mesothelioma, hypertension, type 2 diabetes presented to the ER with 3 weeks of vague abdominal pain, left flank pain and lower quadrant pain worse for 1 day.  In the emergency room, he was hemodynamically stable.  He was found to have contained perforation of the sigmoid diverticulitis.  Started on IV fluids.  IV antibiotics and admitted to the hospital.  Sigmoid diverticulitis with microperforation: Currently clinically stabilizing.  No evidence of peritonitis.  No evidence of abscess.  WBC count is normal.  Probably occurring for last 3 weeks. Tolerating soft diet.  Treated with IV Zosyn.  Pain controlled on oral pain medications. With clinical improvement, will discharge him home with Augmentin for 12 days, total 2 weeks of antibiotic therapy. Colonoscopy 2017, severe sigmoid diverticulosis.  Past episode of diverticulitis and microperforation.  Currently stable.  Conservative management anticipated. We will send to gastroenterology for follow-up.   Mesothelioma: Followed by Dr. Earlie Server.  Patient is currently on Avastin.  He has a port looks stable.   Type 2 diabetes: Well-controlled.  On metformin at home.  Resume on discharge.  Currently on sliding scale insulin.  Essential hypertension: Blood pressure fairly stable.  Resume home medications.  Patient is medically stabilized.  He is tolerating soft diet.  He had used 2 doses of Norco last 24 hours for mild to moderate left sided  abdominal pain.  Able to discharge home with oral antibiotics.  He may not need any procedures, however will send a follow-up referral to gastroenterology.    Discharge Diagnoses:  Principal Problem:   Diverticulitis of colon with perforation Active Problems:   Essential hypertension   Mesothelioma of left lung ([place])   Type 2 diabetes mellitus with hyperglycemia, without long-term current use of insulin [facility])    Discharge Instructions  Discharge Instructions     Ambulatory referral to Gastroenterology   Complete by: As directed    What is the reason for referral?: Other Comment - follow up   Call MD for:  persistant nausea and vomiting   Complete by: As directed    Call MD for:  severe uncontrolled pain   Complete by: As directed    Diet - low sodium heart healthy   Complete by: As directed    Increase activity slowly   Complete by: As directed       Allergies as of 03/19/2021   No Known Allergies      Medication List     STOP taking these medications    fluconazole 100 MG tablet Commonly known as: DIFLUCAN       TAKE these medications    acetaminophen 500 MG tablet Commonly known as: TYLENOL Take 2 tablets (1,000 mg total) by mouth every 6 (six) hours as needed for mild pain or fever. What changed: how much to take   amoxicillin-clavulanate 875-125 MG tablet Commonly known as: Augmentin Take 1 tablet by mouth 2 (two) times daily for 12 days.   aspirin EC 81 MG tablet Take 81 mg by mouth daily. Swallow whole.   calcium carbonate 500 MG chewable  tablet Commonly known as: TUMS - dosed in mg elemental calcium Chew 1,000 mg by mouth daily as needed for indigestion or heartburn.   cyclobenzaprine 5 MG tablet Commonly known as: FLEXERIL Take 1 tablet (5 mg total) by mouth 3 (three) times daily as needed for muscle spasms.   dexamethasone 4 MG tablet Commonly known as: DECADRON TAKE 1 TABLET BY MOUTH TWICE DAILY THE DAY BEFORE, DAY OF AND DAY AFTER  CHEMOTHERAPY EVERY 3 WEEKS   empagliflozin 10 MG Tabs tablet Commonly known as: JARDIANCE Take by mouth daily.   ferrous sulfate 325 (65 FE) MG tablet Take 325 mg by mouth daily with breakfast.   folic acid 1 MG tablet Commonly known as: FOLVITE TAKE 1 TABLET BY MOUTH EVERY DAY   HYDROcodone-acetaminophen 5-325 MG tablet Commonly known as: NORCO/VICODIN Take 1 tablet by mouth every 6 (six) hours as needed for up to 5 days for moderate pain.   lidocaine-prilocaine cream Commonly known as: EMLA Apply to Port-A-Cath site 30-60-minute before treatment.   losartan-hydrochlorothiazide 100-12.5 MG tablet Commonly known as: HYZAAR TAKE 1 TABLET BY MOUTH EVERY DAY   metFORMIN 500 MG tablet Commonly known as: GLUCOPHAGE TAKE 1 TABLET BY MOUTH 2 TIMES DAILY WITH A MEAL.   mometasone 0.1 % cream Commonly known as: ELOCON Apply topically 2 (two) times daily.   omeprazole 20 MG capsule Commonly known as: PRILOSEC Take 20 mg by mouth daily.   ondansetron 4 MG disintegrating tablet Commonly known as: Zofran ODT Take 1 tablet (4 mg total) by mouth every 8 (eight) hours as needed for nausea or vomiting. Starting 3 days after chemotherapy   prochlorperazine 10 MG tablet Commonly known as: COMPAZINE TAKE 1 TABLET BY MOUTH EVERY 6 HOURS AS NEEDED FOR NAUSEA OR VOMITING.   rosuvastatin 20 MG tablet Commonly known as: CRESTOR TAKE 1 TABLET BY MOUTH EVERY DAY   simethicone 125 MG chewable tablet Commonly known as: MYLICON Chew 638 mg by mouth daily as needed (gas).   ZyrTEC Allergy 10 MG Caps Generic drug: Cetirizine HCl Zyrtec 10 mg capsule  Take by oral route.        Follow-up Information     [name], MD Follow up in 2 week(s).   Specialty: Internal Medicine Contact information: [place] [address] [phone]                No Known Allergies  Consultations: None   Procedures/Studies: DG Thoracic Spine 2 View  Result  Date: 02/27/2021 CLINICAL DATA:  Back pain EXAM: THORACIC SPINE 2 VIEWS COMPARISON:  None. FINDINGS: Alignment is within normal limits. Confluent flowing anterior osteophytes consistent with diffuse idiopathic skeletal hyperostosis. No vertebral body height loss. Left chest port terminates in the region the upper superior vena cava. Left basilar lung opacity consistent with pleural nodularity seen on recent chest CT. IMPRESSION: Mild multilevel degenerative changes of the thoracic spine. Electronically Signed   By: Miachel Roux M.D.   On: 02/27/2021 06:54   CT ABDOMEN PELVIS W CONTRAST  Result Date: 03/17/2021 CLINICAL DATA:  Left-sided abdominal pain for the past 3 weeks. History of mesothelioma, on chemotherapy. EXAM: CT ABDOMEN AND PELVIS WITH CONTRAST TECHNIQUE: Multidetector CT imaging of the abdomen and pelvis was performed using the standard protocol following bolus administration of intravenous contrast. RADIATION DOSE REDUCTION: This exam was performed according to the departmental dose-optimization program which includes automated exposure control, adjustment of the mA and/or kV according to patient size and/or use of iterative reconstruction  technique. CONTRAST:  68mL OMNIPAQUE IOHEXOL 300 MG/ML  SOLN COMPARISON:  CT chest dated February 13, 2021. CT abdomen pelvis dated February 05, 2020. FINDINGS: Lower chest: Left pleural thickening and nodularity again noted, also involving the pericardium and left cardiophrenic angle. 3.9 cm nodule adjacent to the left ventricle, previously 2.8 cm. Hepatobiliary: No focal liver abnormality is seen. No gallstones, gallbladder wall thickening, or biliary dilatation. Pancreas: Unremarkable. No pancreatic ductal dilatation or surrounding inflammatory changes. Spleen: Normal in size without focal abnormality. Adrenals/Urinary Tract: Adrenal glands are unremarkable. Kidneys are normal, without renal calculi, focal lesion, or hydronephrosis. Bladder is unremarkable.  Stomach/Bowel: Extensive left-sided colonic diverticulosis. Small foci of extraluminal air adjacent to the proximal sigmoid colon and nearby small bowel (series 2, images 59, 66, and 69). No colonic wall thickening or surrounding inflammatory changes. Unchanged small hiatal hernia. The stomach and small bowel are otherwise unremarkable. No obstruction. Vascular/Lymphatic: Aortic atherosclerosis. No enlarged abdominal or pelvic lymph nodes. Reproductive: Prior prostatectomy. Other: No free fluid or pneumoperitoneum. Musculoskeletal: No acute or significant osseous findings. IMPRESSION: 1. Sequelae of recent sigmoid diverticulitis related microperforation with small foci of extraluminal air in the left lower quadrant. No active inflammatory changes or colonic wall thickening on the current study. No abscess. 2. Left pleural thickening and nodularity again noted, also involving the pericardium and left cardiophrenic angle, consistent with patient's known mesothelioma, slightly progressed. 3. Aortic Atherosclerosis (ICD10-I70.0). Electronically Signed   By: Titus Dubin M.D.   On: 03/17/2021 15:17   (Echo, Carotid, EGD, Colonoscopy, ERCP)    Subjective: Patient seen and examined.  Wife at the bedside.  Eager to go home.  He feels fine.  He had mild pain last night, he used a dose of Norco so that could help him to sleep.  Denies any nausea or vomiting.  Passing flatus.  No bowel movement since last 24 hours.   Discharge Exam: Vitals:   03/18/21 2010 03/19/21 0409  BP: 114/85 121/81  Pulse: 87 84  Resp: 16 18  Temp: 98.1 F (36.7 C) 97.7 F (36.5 C)  SpO2: 96% 98%   Vitals:   03/18/21 0412 03/18/21 1229 03/18/21 2010 03/19/21 0409  BP: 117/84 (!) 132/92 114/85 121/81  Pulse: 90 91 87 84  Resp: 18 14 16 18   Temp: (!) 97.5 F (36.4 C) 97.7 F (36.5 C) 98.1 F (36.7 C) 97.7 F (36.5 C)  TempSrc: Oral Oral Oral Oral  SpO2: 94% 97% 96% 98%  Weight:      Height:        General: Pt is alert,  awake, not in acute distress Cardiovascular: RRR, S1/S2 +, no rubs, no gallops, he has a port in place left precordium.  Nontender. Respiratory: CTA bilaterally, no wheezing, no rhonchi Abdominal: Soft, NT, ND, bowel sounds +, no localized tenderness or rigidity or guarding. Extremities: no edema, no cyanosis    The results of significant diagnostics from this hospitalization (including imaging, microbiology, ancillary and laboratory) are listed below for reference.     Microbiology: Recent Results (from the past 240 hour(s))  Resp Panel by RT-PCR (Flu A&B, Covid) Nasopharyngeal Swab     Status: None   Collection Time: 03/17/21  3:28 PM   Specimen: Nasopharyngeal Swab; Nasopharyngeal(NP) swabs in vial transport medium  Result Value Ref Range Status   SARS Coronavirus 2 by RT PCR NEGATIVE NEGATIVE Final    Comment: (NOTE) SARS-CoV-2 target nucleic acids are NOT DETECTED.  The SARS-CoV-2 RNA is generally detectable in upper respiratory specimens  during the acute phase of infection. The lowest concentration of SARS-CoV-2 viral copies this assay can detect is 138 copies/mL. A negative result does not preclude SARS-Cov-2 infection and should not be used as the sole basis for treatment or other patient management decisions. A negative result may occur with  improper specimen collection/handling, submission of specimen other than nasopharyngeal swab, presence of viral mutation(s) within the areas targeted by this assay, and inadequate number of viral copies(<138 copies/mL). A negative result must be combined with clinical observations, patient history, and epidemiological information. The expected result is Negative.  Fact Sheet for Patients:  EntrepreneurPulse.com.au  Fact Sheet for Healthcare Providers:  IncredibleEmployment.be  This test is no t yet approved or cleared by the [place] FDA and  has been authorized for detection and/or  diagnosis of SARS-CoV-2 by FDA under an Emergency Use Authorization (EUA). This EUA will remain  in effect (meaning this test can be used) for the duration of the COVID-19 declaration under Section 564(b)(1) of the Act, 21 U.S.C.section 360bbb-3(b)(1), unless the authorization is terminated  or revoked sooner.       Influenza A by PCR NEGATIVE NEGATIVE Final   Influenza B by PCR NEGATIVE NEGATIVE Final    Comment: (NOTE) The Xpert Xpress SARS-CoV-2/FLU/RSV plus assay is intended as an aid in the diagnosis of influenza from Nasopharyngeal swab specimens and should not be used as a sole basis for treatment. Nasal washings and aspirates are unacceptable for Xpert Xpress SARS-CoV-2/FLU/RSV testing.  Fact Sheet for Patients: EntrepreneurPulse.com.au  Fact Sheet for Healthcare Providers: IncredibleEmployment.be  This test is not yet approved or cleared by the [place] FDA and has been authorized for detection and/or diagnosis of SARS-CoV-2 by FDA under an Emergency Use Authorization (EUA). This EUA will remain in effect (meaning this test can be used) for the duration of the COVID-19 declaration under Section 564(b)(1) of the Act, 21 U.S.C. section 360bbb-3(b)(1), unless the authorization is terminated or revoked.  Performed at [facility], [address] [place], [place] [address]      Labs: BNP (last 3 results) No results for input(s): BNP in the last 8760 hours. Basic Metabolic Panel: Recent Labs  Lab 03/17/21 1141 03/18/21 0539 03/19/21 0541  NA 138 137 133*  K 4.0 4.9 4.1  CL 99 101 97*  CO2 26 28 29   GLUCOSE 142* 108* 118*  BUN 21 23 18   CREATININE 1.16 1.01 1.20  CALCIUM 10.4* 9.3 9.0  MG  --  2.0  --    Liver Function Tests: Recent Labs  Lab 03/17/21 1141  AST 14*  ALT 15  ALKPHOS 57  BILITOT 0.5  PROT 7.4  ALBUMIN 4.5   Recent Labs  Lab 03/17/21 1141  LIPASE 37   No results for  input(s): AMMONIA in the last 168 hours. CBC: Recent Labs  Lab 03/17/21 1141 03/18/21 0539 03/19/21 0541  WBC 9.7 9.8 7.1  HGB 15.2 13.8 13.3  HCT 46.2 42.9 39.9  MCV 89.0 92.3 91.5  PLT 204 166 151   Cardiac Enzymes: No results for input(s): CKTOTAL, CKMB, CKMBINDEX, TROPONINI in the last 168 hours. BNP: Invalid input(s): POCBNP CBG: Recent Labs  Lab 03/18/21 2013 03/18/21 2343 03/19/21 0411 03/19/21 0724 03/19/21 1132  GLUCAP 160* 113* 105* 101* 108*   D-Dimer No results for input(s): DDIMER in the last 72 hours. Hgb A1c No results for input(s): HGBA1C in the last 72 hours. Lipid Profile No results for input(s): CHOL, HDL, LDLCALC, TRIG, CHOLHDL, LDLDIRECT  in the last 72 hours. Thyroid function studies No results for input(s): TSH, T4TOTAL, T3FREE, THYROIDAB in the last 72 hours.  Invalid input(s): FREET3 Anemia work up No results for input(s): VITAMINB12, FOLATE, FERRITIN, TIBC, IRON, RETICCTPCT in the last 72 hours. Urinalysis    Component Value Date/Time   COLORURINE YELLOW 03/17/2021 1141   APPEARANCEUR CLEAR 03/17/2021 1141   LABSPEC 1.024 03/17/2021 1141   PHURINE 5.5 03/17/2021 1141   GLUCOSEU >1,000 (A) 03/17/2021 1141   HGBUR NEGATIVE 03/17/2021 1141   BILIRUBINUR NEGATIVE 03/17/2021 1141   BILIRUBINUR negative 02/26/2021 1632   KETONESUR NEGATIVE 03/17/2021 1141   PROTEINUR 30 (A) 03/17/2021 1141   UROBILINOGEN 1.0 02/26/2021 1632   UROBILINOGEN 0.2 01/27/2012 0818   NITRITE NEGATIVE 03/17/2021 1141   LEUKOCYTESUR NEGATIVE 03/17/2021 1141   Sepsis Labs Invalid input(s): PROCALCITONIN,  WBC,  LACTICIDVEN Microbiology Recent Results (from the past 240 hour(s))  Resp Panel by RT-PCR (Flu A&B, Covid) Nasopharyngeal Swab     Status: None   Collection Time: 03/17/21  3:28 PM   Specimen: Nasopharyngeal Swab; Nasopharyngeal(NP) swabs in vial transport medium  Result Value Ref Range Status   SARS Coronavirus 2 by RT PCR NEGATIVE NEGATIVE Final     Comment: (NOTE) SARS-CoV-2 target nucleic acids are NOT DETECTED.  The SARS-CoV-2 RNA is generally detectable in upper respiratory specimens during the acute phase of infection. The lowest concentration of SARS-CoV-2 viral copies this assay can detect is 138 copies/mL. A negative result does not preclude SARS-Cov-2 infection and should not be used as the sole basis for treatment or other patient management decisions. A negative result may occur with  improper specimen collection/handling, submission of specimen other than nasopharyngeal swab, presence of viral mutation(s) within the areas targeted by this assay, and inadequate number of viral copies(<138 copies/mL). A negative result must be combined with clinical observations, patient history, and epidemiological information. The expected result is Negative.  Fact Sheet for Patients:  EntrepreneurPulse.com.au  Fact Sheet for Healthcare Providers:  IncredibleEmployment.be  This test is no t yet approved or cleared by the [place] FDA and  has been authorized for detection and/or diagnosis of SARS-CoV-2 by FDA under an Emergency Use Authorization (EUA). This EUA will remain  in effect (meaning this test can be used) for the duration of the COVID-19 declaration under Section 564(b)(1) of the Act, 21 U.S.C.section 360bbb-3(b)(1), unless the authorization is terminated  or revoked sooner.       Influenza A by PCR NEGATIVE NEGATIVE Final   Influenza B by PCR NEGATIVE NEGATIVE Final    Comment: (NOTE) The Xpert Xpress SARS-CoV-2/FLU/RSV plus assay is intended as an aid in the diagnosis of influenza from Nasopharyngeal swab specimens and should not be used as a sole basis for treatment. Nasal washings and aspirates are unacceptable for Xpert Xpress SARS-CoV-2/FLU/RSV testing.  Fact Sheet for Patients: EntrepreneurPulse.com.au  Fact Sheet for Healthcare  Providers: IncredibleEmployment.be  This test is not yet approved or cleared by the [place] FDA and has been authorized for detection and/or diagnosis of SARS-CoV-2 by FDA under an Emergency Use Authorization (EUA). This EUA will remain in effect (meaning this test can be used) for the duration of the COVID-19 declaration under Section 564(b)(1) of the Act, 21 U.S.C. section 360bbb-3(b)(1), unless the authorization is terminated or revoked.  Performed at [facility], [address] [place], [place] [address]      Time coordinating discharge: 40 minutes  SIGNED:   Barb Merino, MD  [facility]  03/19/2021, 12:01 PM

## 2021-03-20 ENCOUNTER — Encounter: Payer: Self-pay | Admitting: Internal Medicine

## 2021-03-20 ENCOUNTER — Other Ambulatory Visit: Payer: Self-pay | Admitting: Internal Medicine

## 2021-03-20 ENCOUNTER — Other Ambulatory Visit (HOSPITAL_COMMUNITY): Payer: Self-pay

## 2021-03-20 ENCOUNTER — Telehealth: Payer: Self-pay | Admitting: Medical Oncology

## 2021-03-20 MED ORDER — HYDROCODONE-ACETAMINOPHEN 5-325 MG PO TABS
1.0000 | ORAL_TABLET | Freq: Four times a day (QID) | ORAL | 0 refills | Status: AC | PRN
  Filled 2021-03-20: qty 20, 5d supply, fill #0

## 2021-03-20 NOTE — Telephone Encounter (Signed)
Pain medication is not available at  [facility].   Dr Sloan Leiter  sent a new rx to [facility].   I called [facility] and told them to cancel his Vicodin rx.  Beverly notified to pick up at [facility] and not go  to [facility].

## 2021-03-23 ENCOUNTER — Telehealth: Payer: Self-pay

## 2021-03-23 NOTE — Telephone Encounter (Signed)
No tcm needed

## 2021-03-28 ENCOUNTER — Other Ambulatory Visit: Payer: Self-pay

## 2021-03-28 ENCOUNTER — Emergency Department (HOSPITAL_BASED_OUTPATIENT_CLINIC_OR_DEPARTMENT_OTHER): Payer: PPO

## 2021-03-28 ENCOUNTER — Emergency Department (HOSPITAL_BASED_OUTPATIENT_CLINIC_OR_DEPARTMENT_OTHER)
Admission: EM | Admit: 2021-03-28 | Discharge: 2021-03-28 | Disposition: A | Discharge status: Home / Self Care | Payer: PPO | Attending: Emergency Medicine | Admitting: Emergency Medicine

## 2021-03-28 ENCOUNTER — Encounter (HOSPITAL_BASED_OUTPATIENT_CLINIC_OR_DEPARTMENT_OTHER): Payer: Self-pay

## 2021-03-28 DIAGNOSIS — R1032 Left lower quadrant pain: Secondary | ICD-10-CM | POA: Diagnosis not present

## 2021-03-28 DIAGNOSIS — Z8529 Personal history of malignant neoplasm of other respiratory and intrathoracic organs: Secondary | ICD-10-CM | POA: Diagnosis not present

## 2021-03-28 DIAGNOSIS — R109 Unspecified abdominal pain: Secondary | ICD-10-CM | POA: Diagnosis not present

## 2021-03-28 DIAGNOSIS — K5900 Constipation, unspecified: Secondary | ICD-10-CM | POA: Insufficient documentation

## 2021-03-28 DIAGNOSIS — Z7982 Long term (current) use of aspirin: Secondary | ICD-10-CM | POA: Diagnosis not present

## 2021-03-28 DIAGNOSIS — R630 Anorexia: Secondary | ICD-10-CM | POA: Insufficient documentation

## 2021-03-28 DIAGNOSIS — I700 Atherosclerosis of aorta: Secondary | ICD-10-CM | POA: Diagnosis not present

## 2021-03-28 LAB — URINALYSIS, ROUTINE W REFLEX MICROSCOPIC
Bilirubin Urine: NEGATIVE
Glucose, UA: >1000 mg/dL — AB
Hgb urine dipstick: NEGATIVE
Ketones, ur: NEGATIVE mg/dL
Leukocytes,Ua: NEGATIVE
Nitrite: NEGATIVE
Protein, ur: 30 mg/dL — AB
Specific Gravity, Urine: 1.034 — ABNORMAL HIGH (ref 1.005–1.030)
pH: 6 (ref 5.0–8.0)

## 2021-03-28 LAB — COMPREHENSIVE METABOLIC PANEL
ALT: 9 U/L (ref 0–44)
AST: 15 U/L (ref 15–41)
Albumin: 4.3 g/dL (ref 3.5–5.0)
Alkaline Phosphatase: 48 U/L (ref 38–126)
Anion gap: 9 (ref 5–15)
BUN: 20 mg/dL (ref 8–23)
CO2: 30 mmol/L (ref 22–32)
Calcium: 10 mg/dL (ref 8.9–10.3)
Chloride: 99 mmol/L (ref 98–111)
Creatinine, Ser: 1.12 mg/dL (ref 0.61–1.24)
GFR, Estimated: >60 mL/min (ref >60)
Glucose, Bld: 114 mg/dL — ABNORMAL HIGH (ref 70–99)
Potassium: 4.8 mmol/L (ref 3.5–5.1)
Sodium: 138 mmol/L (ref 135–145)
Total Bilirubin: 0.5 mg/dL (ref 0.3–1.2)
Total Protein: 7.4 g/dL (ref 6.5–8.1)

## 2021-03-28 LAB — CBC
HCT: 43.2 % (ref 39.0–52.0)
Hemoglobin: 13.8 g/dL (ref 13.0–17.0)
MCH: 28.8 pg (ref 26.0–34.0)
MCHC: 31.9 g/dL (ref 30.0–36.0)
MCV: 90 fL (ref 80.0–100.0)
Platelets: 157 10*3/uL (ref 150–400)
RBC: 4.8 MIL/uL (ref 4.22–5.81)
RDW: 14.5 % (ref 11.5–15.5)
WBC: 6.7 10*3/uL (ref 4.0–10.5)
nRBC: 0 % (ref 0.0–0.2)

## 2021-03-28 LAB — LIPASE, BLOOD: Lipase: 33 U/L (ref 11–51)

## 2021-03-28 MED ORDER — IOHEXOL 300 MG/ML  SOLN
100.0000 mL | Freq: Once | INTRAMUSCULAR | Status: AC | PRN
  Administered 2021-03-28: 100 mL via INTRAVENOUS

## 2021-03-28 MED ORDER — DOCUSATE SODIUM 100 MG PO CAPS: 100.0000 mg | ORAL_CAPSULE | Freq: Two times a day (BID) | ORAL | 0 refills | Status: DC

## 2021-03-28 MED ORDER — OXYCODONE HCL 5 MG PO TABS: 5.0000 mg | ORAL_TABLET | Freq: Four times a day (QID) | ORAL | 0 refills | Status: DC | PRN

## 2021-03-28 NOTE — Discharge Instructions (Signed)
Please read and follow all provided instructions.  Your diagnoses today include:  1. Left lower quadrant abdominal pain     Tests performed today include: Blood cell counts and platelets: were normal Kidney and liver function tests: normal Pancreas function test (called lipase): normal Urine test to look for infection: no infection A blood or urine test for pregnancy (women only) Vital signs. See below for your results today.   Medications prescribed:  Oxycodone - narcotic pain medication  DO NOT drive or perform any activities that require you to be awake and alert because this medicine can make you drowsy.   Colace - stool softener  This medication can be found over-the-counter.   Continue colace for 2 weeks after your stools return to normal to prevent constipation.   Take any prescribed medications only as directed.  Home care instructions:  Follow any educational materials contained in this packet.  Follow-up instructions: Please follow-up with your primary care provider in the next 5 days for further evaluation of your symptoms.    Return instructions:  SEEK IMMEDIATE MEDICAL ATTENTION IF: The pain does not go away or becomes severe  A temperature above 101F develops  Repeated vomiting occurs (multiple episodes)  The pain becomes localized to portions of the abdomen. The right side could possibly be appendicitis. In an adult, the left lower portion of the abdomen could be colitis or diverticulitis.  Blood is being passed in stools or vomit (bright red or black tarry stools)  You develop chest pain, difficulty breathing, dizziness or fainting, or become confused, poorly responsive, or inconsolable (young children) If you have any other emergent concerns regarding your health  Additional Information: Abdominal (belly) pain can be caused by many things. Your caregiver performed an examination and possibly ordered blood/urine tests and imaging (CT scan, x-rays,  ultrasound). Many cases can be observed and treated at home after initial evaluation in the emergency department. Even though you are being discharged home, abdominal pain can be unpredictable. Therefore, you need a repeated exam if your pain does not resolve, returns, or worsens. Most patients with abdominal pain don't have to be admitted to the hospital or have surgery, but serious problems like appendicitis and gallbladder attacks can start out as nonspecific pain. Many abdominal conditions cannot be diagnosed in one visit, so follow-up evaluations are very important.  Your vital signs today were: BP 130/86 (BP Location: Left Arm)    Pulse 90    Temp 97.8 F (36.6 C)    Resp 18    Ht 5\' 7"  (1.702 m)    Wt 88.9 kg    SpO2 96%    BMI 30.70 kg/m  If your blood pressure (bp) was elevated above 135/85 this visit, please have this repeated by your doctor within one month. --------------

## 2021-03-28 NOTE — ED Triage Notes (Signed)
Patient here POV from Home with ABD Pain.  Patient was recently admitted and discharged 1 week PTA for Diverticulitis. Since Tuesday the Patient began to have similar symptoms to when he was first admitted.  Pain, Nausea, Lack of Appetite.  NAD Noted during Triage. A&Ox4. GCS 15. Ambulatory.

## 2021-03-28 NOTE — ED Provider Notes (Signed)
[facility] EMERGENCY DEPT Provider Note   CSN: [id number] Arrival date & time: 03/28/21  1158     History  Chief Complaint  Patient presents with   Abdominal Pain    Jack Haynes is a 81 y.o. male.  Patient with history of mesothelioma, on current active chemotherapy --presents to the emergency department for worsening left lower quadrant abdominal pain over the past 2 to 3 days.  Patient seen in the emergency department on 03/17/2021 by myself and diagnosed with diverticulitis with microperforation.  He was admitted to the hospital for 2 days and had significant improvement.  He was started on Augmentin which he is continuing to take.  Over the past 2 to 3 days he has had progressively worsening left lower quadrant pain, poorly controlled at home with pain medication.  He has had decreased appetite and constipation.  He has had no associated nausea, vomiting, or diarrhea.  Denies hematuria, dysuria, or increased frequency or urgency.  He has a history of appendectomy and prostate surgery.          Home Medications Prior to Admission medications   Medication Sig Start Date End Date Taking? Authorizing Provider  acetaminophen (TYLENOL) 500 MG tablet Take 2 tablets (1,000 mg total) by mouth every 6 (six) hours as needed for mild pain or fever. Patient taking differently: Take 500 mg by mouth every 6 (six) hours as needed for mild pain or fever. 11/15/19   Barrett, Erin R, PA-C  amoxicillin-clavulanate (AUGMENTIN) 875-125 MG tablet Take 1 tablet by mouth 2 (two) times daily for 12 days. 03/19/21 03/31/21  Barb Merino, MD  aspirin EC 81 MG tablet Take 81 mg by mouth daily. Swallow whole.    [provider]  calcium carbonate (TUMS - DOSED IN MG ELEMENTAL CALCIUM) 500 MG chewable tablet Chew 1,000 mg by mouth daily as needed for indigestion or heartburn.    [provider]  Cetirizine HCl (ZYRTEC ALLERGY) 10 MG CAPS Zyrtec 10 mg capsule  Take by oral  route.    [provider]  cyclobenzaprine (FLEXERIL) 5 MG tablet Take 1 tablet (5 mg total) by mouth 3 (three) times daily as needed for muscle spasms. 03/12/21   Ailene Ards, NP  dexamethasone (DECADRON) 4 MG tablet TAKE 1 TABLET BY MOUTH TWICE DAILY THE DAY BEFORE, DAY OF AND DAY AFTER CHEMOTHERAPY EVERY 3 WEEKS 09/17/20   Curt Bears, MD  empagliflozin (JARDIANCE) 10 MG TABS tablet Take by mouth daily.    [provider]  ferrous sulfate 325 (65 FE) MG tablet Take 325 mg by mouth daily with breakfast.    [provider]  folic acid (FOLVITE) 1 MG tablet TAKE 1 TABLET BY MOUTH EVERY DAY 02/11/21   Heilingoetter, Cassandra L, PA-C  lidocaine-prilocaine (EMLA) cream Apply to Port-A-Cath site 30-60-minute before treatment. 09/04/20   Curt Bears, MD  losartan-hydrochlorothiazide [facility]) 100-12.5 MG tablet TAKE 1 TABLET BY MOUTH EVERY DAY 01/03/21   [name], MD  metFORMIN (GLUCOPHAGE) 500 MG tablet TAKE 1 TABLET BY MOUTH 2 TIMES DAILY WITH A MEAL. 03/11/21   [name], MD  mometasone (ELOCON) 0.1 % cream Apply topically 2 (two) times daily. 01/13/21   [provider]  omeprazole (PRILOSEC) 20 MG capsule Take 20 mg by mouth daily.    [provider]  ondansetron (ZOFRAN ODT) 4 MG disintegrating tablet Take 1 tablet (4 mg total) by mouth every 8 (eight) hours as needed for nausea or vomiting. Starting 3  days after chemotherapy 02/28/20   Heilingoetter, Cassandra L, PA-C  prochlorperazine (COMPAZINE) 10 MG tablet TAKE 1 TABLET BY MOUTH EVERY 6 HOURS AS NEEDED FOR NAUSEA OR VOMITING. 09/17/20   Curt Bears, MD  rosuvastatin (CRESTOR) 20 MG tablet TAKE 1 TABLET BY MOUTH EVERY DAY 08/01/20   O'Neal, Cassie Freer, MD  simethicone (MYLICON) 253 MG chewable tablet Chew 125 mg by mouth daily as needed (gas).    [provider]      Allergies    Patient has no known allergies.    Review of Systems   Review of  Systems  Physical Exam Updated Vital Signs BP 130/86 (BP Location: Left Arm)    Pulse 90    Temp 97.8 F (36.6 C)    Resp 18    Ht 5\' 7"  (6.644 m)    Wt 88.9 kg    SpO2 96%    BMI 30.70 kg/m  Physical Exam Vitals and nursing note reviewed.  Constitutional:      General: He is not in acute distress.    Appearance: He is well-developed.  HENT:     Head: Normocephalic and atraumatic.  Eyes:     General:        Right eye: No discharge.        Left eye: No discharge.     Conjunctiva/sclera: Conjunctivae normal.  Cardiovascular:     Rate and Rhythm: Normal rate and regular rhythm.     Heart sounds: Normal heart sounds.  Pulmonary:     Effort: Pulmonary effort is normal.     Breath sounds: Normal breath sounds.  Abdominal:     Palpations: Abdomen is soft.     Tenderness: There is abdominal tenderness in the left lower quadrant.     Comments: Patient with left lower quadrant tenderness to palpation, winces in pain over the low left lateral abdomen  Musculoskeletal:     Cervical back: Normal range of motion and neck supple.  Skin:    General: Skin is warm and dry.  Neurological:     Mental Status: He is alert.    ED Results / Procedures / Treatments   Labs (all labs ordered are listed, but only abnormal results are displayed) Labs Reviewed  COMPREHENSIVE METABOLIC PANEL - Abnormal; Notable for the following components:      Result Value   Glucose, Bld 114 (*)    All other components within normal limits  URINALYSIS, ROUTINE W REFLEX MICROSCOPIC - Abnormal; Notable for the following components:   Specific Gravity, Urine 1.034 (*)    Glucose, UA >1,000 (*)    Protein, ur 30 (*)    All other components within normal limits  LIPASE, BLOOD  CBC    EKG None  Radiology No results found.  Procedures Procedures    Medications Ordered in ED Medications - No data to display  ED Course/ Medical Decision Making/ A&P    Patient seen and examined. History obtained  directly from patient and his wife at bedside who gives history about the patient's current symptoms at home. Work-up including labs, imaging, EKG ordered in triage, if performed, were reviewed.    Labs/EKG: Independently reviewed and interpreted.  This included: CBC with white count 6.7; CMP glucose 114; lipase 33.   Imaging: CT abdomen pelvis with contrast to reevaluate the area of diverticulitis given worsening symptoms on antibiotics.  Medications/Fluids: none ordered.   Most recent vital signs reviewed and are as follows: BP 130/86 (BP Location: Left Arm)  Pulse 90    Temp 97.8 F (36.6 C)    Resp 18    Ht 5\' 7"  (1.702 m)    Wt 88.9 kg    SpO2 96%    BMI 30.70 kg/m   Initial impression: Persistent diverticulitis  3:52 PM Reassessment performed. Patient appears uncomfortable but in a position of comfort sitting in a chair.  Offered IV pain medication, he declines.  Labs and imaging personally reviewed and interpreted including: CT abdomen pelvis which appears improved, agree resolution of extraluminal air.  Reviewed additional pertinent lab work and imaging with patient at bedside including: Reassuring CT imaging.  Most current vital signs reviewed and are as follows: BP 130/86 (BP Location: Left Arm)    Pulse 90    Temp 97.8 F (36.6 C)    Resp 18    Ht 5\' 7"  (1.702 m)    Wt 88.9 kg    SpO2 96%    BMI 30.70 kg/m   Plan: Discharge to home  Home treatment: Prescription written for oxycodone, colace.   Return and follow-up instructions: The patient was urged to return to the Emergency Department immediately with worsening of current symptoms, worsening abdominal pain, persistent vomiting, blood noted in stools, fever, or any other concerns. The patient verbalized understanding. Encouraged patient to follow-up with their provider in 5 days. Patient verbalized understanding and agreed with plan.                               Medical Decision Making Amount and/or Complexity of  Data Reviewed Labs: ordered. Radiology: ordered.   For this patient's complaint of abdominal pain, the following conditions were considered on the differential diagnosis: gastritis/PUD, enteritis/duodenitis, appendicitis, cholelithiasis/cholecystitis, cholangitis, pancreatitis, ruptured viscus, colitis, diverticulitis, proctitis, cystitis, pyelonephritis, ureteral colic, aortic dissection, aortic aneurysm. In women, ectopic pregnancy, pelvic inflammatory disease, ovarian cysts, and tubo-ovarian abscess were also considered. Atypical chest etiologies were also considered including ACS, PE, and pneumonia.   Fortunately CT imaging today does not show worsening of previous microperforation.  It actually shows resolution of symptoms without other areas of inflammation.  The patient's vital signs, pertinent lab work and imaging were reviewed and interpreted as discussed in the ED course. Hospitalization was considered for further testing, treatments, or serial exams/observation. However as patient is well-appearing, has a stable exam, and reassuring studies today, I do not feel that they warrant admission at this time. This plan was discussed with the patient who verbalizes agreement and comfort with this plan and seems reliable and able to return to the Emergency Department with worsening or changing symptoms.          Final Clinical Impression(s) / ED Diagnoses Final diagnoses:  Left lower quadrant abdominal pain    Rx / DC Orders ED Discharge Orders          Ordered    oxyCODONE (OXY IR/ROXICODONE) 5 MG immediate release tablet  Every 6 hours PRN        03/28/21 1549    docusate sodium (COLACE) 100 MG capsule  Every 12 hours        03/28/21 1549              Carlisle Cater, PA-C 03/28/21 1554    Gareth Morgan, MD 03/29/21 [phone]

## 2021-03-28 NOTE — ED Notes (Signed)
EMT-P provided AVS using Teachback Method. Patient verbalizes understanding of Discharge Instructions. Opportunity for Questioning and Answers were provided by EMT-P. Patient Discharged from ED.  ? ?

## 2021-03-31 NOTE — Progress Notes (Signed)
[facility] OFFICE PROGRESS NOTE  Jack Pollen, MD [place] [address]  DIAGNOSIS: Stage I/II malignant pleural mesothelioma involving the left hemithorax diagnosed in September 2021.  PRIOR THERAPY: None  CURRENT THERAPY: Systemic chemotherapy with carboplatin for AUC of 5, Alimta 500 mg/M2 and Avastin 15 mg/KG every 3 weeks.  Status post 23 cycles.  Starting from cycle #7 the patient will be on treatment with maintenance Avastin 15 mg/KG every 3 weeks  INTERVAL HISTORY: Jack Haynes 81 y.o. male returns to the clinic today for a follow-up visit.  The patient is feeling fair today.  In the interval since his last appointment, the patient was seen in the emergency room on 03/17/2021 and was diagnosed with diverticulitis with microperforation due to a small foci of extraluminal air in the left lower quadrant.  He was treated with antibiotics, and finished his course of antibiotics on 03/31/21. The patient presented to the emergency room again on 03/28/2021 for the same concern.  He had a repeat CT scan which actually showed resolution of these findings.   Since this time his symptoms have improved. The soreness in his LLQ has improved. He lost some weight while in the hospital. He has a decreased appetite and started back drinking ensure 1x per day.  He denies any recent fever, chills, or night sweats.  He has had some fatigue, although he feels better today since he took decadron yesterday. Denies cough, chest pain, or hemoptysis.  He reports his baseline dyspnea on exertion.  Denies any abnormal bleeding or bruising. He had some constipation so he has been taking stool softeners.  His significant other notes that he has been drinking water Gatorade, and Body Armor.  The patient is here today for evaluation before considering starting cycle #24.    MEDICAL HISTORY: Past Medical History:  Diagnosis Date   Arthritis    Cancer ([place])    hx of prostatae cancer    Dyspnea    until chest tube placed   Hypertension    Mesothelioma [facility])     ALLERGIES:  has No Known Allergies.  MEDICATIONS:  Current Outpatient Medications  Medication Sig Dispense Refill   acetaminophen (TYLENOL) 500 MG tablet Take 2 tablets (1,000 mg total) by mouth every 6 (six) hours as needed for mild pain or fever. (Patient taking differently: Take 500 mg by mouth every 6 (six) hours as needed for mild pain or fever.) 30 tablet 0   aspirin EC 81 MG tablet Take 81 mg by mouth daily. Swallow whole.     calcium carbonate (TUMS - DOSED IN MG ELEMENTAL CALCIUM) 500 MG chewable tablet Chew 1,000 mg by mouth daily as needed for indigestion or heartburn.     Cetirizine HCl (ZYRTEC ALLERGY) 10 MG CAPS Zyrtec 10 mg capsule  Take by oral route.     cyclobenzaprine (FLEXERIL) 5 MG tablet Take 1 tablet (5 mg total) by mouth 3 (three) times daily as needed for muscle spasms. 30 tablet 2   dexamethasone (DECADRON) 4 MG tablet TAKE 1 TABLET BY MOUTH TWICE DAILY THE DAY BEFORE, DAY OF AND DAY AFTER CHEMOTHERAPY EVERY 3 WEEKS 40 tablet 1   docusate sodium (COLACE) 100 MG capsule Take 1 capsule (100 mg total) by mouth every 12 (twelve) hours. 30 capsule 0   empagliflozin (JARDIANCE) 10 MG TABS tablet Take by mouth daily.     ferrous sulfate 325 (65 FE) MG tablet Take 325 mg by mouth daily with breakfast.  folic acid (FOLVITE) 1 MG tablet TAKE 1 TABLET BY MOUTH EVERY DAY 90 tablet 1   lidocaine-prilocaine (EMLA) cream Apply to Port-A-Cath site 30-60-minute before treatment. 30 g 0   losartan-hydrochlorothiazide (HYZAAR) 100-12.5 MG tablet TAKE 1 TABLET BY MOUTH EVERY DAY 90 tablet 3   metFORMIN (GLUCOPHAGE) 500 MG tablet TAKE 1 TABLET BY MOUTH 2 TIMES DAILY WITH A MEAL. 180 tablet 3   mometasone (ELOCON) 0.1 % cream Apply topically 2 (two) times daily.     omeprazole (PRILOSEC) 20 MG capsule Take 20 mg by mouth daily.     ondansetron (ZOFRAN ODT) 4 MG disintegrating tablet Take 1 tablet (4 mg  total) by mouth every 8 (eight) hours as needed for nausea or vomiting. Starting 3 days after chemotherapy 30 tablet 1   oxyCODONE (OXY IR/ROXICODONE) 5 MG immediate release tablet Take 1 tablet (5 mg total) by mouth every 6 (six) hours as needed for severe pain. 10 tablet 0   prochlorperazine (COMPAZINE) 10 MG tablet TAKE 1 TABLET BY MOUTH EVERY 6 HOURS AS NEEDED FOR NAUSEA OR VOMITING. 30 tablet 0   rosuvastatin (CRESTOR) 20 MG tablet TAKE 1 TABLET BY MOUTH EVERY DAY 90 tablet 3   simethicone (MYLICON) 497 MG chewable tablet Chew 125 mg by mouth daily as needed (gas).     No current facility-administered medications for this visit.    SURGICAL HISTORY:  Past Surgical History:  Procedure Laterality Date   APPENDECTOMY     BACK SURGERY  1998   disectomy   CHEST TUBE INSERTION Left 11/13/2019   Procedure: INSERTION PLEURAL DRAINAGE CATHETER;  Surgeon: Grace Isaac, MD;  Location: [place];  Service: Thoracic;  Laterality: Left;   EYE SURGERY Left 1965   injury   IR [place]  05/27/2020   JOINT REPLACEMENT Left 2010   left knee replacement   KNEE SURGERY     PLEURAL BIOPSY Left 11/13/2019   Procedure: PLEURAL BIOPSY;  Surgeon: Grace Isaac, MD;  Location: [place];  Service: Thoracic;  Laterality: Left;   PORTACATH PLACEMENT Left 11/26/2019   Procedure: INSERTION PORT-A-CATH BARD POWERPORT 9.6 FR CATHETER;  Surgeon: Grace Isaac, MD;  Location: [place];  Service: Thoracic;  Laterality: Left;   PROSTATE SURGERY     REMOVAL OF PLEURAL DRAINAGE CATHETER Left 11/26/2019   Procedure: REMOVAL OF PLEURAL DRAINAGE CATHETER;  Surgeon: Grace Isaac, MD;  Location: [place];  Service: Thoracic;  Laterality: Left;   SPINE SURGERY     TALC PLEURODESIS Left 11/13/2019   Procedure: possible TALC PLEURADESIS;  Surgeon: Grace Isaac, MD;  Location: [place];  Service: Thoracic;  Laterality: Left;   TOTAL KNEE ARTHROPLASTY  02/01/2012   Procedure: TOTAL KNEE  ARTHROPLASTY;  Surgeon: Sydnee Cabal, MD;  Location: [facility];  Service: Orthopedics;  Laterality: Left;   VIDEO ASSISTED THORACOSCOPY Left 11/13/2019   Procedure: VIDEO ASSISTED THORACOSCOPY;  Surgeon: Grace Isaac, MD;  Location: [place];  Service: Thoracic;  Laterality: Left;   VIDEO BRONCHOSCOPY N/A 11/13/2019   Procedure: VIDEO BRONCHOSCOPY;  Surgeon: Grace Isaac, MD;  [facility];  Service: Thoracic;  Laterality: N/A;    REVIEW OF SYSTEMS:   Review of Systems  Constitutional: Positive for fatigue, decreased appetite, weight loss.  Negative for chills and fever.  HENT: Negative for mouth sores, nosebleeds, sore throat and trouble swallowing.   Eyes: Negative for eye problems and icterus.  Respiratory: Positive for baseline dyspnea exertion.  Negative for cough, hemoptysis, and wheezing.   Cardiovascular: Negative for chest pain and leg swelling.  Gastrointestinal: Positive for improved left lower quadrant abdominal pain and improved nausea.  Positive for occasional constipation.  Negative for diarrhea and vomiting.  Genitourinary: Negative for bladder incontinence, difficulty urinating, dysuria, frequency and hematuria.   Musculoskeletal: Negative for back pain, gait problem, neck pain and neck stiffness.  Skin: Negative for itching and rash.  Neurological: Negative for dizziness, extremity weakness, gait problem, headaches, light-headedness and seizures.  Hematological: Negative for adenopathy. Does not bruise/bleed easily.  Psychiatric/Behavioral: Negative for confusion, depression and sleep disturbance. The patient is not nervous/anxious.     PHYSICAL EXAMINATION:  Blood pressure 135/86, pulse (!) 110, temperature (!) 97.4 F (36.3 C), temperature source Oral, resp. rate 19, height 5\' 7"  (1.702 m), weight 190 lb 1.6 oz (86.2 kg), SpO2 96 %.  ECOG PERFORMANCE STATUS: 1  Physical Exam  Constitutional: Oriented to person, place, and time and well-developed,  well-nourished, and in no distress. HENT:  Head: Normocephalic and atraumatic.  Mouth/Throat: Oropharynx is clear and moist. No oropharyngeal exudate.  Eyes: Conjunctivae are normal. Right eye exhibits no discharge. Left eye exhibits no discharge. No scleral icterus.  Neck: Normal range of motion. Neck supple.  Cardiovascular: Normal rate, regular rhythm, normal heart sounds and intact distal pulses.   Pulmonary/Chest: Effort normal and breath sounds normal. No respiratory distress. No wheezes. No rales.  Abdominal: Soft. Bowel sounds are normal. Exhibits no distension and no mass. There is no tenderness.  Musculoskeletal: Normal range of motion. Exhibits no edema.  Lymphadenopathy:    No cervical adenopathy.  Neurological: Alert and oriented to person, place, and time. Exhibits normal muscle tone. Gait normal. Coordination normal.  Skin: Skin is warm and dry. No rash noted. Not diaphoretic. No erythema. No pallor.  Psychiatric: Mood, memory and judgment normal.  Vitals reviewed.  LABORATORY DATA: Lab Results  Component Value Date   WBC 7.4 04/02/2021   HGB 13.1 04/02/2021   HCT 38.7 (L) 04/02/2021   MCV 87.8 04/02/2021   PLT 184 04/02/2021      Chemistry      Component Value Date/Time   NA 139 04/02/2021 0758   NA 141 09/24/2019 0832   K 4.2 04/02/2021 0758   CL 100 04/02/2021 0758   CO2 26 04/02/2021 0758   BUN 26 (H) 04/02/2021 0758   BUN 17 09/24/2019 0832   CREATININE 1.39 (H) 04/02/2021 0758   CREATININE 1.01 12/24/2014 0916      Component Value Date/Time   CALCIUM 9.7 04/02/2021 0758   ALKPHOS 47 04/02/2021 0758   AST 14 (L) 04/02/2021 0758   ALT 9 04/02/2021 0758   BILITOT 0.5 04/02/2021 0758       RADIOGRAPHIC STUDIES:  CT ABDOMEN PELVIS W CONTRAST  Result Date: 03/28/2021 CLINICAL DATA:  Left lower quadrant abdominal pain. Recent episode of diverticulitis. History of mesothelioma. EXAM: CT ABDOMEN AND PELVIS WITH CONTRAST TECHNIQUE: Multidetector CT  imaging of the abdomen and pelvis was performed using the standard protocol following bolus administration of intravenous contrast. RADIATION DOSE REDUCTION: This exam was performed according to the departmental dose-optimization program which includes automated exposure control, adjustment of the mA and/or kV according to patient size and/or use of iterative reconstruction technique. CONTRAST:  137mL OMNIPAQUE IOHEXOL 300 MG/ML  SOLN COMPARISON:  03/17/2021 FINDINGS: Lower chest: Right lung base and pleural space are normal. No change in the gross appearance of the pleural disease in the left chest consistent  with the patient's history of mesothelioma. No sign of interval progression over the last 11 days. Hepatobiliary: Liver parenchyma is normal.  No calcified gallstones. Pancreas: Normal Spleen: Normal Adrenals/Urinary Tract: Adrenal glands are normal. Kidneys are normal. No cyst, mass, stone or hydronephrosis. Bladder is normal. Stomach/Bowel: Stomach and small intestine are normal. Diverticulosis of the left colon as seen previously. Previously seen extraluminal air in the left lower quadrant has resolved. No gross inflammatory change visible by CT to suggest ongoing or worsening diverticulitis. No sign of abscess. No free air presently. Vascular/Lymphatic: Aortic atherosclerosis. No aneurysm. IVC is normal. No adenopathy. Reproductive: Previous prostatectomy. Other: No hernia. Musculoskeletal: Ordinary spinal degenerative changes. IMPRESSION: Resolution of previously seen extraluminal air in the left lower quadrant. Diverticulosis of the left colon but without imaging evidence of diverticulitis. No abscess. No change in the appearance of malignant pleural disease on the left since the study of 11 days ago. Choose 1 Previous prostatectomy without complicating feature. Electronically Signed   By: Nelson Chimes M.D.   On: 03/28/2021 15:16   CT ABDOMEN PELVIS W CONTRAST  Result Date: 03/17/2021 CLINICAL DATA:   Left-sided abdominal pain for the past 3 weeks. History of mesothelioma, on chemotherapy. EXAM: CT ABDOMEN AND PELVIS WITH CONTRAST TECHNIQUE: Multidetector CT imaging of the abdomen and pelvis was performed using the standard protocol following bolus administration of intravenous contrast. RADIATION DOSE REDUCTION: This exam was performed according to the departmental dose-optimization program which includes automated exposure control, adjustment of the mA and/or kV according to patient size and/or use of iterative reconstruction technique. CONTRAST:  47mL OMNIPAQUE IOHEXOL 300 MG/ML  SOLN COMPARISON:  CT chest dated February 13, 2021. CT abdomen pelvis dated February 05, 2020. FINDINGS: Lower chest: Left pleural thickening and nodularity again noted, also involving the pericardium and left cardiophrenic angle. 3.9 cm nodule adjacent to the left ventricle, previously 2.8 cm. Hepatobiliary: No focal liver abnormality is seen. No gallstones, gallbladder wall thickening, or biliary dilatation. Pancreas: Unremarkable. No pancreatic ductal dilatation or surrounding inflammatory changes. Spleen: Normal in size without focal abnormality. Adrenals/Urinary Tract: Adrenal glands are unremarkable. Kidneys are normal, without renal calculi, focal lesion, or hydronephrosis. Bladder is unremarkable. Stomach/Bowel: Extensive left-sided colonic diverticulosis. Small foci of extraluminal air adjacent to the proximal sigmoid colon and nearby small bowel (series 2, images 59, 66, and 69). No colonic wall thickening or surrounding inflammatory changes. Unchanged small hiatal hernia. The stomach and small bowel are otherwise unremarkable. No obstruction. Vascular/Lymphatic: Aortic atherosclerosis. No enlarged abdominal or pelvic lymph nodes. Reproductive: Prior prostatectomy. Other: No free fluid or pneumoperitoneum. Musculoskeletal: No acute or significant osseous findings. IMPRESSION: 1. Sequelae of recent sigmoid diverticulitis  related microperforation with small foci of extraluminal air in the left lower quadrant. No active inflammatory changes or colonic wall thickening on the current study. No abscess. 2. Left pleural thickening and nodularity again noted, also involving the pericardium and left cardiophrenic angle, consistent with patient's known mesothelioma, slightly progressed. 3. Aortic Atherosclerosis (ICD10-I70.0). Electronically Signed   By: Titus Dubin M.D.   On: 03/17/2021 15:17     ASSESSMENT/PLAN:  This is a very pleasant 81 year old Caucasian male diagnosed with malignant mesothelioma involving the left hemithorax.  The patient was diagnosed in September 2021.   The patient is currently undergoing systemic chemotherapy with carboplatin for AUC of 5, Alimta 500 mg/M2 and Avastin 15 mg/KG every 3 weeks.  Status post 23 cycles.  Starting from cycle #7 the patient will be on maintenance treatment with Avastin 15  mg/KG every 3 weeks. The patient has been tolerating this treatment well with no concerning adverse effects.  The patient was recently seen in the emergency room for diverticulitis and microperforation.  The patient was seen with Dr. Julien Nordmann.  Although the patient had diverticulitis, Avastin can increase the risk of GI perforation.  Therefore, Dr. Julien Nordmann recommends holding/discontinuing treatment at this point time.  Dr. Julien Nordmann recommends giving the patient a break from treatment.  We will arrange for CT scan the chest, abdomen, and pelvis in 3 months for evaluation to restage his disease and to follow-up on the diverticulitis/microperforation.  If he shows disease progression with his next scan, Dr. Julien Nordmann discussed that he would not restart the patient on Avastin and would recommend starting the patient on immunotherapy.  I will arrange for port flush every 6 to 8 weeks.  We will see him back for follow-up visit in 3 months for evaluation to review his scan results and for more detailed  discussion about his current condition and recommended treatment options.  The patient was encouraged to hydrate.  Encouraged to continue drinking supplemental drinks such as boost or Ensure.  The patient was advised to call immediately if he has any concerning symptoms in the interval. The patient voices understanding of current disease status and treatment options and is in agreement with the current care plan. All questions were answered. The patient knows to call the clinic with any problems, questions or concerns. We can certainly see the patient much sooner if necessary       Orders Placed This Encounter  Procedures   CT Chest W Contrast    Standing Status:   Future    Standing Expiration Date:   04/02/2022    Order Specific Question:   If indicated for the ordered procedure, I authorize the administration of contrast media per Radiology protocol    Answer:   Yes    Order Specific Question:   Preferred imaging location?    Answer:   [facility]   CT Abdomen Pelvis W Contrast    Standing Status:   Future    Standing Expiration Date:   04/02/2022    Order Specific Question:   If indicated for the ordered procedure, I authorize the administration of contrast media per Radiology protocol    Answer:   Yes    Order Specific Question:   Preferred imaging location?    Answer:   [facility]    Order Specific Question:   Is Oral Contrast requested for this exam?    Answer:   Yes, Per Radiology protocol     Jack Allemand L Tayelor Osborne, PA-C 04/02/21  ADDENDUM: Hematology/Oncology Attending: I had a face-to-face encounter with the patient today.  I reviewed his record, lab, scan and recommended his care plan.  This is a very pleasant 81 years old white male with malignant pleural mesothelioma involving the left hemithorax diagnosed in September 2021.  The patient started systemic chemotherapy with induction carboplatin, Alimta and Avastin for 6 cycles followed by  maintenance treatment with single agent Avastin.  He has been tolerating his treatment well with no concerning adverse effects but he was recently admitted to the hospital with abdominal pain and diarrhea and he was found to have diverticulitis with microperforations that could have been result from his treatment with Avastin. I had a lengthy discussion with the patient and his girlfriend about his condition and treatment options. He did not have any evidence for disease progression on the  recent scan. I recommended for the patient to continue on observation for now and we will discontinue his treatment with Avastin at this point. I will see him back for follow-up visit in 3 months for evaluation with repeat CT scan of the chest, abdomen and pelvis for restaging of his disease. If he has any evidence for disease progression in the future, I will consider him for treatment with immunotherapy with ipilimumab and nivolumab. The patient was advised to call immediately if he has any other concerning symptoms in the interval. Disclaimer: This note was dictated with voice recognition software. Similar sounding words can inadvertently be transcribed and may be missed upon review. Eilleen Kempf, MD

## 2021-04-02 ENCOUNTER — Inpatient Hospital Stay: Payer: PPO

## 2021-04-02 ENCOUNTER — Inpatient Hospital Stay: Payer: PPO | Admitting: Physician Assistant

## 2021-04-02 ENCOUNTER — Inpatient Hospital Stay: Payer: PPO | Attending: Internal Medicine

## 2021-04-02 ENCOUNTER — Other Ambulatory Visit: Payer: Self-pay

## 2021-04-02 VITALS — BP 135/86 | HR 110 | Temp 97.4°F | Resp 19 | Ht 67.0 in | Wt 190.1 lb

## 2021-04-02 DIAGNOSIS — C450 Mesothelioma of pleura: Secondary | ICD-10-CM | POA: Insufficient documentation

## 2021-04-02 DIAGNOSIS — C457 Mesothelioma of other sites: Secondary | ICD-10-CM | POA: Diagnosis not present

## 2021-04-02 DIAGNOSIS — Z8546 Personal history of malignant neoplasm of prostate: Secondary | ICD-10-CM | POA: Insufficient documentation

## 2021-04-02 DIAGNOSIS — I10 Essential (primary) hypertension: Secondary | ICD-10-CM | POA: Diagnosis not present

## 2021-04-02 DIAGNOSIS — Z95828 Presence of other vascular implants and grafts: Secondary | ICD-10-CM

## 2021-04-02 LAB — CBC WITH DIFFERENTIAL (CANCER CENTER ONLY)
Abs Immature Granulocytes: 0.04 10*3/uL (ref 0.00–0.07)
Basophils Absolute: 0 10*3/uL (ref 0.0–0.1)
Basophils Relative: 0 %
Eosinophils Absolute: 0 10*3/uL (ref 0.0–0.5)
Eosinophils Relative: 0 %
HCT: 38.7 % — ABNORMAL LOW (ref 39.0–52.0)
Hemoglobin: 13.1 g/dL (ref 13.0–17.0)
Immature Granulocytes: 1 %
Lymphocytes Relative: 10 %
Lymphs Abs: 0.7 10*3/uL (ref 0.7–4.0)
MCH: 29.7 pg (ref 26.0–34.0)
MCHC: 33.9 g/dL (ref 30.0–36.0)
MCV: 87.8 fL (ref 80.0–100.0)
Monocytes Absolute: 0.4 10*3/uL (ref 0.1–1.0)
Monocytes Relative: 5 %
Neutro Abs: 6.2 10*3/uL (ref 1.7–7.7)
Neutrophils Relative %: 84 %
Platelet Count: 184 10*3/uL (ref 150–400)
RBC: 4.41 MIL/uL (ref 4.22–5.81)
RDW: 14.2 % (ref 11.5–15.5)
WBC Count: 7.4 10*3/uL (ref 4.0–10.5)
nRBC: 0 % (ref 0.0–0.2)

## 2021-04-02 LAB — CMP (CANCER CENTER ONLY)
ALT: 9 U/L (ref 0–44)
AST: 14 U/L — ABNORMAL LOW (ref 15–41)
Albumin: 4.2 g/dL (ref 3.5–5.0)
Alkaline Phosphatase: 47 U/L (ref 38–126)
Anion gap: 13 (ref 5–15)
BUN: 26 mg/dL — ABNORMAL HIGH (ref 8–23)
CO2: 26 mmol/L (ref 22–32)
Calcium: 9.7 mg/dL (ref 8.9–10.3)
Chloride: 100 mmol/L (ref 98–111)
Creatinine: 1.39 mg/dL — ABNORMAL HIGH (ref 0.61–1.24)
GFR, Estimated: 51 mL/min — ABNORMAL LOW (ref >60)
Glucose, Bld: 220 mg/dL — ABNORMAL HIGH (ref 70–99)
Potassium: 4.2 mmol/L (ref 3.5–5.1)
Sodium: 139 mmol/L (ref 135–145)
Total Bilirubin: 0.5 mg/dL (ref 0.3–1.2)
Total Protein: 7.1 g/dL (ref 6.5–8.1)

## 2021-04-02 MED ORDER — SODIUM CHLORIDE 0.9% FLUSH
10.0000 mL | Freq: Once | INTRAVENOUS | Status: AC
  Administered 2021-04-02: 10 mL

## 2021-04-05 ENCOUNTER — Encounter: Payer: Self-pay | Admitting: Internal Medicine

## 2021-04-07 ENCOUNTER — Other Ambulatory Visit: Payer: Self-pay

## 2021-04-07 ENCOUNTER — Other Ambulatory Visit: Payer: Self-pay | Admitting: Emergency Medicine

## 2021-04-07 ENCOUNTER — Encounter: Payer: Self-pay | Admitting: Emergency Medicine

## 2021-04-07 ENCOUNTER — Ambulatory Visit (INDEPENDENT_AMBULATORY_CARE_PROVIDER_SITE_OTHER): Payer: PPO | Admitting: Emergency Medicine

## 2021-04-07 VITALS — BP 132/62 | HR 88 | Ht 67.0 in | Wt 191.0 lb

## 2021-04-07 DIAGNOSIS — C457 Mesothelioma of other sites: Secondary | ICD-10-CM

## 2021-04-07 DIAGNOSIS — I152 Hypertension secondary to endocrine disorders: Secondary | ICD-10-CM

## 2021-04-07 DIAGNOSIS — M792 Neuralgia and neuritis, unspecified: Secondary | ICD-10-CM | POA: Insufficient documentation

## 2021-04-07 DIAGNOSIS — E1159 Type 2 diabetes mellitus with other circulatory complications: Secondary | ICD-10-CM | POA: Diagnosis not present

## 2021-04-07 MED ORDER — PREGABALIN 50 MG PO CAPS: 50.0000 mg | ORAL_CAPSULE | Freq: Two times a day (BID) | ORAL | 3 refills | Status: DC

## 2021-04-07 NOTE — Assessment & Plan Note (Addendum)
Well-controlled hypertension and stable.  Continue Hyzaar 100-12.5 mg daily. BP Readings from Last 3 Encounters:  04/07/21 132/62  04/02/21 135/86  03/28/21 (!) 130/94  Well-controlled diabetes.  Continue metformin and Jardiance 10 mg daily. Diet and nutrition discussed. Continue rosuvastatin 20 mg daily.

## 2021-04-07 NOTE — Patient Instructions (Signed)
Neuropathic Pain Neuropathic pain is pain caused by damage to the nerves that are responsible for certain sensations in your body (sensory nerves). Neuropathic pain can make you more sensitive to pain. Even a minor sensation can feel very painful. This is usually a long-term (chronic) condition that can be difficult to treat. The type of pain differs from person to person. It may: Start suddenly (acute), or it may develop slowly and become chronic. Come and go as damaged nerves heal, or it may stay at the same level for years. Cause emotional distress, loss of sleep, and a lower quality of life. What are the causes? The most common cause of this condition is diabetes. Many other diseases and conditions can also cause neuropathic pain. Causes of neuropathic pain can be classified as: Toxic. This is caused by medicines and chemicals. The most common causes of toxic neuropathic pain is damage from medicines that kill cancer cells (chemotherapy) or alcohol abuse. Metabolic. This can be caused by: Diabetes. Lack of vitamins like B12. Traumatic. Any injury that cuts, crushes, or stretches a nerve can cause damage and pain. Compression-related. If a sensory nerve gets trapped or compressed for a long period of time, the blood supply to the nerve can be cut off. Vascular. Many blood vessel diseases can cause neuropathic pain by decreasing blood supply and oxygen to nerves. Autoimmune. This type of pain results from diseases in which the body's defense system (immune system) mistakenly attacks sensory nerves. Examples of autoimmune diseases that can cause neuropathic pain include lupus and multiple sclerosis. Infectious. Many types of viral infections can damage sensory nerves and cause pain. Shingles infection is a common cause of this type of pain. Inherited. Neuropathic pain can be a symptom of many diseases that are passed down through families (genetic). What increases the risk? You are more likely to  develop this condition if: You have diabetes. You smoke. You drink too much alcohol. You are taking certain medicines, including chemotherapy or medicines that treat immune system disorders. What are the signs or symptoms? The main symptom is pain. Neuropathic pain is often described as: Burning. Shock-like. Stinging. Hot or cold. Itching. How is this diagnosed? No single test can diagnose neuropathic pain. It is diagnosed based on: A physical exam and your symptoms. Your health care provider will ask you about your pain. You may be asked to use a pain scale to describe how bad your pain is. Tests. These may be done to see if you have a cause and location of any nerve damage. They include: Nerve conduction studies and electromyography to test how well nerve signals travel through your nerves and muscles (electrodiagnostic testing). Skin biopsy to evaluate for small fiber neuropathy. Imaging studies, such as: X-rays. CT scan. MRI. How is this treated? Treatment for neuropathic pain may change over time. You may need to try different treatment options or a combination of treatments. Some options include: Treating the underlying cause of the neuropathy, such as diabetes, kidney disease, or vitamin deficiencies. Stopping medicines that can cause neuropathy, such as chemotherapy. Medicine to relieve pain. Medicines may include: Prescription or over-the-counter pain medicine. Anti-seizure medicine. Antidepressant medicines. Pain-relieving patches or creams that are applied to painful areas of skin. A medicine to numb the area (local anesthetic), which can be injected as a nerve block. Transcutaneous nerve stimulation. This uses electrical currents to block painful nerve signals. The treatment is painless. Alternative treatments, such as: Acupuncture. Meditation. Massage. Occupational or physical therapy. Pain management programs. Counseling. Follow  these instructions at  home: Medicines  Take over-the-counter and prescription medicines only as told by your health care provider. Ask your health care provider if the medicine prescribed to you: Requires you to avoid driving or using machinery. Can cause constipation. You may need to take these actions to prevent or treat constipation: Drink enough fluid to keep your urine pale yellow. Take over-the-counter or prescription medicines. Eat foods that are high in fiber, such as beans, whole grains, and fresh fruits and vegetables. Limit foods that are high in fat and processed sugars, such as fried or sweet foods. Lifestyle  Have a good support system at home. Consider joining a chronic pain support group. Do not use any products that contain nicotine or tobacco. These products include cigarettes, chewing tobacco, and vaping devices, such as e-cigarettes. If you need help quitting, ask your health care provider. Do not drink alcohol. General instructions Learn as much as you can about your condition. Work closely with all your health care providers to find the treatment plan that works best for you. Ask your health care provider what activities are safe for you. Keep all follow-up visits. This is important. Contact a health care provider if: Your pain treatments are not working. You are having side effects from your medicines. You are struggling with tiredness (fatigue), mood changes, depression, or anxiety. Get help right away if: You have thoughts of hurting yourself. Get help right away if you feel like you may hurt yourself or others, or have thoughts about taking your own life. Go to your nearest emergency room or: Call 911. Call the [facility] at [phone] or 988. This is open 24 hours a day. Text the Crisis Text Line at 605-711-1976. Summary Neuropathic pain is pain caused by damage to the nerves that are responsible for certain sensations in your body (sensory  nerves). Neuropathic pain may come and go as damaged nerves heal, or it may stay at the same level for years. Neuropathic pain is usually a long-term condition that can be difficult to treat. Consider joining a chronic pain support group. This information is not intended to replace advice given to you by your health care provider. Make sure you discuss any questions you have with your health care provider. Document Revised: 09/29/2020 Document Reviewed: 09/29/2020 Elsevier Patient Education  2022 [employer].

## 2021-04-07 NOTE — Progress Notes (Signed)
Jack Haynes 81 y.o.   Chief Complaint  Patient presents with   Hospitalization Follow-up    Pt states he is still in pain    HISTORY OF PRESENT ILLNESS: This is a 81 y.o. male complaining of intermittent "burning pain" to left side of chest and abdomen for the past several weeks or months.  Comes and goes, may have 2 or 3 episodes per day. Pain is around the area where he had chest tube insertion for large pleural effusion secondary to mesothelioma last year. Opiates do not work very well.  Too many side effects and constipation. Had recent bout of diverticulitis.  Much better.  Was seen in the emergency department.  Emergency department visit notes reviewed.   HPI   Prior to Admission medications   Medication Sig Start Date End Date Taking? Authorizing Provider  acetaminophen (TYLENOL) 500 MG tablet Take 2 tablets (1,000 mg total) by mouth every 6 (six) hours as needed for mild pain or fever. Patient taking differently: Take 500 mg by mouth every 6 (six) hours as needed for mild pain or fever. 11/15/19  Yes Barrett, Lodema Hong, PA-C  aspirin EC 81 MG tablet Take 81 mg by mouth daily. Swallow whole.   Yes [provider]  calcium carbonate (TUMS - DOSED IN MG ELEMENTAL CALCIUM) 500 MG chewable tablet Chew 1,000 mg by mouth daily as needed for indigestion or heartburn.   Yes [provider]  Cetirizine HCl (ZYRTEC ALLERGY) 10 MG CAPS Zyrtec 10 mg capsule  Take by oral route.   Yes [provider]  cyclobenzaprine (FLEXERIL) 5 MG tablet Take 1 tablet (5 mg total) by mouth 3 (three) times daily as needed for muscle spasms. 03/12/21  Yes Ailene Ards, NP  dexamethasone (DECADRON) 4 MG tablet TAKE 1 TABLET BY MOUTH TWICE DAILY THE DAY BEFORE, DAY OF AND DAY AFTER CHEMOTHERAPY EVERY 3 WEEKS 09/17/20  Yes Curt Bears, MD  docusate sodium (COLACE) 100 MG capsule Take 1 capsule (100 mg total) by mouth every 12 (twelve) hours. 03/28/21  Yes Carlisle Cater, PA-C   empagliflozin (JARDIANCE) 10 MG TABS tablet Take by mouth daily.   Yes [provider]  ferrous sulfate 325 (65 FE) MG tablet Take 325 mg by mouth daily with breakfast.   Yes [provider]  folic acid (FOLVITE) 1 MG tablet TAKE 1 TABLET BY MOUTH EVERY DAY 02/11/21  Yes Heilingoetter, Cassandra L, PA-C  lidocaine-prilocaine (EMLA) cream Apply to Port-A-Cath site 30-60-minute before treatment. 09/04/20  Yes Curt Bears, MD  losartan-hydrochlorothiazide [facility]) 100-12.5 MG tablet TAKE 1 TABLET BY MOUTH EVERY DAY 01/03/21  Yes Leda Bellefeuille, Ines Bloomer, MD  metFORMIN (GLUCOPHAGE) 500 MG tablet TAKE 1 TABLET BY MOUTH 2 TIMES DAILY WITH A MEAL. 03/11/21  Yes Tykeisha Peer, Ines Bloomer, MD  mometasone (ELOCON) 0.1 % cream Apply topically 2 (two) times daily. 01/13/21  Yes [provider]  omeprazole (PRILOSEC) 20 MG capsule Take 20 mg by mouth daily.   Yes [provider]  ondansetron (ZOFRAN ODT) 4 MG disintegrating tablet Take 1 tablet (4 mg total) by mouth every 8 (eight) hours as needed for nausea or vomiting. Starting 3 days after chemotherapy 02/28/20  Yes Heilingoetter, Cassandra L, PA-C  oxyCODONE (OXY IR/ROXICODONE) 5 MG immediate release tablet Take 1 tablet (5 mg total) by mouth every 6 (six) hours as needed for severe pain. 03/28/21  Yes Carlisle Cater, PA-C  prochlorperazine (COMPAZINE) 10 MG tablet TAKE 1 TABLET BY MOUTH EVERY 6 HOURS AS  NEEDED FOR NAUSEA OR VOMITING. 09/17/20  Yes Curt Bears, MD  rosuvastatin (CRESTOR) 20 MG tablet TAKE 1 TABLET BY MOUTH EVERY DAY 08/01/20  Yes O'Neal, Cassie Freer, MD  simethicone (MYLICON) 469 MG chewable tablet Chew 125 mg by mouth daily as needed (gas).   Yes [provider]    No Known Allergies  Patient Active Problem List   Diagnosis Date Noted   Diverticulitis of colon with perforation 03/17/2021   DISH (diffuse idiopathic skeletal hyperostosis) 03/12/2021   Sinusitis 02/19/2021   Type 2 diabetes  mellitus with hyperglycemia, without long-term current use of insulin ([place]) 06/11/2020   Hypertension 02/28/2020   Port-A-Cath in place 01/09/2020   Mesothelioma of left lung ([place]) 11/23/2019   Encounter for antineoplastic chemotherapy 11/23/2019   S/P Left VATS drainage of pleural effusion, pleural biopsy, placement of Pleur-x catheter 11/15/2019   Atherosclerotic cardiovascular disease 10/29/2019   Pleural effusion 10/29/2019   History of prostate cancer 10/29/2019   Essential hypertension 10/09/2019   Abnormal EKG 01/02/2015   Prostate cancer ([place]) 11/01/2011    Past Medical History:  Diagnosis Date   Arthritis    Cancer ([place])    hx of prostatae cancer   Dyspnea    until chest tube placed   Hypertension    Mesothelioma [facility])     Past Surgical History:  Procedure Laterality Date   APPENDECTOMY     BACK SURGERY  1998   disectomy   CHEST TUBE INSERTION Left 11/13/2019   Procedure: INSERTION PLEURAL DRAINAGE CATHETER;  Surgeon: Grace Isaac, MD;  Location: [place];  Service: Thoracic;  Laterality: Left;   EYE SURGERY Left 1965   injury   IR [place]  05/27/2020   JOINT REPLACEMENT Left 2010   left knee replacement   KNEE SURGERY     PLEURAL BIOPSY Left 11/13/2019   Procedure: PLEURAL BIOPSY;  Surgeon: Grace Isaac, MD;  Location: [place];  Service: Thoracic;  Laterality: Left;   PORTACATH PLACEMENT Left 11/26/2019   Procedure: INSERTION PORT-A-CATH BARD POWERPORT 9.6 FR CATHETER;  Surgeon: Grace Isaac, MD;  Location: [place];  Service: Thoracic;  Laterality: Left;   PROSTATE SURGERY     REMOVAL OF PLEURAL DRAINAGE CATHETER Left 11/26/2019   Procedure: REMOVAL OF PLEURAL DRAINAGE CATHETER;  Surgeon: Grace Isaac, MD;  Location: [place];  Service: Thoracic;  Laterality: Left;   SPINE SURGERY     TALC PLEURODESIS Left 11/13/2019   Procedure: possible TALC PLEURADESIS;  Surgeon: Grace Isaac, MD;  Location: [place];  Service:  Thoracic;  Laterality: Left;   TOTAL KNEE ARTHROPLASTY  02/01/2012   Procedure: TOTAL KNEE ARTHROPLASTY;  Surgeon: Sydnee Cabal, MD;  Location: [facility];  Service: Orthopedics;  Laterality: Left;   VIDEO ASSISTED THORACOSCOPY Left 11/13/2019   Procedure: VIDEO ASSISTED THORACOSCOPY;  Surgeon: Grace Isaac, MD;  Location: [facility] OR;  Service: Thoracic;  Laterality: Left;   VIDEO BRONCHOSCOPY N/A 11/13/2019   Procedure: VIDEO BRONCHOSCOPY;  Surgeon: Grace Isaac, MD;  Location: [facility] OR;  Service: Thoracic;  Laterality: N/A;    Social History   Socioeconomic History   Marital status: Divorced    Spouse name: Not on file   Number of children: 2   Years of education: 12   Highest education level: Not on file  Occupational History   Occupation: retired    Comment: [employer]   Tobacco Use   Smoking status: Former  Years: 15.00    Types: Cigarettes    Quit date: 02/15/1981    Years since quitting: 40.1   Smokeless tobacco: Former    Types: Chew    Quit date: 12/2018  Vaping Use   Vaping Use: Never used  Substance and Sexual Activity   Alcohol use: Yes    Alcohol/week: 3.0 standard drinks    Types: 3 Shots of liquor per week   Drug use: No   Sexual activity: Not on file  Other Topics Concern   Not on file  Social History Narrative   Not on file   Social Determinants of Health   Financial Resource Strain: Not on file  Food Insecurity: Not on file  Transportation Needs: Not on file  Physical Activity: Not on file  Stress: Not on file  Social Connections: Not on file  Intimate Partner Violence: Not on file    Family History  Problem Relation Age of Onset   Cancer Sister        brain tumor   Heart disease Brother    Alzheimer's disease Brother    Heart disease Sister    Alzheimer's disease Father    Hypertension Sister    Colon cancer Neg Hx    Colon polyps Neg Hx    Esophageal cancer Neg Hx    Rectal cancer Neg Hx    Stomach cancer Neg Hx       Review of Systems  Constitutional: Negative.  Negative for chills and fever.  HENT: Negative.  Negative for congestion and sore throat.   Respiratory: Negative.  Negative for cough and shortness of breath.   Cardiovascular: Negative.  Negative for chest pain and palpitations.  Gastrointestinal:  Negative for abdominal pain, diarrhea, nausea and vomiting.  Genitourinary: Negative.   Skin: Negative.  Negative for rash.  Neurological: Negative.  Negative for dizziness and headaches.  All other systems reviewed and are negative. Today's Vitals   04/07/21 1105  BP: 132/62  Pulse: 88  SpO2: 95%  Weight: 191 lb (86.6 kg)  Height: 5\' 7"  (1.702 m)   Body mass index is 29.91 kg/m.   Physical Exam Vitals reviewed.  Constitutional:      Appearance: Normal appearance.  HENT:     Head: Normocephalic.  Eyes:     Extraocular Movements: Extraocular movements intact.     Pupils: Pupils are equal, round, and reactive to light.  Cardiovascular:     Rate and Rhythm: Normal rate and regular rhythm.     Pulses: Normal pulses.     Heart sounds: Normal heart sounds.  Pulmonary:     Effort: Pulmonary effort is normal.     Breath sounds: Normal breath sounds.  Abdominal:     General: There is no distension.     Palpations: Abdomen is soft.     Tenderness: There is no abdominal tenderness.  Musculoskeletal:     Cervical back: No tenderness.  Lymphadenopathy:     Cervical: No cervical adenopathy.  Neurological:     Mental Status: He is alert.     ASSESSMENT & PLAN: A total of 45 minutes was spent with the patient and counseling/coordination of care regarding preparing for this visit, reviewing most recent office visit notes, review of discharge summary from the emergency department visit, review of most recent blood work results, review of most recent imaging reports, review of all medications, treatment of neuropathic pain with Lyrica 50 mg twice a day, prognosis, documentation and  need for follow-up.  Problem  List Items Addressed This Visit       Cardiovascular and Mediastinum   Hypertension associated with diabetes ([place])    Well-controlled hypertension and stable.  Continue Hyzaar 100-12.5 mg daily. BP Readings from Last 3 Encounters:  04/07/21 132/62  04/02/21 135/86  03/28/21 (!) 130/94  Well-controlled diabetes.  Continue metformin and Jardiance 10 mg daily. Diet and nutrition discussed. Continue rosuvastatin 20 mg daily.         Respiratory   Mesothelioma of left lung ([place])     Other   Neuropathic pain - Primary    Neuropathic pain most likely associated with prior chest surgery and chest tube insertion as pain is near or at that particular area.  May benefit from Lyrica 50 mg twice a day. Patient not a good candidate for opiates due to side effects and constipation.      Relevant Medications   pregabalin (LYRICA) 50 MG capsule   Patient Instructions  Neuropathic Pain Neuropathic pain is pain caused by damage to the nerves that are responsible for certain sensations in your body (sensory nerves). Neuropathic pain can make you more sensitive to pain. Even a minor sensation can feel very painful. This is usually a long-term (chronic) condition that can be difficult to treat. The type of pain differs from person to person. It may: Start suddenly (acute), or it may develop slowly and become chronic. Come and go as damaged nerves heal, or it may stay at the same level for years. Cause emotional distress, loss of sleep, and a lower quality of life. What are the causes? The most common cause of this condition is diabetes. Many other diseases and conditions can also cause neuropathic pain. Causes of neuropathic pain can be classified as: Toxic. This is caused by medicines and chemicals. The most common causes of toxic neuropathic pain is damage from medicines that kill cancer cells (chemotherapy) or alcohol abuse. Metabolic. This can be caused  by: Diabetes. Lack of vitamins like B12. Traumatic. Any injury that cuts, crushes, or stretches a nerve can cause damage and pain. Compression-related. If a sensory nerve gets trapped or compressed for a long period of time, the blood supply to the nerve can be cut off. Vascular. Many blood vessel diseases can cause neuropathic pain by decreasing blood supply and oxygen to nerves. Autoimmune. This type of pain results from diseases in which the body's defense system (immune system) mistakenly attacks sensory nerves. Examples of autoimmune diseases that can cause neuropathic pain include lupus and multiple sclerosis. Infectious. Many types of viral infections can damage sensory nerves and cause pain. Shingles infection is a common cause of this type of pain. Inherited. Neuropathic pain can be a symptom of many diseases that are passed down through families (genetic). What increases the risk? You are more likely to develop this condition if: You have diabetes. You smoke. You drink too much alcohol. You are taking certain medicines, including chemotherapy or medicines that treat immune system disorders. What are the signs or symptoms? The main symptom is pain. Neuropathic pain is often described as: Burning. Shock-like. Stinging. Hot or cold. Itching. How is this diagnosed? No single test can diagnose neuropathic pain. It is diagnosed based on: A physical exam and your symptoms. Your health care provider will ask you about your pain. You may be asked to use a pain scale to describe how bad your pain is. Tests. These may be done to see if you have a cause and location of any nerve damage. They  include: Nerve conduction studies and electromyography to test how well nerve signals travel through your nerves and muscles (electrodiagnostic testing). Skin biopsy to evaluate for small fiber neuropathy. Imaging studies, such as: X-rays. CT scan. MRI. How is this treated? Treatment for  neuropathic pain may change over time. You may need to try different treatment options or a combination of treatments. Some options include: Treating the underlying cause of the neuropathy, such as diabetes, kidney disease, or vitamin deficiencies. Stopping medicines that can cause neuropathy, such as chemotherapy. Medicine to relieve pain. Medicines may include: Prescription or over-the-counter pain medicine. Anti-seizure medicine. Antidepressant medicines. Pain-relieving patches or creams that are applied to painful areas of skin. A medicine to numb the area (local anesthetic), which can be injected as a nerve block. Transcutaneous nerve stimulation. This uses electrical currents to block painful nerve signals. The treatment is painless. Alternative treatments, such as: Acupuncture. Meditation. Massage. Occupational or physical therapy. Pain management programs. Counseling. Follow these instructions at home: Medicines  Take over-the-counter and prescription medicines only as told by your health care provider. Ask your health care provider if the medicine prescribed to you: Requires you to avoid driving or using machinery. Can cause constipation. You may need to take these actions to prevent or treat constipation: Drink enough fluid to keep your urine pale yellow. Take over-the-counter or prescription medicines. Eat foods that are high in fiber, such as beans, whole grains, and fresh fruits and vegetables. Limit foods that are high in fat and processed sugars, such as fried or sweet foods. Lifestyle  Have a good support system at home. Consider joining a chronic pain support group. Do not use any products that contain nicotine or tobacco. These products include cigarettes, chewing tobacco, and vaping devices, such as e-cigarettes. If you need help quitting, ask your health care provider. Do not drink alcohol. General instructions Learn as much as you can about your  condition. Work closely with all your health care providers to find the treatment plan that works best for you. Ask your health care provider what activities are safe for you. Keep all follow-up visits. This is important. Contact a health care provider if: Your pain treatments are not working. You are having side effects from your medicines. You are struggling with tiredness (fatigue), mood changes, depression, or anxiety. Get help right away if: You have thoughts of hurting yourself. Get help right away if you feel like you may hurt yourself or others, or have thoughts about taking your own life. Go to your nearest emergency room or: Call 911. Call the [facility] at [phone] or 988. This is open 24 hours a day. Text the Crisis Text Line at 801-212-1070. Summary Neuropathic pain is pain caused by damage to the nerves that are responsible for certain sensations in your body (sensory nerves). Neuropathic pain may come and go as damaged nerves heal, or it may stay at the same level for years. Neuropathic pain is usually a long-term condition that can be difficult to treat. Consider joining a chronic pain support group. This information is not intended to replace advice given to you by your health care provider. Make sure you discuss any questions you have with your health care provider. Document Revised: 09/29/2020 Document Reviewed: 09/29/2020 Elsevier Patient Education  2022 [place], MD [facility]

## 2021-04-07 NOTE — Assessment & Plan Note (Signed)
Neuropathic pain most likely associated with prior chest surgery and chest tube insertion as pain is near or at that particular area.  May benefit from Lyrica 50 mg twice a day. Patient not a good candidate for opiates due to side effects and constipation.

## 2021-04-15 ENCOUNTER — Telehealth: Payer: Self-pay | Admitting: Emergency Medicine

## 2021-04-15 NOTE — Telephone Encounter (Signed)
Patient spouse calling in ? ?Says patient recently started taking new med pregabalin (LYRICA) 50 MG capsule ? ?Last 2 days patient has been unable to urinate as normal (not fully urinating..having little drips.Marland Kitchen also lower back pain & burning when trying to urinate) ? ?Wants to know if this is caused by med ? ?Please FU [phone] ?

## 2021-04-15 NOTE — Telephone Encounter (Signed)
Most likely caused by infection and not medication.  Needs to be seen at urgent care center to rule out UTI.  Thanks.

## 2021-04-16 NOTE — Telephone Encounter (Signed)
Called, spoke with pt and advised him of the provider's recommendations. ?

## 2021-04-17 ENCOUNTER — Ambulatory Visit
Admission: EM | Admit: 2021-04-17 | Discharge: 2021-04-17 | Disposition: A | Discharge status: Home / Self Care | Payer: PPO | Attending: Physician Assistant | Admitting: Physician Assistant

## 2021-04-17 ENCOUNTER — Other Ambulatory Visit: Payer: Self-pay

## 2021-04-17 DIAGNOSIS — R109 Unspecified abdominal pain: Secondary | ICD-10-CM | POA: Diagnosis not present

## 2021-04-17 LAB — POCT URINALYSIS DIP (MANUAL ENTRY)
Bilirubin, UA: NEGATIVE
Blood, UA: NEGATIVE
Glucose, UA: >=1000 mg/dL — AB
Ketones, POC UA: NEGATIVE mg/dL
Leukocytes, UA: NEGATIVE
Nitrite, UA: NEGATIVE
Protein Ur, POC: 30 mg/dL — AB
Spec Grav, UA: 1.01 (ref 1.010–1.025)
Urobilinogen, UA: 2 E.U./dL — AB
pH, UA: 6 (ref 5.0–8.0)

## 2021-04-17 LAB — POCT FASTING CBG KUC MANUAL ENTRY: POCT Glucose (KUC): 122 mg/dL — AB (ref 70–99)

## 2021-04-17 NOTE — ED Provider Notes (Signed)
[facility]    CSN: [id number] Arrival date & time: 04/17/21  0913      History   Chief Complaint Chief Complaint  Patient presents with   Flank Pain    Bilateral     HPI Jack KANAAN is a 81 y.o. male.   Patient here today for evaluation of sharp bilateral flank pain with left side greater than right this been ongoing for the last 3 days.  Per patient and wife patient did have an issue with his bowel recently, and has been treated with Lyrica in hopes to resolve this particular type of pain however Lyrica has not been very helpful.  He contacted his PCP today to determine next steps for evaluation and PCP recommended further evaluation in urgent care to rule out urinary abnormality.  He denies any hematuria or urinary urgency but does state that he feels that when he urinates the volume is lower than typical compared for what his intake is.  He denies any fever.  He has not had nausea or vomiting.  The history is provided by the patient.  Flank Pain Associated symptoms include abdominal pain.   Past Medical History:  Diagnosis Date   Arthritis    Cancer ([place])    hx of prostatae cancer   Dyspnea    until chest tube placed   Hypertension    Mesothelioma [facility])     Patient Active Problem List   Diagnosis Date Noted   Neuropathic pain 04/07/2021   Diverticulitis of colon with perforation 03/17/2021   DISH (diffuse idiopathic skeletal hyperostosis) 03/12/2021   Sinusitis 02/19/2021   Type 2 diabetes mellitus with hyperglycemia, without long-term current use of insulin ([place]) 06/11/2020   Hypertension associated with diabetes ([place]) 02/28/2020   Port-A-Cath in place 01/09/2020   Mesothelioma of left lung ([place]) 11/23/2019   Encounter for antineoplastic chemotherapy 11/23/2019   S/P Left VATS drainage of pleural effusion, pleural biopsy, placement of Pleur-x catheter 11/15/2019   Atherosclerotic cardiovascular disease 10/29/2019   Pleural effusion 10/29/2019    History of prostate cancer 10/29/2019   Essential hypertension 10/09/2019   Abnormal EKG 01/02/2015   Prostate cancer ([place]) 11/01/2011    Past Surgical History:  Procedure Laterality Date   APPENDECTOMY     BACK SURGERY  1998   disectomy   CHEST TUBE INSERTION Left 11/13/2019   Procedure: INSERTION PLEURAL DRAINAGE CATHETER;  Surgeon: Grace Isaac, MD;  Location: [place];  Service: Thoracic;  Laterality: Left;   EYE SURGERY Left 1965   injury   IR [place]  05/27/2020   JOINT REPLACEMENT Left 2010   left knee replacement   KNEE SURGERY     PLEURAL BIOPSY Left 11/13/2019   Procedure: PLEURAL BIOPSY;  Surgeon: Grace Isaac, MD;  Location: [place];  Service: Thoracic;  Laterality: Left;   PORTACATH PLACEMENT Left 11/26/2019   Procedure: INSERTION PORT-A-CATH BARD POWERPORT 9.6 FR CATHETER;  Surgeon: Grace Isaac, MD;  Location: [place];  Service: Thoracic;  Laterality: Left;   PROSTATE SURGERY     REMOVAL OF PLEURAL DRAINAGE CATHETER Left 11/26/2019   Procedure: REMOVAL OF PLEURAL DRAINAGE CATHETER;  Surgeon: Grace Isaac, MD;  Location: [place];  Service: Thoracic;  Laterality: Left;   SPINE SURGERY     TALC PLEURODESIS Left 11/13/2019   Procedure: possible TALC PLEURADESIS;  Surgeon: Grace Isaac, MD;  Location: [place];  Service: Thoracic;  Laterality: Left;   TOTAL KNEE  ARTHROPLASTY  02/01/2012   Procedure: TOTAL KNEE ARTHROPLASTY;  Surgeon: Sydnee Cabal, MD;  [facility];  Service: Orthopedics;  Laterality: Left;   VIDEO ASSISTED THORACOSCOPY Left 11/13/2019   Procedure: VIDEO ASSISTED THORACOSCOPY;  Surgeon: Grace Isaac, MD;  Location: [place];  Service: Thoracic;  Laterality: Left;   VIDEO BRONCHOSCOPY N/A 11/13/2019   Procedure: VIDEO BRONCHOSCOPY;  Surgeon: Grace Isaac, MD;  Location: [facility];  Service: Thoracic;  Laterality: N/A;       Home Medications    Prior to Admission medications   Medication Sig  Start Date End Date Taking? Authorizing Provider  acetaminophen (TYLENOL) 500 MG tablet Take 2 tablets (1,000 mg total) by mouth every 6 (six) hours as needed for mild pain or fever. Patient taking differently: Take 500 mg by mouth every 6 (six) hours as needed for mild pain or fever. 11/15/19   Barrett, Lodema Hong, PA-C  aspirin EC 81 MG tablet Take 81 mg by mouth daily. Swallow whole.    [provider]  calcium carbonate (TUMS - DOSED IN MG ELEMENTAL CALCIUM) 500 MG chewable tablet Chew 1,000 mg by mouth daily as needed for indigestion or heartburn.    [provider]  Cetirizine HCl (ZYRTEC ALLERGY) 10 MG CAPS Zyrtec 10 mg capsule  Take by oral route.    [provider]  cyclobenzaprine (FLEXERIL) 5 MG tablet Take 1 tablet (5 mg total) by mouth 3 (three) times daily as needed for muscle spasms. 03/12/21   Ailene Ards, NP  dexamethasone (DECADRON) 4 MG tablet TAKE 1 TABLET BY MOUTH TWICE DAILY THE DAY BEFORE, DAY OF AND DAY AFTER CHEMOTHERAPY EVERY 3 WEEKS 09/17/20   Curt Bears, MD  docusate sodium (COLACE) 100 MG capsule Take 1 capsule (100 mg total) by mouth every 12 (twelve) hours. 03/28/21   Carlisle Cater, PA-C  ferrous sulfate 325 (65 FE) MG tablet Take 325 mg by mouth daily with breakfast.    [provider]  folic acid (FOLVITE) 1 MG tablet TAKE 1 TABLET BY MOUTH EVERY DAY 02/11/21   Heilingoetter, Cassandra L, PA-C  JARDIANCE 10 MG TABS tablet TAKE 1 TABLET BY MOUTH DAILY BEFORE BREAKFAST. 04/07/21   [name], MD  lidocaine-prilocaine (EMLA) cream Apply to Port-A-Cath site 30-60-minute before treatment. 09/04/20   Curt Bears, MD  losartan-hydrochlorothiazide [facility]) 100-12.5 MG tablet TAKE 1 TABLET BY MOUTH EVERY DAY 01/03/21   [name], MD  metFORMIN (GLUCOPHAGE) 500 MG tablet TAKE 1 TABLET BY MOUTH 2 TIMES DAILY WITH A MEAL. 03/11/21   [name], MD  mometasone (ELOCON) 0.1 % cream Apply topically 2 (two) times  daily. 01/13/21   [provider]  omeprazole (PRILOSEC) 20 MG capsule Take 20 mg by mouth daily.    [provider]  ondansetron (ZOFRAN ODT) 4 MG disintegrating tablet Take 1 tablet (4 mg total) by mouth every 8 (eight) hours as needed for nausea or vomiting. Starting 3 days after chemotherapy 02/28/20   Heilingoetter, Cassandra L, PA-C  oxyCODONE (OXY IR/ROXICODONE) 5 MG immediate release tablet Take 1 tablet (5 mg total) by mouth every 6 (six) hours as needed for severe pain. 03/28/21   Carlisle Cater, PA-C  pregabalin (LYRICA) 50 MG capsule Take 1 capsule (50 mg total) by mouth 2 (two) times daily for 14 days. 04/07/21 04/21/21  [name], MD  prochlorperazine (COMPAZINE) 10 MG tablet TAKE 1 TABLET BY MOUTH EVERY 6 HOURS AS NEEDED FOR NAUSEA OR VOMITING. 09/17/20  Curt Bears, MD  rosuvastatin (CRESTOR) 20 MG tablet TAKE 1 TABLET BY MOUTH EVERY DAY 08/01/20   O'Neal, Cassie Freer, MD  simethicone (MYLICON) 903 MG chewable tablet Chew 125 mg by mouth daily as needed (gas).    [provider]    Family History Family History  Problem Relation Age of Onset   Cancer Sister        brain tumor   Heart disease Brother    Alzheimer's disease Brother    Heart disease Sister    Alzheimer's disease Father    Hypertension Sister    Colon cancer Neg Hx    Colon polyps Neg Hx    Esophageal cancer Neg Hx    Rectal cancer Neg Hx    Stomach cancer Neg Hx     Social History Social History   Tobacco Use   Smoking status: Former    Years: 15.00    Types: Cigarettes    Quit date: 02/15/1981    Years since quitting: 40.1   Smokeless tobacco: Former    Types: Chew    Quit date: 12/2018  Vaping Use   Vaping Use: Never used  Substance Use Topics   Alcohol use: Yes    Alcohol/week: 3.0 standard drinks    Types: 3 Shots of liquor per week   Drug use: No     Allergies   Patient has no known allergies.   Review of Systems Review of Systems   Constitutional:  Negative for chills and fever.  Eyes:  Negative for discharge and redness.  Gastrointestinal:  Positive for abdominal pain. Negative for nausea and vomiting.  Genitourinary:  Positive for flank pain. Negative for dysuria, frequency and urgency.  Skin:  Positive for color change and wound.  Neurological:  Negative for numbness.    Physical Exam Triage Vital Signs ED Triage Vitals  Enc Vitals Group     BP 04/17/21 1023 109/73     Pulse Rate 04/17/21 1023 95     Resp 04/17/21 1023 18     Temp 04/17/21 1023 97.9 F (36.6 C)     Temp Source 04/17/21 1023 Oral     SpO2 04/17/21 1023 94 %     Weight --      Height --      Head Circumference --      Peak Flow --      Pain Score 04/17/21 1026 7     Pain Loc --      Pain Edu? --      Excl. in [place]? --    No data found.  Updated Vital Signs BP 109/73 (BP Location: Left Arm)    Pulse 95    Temp 97.9 F (36.6 C) (Oral)    Resp 18    SpO2 94%      Physical Exam Vitals and nursing note reviewed.  Constitutional:      General: He is not in acute distress.    Appearance: Normal appearance. He is not ill-appearing.  HENT:     Head: Normocephalic and atraumatic.  Eyes:     Conjunctiva/sclera: Conjunctivae normal.  Cardiovascular:     Rate and Rhythm: Normal rate.  Pulmonary:     Effort: Pulmonary effort is normal.  Neurological:     Mental Status: He is alert.  Psychiatric:        Mood and Affect: Mood normal.        Behavior: Behavior normal.        Thought Content:  Thought content normal.     UC Treatments / Results  Labs (all labs ordered are listed, but only abnormal results are displayed) Labs Reviewed  POCT URINALYSIS DIP (MANUAL ENTRY) - Abnormal; Notable for the following components:      Result Value   Color, UA yellow (*)    Glucose, UA >=1,000 (*)    Protein Ur, POC =30 (*)    Urobilinogen, UA 2.0 (*)    All other components within normal limits  POCT FASTING CBG [facility] MANUAL ENTRY -  Abnormal; Notable for the following components:   POCT Glucose ([facility]) 122 (*)    All other components within normal limits    EKG   Radiology No results found.  Procedures Procedures (including critical care time)  Medications Ordered in UC Medications - No data to display  Initial Impression / Assessment and Plan / UC Course  I have reviewed the triage vital signs and the nursing notes.  Pertinent labs & imaging results that were available during my care of the patient were reviewed by me and considered in my medical decision making (see chart for details).    Patient describes pain as wrapping from left flank to left lower abdomen. This does not seem to be a new symptom for him and patient and wife seem more concerned that symptoms are lingering despite treatment. I reassured that urine did not show any signs of infection or microscopic hematuria and recommended further advice from PCP given significant past medical history, multiple co-morbidities. Patient and wife are agreeable with plan.   Final Clinical Impressions(s) / UC Diagnoses   Final diagnoses:  Flank pain   Discharge Instructions   None    ED Prescriptions   None    PDMP not reviewed this encounter.   Francene Finders, PA-C 04/17/21 1105

## 2021-04-17 NOTE — ED Triage Notes (Signed)
3 day h/o "sharp"  bilateral flank pain, left side greater than right. Notes some abdominal pain. Denies urinary frequency noting that he is using it less.  ?No hematuria or urinary urgency.  ? ?Pt called PCP who advised him to come to UC for a UA.  ?

## 2021-04-20 ENCOUNTER — Telehealth: Payer: Self-pay

## 2021-04-20 NOTE — Telephone Encounter (Signed)
Patient's wife called to get advice on the pain the patient has been experiencing and what they need to do. The patient has been having a lot more flank pain mainly on the left side. They have seen their PCP and gone to urgent care, but have received no answers. Dr. Julien Nordmann suggested the patient see his GI doctor since the patient has a history of diverticulitis. I relayed this information to the wife and she verbalized understanding and had no further issues or concerns.  ?

## 2021-04-23 ENCOUNTER — Inpatient Hospital Stay: Payer: PPO | Admitting: Dietician

## 2021-04-23 ENCOUNTER — Inpatient Hospital Stay: Payer: PPO

## 2021-04-23 ENCOUNTER — Inpatient Hospital Stay: Payer: PPO | Admitting: Internal Medicine

## 2021-04-24 ENCOUNTER — Telehealth: Payer: Self-pay | Admitting: Emergency Medicine

## 2021-04-24 DIAGNOSIS — M792 Neuralgia and neuritis, unspecified: Secondary | ICD-10-CM

## 2021-04-24 MED ORDER — PREGABALIN 50 MG PO CAPS: 50.0000 mg | ORAL_CAPSULE | Freq: Two times a day (BID) | ORAL | 3 refills | Status: DC

## 2021-04-24 NOTE — Telephone Encounter (Signed)
Ok this is refilled ?

## 2021-04-24 NOTE — Telephone Encounter (Signed)
Pts states he has been experiencing more flank pain mainly on the left side.  ? ?Pt states he has seen provider and has went to UC for pain but have received no answers.  ? ?Pt states Dr. Julien Nordmann recommended he see his GI doctor since the patient has a history of diverticulitis.  ? ?Pt states he has an appt on 3-14 w/ GI but requesting a c/b for recommendations on how to treat the pain until his appt ?

## 2021-04-24 NOTE — Telephone Encounter (Signed)
Patient has been notified and verbalizes understanding ?

## 2021-04-24 NOTE — Telephone Encounter (Signed)
Pt stating things that made him feel better in the past for this lank pain mainly on the left side where he has had chest tubes in at one point is steroid medication, Lyrica has helped the pain. The pt has stated Dr. Mitchel Honour gave him the Lyrica due to believing this left sided flank pain is nerve damage from his chest tubes he had in. Pt has finished the Lyrica x3 days ago and is needing a refill to help him as he is in a lot of pain and does not have anything to help during the day as the pain meds make him sleep so he only takes that at night. Biofreeze has helped as well when topically applied to the area and is needing a rx for that as well. ?

## 2021-04-29 ENCOUNTER — Ambulatory Visit: Payer: PPO | Admitting: Physician Assistant

## 2021-04-29 ENCOUNTER — Encounter: Payer: Self-pay | Admitting: Physician Assistant

## 2021-04-29 VITALS — BP 100/60 | HR 117 | Ht 67.0 in | Wt 189.0 lb

## 2021-04-29 DIAGNOSIS — R1084 Generalized abdominal pain: Secondary | ICD-10-CM

## 2021-04-29 MED ORDER — HYOSCYAMINE SULFATE 0.125 MG SL SUBL: 0.1250 mg | SUBLINGUAL_TABLET | Freq: Four times a day (QID) | SUBLINGUAL | 0 refills | Status: DC

## 2021-04-29 NOTE — Progress Notes (Signed)
? ?Chief Complaint: Chronic left lower quadrant pain ? ?HPI: ?   Jack Haynes is an 81 year old male with a past medical history as listed below including prostate cancer and diverticulitis with microperforation in January, known to Dr. Carlean Purl, who was referred to me by [name], * for complaint of chronic left lower quadrant pain. ?   03/11/2015 colonoscopy with severe diverticulosis in the left colon and otherwise normal.  Discussed that routine repeat was not necessary after that point. ?   03/17/2021 CT the abdomen pelvis with contrast showed sequelae of recent sigmoid diverticulitis related microperforation with small foci of extraluminal air in the left lower quadrant, no active inflammatory changes or colonic wall thickening.  No abscess. ?   03/28/2021 CT the abdomen pelvis with contrast showed resolution of previously seen extraluminal air in the left lower quadrant, diverticulosis of the left colon but without imaging evidence of diverticulitis, no abscess.  No change in the appearance of malignant pleural disease on the left since the study 11 days prior.  Previous prostatectomy without complicating feature. ?   04/17/2021 patient seen in urgent care with sharp bilateral flank pain the left side greater than the right which have been going on for 3 days.  Apparently been having some issue moving his bowel recently and had been started on Lyrica to help with this pain but it did not really help.  At that time described wrapping pain from left flank to left lower abdomen which did not seem to be a new symptom and the wife is more concerned that symptoms are lingering despite treatment.  The urine looked okay. ?   04/20/2021 patient's wife called oncology with continued left lower pain and Dr. Julien Nordmann suggested he see our clinic for possible GI origin. ?   Today, the patient's wife comes to the visit with him and assist with most of his history.  She explains that he was on chemotherapy for his  mesothelioma once every 3 weeks and then started with pain all over his body and so now they have stopped it for 3 months to see if this helps.  Patient tells me that he has pain that is just "all over and seems very surface level" and at times is very tingly and burning/stinging.  They tell me they have tried various things to help with this, most recently Lyrica has been added but this makes him somewhat confused, dizzy and drowsy.  It may be helps a little bit.  Apparently the pain gets worse if he tries to lay down so he sleeps in his reclining chair.  They have also used creams such as Biofreeze etc. and these have helped with the pain but not completely take it away.  Patient's wife does tell me that he was on a steroid for 2 days prior to his last chemotherapy and everything got better, but then slowly came back.  They have also tried pain medications but he hallucinates with them and they do not completely take away the pain.  Also describes some pain in his abdomen sometimes worse in the left lower quadrant but seems to come and go, sometimes worse at the end of the day, this seems a little bit better after a bowel movement.  He is maintaining regular stools with nightly stool softener. ?   Denies fever, chills, weight loss, change in appetite, heartburn, reflux or blood in his stool. ? ?Past Medical History:  ?Diagnosis Date  ? Arthritis   ? Cancer (  [place])   ? hx of prostatae cancer  ? Dyspnea   ? until chest tube placed  ? Hypertension   ? Mesothelioma [facility])   ? ? ?Past Surgical History:  ?Procedure Laterality Date  ? APPENDECTOMY    ? [place]  ? disectomy  ? CHEST TUBE INSERTION Left 11/13/2019  ? Procedure: INSERTION PLEURAL DRAINAGE CATHETER;  Surgeon: Grace Isaac, MD;  Location: [place];  Service: Thoracic;  Laterality: Left;  ? [place]  ? injury  ? IR PORT REPAIR CENTRAL VENOUS ACCESS DEVICE  05/27/2020  ? JOINT REPLACEMENT Left 2010  ? left knee replacement  ? KNEE SURGERY     ? PLEURAL BIOPSY Left 11/13/2019  ? Procedure: PLEURAL BIOPSY;  Surgeon: Grace Isaac, MD;  Location: [place];  Service: Thoracic;  Laterality: Left;  ? PORTACATH PLACEMENT Left 11/26/2019  ? Procedure: INSERTION PORT-A-CATH BARD POWERPORT 9.6 FR CATHETER;  Surgeon: Grace Isaac, MD;  Location: [place];  Service: Thoracic;  Laterality: Left;  ? PROSTATE SURGERY    ? REMOVAL OF PLEURAL DRAINAGE CATHETER Left 11/26/2019  ? Procedure: REMOVAL OF PLEURAL DRAINAGE CATHETER;  Surgeon: Grace Isaac, MD;  Location: [place];  Service: Thoracic;  Laterality: Left;  ? SPINE SURGERY    ? TALC PLEURODESIS Left 11/13/2019  ? Procedure: possible TALC PLEURADESIS;  Surgeon: Grace Isaac, MD;  Location: [place];  Service: Thoracic;  Laterality: Left;  ? TOTAL KNEE ARTHROPLASTY  02/01/2012  ? Procedure: TOTAL KNEE ARTHROPLASTY;  Surgeon: Sydnee Cabal, MD;  [facility];  Service: Orthopedics;  Laterality: Left;  ? VIDEO ASSISTED THORACOSCOPY Left 11/13/2019  ? Procedure: VIDEO ASSISTED THORACOSCOPY;  Surgeon: Grace Isaac, MD;  Location: [place];  Service: Thoracic;  Laterality: Left;  ? VIDEO BRONCHOSCOPY N/A 11/13/2019  ? Procedure: VIDEO BRONCHOSCOPY;  Surgeon: Grace Isaac, MD;  Location: [place];  Service: Thoracic;  Laterality: N/A;  ? ? ?Current Outpatient Medications  ?Medication Sig Dispense Refill  ? acetaminophen (TYLENOL) 500 MG tablet Take 2 tablets (1,000 mg total) by mouth every 6 (six) hours as needed for mild pain or fever. (Patient taking differently: Take 500 mg by mouth every 6 (six) hours as needed for mild pain or fever.) 30 tablet 0  ? aspirin EC 81 MG tablet Take 81 mg by mouth daily. Swallow whole.    ? calcium carbonate (TUMS - DOSED IN MG ELEMENTAL CALCIUM) 500 MG chewable tablet Chew 1,000 mg by mouth daily as needed for indigestion or heartburn.    ? Cetirizine HCl (ZYRTEC ALLERGY) 10 MG CAPS Zyrtec 10 mg capsule ? Take by oral route.    ? cyclobenzaprine (FLEXERIL) 5 MG tablet  Take 1 tablet (5 mg total) by mouth 3 (three) times daily as needed for muscle spasms. 30 tablet 2  ? dexamethasone (DECADRON) 4 MG tablet TAKE 1 TABLET BY MOUTH TWICE DAILY THE DAY BEFORE, DAY OF AND DAY AFTER CHEMOTHERAPY EVERY 3 WEEKS 40 tablet 1  ? docusate sodium (COLACE) 100 MG capsule Take 1 capsule (100 mg total) by mouth every 12 (twelve) hours. 30 capsule 0  ? ferrous sulfate 325 (65 FE) MG tablet Take 325 mg by mouth daily with breakfast.    ? folic acid (FOLVITE) 1 MG tablet TAKE 1 TABLET BY MOUTH EVERY DAY 90 tablet 1  ? JARDIANCE 10 MG TABS tablet TAKE 1 TABLET BY MOUTH DAILY BEFORE BREAKFAST. 90 tablet 1  ? lidocaine-prilocaine (EMLA) cream  Apply to Port-A-Cath site 30-60-minute before treatment. 30 g 0  ? losartan-hydrochlorothiazide (HYZAAR) 100-12.5 MG tablet TAKE 1 TABLET BY MOUTH EVERY DAY 90 tablet 3  ? metFORMIN (GLUCOPHAGE) 500 MG tablet TAKE 1 TABLET BY MOUTH 2 TIMES DAILY WITH A MEAL. 180 tablet 3  ? mometasone (ELOCON) 0.1 % cream Apply topically 2 (two) times daily.    ? omeprazole (PRILOSEC) 20 MG capsule Take 20 mg by mouth daily.    ? ondansetron (ZOFRAN ODT) 4 MG disintegrating tablet Take 1 tablet (4 mg total) by mouth every 8 (eight) hours as needed for nausea or vomiting. Starting 3 days after chemotherapy 30 tablet 1  ? oxyCODONE (OXY IR/ROXICODONE) 5 MG immediate release tablet Take 1 tablet (5 mg total) by mouth every 6 (six) hours as needed for severe pain. 10 tablet 0  ? pregabalin (LYRICA) 50 MG capsule Take 1 capsule (50 mg total) by mouth 2 (two) times daily for 14 days. 28 capsule 3  ? prochlorperazine (COMPAZINE) 10 MG tablet TAKE 1 TABLET BY MOUTH EVERY 6 HOURS AS NEEDED FOR NAUSEA OR VOMITING. 30 tablet 0  ? rosuvastatin (CRESTOR) 20 MG tablet TAKE 1 TABLET BY MOUTH EVERY DAY 90 tablet 3  ? simethicone (MYLICON) 408 MG chewable tablet Chew 125 mg by mouth daily as needed (gas).    ? ?No current facility-administered medications for this visit.  ? ? ?Allergies as of  04/29/2021  ? (No Known Allergies)  ? ? ?Family History  ?Problem Relation Age of Onset  ? Cancer Sister   ?     brain tumor  ? Heart disease Brother   ? Alzheimer's disease Brother   ? Heart disease Sister   ? A

## 2021-04-29 NOTE — Patient Instructions (Addendum)
If you are age 81 or older, your body mass index should be between 23-30. Your Body mass index is 29.6 kg/m?Marland Kitchen If this is out of the aforementioned range listed, please consider follow up with your Primary Care Provider. ? ?The [facility] providers would like to encourage you to use [facility] to communicate with providers for non-urgent requests or questions.  Due to long hold times on the telephone, sending your provider a message by [facility] may be a faster and more efficient way to get a response.  Please allow 48 business hours for a response.  Please remember that this is for non-urgent requests.  ?_______________________________________________________ ? ?We have sent the following medications to your pharmacy for you to pick up at your convenience: ? ?START: Hyoscyamine 0.125 mg take 1 tablet sublingual (under the tongue) every 6 hours ? ?You will follow up with our office on an as needed basis. ? ?Thank you for entrusting me with your care and choosing [facility]. ? ?Ellouise Newer, PA-C ?

## 2021-05-14 ENCOUNTER — Ambulatory Visit: Payer: PPO

## 2021-05-14 ENCOUNTER — Other Ambulatory Visit: Payer: PPO

## 2021-05-14 ENCOUNTER — Other Ambulatory Visit: Payer: Self-pay

## 2021-05-14 ENCOUNTER — Inpatient Hospital Stay: Payer: PPO | Attending: Internal Medicine

## 2021-05-14 ENCOUNTER — Ambulatory Visit: Payer: PPO | Admitting: Internal Medicine

## 2021-05-14 DIAGNOSIS — Z95828 Presence of other vascular implants and grafts: Secondary | ICD-10-CM

## 2021-05-14 DIAGNOSIS — C457 Mesothelioma of other sites: Secondary | ICD-10-CM

## 2021-05-14 DIAGNOSIS — Z452 Encounter for adjustment and management of vascular access device: Secondary | ICD-10-CM | POA: Diagnosis not present

## 2021-05-14 DIAGNOSIS — C450 Mesothelioma of pleura: Secondary | ICD-10-CM | POA: Insufficient documentation

## 2021-05-14 MED ORDER — SODIUM CHLORIDE 0.9% FLUSH
10.0000 mL | Freq: Once | INTRAVENOUS | Status: AC
  Administered 2021-05-14: 10 mL

## 2021-05-14 MED ORDER — HEPARIN SOD (PORK) LOCK FLUSH 100 UNIT/ML IV SOLN
500.0000 [IU] | Freq: Once | INTRAVENOUS | Status: AC
  Administered 2021-05-14: 500 [IU]

## 2021-05-25 ENCOUNTER — Other Ambulatory Visit: Payer: Self-pay | Admitting: Nurse Practitioner

## 2021-05-25 DIAGNOSIS — M25512 Pain in left shoulder: Secondary | ICD-10-CM

## 2021-05-25 DIAGNOSIS — R109 Unspecified abdominal pain: Secondary | ICD-10-CM

## 2021-05-30 IMAGING — DX DG CHEST 1V PORT
1 series · 1 of 1 positions shown · non-contrast
Comparison: 11/13/2019

CLINICAL DATA: Follow-up PleurX catheter

EXAM:
PORTABLE CHEST 1 VIEW

[chest ap]
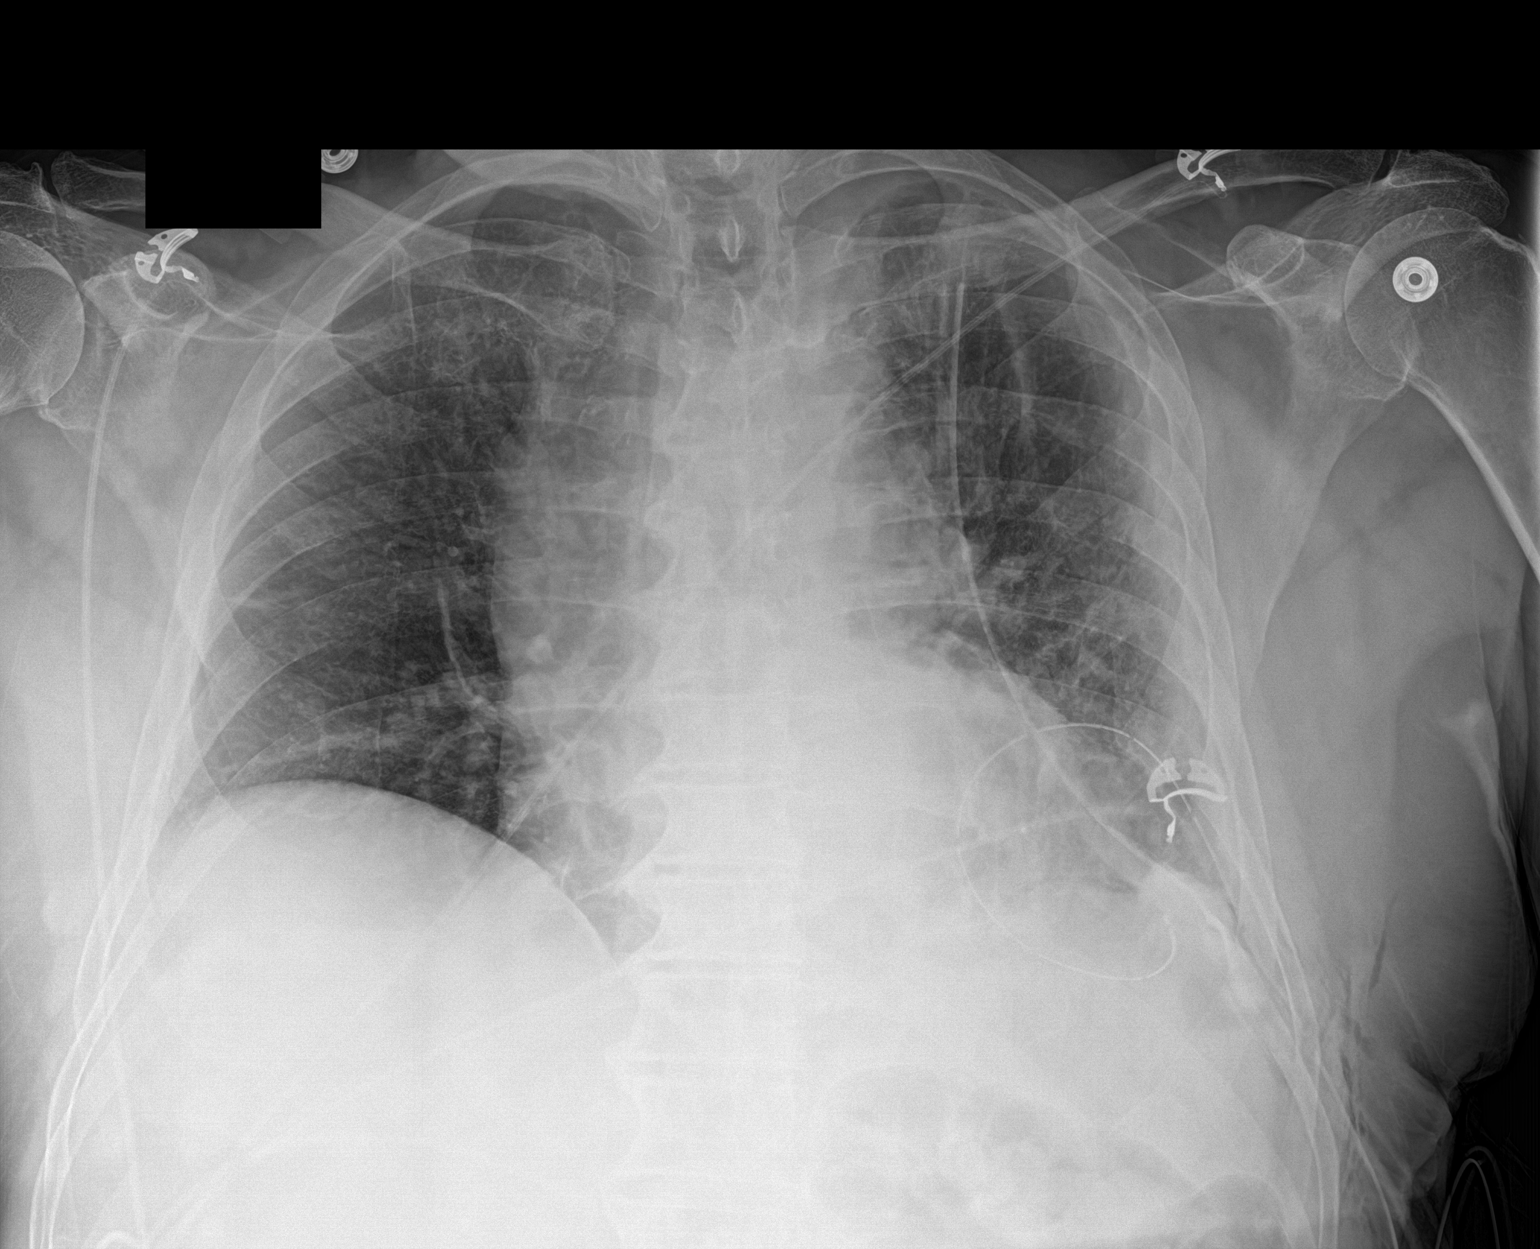

[1 of 1 positions shown; findings below may reference images not displayed]

FINDINGS: Cardiac shadow is mildly prominent but stable. Left-sided PleurX
catheter is seen in satisfactory position. No pneumothorax is noted.
No sizable effusion is seen. Minimal left basilar atelectasis is
noted. Right lung remains clear. Degenerative changes of the
thoracic spine are noted.
IMPRESSION: PleurX catheter in place on the left. No pneumothorax or sizable
effusion is seen. Mild left basilar atelectasis is noted.

## 2021-05-31 IMAGING — DX DG CHEST 1V PORT
1 series · 1 of 1 positions shown · non-contrast
Comparison: 11/14/2019

CLINICAL DATA: Pleural effusion

EXAM:
PORTABLE CHEST 1 VIEW

[chest]
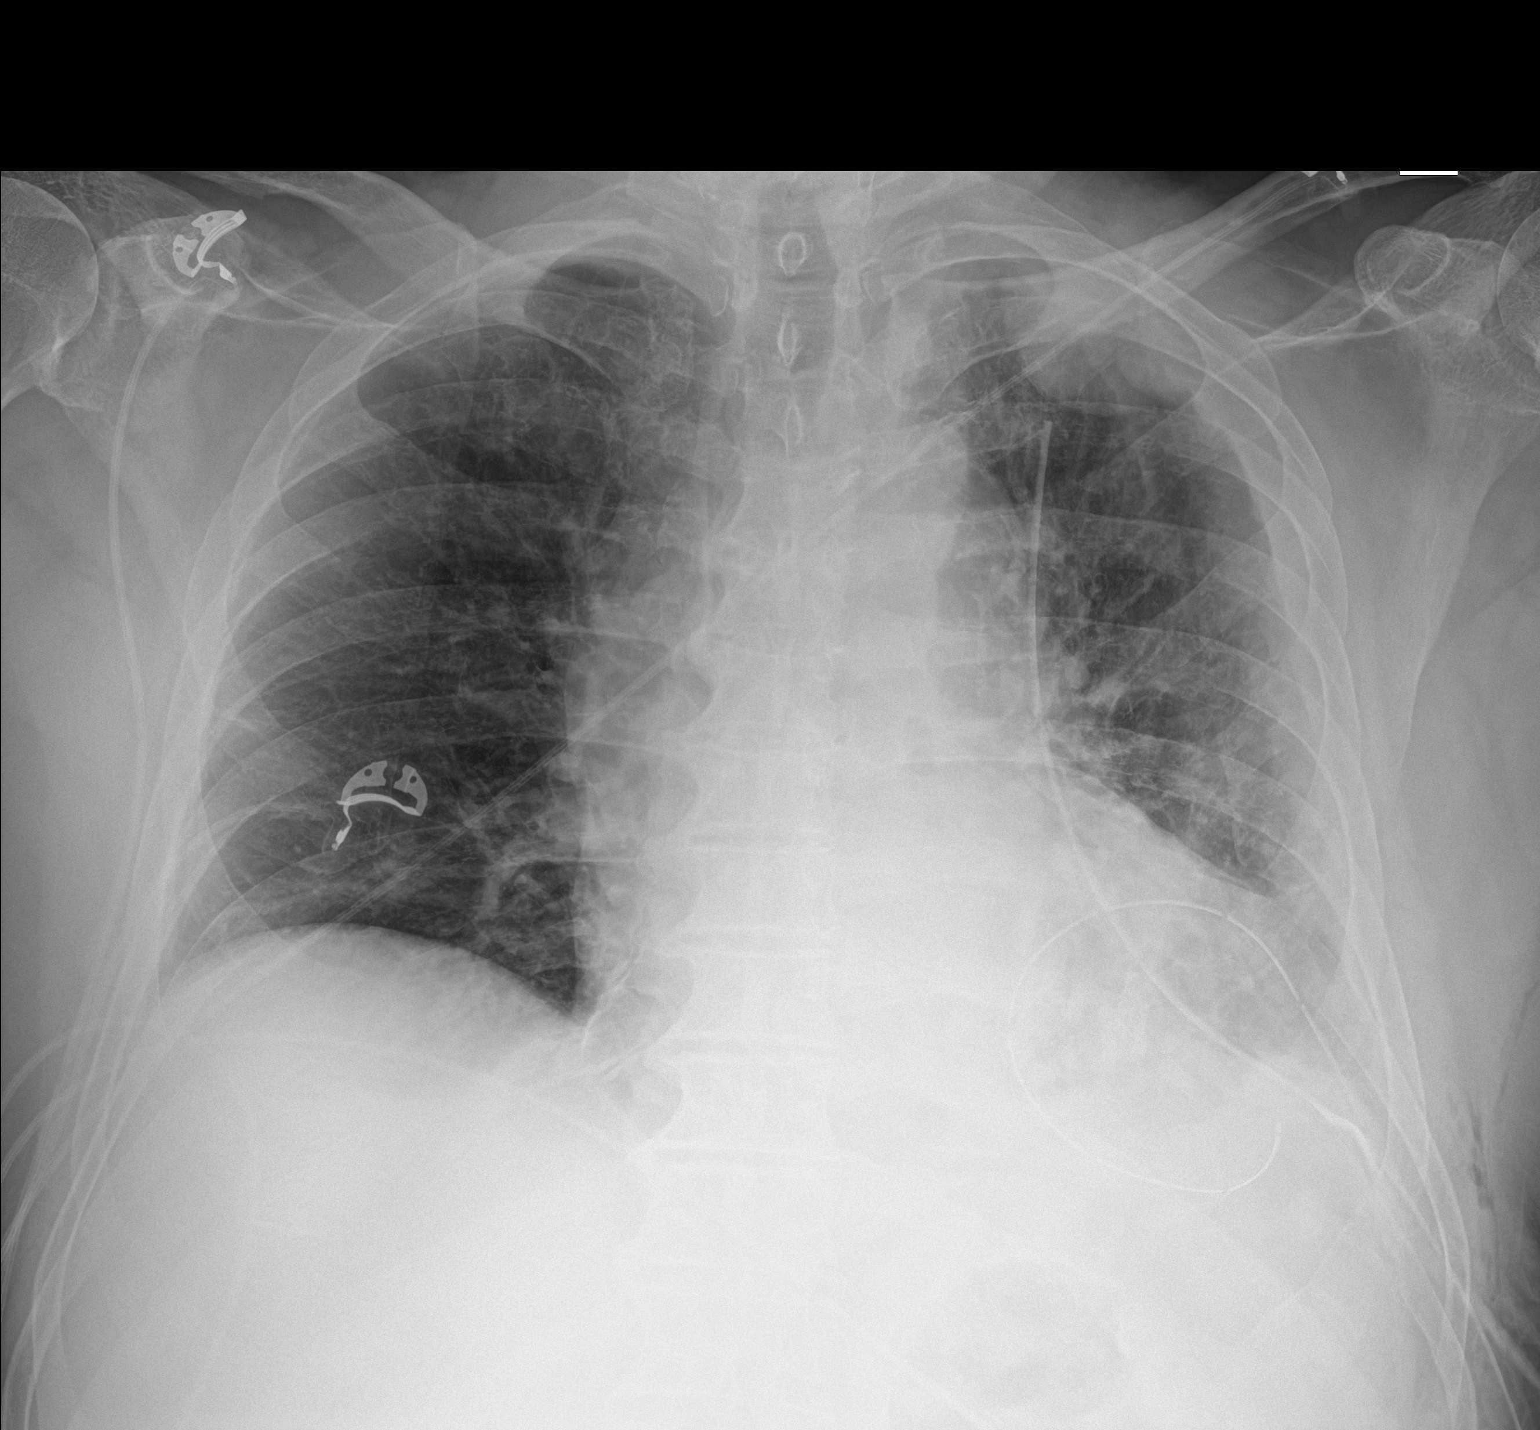

[1 of 1 positions shown; findings below may reference images not displayed]

FINDINGS: Shallow inspiration. Cardiac enlargement. 2 left chest tubes remain
in place without change in position. No visible left pneumothorax.
Mild subcutaneous emphysema along the lateral chest. Nodular opacity
in the left apex and along the left chest wall may represent a
loculated effusion. Degenerative changes in the spine.
IMPRESSION: 1. Cardiac enlargement.
2. Two left chest tubes remain in place. No visible left
pneumothorax.
3. Probable loculated fluid in the left apex and along the left
chest wall.

## 2021-05-31 IMAGING — DX DG CHEST 2V
2 series · 2 of 2 positions shown · non-contrast
Comparison: Same day.

CLINICAL DATA: Chest tube removal.

EXAM:
CHEST - 2 VIEW

[chest pa]
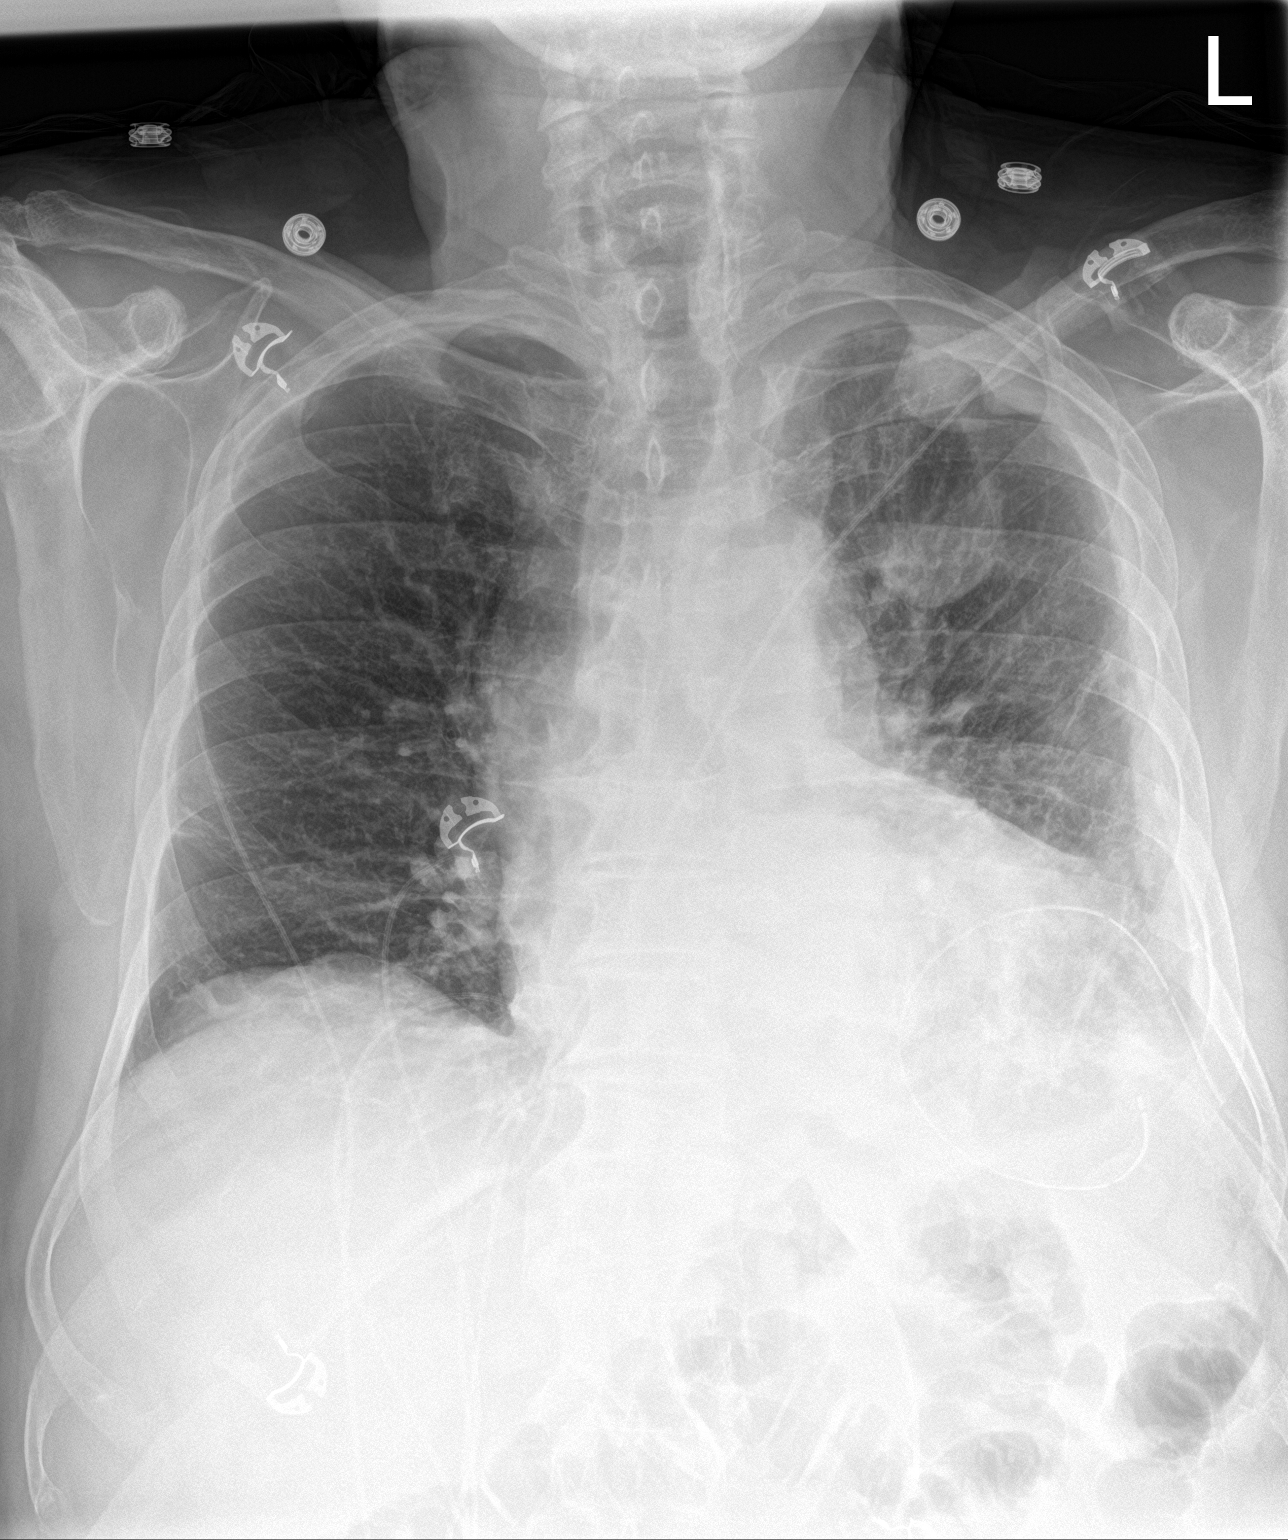

[chest lat]
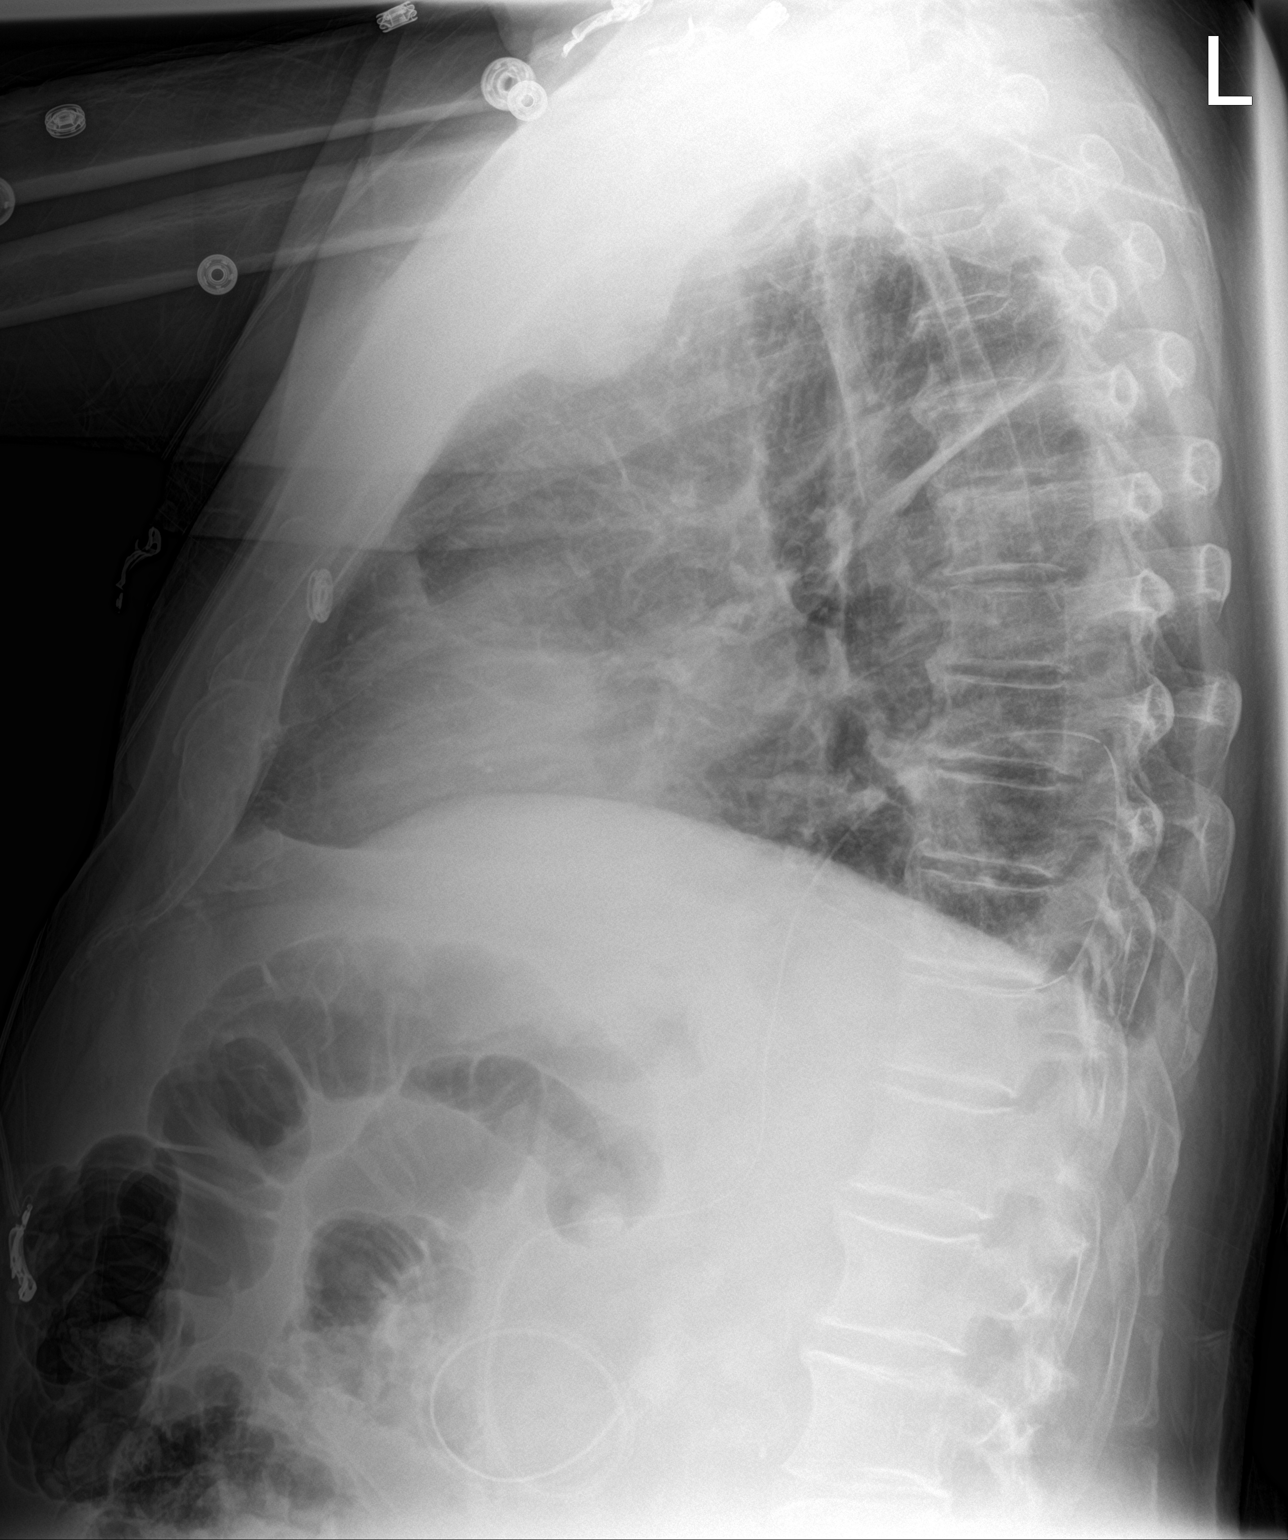

[2 of 2 positions shown; findings below may reference images not displayed]

FINDINGS: Stable cardiomegaly. One left-sided chest tube has been removed.
Remaining left-sided chest tube is noted in the lung base. No
definite pneumothorax is noted. Stable nodular opacities are noted
in the left upper lobe which may represent loculated effusions.
Stable left basilar atelectasis and pleural thickening is noted.
Minimal right basilar subsegmental atelectasis is noted. Bony thorax
is unremarkable.
IMPRESSION: One left-sided chest tube has been removed. Remaining left-sided
chest tube is noted in the lung base. No definite pneumothorax seen.
Stable nodular opacities in the left upper lobe which may represent
loculated effusions.

## 2021-06-06 ENCOUNTER — Other Ambulatory Visit: Payer: Self-pay

## 2021-06-06 ENCOUNTER — Ambulatory Visit
Admission: EM | Admit: 2021-06-06 | Discharge: 2021-06-06 | Disposition: A | Discharge status: Home / Self Care | Payer: PPO | Attending: Urgent Care | Admitting: Urgent Care

## 2021-06-06 DIAGNOSIS — K219 Gastro-esophageal reflux disease without esophagitis: Secondary | ICD-10-CM | POA: Diagnosis not present

## 2021-06-06 DIAGNOSIS — J040 Acute laryngitis: Secondary | ICD-10-CM | POA: Diagnosis not present

## 2021-06-06 DIAGNOSIS — J301 Allergic rhinitis due to pollen: Secondary | ICD-10-CM | POA: Diagnosis not present

## 2021-06-06 MED ORDER — FLUTICASONE PROPIONATE 50 MCG/ACT NA SUSP: 1.0000 | Freq: Every day | NASAL | 0 refills | Status: AC

## 2021-06-06 MED ORDER — PANTOPRAZOLE SODIUM 40 MG PO TBEC: 40.0000 mg | DELAYED_RELEASE_TABLET | Freq: Every day | ORAL | 0 refills | Status: DC

## 2021-06-06 NOTE — Discharge Instructions (Addendum)
Your voice loss is likely due to acute laryngitis, which is a viral infection.  Typically oral medications do not improve response.  Voice should resume within 5 to 7 days. ?There is a chance however that this might also be secondary to postnasal drainage or silent reflux. ?I have called in Flonase which is a nasal spray to help cover for the postnasal drainage from your nose. ?Please inject 1 spray in each nostril twice daily.  Stop if irritation or nosebleeds occur. ?Stop your omeprazole and switch to pantoprazole.  Take this once daily in the morning prior to eating. ?Follow-up with your PCP should symptoms persist. ?If you develop any fever, drooling or neck swelling, please had to the emergency room. ?

## 2021-06-06 NOTE — ED Triage Notes (Signed)
Pt c/o loss of voice x3 days. Wife states has mesothelioma in left lung. States tried salt gargles without relief.  ?

## 2021-06-07 NOTE — ED Provider Notes (Signed)
?[facility] ? ? ? ?CSN: [id number] ?Arrival date & time: 06/06/21  0935 ? ? ?  ? ?History   ?Chief Complaint ?Chief Complaint  ?Patient presents with  ? Laryngitis  ? ? ?HPI ?CHUCK CABAN is a 81 y.o. male.  ? ?Pleasant 81 year old male presents today with his wife due to concerns of loss of voice.  This has been occurring for the past 3 days.  Wife was concerned as he has a history of mesothelioma of the lung, she did not know if they were connected.  Patient denies any pain.  He has had some postnasal drainage and rhinorrhea recently.  Denies any sinus pressure or fever.  He does not have a cough.  He denies shortness of breath or dyspnea on exertion.  He does not have a known cardiac history.  He does have a known history of GERD for which he takes omeprazole.  He does admit that 2 days ago he felt he was having some GERD symptoms.  He otherwise denies ear pain, sore throat.  He had a single episode of " feeling like my food was stuck on the left side of my mouth", but denies dysphagia or odynophagia.  He denies regurgitation of solid food.  He denies a history of thyroid problems or thyroid surgery.  He has never had radiation to the head or neck. ? ? ? ?Past Medical History:  ?Diagnosis Date  ? Arthritis   ? Cancer [facility])   ? hx of prostatae cancer  ? Diverticulitis   ? Dyspnea   ? until chest tube placed  ? Hypertension   ? Mesothelioma [facility])   ? ? ?Patient Active Problem List  ? Diagnosis Date Noted  ? Neuropathic pain 04/07/2021  ? Diverticulitis of colon with perforation 03/17/2021  ? DISH (diffuse idiopathic skeletal hyperostosis) 03/12/2021  ? Sinusitis 02/19/2021  ? Type 2 diabetes mellitus with hyperglycemia, without long-term current use of insulin ([place]) 06/11/2020  ? Hypertension associated with diabetes ([place]) 02/28/2020  ? Port-A-Cath in place 01/09/2020  ? Mesothelioma of left lung ([place]) 11/23/2019  ? Encounter for antineoplastic chemotherapy 11/23/2019  ? S/P Left VATS drainage of  pleural effusion, pleural biopsy, placement of Pleur-x catheter 11/15/2019  ? Atherosclerotic cardiovascular disease 10/29/2019  ? Pleural effusion 10/29/2019  ? History of prostate cancer 10/29/2019  ? Essential hypertension 10/09/2019  ? Abnormal EKG 01/02/2015  ? Prostate cancer ([place]) 11/01/2011  ? ? ?Past Surgical History:  ?Procedure Laterality Date  ? APPENDECTOMY    ? [place]  ? disectomy  ? CHEST TUBE INSERTION Left 11/13/2019  ? Procedure: INSERTION PLEURAL DRAINAGE CATHETER;  Surgeon: Grace Isaac, MD;  Location: [place];  Service: Thoracic;  Laterality: Left;  ? [place]  ? injury  ? IR PORT REPAIR CENTRAL VENOUS ACCESS DEVICE  05/27/2020  ? JOINT REPLACEMENT Left 2010  ? left knee replacement  ? KNEE SURGERY    ? PLEURAL BIOPSY Left 11/13/2019  ? Procedure: PLEURAL BIOPSY;  Surgeon: Grace Isaac, MD;  Location: [place];  Service: Thoracic;  Laterality: Left;  ? PORTACATH PLACEMENT Left 11/26/2019  ? Procedure: INSERTION PORT-A-CATH BARD POWERPORT 9.6 FR CATHETER;  Surgeon: Grace Isaac, MD;  Location: [place];  Service: Thoracic;  Laterality: Left;  ? PROSTATE SURGERY    ? REMOVAL OF PLEURAL DRAINAGE CATHETER Left 11/26/2019  ? Procedure: REMOVAL OF PLEURAL DRAINAGE CATHETER;  Surgeon: Grace Isaac, MD;  Location: [place];  Service: Thoracic;  Laterality: Left;  ? SPINE SURGERY    ? TALC PLEURODESIS Left 11/13/2019  ? Procedure: possible TALC PLEURADESIS;  Surgeon: Grace Isaac, MD;  Location: [place];  Service: Thoracic;  Laterality: Left;  ? TOTAL KNEE ARTHROPLASTY  02/01/2012  ? Procedure: TOTAL KNEE ARTHROPLASTY;  Surgeon: Sydnee Cabal, MD;  Location: [facility];  Service: Orthopedics;  Laterality: Left;  ? VIDEO ASSISTED THORACOSCOPY Left 11/13/2019  ? Procedure: VIDEO ASSISTED THORACOSCOPY;  Surgeon: Grace Isaac, MD;  Location: [place];  Service: Thoracic;  Laterality: Left;  ? VIDEO BRONCHOSCOPY N/A 11/13/2019  ? Procedure: VIDEO BRONCHOSCOPY;  Surgeon:  Grace Isaac, MD;  Location: [place];  Service: Thoracic;  Laterality: N/A;  ? ? ? ? ? ?Home Medications   ? ?Prior to Admission medications   ?Medication Sig Start Date End Date Taking? Authorizing Provider  ?fluticasone (FLONASE) 50 MCG/ACT nasal spray Place 1 spray into both nostrils daily. 06/06/21  Yes Derin Granquist L, PA  ?pantoprazole (PROTONIX) 40 MG tablet Take 1 tablet (40 mg total) by mouth daily. 06/06/21  Yes Derrica Sieg L, PA  ?psyllium (METAMUCIL) 58.6 % powder Take 1 packet by mouth 3 (three) times daily.   Yes [provider]  ?senna (SENOKOT) 8.6 MG tablet Take 1 tablet by mouth daily.   Yes [provider]  ?acetaminophen (TYLENOL) 500 MG tablet Take 2 tablets (1,000 mg total) by mouth every 6 (six) hours as needed for mild pain or fever. ?Patient taking differently: Take 500 mg by mouth every 6 (six) hours as needed for mild pain or fever. 11/15/19   Barrett, Erin R, PA-C  ?aspirin EC 81 MG tablet Take 81 mg by mouth daily. Swallow whole.    [provider]  ?calcium carbonate (TUMS - DOSED IN MG ELEMENTAL CALCIUM) 500 MG chewable tablet Chew 1,000 mg by mouth daily as needed for indigestion or heartburn.    [provider]  ?Cetirizine HCl (ZYRTEC ALLERGY) 10 MG CAPS Zyrtec 10 mg capsule ? Take by oral route.    [provider]  ?ferrous sulfate 325 (65 FE) MG tablet Take 325 mg by mouth daily with breakfast.    [provider]  ?folic acid (FOLVITE) 1 MG tablet TAKE 1 TABLET BY MOUTH EVERY DAY 02/11/21   Heilingoetter, Cassandra L, PA-C  ?JARDIANCE 10 MG TABS tablet TAKE 1 TABLET BY MOUTH DAILY BEFORE BREAKFAST. 04/07/21   [name], MD  ?lidocaine-prilocaine (EMLA) cream Apply to Port-A-Cath site 30-60-minute before treatment. 09/04/20   Curt Bears, MD  ?losartan-hydrochlorothiazide [facility]) 100-12.5 MG tablet TAKE 1 TABLET BY MOUTH EVERY DAY 01/03/21   [name], MD  ?metFORMIN (GLUCOPHAGE) 500 MG tablet  TAKE 1 TABLET BY MOUTH 2 TIMES DAILY WITH A MEAL. 03/11/21   [name], MD  ?ondansetron (ZOFRAN ODT) 4 MG disintegrating tablet Take 1 tablet (4 mg total) by mouth every 8 (eight) hours as needed for nausea or vomiting. Starting 3 days after chemotherapy 02/28/20   Heilingoetter, Cassandra L, PA-C  ?rosuvastatin (CRESTOR) 20 MG tablet TAKE 1 TABLET BY MOUTH EVERY DAY 08/01/20   O'Neal, Cassie Freer, MD  ?simethicone (MYLICON) 270 MG chewable tablet Chew 125 mg by mouth daily as needed (gas).    [provider]  ? ? ?Family History ?Family History  ?Problem Relation Age of Onset  ? Cancer Sister   ?     brain tumor  ? Heart disease Brother   ? Alzheimer's disease  Brother   ? Heart disease Sister   ? Alzheimer's disease Father   ? Hypertension Sister   ? Colon cancer Neg Hx   ? Colon polyps Neg Hx   ? Esophageal cancer Neg Hx   ? Rectal cancer Neg Hx   ? Stomach cancer Neg Hx   ? ? ?Social History ?Social History  ? ?Tobacco Use  ? Smoking status: Former  ?  Years: 15.00  ?  Types: Cigarettes  ?  Quit date: 02/15/1981  ?  Years since quitting: 40.3  ? Smokeless tobacco: Former  ?  Types: Chew  ?  Quit date: 12/2018  ?Vaping Use  ? Vaping Use: Never used  ?Substance Use Topics  ? Alcohol use: Yes  ?  Alcohol/week: 3.0 standard drinks  ?  Types: 3 Shots of liquor per week  ? Drug use: No  ? ? ? ?Allergies   ?Patient has no known allergies. ? ? ?Review of Systems ?Review of Systems  ?HENT:  Positive for voice change.   ?As per hpi ? ?Physical Exam ?Triage Vital Signs ?ED Triage Vitals  ?Enc Vitals Group  ?   BP 06/06/21 1111 (!) 144/84  ?   Pulse Rate 06/06/21 1111 95  ?   Resp 06/06/21 1111 18  ?   Temp 06/06/21 1111 98.8 ?F (37.1 ?C)  ?   Temp Source 06/06/21 1111 Oral  ?   SpO2 06/06/21 1111 95 %  ?   Weight --   ?   Height --   ?   Head Circumference --   ?   Peak Flow --   ?   Pain Score 06/06/21 1112 0  ?   Pain Loc --   ?   Pain Edu? --   ?   Excl. in [place]? --   ? ?No data found. ? ?Updated Vital  Signs ?BP (!) 144/84 (BP Location: Left Arm)   Pulse 95   Temp 98.8 ?F (37.1 ?C) (Oral)   Resp 18   SpO2 95%  ? ?Visual Acuity ?Right Eye Distance:   ?Left Eye Distance:   ?Bilateral Distance:   ? ?Right Eye N

## 2021-06-11 IMAGING — CR DG CHEST 2V
2 series · 2 of 2 positions shown · non-contrast
Comparison: 11/15/2019

CLINICAL DATA: Mesothelioma.

EXAM:
CHEST - 2 VIEW

[w chest pa]
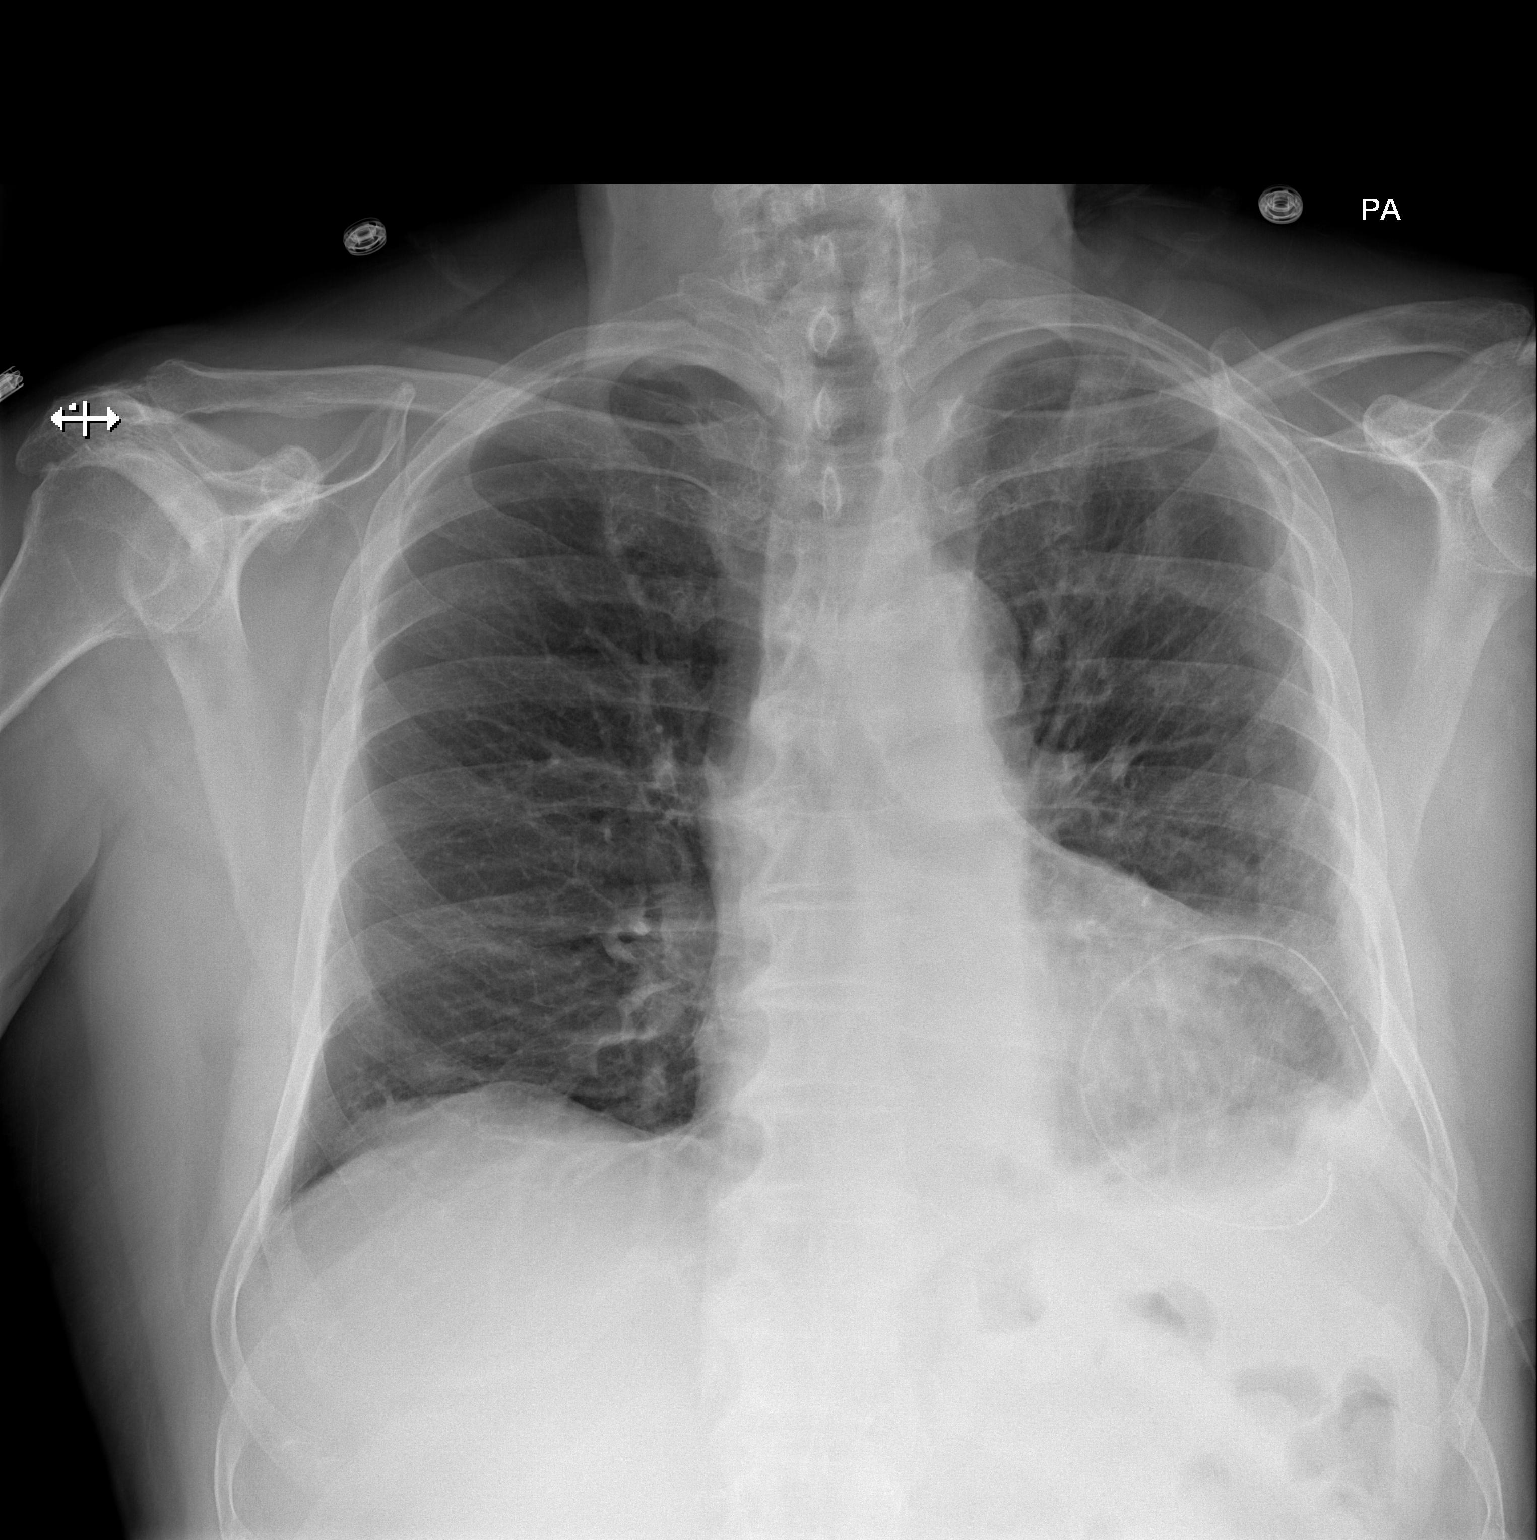

[w chest lat]
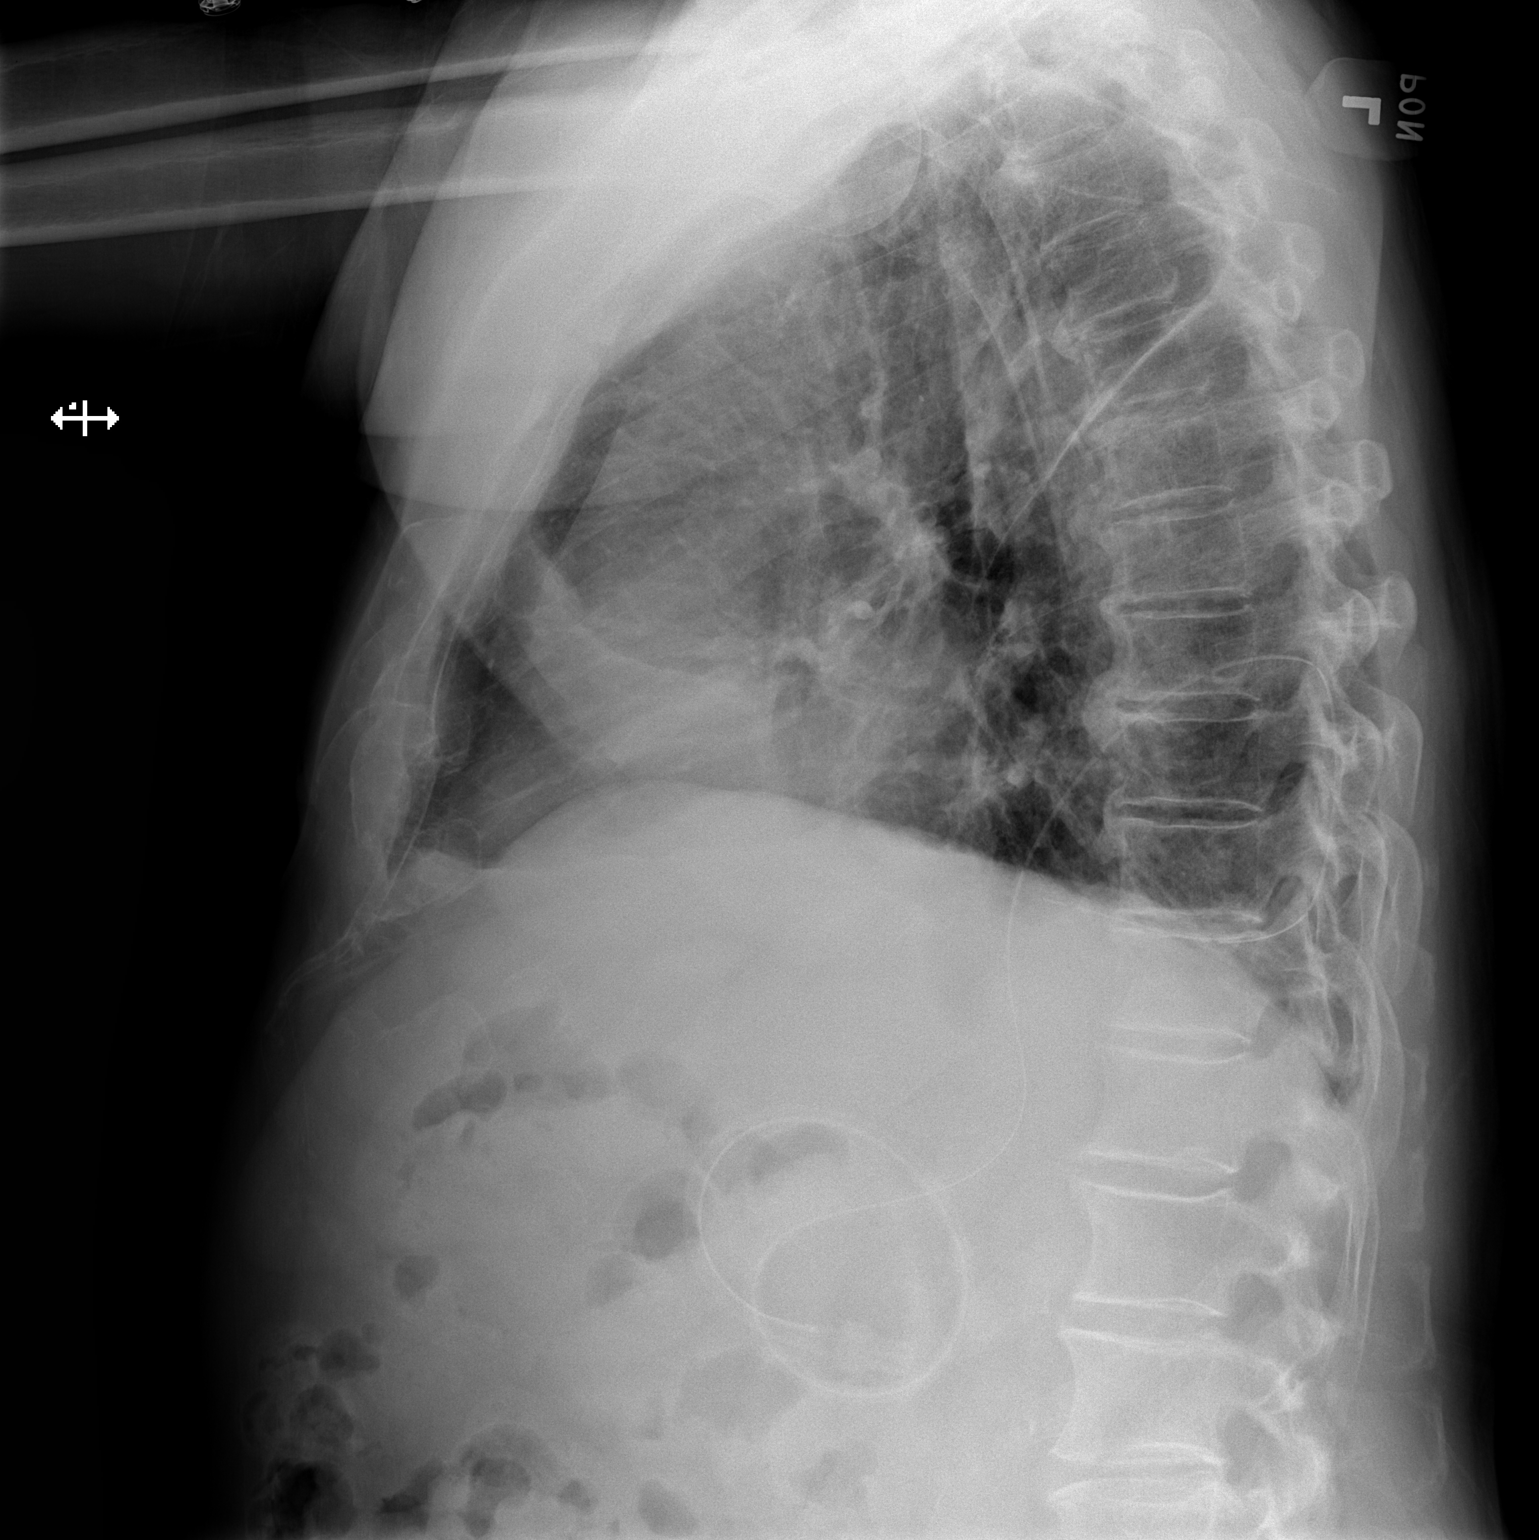

[2 of 2 positions shown; findings below may reference images not displayed]

FINDINGS: Similar mild volume loss left hemithorax. The diffuse pleural
thickening in the left chest with nodular component at the apex has
decreased in the interval. Left pleural drain remains in place
posteriorly. Right lung clear. Cardiopericardial silhouette is at
upper limits of normal for size. The visualized bony structures of
the thorax show no acute abnormality.
IMPRESSION: 1. Interval decrease in pleural thickening in the left chest with
persistent left pleural drain.
2. No acute cardiopulmonary findings.

## 2021-06-13 ENCOUNTER — Other Ambulatory Visit: Payer: Self-pay

## 2021-06-13 ENCOUNTER — Emergency Department (HOSPITAL_BASED_OUTPATIENT_CLINIC_OR_DEPARTMENT_OTHER): Payer: PPO

## 2021-06-13 ENCOUNTER — Encounter (HOSPITAL_BASED_OUTPATIENT_CLINIC_OR_DEPARTMENT_OTHER): Payer: Self-pay | Admitting: Obstetrics and Gynecology

## 2021-06-13 ENCOUNTER — Emergency Department (HOSPITAL_BASED_OUTPATIENT_CLINIC_OR_DEPARTMENT_OTHER)
Admission: EM | Admit: 2021-06-13 | Discharge: 2021-06-14 | Disposition: A | Discharge status: Home / Self Care | Payer: PPO | Attending: Emergency Medicine | Admitting: Emergency Medicine

## 2021-06-13 DIAGNOSIS — R9389 Abnormal findings on diagnostic imaging of other specified body structures: Secondary | ICD-10-CM | POA: Insufficient documentation

## 2021-06-13 DIAGNOSIS — R109 Unspecified abdominal pain: Secondary | ICD-10-CM

## 2021-06-13 DIAGNOSIS — C787 Secondary malignant neoplasm of liver and intrahepatic bile duct: Secondary | ICD-10-CM | POA: Diagnosis not present

## 2021-06-13 DIAGNOSIS — R1012 Left upper quadrant pain: Secondary | ICD-10-CM | POA: Insufficient documentation

## 2021-06-13 DIAGNOSIS — I10 Essential (primary) hypertension: Secondary | ICD-10-CM | POA: Diagnosis not present

## 2021-06-13 DIAGNOSIS — R079 Chest pain, unspecified: Secondary | ICD-10-CM

## 2021-06-13 DIAGNOSIS — Z79899 Other long term (current) drug therapy: Secondary | ICD-10-CM | POA: Diagnosis not present

## 2021-06-13 DIAGNOSIS — I959 Hypotension, unspecified: Secondary | ICD-10-CM | POA: Diagnosis not present

## 2021-06-13 DIAGNOSIS — R0602 Shortness of breath: Secondary | ICD-10-CM | POA: Insufficient documentation

## 2021-06-13 DIAGNOSIS — Z7982 Long term (current) use of aspirin: Secondary | ICD-10-CM | POA: Diagnosis not present

## 2021-06-13 DIAGNOSIS — D649 Anemia, unspecified: Secondary | ICD-10-CM | POA: Diagnosis not present

## 2021-06-13 DIAGNOSIS — E1165 Type 2 diabetes mellitus with hyperglycemia: Secondary | ICD-10-CM | POA: Insufficient documentation

## 2021-06-13 DIAGNOSIS — R55 Syncope and collapse: Secondary | ICD-10-CM | POA: Insufficient documentation

## 2021-06-13 DIAGNOSIS — R188 Other ascites: Secondary | ICD-10-CM | POA: Diagnosis not present

## 2021-06-13 DIAGNOSIS — R0789 Other chest pain: Secondary | ICD-10-CM | POA: Insufficient documentation

## 2021-06-13 DIAGNOSIS — C61 Malignant neoplasm of prostate: Secondary | ICD-10-CM | POA: Diagnosis not present

## 2021-06-13 DIAGNOSIS — Z7984 Long term (current) use of oral hypoglycemic drugs: Secondary | ICD-10-CM | POA: Diagnosis not present

## 2021-06-13 DIAGNOSIS — J90 Pleural effusion, not elsewhere classified: Secondary | ICD-10-CM | POA: Diagnosis not present

## 2021-06-13 LAB — COMPREHENSIVE METABOLIC PANEL
ALT: 11 U/L (ref 0–44)
AST: 10 U/L — ABNORMAL LOW (ref 15–41)
Albumin: 3.8 g/dL (ref 3.5–5.0)
Alkaline Phosphatase: 59 U/L (ref 38–126)
Anion gap: 11 (ref 5–15)
BUN: 24 mg/dL — ABNORMAL HIGH (ref 8–23)
CO2: 28 mmol/L (ref 22–32)
Calcium: 9.9 mg/dL (ref 8.9–10.3)
Chloride: 99 mmol/L (ref 98–111)
Creatinine, Ser: 1.03 mg/dL (ref 0.61–1.24)
GFR, Estimated: >60 mL/min (ref >60)
Glucose, Bld: 133 mg/dL — ABNORMAL HIGH (ref 70–99)
Potassium: 3.7 mmol/L (ref 3.5–5.1)
Sodium: 138 mmol/L (ref 135–145)
Total Bilirubin: 0.6 mg/dL (ref 0.3–1.2)
Total Protein: 6.7 g/dL (ref 6.5–8.1)

## 2021-06-13 LAB — CBC WITH DIFFERENTIAL/PLATELET
Abs Immature Granulocytes: 0.07 10*3/uL (ref 0.00–0.07)
Basophils Absolute: 0 10*3/uL (ref 0.0–0.1)
Basophils Relative: 0 %
Eosinophils Absolute: 0.1 10*3/uL (ref 0.0–0.5)
Eosinophils Relative: 1 %
HCT: 40.6 % (ref 39.0–52.0)
Hemoglobin: 12.5 g/dL — ABNORMAL LOW (ref 13.0–17.0)
Immature Granulocytes: 1 %
Lymphocytes Relative: 13 %
Lymphs Abs: 1.3 10*3/uL (ref 0.7–4.0)
MCH: 27.7 pg (ref 26.0–34.0)
MCHC: 30.8 g/dL (ref 30.0–36.0)
MCV: 90 fL (ref 80.0–100.0)
Monocytes Absolute: 1.4 10*3/uL — ABNORMAL HIGH (ref 0.1–1.0)
Monocytes Relative: 14 %
Neutro Abs: 7.3 10*3/uL (ref 1.7–7.7)
Neutrophils Relative %: 71 %
Platelets: 189 10*3/uL (ref 150–400)
RBC: 4.51 MIL/uL (ref 4.22–5.81)
RDW: 16 % — ABNORMAL HIGH (ref 11.5–15.5)
WBC: 10.2 10*3/uL (ref 4.0–10.5)
nRBC: 0 % (ref 0.0–0.2)

## 2021-06-13 LAB — URINALYSIS, ROUTINE W REFLEX MICROSCOPIC
Bilirubin Urine: NEGATIVE
Glucose, UA: >1000 mg/dL — AB
Hgb urine dipstick: NEGATIVE
Ketones, ur: NEGATIVE mg/dL
Leukocytes,Ua: NEGATIVE
Nitrite: NEGATIVE
Protein, ur: 30 mg/dL — AB
Specific Gravity, Urine: 1.039 — ABNORMAL HIGH (ref 1.005–1.030)
pH: 5.5 (ref 5.0–8.0)

## 2021-06-13 LAB — TROPONIN I (HIGH SENSITIVITY)
Troponin I (High Sensitivity): 6 ng/L (ref <18)
Troponin I (High Sensitivity): 5 ng/L (ref <18)

## 2021-06-13 LAB — LACTIC ACID, PLASMA
Lactic Acid, Venous: 1.8 mmol/L (ref 0.5–1.9)
Lactic Acid, Venous: 1.6 mmol/L (ref 0.5–1.9)

## 2021-06-13 LAB — PROTIME-INR
INR: 1 (ref 0.8–1.2)
Prothrombin Time: 12.9 seconds (ref 11.4–15.2)

## 2021-06-13 LAB — BRAIN NATRIURETIC PEPTIDE: B Natriuretic Peptide: 29.7 pg/mL (ref 0.0–100.0)

## 2021-06-13 MED ORDER — IOHEXOL 350 MG/ML SOLN
100.0000 mL | Freq: Once | INTRAVENOUS | Status: AC | PRN
  Administered 2021-06-13: 100 mL via INTRAVENOUS

## 2021-06-13 MED ORDER — MORPHINE SULFATE (PF) 4 MG/ML IV SOLN
4.0000 mg | Freq: Once | INTRAVENOUS | Status: AC
  Administered 2021-06-13: 4 mg via INTRAVENOUS
  Filled 2021-06-13: qty 1

## 2021-06-13 NOTE — ED Provider Notes (Signed)
?[facility] ?Provider Note ? ? ?CSN: [id number] ?Arrival date & time: 06/13/21  1726 ? ?  ? ?History ? ?Chief Complaint  ?Patient presents with  ? Hypotension  ? ? ?Jack Haynes is a 81 y.o. male with an unfortunate recent past medical history significant for mesothelioma of the left lung, previous need for left-sided VATS for pleural effusion, hypertension, diabetes, neuropathic pain who presents with concern for weakness, worsening pain, ongoing laryngitis for 10 days, hypotension at home.  Patient reports he has some chronic left-sided abdominal pain of unclear etiology, he reports that it is worsened significantly since last imaging 2 months ago.  He denies nausea, vomiting, diarrhea.  He does endorse some chills but no objective fever.  He reports some worsening shortness of breath that is new for him.  He reports some transient chest pain that feels like it travels from the right side of his chest across the left side of his chest.  He does not describe it as pressure, sharp, or squeezing.  He denies radiation to the neck or the arms. ? ?HPI ? ?  ? ?Home Medications ?Prior to Admission medications   ?Medication Sig Start Date End Date Taking? Authorizing Provider  ?acetaminophen (TYLENOL) 500 MG tablet Take 2 tablets (1,000 mg total) by mouth every 6 (six) hours as needed for mild pain or fever. ?Patient taking differently: Take 500 mg by mouth every 6 (six) hours as needed for mild pain or fever. 11/15/19   Barrett, Erin R, PA-C  ?aspirin EC 81 MG tablet Take 81 mg by mouth daily. Swallow whole.    [provider]  ?calcium carbonate (TUMS - DOSED IN MG ELEMENTAL CALCIUM) 500 MG chewable tablet Chew 1,000 mg by mouth daily as needed for indigestion or heartburn.    [provider]  ?Cetirizine HCl (ZYRTEC ALLERGY) 10 MG CAPS Zyrtec 10 mg capsule ? Take by oral route.    [provider]  ?ferrous sulfate 325 (65 FE) MG tablet Take 325 mg by mouth daily  with breakfast.    [provider]  ?fluticasone (FLONASE) 50 MCG/ACT nasal spray Place 1 spray into both nostrils daily. 06/06/21   Crain, Loree Fee L, PA  ?folic acid (FOLVITE) 1 MG tablet TAKE 1 TABLET BY MOUTH EVERY DAY 02/11/21   Heilingoetter, Cassandra L, PA-C  ?JARDIANCE 10 MG TABS tablet TAKE 1 TABLET BY MOUTH DAILY BEFORE BREAKFAST. 04/07/21   [name], MD  ?lidocaine-prilocaine (EMLA) cream Apply to Port-A-Cath site 30-60-minute before treatment. 09/04/20   Curt Bears, MD  ?losartan-hydrochlorothiazide [facility]) 100-12.5 MG tablet TAKE 1 TABLET BY MOUTH EVERY DAY 01/03/21   [name], MD  ?metFORMIN (GLUCOPHAGE) 500 MG tablet TAKE 1 TABLET BY MOUTH 2 TIMES DAILY WITH A MEAL. 03/11/21   [name], MD  ?ondansetron (ZOFRAN ODT) 4 MG disintegrating tablet Take 1 tablet (4 mg total) by mouth every 8 (eight) hours as needed for nausea or vomiting. Starting 3 days after chemotherapy 02/28/20   Heilingoetter, Cassandra L, PA-C  ?pantoprazole (PROTONIX) 40 MG tablet Take 1 tablet (40 mg total) by mouth daily. 06/06/21   Crain, Whitney L, PA  ?psyllium (METAMUCIL) 58.6 % powder Take 1 packet by mouth 3 (three) times daily.    [provider]  ?rosuvastatin (CRESTOR) 20 MG tablet TAKE 1 TABLET BY MOUTH EVERY DAY 08/01/20   O'Neal, Cassie Freer, MD  ?senna (SENOKOT) 8.6 MG tablet Take 1 tablet by mouth daily.    [provider]  ?simethicone (MYLICON) 497 MG chewable tablet Chew 125 mg by mouth daily as needed (gas).    [provider]  ?   ? ?Allergies    ?Patient has no known allergies.   ? ?Review of Systems   ?Review of Systems  ?Constitutional:  Positive for fatigue.  ?Respiratory:  Positive for shortness of breath.   ?Gastrointestinal:  Positive for abdominal pain.  ?Neurological:  Positive for weakness.  ?All other systems reviewed and are negative. ? ?Physical Exam ?Updated Vital Signs ?BP 123/85 (BP Location: Right Arm)   Pulse (!) 101    Temp 97.8 ?F (36.6 ?C) (Oral)   Resp 20   Ht 5\' 7"  (1.702 m)   Wt 81.6 kg   SpO2 96%   BMI 28.19 kg/m?  ?Physical Exam ?Vitals and nursing note reviewed.  ?Constitutional:   ?   General: He is not in acute distress. ?   Appearance: Normal appearance. He is ill-appearing.  ?HENT:  ?   Head: Normocephalic and atraumatic.  ?Eyes:  ?   General:     ?   Right eye: No discharge.     ?   Left eye: No discharge.  ?Cardiovascular:  ?   Rate and Rhythm: Normal rate and regular rhythm.  ?   Heart sounds: No murmur heard. ?  No friction rub. No gallop.  ?   Comments: Tenderness to palpation of the left chest wall ?Pulmonary:  ?   Effort: Pulmonary effort is normal.  ?   Breath sounds: Normal breath sounds.  ?   Comments: Decreased respiratory effort, some focal consolidation noted on left lower lung fields. No significant wheezing, stridor, rhonchi. ?Abdominal:  ?   General: Bowel sounds are normal.  ?   Comments: Firm distended abdomen with significant tenderness to palpation on the left upper and lower quadrants. No rebound, rigidity, guarding.  ?Skin: ?   General: Skin is warm and dry.  ?   Capillary Refill: Capillary refill takes less than 2 seconds.  ?   Coloration: Skin is pale.  ?Neurological:  ?   Mental Status: He is alert and oriented to person, place, and time.  ?Psychiatric:     ?   Mood and Affect: Mood normal.     ?   Behavior: Behavior normal.  ? ? ?ED Results / Procedures / Treatments   ?Labs ?(all labs ordered are listed, but only abnormal results are displayed) ?Labs Reviewed  ?COMPREHENSIVE METABOLIC PANEL - Abnormal; Notable for the following components:  ?    Result Value  ? Glucose, Bld 133 (*)   ? BUN 24 (*)   ? AST 10 (*)   ? All other components within normal limits  ?CBC WITH DIFFERENTIAL/PLATELET - Abnormal; Notable for the following components:  ? Hemoglobin 12.5 (*)   ? RDW 16.0 (*)   ? Monocytes Absolute 1.4 (*)   ? All other components within normal limits  ?URINALYSIS, ROUTINE W REFLEX  MICROSCOPIC - Abnormal; Notable for the following components:  ? Specific Gravity, Urine 1.039 (*)   ? Glucose, UA >1,000 (*)   ? Protein, ur 30 (*)   ? All other components within normal limits  ?LACTIC ACID, PLASMA  ?LACTIC ACID, PLASMA  ?PROTIME-INR  ?BRAIN NATRIURETIC PEPTIDE  ?TROPONIN I (HIGH SENSITIVITY)  ?TROPONIN I (HIGH SENSITIVITY)  ? ? ?EKG ?None ? ?Radiology ?CT Angio Chest PE W and/or Wo Contrast ? ?Result Date: 06/13/2021 ?CLINICAL DATA:  Hypotension, abdominal pain, malaise,  mesothelioma, prostate cancer. EXAM: CT ANGIOGRAPHY CHEST CT ABDOMEN AND PELVIS WITH CONTRAST TECHNIQUE: Multidetector CT imaging of the chest was performed using the standard protocol during bolus administration of intravenous contrast. Multiplanar CT image reconstructions and MIPs were obtained to evaluate the vascular anatomy. Multidetector CT imaging of the abdomen and pelvis was performed using the standard protocol during bolus administration of intravenous contrast. RADIATION DOSE REDUCTION: This exam was performed according to the departmental dose-optimization program which includes automated exposure control, adjustment of the mA and/or kV according to patient size and/or use of iterative reconstruction technique. CONTRAST:  140mL OMNIPAQUE IOHEXOL 350 MG/ML SOLN COMPARISON:  CT abdomen/pelvis dated 03/28/2021. CT chest dated 02/13/2021. FINDINGS: CTA CHEST FINDINGS Cardiovascular: Satisfactory opacification the bilateral pulmonary arteries to the segmental level. No evidence of pulmonary embolism. Although not tailored for evaluation of the thoracic aorta, there is no evidence of thoracic aortic aneurysm or dissection. The heart is normal in size. Progressive pericardial nodularity, measuring up to 5.4 cm along the left heart border (series 4/image 88), compatible with metastatic disease. Three vessel coronary atherosclerosis. Mediastinum/Nodes: Progressive mediastinal lymphadenopathy, including a 2.5 cm short axis  prevascular node (series 4/image 48) which is essentially new. 1.6 cm short axis node anterior to the left mainstem bronchus (series 4/image 60), new. Visualized thyroid is unremarkable. Lungs/Pleura: Pleural-based

## 2021-06-13 NOTE — ED Notes (Signed)
Patient transported to CT 

## 2021-06-13 NOTE — ED Triage Notes (Signed)
Patient reports to the ER for hypotension. Patient's wife reports she palpated it at 80/50. She also reports his pulse was 70 and weak and his CBG was 137. Patient currently has laryngitis.  ?

## 2021-06-14 NOTE — Discharge Instructions (Signed)
Your history, exam, and work-up today are concerning for more widespread metastasis of his cancer.  I suspect this is contributing to some of the symptoms of the discomfort in the torso.  After 6 hours of observation, he did not have any further episodes of hypotension and altered mental status although I do suspect there is a degree of mild dehydration.  We had a shared decision-making conversation including discussing admission however given his stability and well appearance now, we agree with discharge home with close follow-up and strict return precautions.  If any symptoms change or worsen acutely, please return to the nearest emergency department. ?

## 2021-06-14 NOTE — ED Notes (Signed)
Pt agreeable with d/c plan as discussed by provider- this nurse has verbally reinforced d/c instructions and provided pt with written copy- pt acknowledges verbal understanding and denies any additional questions, concerns, needs-- pt escorted to car by w/c; spouse is driver.    ?

## 2021-06-15 ENCOUNTER — Ambulatory Visit (INDEPENDENT_AMBULATORY_CARE_PROVIDER_SITE_OTHER): Payer: PPO | Admitting: Emergency Medicine

## 2021-06-15 ENCOUNTER — Encounter: Payer: Self-pay | Admitting: Emergency Medicine

## 2021-06-15 ENCOUNTER — Telehealth: Payer: Self-pay | Admitting: Medical Oncology

## 2021-06-15 ENCOUNTER — Telehealth: Payer: Self-pay | Admitting: Internal Medicine

## 2021-06-15 VITALS — BP 128/76 | HR 96 | Temp 97.7°F | Ht 67.0 in | Wt 187.4 lb

## 2021-06-15 DIAGNOSIS — E1165 Type 2 diabetes mellitus with hyperglycemia: Secondary | ICD-10-CM

## 2021-06-15 DIAGNOSIS — E1159 Type 2 diabetes mellitus with other circulatory complications: Secondary | ICD-10-CM | POA: Diagnosis not present

## 2021-06-15 DIAGNOSIS — I152 Hypertension secondary to endocrine disorders: Secondary | ICD-10-CM | POA: Diagnosis not present

## 2021-06-15 DIAGNOSIS — C771 Secondary and unspecified malignant neoplasm of intrathoracic lymph nodes: Secondary | ICD-10-CM

## 2021-06-15 DIAGNOSIS — C450 Mesothelioma of pleura: Secondary | ICD-10-CM | POA: Diagnosis not present

## 2021-06-15 DIAGNOSIS — J040 Acute laryngitis: Secondary | ICD-10-CM

## 2021-06-15 LAB — POCT GLYCOSYLATED HEMOGLOBIN (HGB A1C): Hemoglobin A1C: 6.8 % — AB (ref 4.0–5.6)

## 2021-06-15 MED ORDER — TRAMADOL HCL 50 MG PO TABS: 50.0000 mg | ORAL_TABLET | Freq: Three times a day (TID) | ORAL | 1 refills | Status: DC | PRN

## 2021-06-15 NOTE — Progress Notes (Signed)
Jack Haynes ?81 y.o. ? ? ?Chief Complaint  ?Patient presents with  ? poss laryngitis  ?  Was seen UC on 4/22. Dx with laryngitis. Not getting better.   ? ? ?HISTORY OF PRESENT ILLNESS: ?This is a 81 y.o. male seen at urgent care center on 06/07/2021 and diagnosed with acute laryngitis. ?Seen in the emergency department on 06/13/2021 when he presented with a near syncopal episode. ?Patient has history of mesothelioma.  CT scans of chest, abdomen and pelvis showed metastatic disease with new findings of progressive disease. ?Here for follow-up today. ? ?HPI ? ? ?Prior to Admission medications   ?Medication Sig Start Date End Date Taking? Authorizing Provider  ?acetaminophen (TYLENOL) 500 MG tablet Take 2 tablets (1,000 mg total) by mouth every 6 (six) hours as needed for mild pain or fever. ?Patient taking differently: Take 500 mg by mouth every 6 (six) hours as needed for mild pain or fever. 11/15/19  Yes Barrett, Erin R, PA-C  ?aspirin EC 81 MG tablet Take 81 mg by mouth daily. Swallow whole.   Yes [provider]  ?calcium carbonate (TUMS - DOSED IN MG ELEMENTAL CALCIUM) 500 MG chewable tablet Chew 1,000 mg by mouth daily as needed for indigestion or heartburn.   Yes [provider]  ?Cetirizine HCl (ZYRTEC ALLERGY) 10 MG CAPS Zyrtec 10 mg capsule ? Take by oral route.   Yes [provider]  ?ferrous sulfate 325 (65 FE) MG tablet Take 325 mg by mouth daily with breakfast.   Yes [provider]  ?fluticasone (FLONASE) 50 MCG/ACT nasal spray Place 1 spray into both nostrils daily. 06/06/21  Yes Crain, Whitney L, PA  ?folic acid (FOLVITE) 1 MG tablet TAKE 1 TABLET BY MOUTH EVERY DAY 02/11/21  Yes Heilingoetter, Cassandra L, PA-C  ?JARDIANCE 10 MG TABS tablet TAKE 1 TABLET BY MOUTH DAILY BEFORE BREAKFAST. 04/07/21  Yes Andrian Urbach, Ines Bloomer, MD  ?lidocaine-prilocaine (EMLA) cream Apply to Port-A-Cath site 30-60-minute before treatment. 09/04/20  Yes Curt Bears, MD   ?losartan-hydrochlorothiazide [facility]) 100-12.5 MG tablet TAKE 1 TABLET BY MOUTH EVERY DAY 01/03/21  Yes Maliyah Willets, Ines Bloomer, MD  ?metFORMIN (GLUCOPHAGE) 500 MG tablet TAKE 1 TABLET BY MOUTH 2 TIMES DAILY WITH A MEAL. 03/11/21  Yes Emilygrace Grothe, Ines Bloomer, MD  ?ondansetron (ZOFRAN ODT) 4 MG disintegrating tablet Take 1 tablet (4 mg total) by mouth every 8 (eight) hours as needed for nausea or vomiting. Starting 3 days after chemotherapy 02/28/20  Yes Heilingoetter, Cassandra L, PA-C  ?pantoprazole (PROTONIX) 40 MG tablet Take 1 tablet (40 mg total) by mouth daily. 06/06/21  Yes Crain, Whitney L, PA  ?psyllium (METAMUCIL) 58.6 % powder Take 1 packet by mouth 3 (three) times daily.   Yes [provider]  ?rosuvastatin (CRESTOR) 20 MG tablet TAKE 1 TABLET BY MOUTH EVERY DAY 08/01/20  Yes O'Neal, Cassie Freer, MD  ?senna (SENOKOT) 8.6 MG tablet Take 1 tablet by mouth daily.   Yes [provider]  ?simethicone (MYLICON) 098 MG chewable tablet Chew 125 mg by mouth daily as needed (gas).   Yes [provider]  ?traMADol (ULTRAM) 50 MG tablet Take 1 tablet (50 mg total) by mouth every 8 (eight) hours as needed for up to 20 days. 06/15/21 07/05/21 Yes SagardiaInes Bloomer, MD  ? ? ?No Known Allergies ? ?Patient Active Problem List  ? Diagnosis Date Noted  ? Neuropathic pain 04/07/2021  ? Diverticulitis of colon with perforation 03/17/2021  ? DISH (diffuse idiopathic skeletal hyperostosis) 03/12/2021  ?  Sinusitis 02/19/2021  ? Type 2 diabetes mellitus with hyperglycemia, without long-term current use of insulin ([place]) 06/11/2020  ? Hypertension associated with diabetes ([place]) 02/28/2020  ? Port-A-Cath in place 01/09/2020  ? Mesothelioma of left lung ([place]) 11/23/2019  ? Encounter for antineoplastic chemotherapy 11/23/2019  ? S/P Left VATS drainage of pleural effusion, pleural biopsy, placement of Pleur-x catheter 11/15/2019  ? Atherosclerotic cardiovascular disease 10/29/2019  ? Pleural effusion 10/29/2019   ? History of prostate cancer 10/29/2019  ? Essential hypertension 10/09/2019  ? Abnormal EKG 01/02/2015  ? Prostate cancer ([place]) 11/01/2011  ? ? ?Past Medical History:  ?Diagnosis Date  ? Arthritis   ? Cancer [facility])   ? hx of prostatae cancer  ? Diverticulitis   ? Dyspnea   ? until chest tube placed  ? Hypertension   ? Mesothelioma [facility])   ? ? ?Past Surgical History:  ?Procedure Laterality Date  ? APPENDECTOMY    ? [place]  ? disectomy  ? CHEST TUBE INSERTION Left 11/13/2019  ? Procedure: INSERTION PLEURAL DRAINAGE CATHETER;  Surgeon: Grace Isaac, MD;  Location: [place];  Service: Thoracic;  Laterality: Left;  ? [place]  ? injury  ? IR PORT REPAIR CENTRAL VENOUS ACCESS DEVICE  05/27/2020  ? JOINT REPLACEMENT Left 2010  ? left knee replacement  ? KNEE SURGERY    ? PLEURAL BIOPSY Left 11/13/2019  ? Procedure: PLEURAL BIOPSY;  Surgeon: Grace Isaac, MD;  Location: [place];  Service: Thoracic;  Laterality: Left;  ? PORTACATH PLACEMENT Left 11/26/2019  ? Procedure: INSERTION PORT-A-CATH BARD POWERPORT 9.6 FR CATHETER;  Surgeon: Grace Isaac, MD;  Location: [place];  Service: Thoracic;  Laterality: Left;  ? PROSTATE SURGERY    ? REMOVAL OF PLEURAL DRAINAGE CATHETER Left 11/26/2019  ? Procedure: REMOVAL OF PLEURAL DRAINAGE CATHETER;  Surgeon: Grace Isaac, MD;  Location: [place];  Service: Thoracic;  Laterality: Left;  ? SPINE SURGERY    ? TALC PLEURODESIS Left 11/13/2019  ? Procedure: possible TALC PLEURADESIS;  Surgeon: Grace Isaac, MD;  Location: [place];  Service: Thoracic;  Laterality: Left;  ? TOTAL KNEE ARTHROPLASTY  02/01/2012  ? Procedure: TOTAL KNEE ARTHROPLASTY;  Surgeon: Sydnee Cabal, MD;  Location: [facility];  Service: Orthopedics;  Laterality: Left;  ? VIDEO ASSISTED THORACOSCOPY Left 11/13/2019  ? Procedure: VIDEO ASSISTED THORACOSCOPY;  Surgeon: Grace Isaac, MD;  Location: [place];  Service: Thoracic;  Laterality: Left;  ? VIDEO BRONCHOSCOPY N/A 11/13/2019  ?  Procedure: VIDEO BRONCHOSCOPY;  Surgeon: Grace Isaac, MD;  Location: [place];  Service: Thoracic;  Laterality: N/A;  ? ? ?Social History  ? ?Socioeconomic History  ? Marital status: Divorced  ?  Spouse name: Not on file  ? Number of children: 2  ? Years of education: 49  ? Highest education level: Not on file  ?Occupational History  ? Occupation: retired  ?  Comment: [employer]   ?Tobacco Use  ? Smoking status: Former  ?  Years: 15.00  ?  Types: Cigarettes  ?  Quit date: 02/15/1981  ?  Years since quitting: 40.3  ? Smokeless tobacco: Former  ?  Types: Chew  ?  Quit date: 12/2018  ?Vaping Use  ? Vaping Use: Never used  ?Substance and Sexual Activity  ? Alcohol use: Yes  ?  Alcohol/week: 3.0 standard drinks  ?  Types: 3 Shots of liquor per week  ? Drug use: No  ?  Sexual activity: Yes  ?Other Topics Concern  ? Not on file  ?Social History Narrative  ? Not on file  ? ?Social Determinants of Health  ? ?Financial Resource Strain: Not on file  ?Food Insecurity: Not on file  ?Transportation Needs: Not on file  ?Physical Activity: Not on file  ?Stress: Not on file  ?Social Connections: Not on file  ?Intimate Partner Violence: Not on file  ? ? ?Family History  ?Problem Relation Age of Onset  ? Cancer Sister   ?     brain tumor  ? Heart disease Brother   ? Alzheimer's disease Brother   ? Heart disease Sister   ? Alzheimer's disease Father   ? Hypertension Sister   ? Colon cancer Neg Hx   ? Colon polyps Neg Hx   ? Esophageal cancer Neg Hx   ? Rectal cancer Neg Hx   ? Stomach cancer Neg Hx   ? ? ? ?Review of Systems  ?Constitutional: Negative.  Negative for chills and fever.  ?HENT: Negative.  Negative for congestion and sore throat.   ?Respiratory:  Negative for cough, hemoptysis and shortness of breath.   ?Cardiovascular:  Negative for chest pain and palpitations.  ?Gastrointestinal:  Negative for abdominal pain, nausea and vomiting.  ?Genitourinary: Negative.   ?Skin: Negative.   ?Neurological: Negative.   Negative for dizziness and headaches.  ?All other systems reviewed and are negative. ? ?Today's Vitals  ? 06/15/21 1009  ?BP: 128/76  ?Pulse: 96  ?Temp: 97.7 ?F (36.5 ?C)  ?TempSrc: Oral  ?SpO2: 92%  ?Weight: 187 lb 6 oz

## 2021-06-15 NOTE — Assessment & Plan Note (Signed)
Most likely a result of metastatic disease. ?

## 2021-06-15 NOTE — Assessment & Plan Note (Addendum)
Metastatic disease affecting mediastinal nodes which are probably contributing to present laryngitis condition. ?Scheduled to follow-up with oncologist this week. ?

## 2021-06-15 NOTE — Assessment & Plan Note (Signed)
Well-controlled diabetes with hemoglobin A1c of 6.8.  Continue metformin 500 mg twice a day and Jardiance 10 mg daily. ?Well-controlled hypertension.  Continue Hyzaar 100-12.5 mg daily. ?BP Readings from Last 3 Encounters:  ?06/15/21 128/76  ?06/13/21 123/85  ?06/06/21 (!) 144/84  ? ? ?

## 2021-06-15 NOTE — Patient Instructions (Signed)
Health Maintenance After Age 81 After age 81, you are at a higher risk for certain long-term diseases and infections as well as injuries from falls. Falls are a major cause of broken bones and head injuries in people who are older than age 81. Getting regular preventive care can help to keep you healthy and well. Preventive care includes getting regular testing and making lifestyle changes as recommended by your health care provider. Talk with your health care provider about: Which screenings and tests you should have. A screening is a test that checks for a disease when you have no symptoms. A diet and exercise plan that is right for you. What should I know about screenings and tests to prevent falls? Screening and testing are the best ways to find a health problem early. Early diagnosis and treatment give you the best chance of managing medical conditions that are common after age 81. Certain conditions and lifestyle choices may make you more likely to have a fall. Your health care provider may recommend: Regular vision checks. Poor vision and conditions such as cataracts can make you more likely to have a fall. If you wear glasses, make sure to get your prescription updated if your vision changes. Medicine review. Work with your health care provider to regularly review all of the medicines you are taking, including over-the-counter medicines. Ask your health care provider about any side effects that may make you more likely to have a fall. Tell your health care provider if any medicines that you take make you feel dizzy or sleepy. Strength and balance checks. Your health care provider may recommend certain tests to check your strength and balance while standing, walking, or changing positions. Foot health exam. Foot pain and numbness, as well as not wearing proper footwear, can make you more likely to have a fall. Screenings, including: Osteoporosis screening. Osteoporosis is a condition that causes  the bones to get weaker and break more easily. Blood pressure screening. Blood pressure changes and medicines to control blood pressure can make you feel dizzy. Depression screening. You may be more likely to have a fall if you have a fear of falling, feel depressed, or feel unable to do activities that you used to do. Alcohol use screening. Using too much alcohol can affect your balance and may make you more likely to have a fall. Follow these instructions at home: Lifestyle Do not drink alcohol if: Your health care provider tells you not to drink. If you drink alcohol: Limit how much you have to: 0-1 drink a day for women. 0-2 drinks a day for men. Know how much alcohol is in your drink. In the [place] one drink equals one 12 oz bottle of beer (355 mL), one 5 oz glass of wine (148 mL), or one 1 oz glass of hard liquor (44 mL). Do not use any products that contain nicotine or tobacco. These products include cigarettes, chewing tobacco, and vaping devices, such as e-cigarettes. If you need help quitting, ask your health care provider. Activity  Follow a regular exercise program to stay fit. This will help you maintain your balance. Ask your health care provider what types of exercise are appropriate for you. If you need a cane or walker, use it as recommended by your health care provider. Wear supportive shoes that have nonskid soles. Safety  Remove any tripping hazards, such as rugs, cords, and clutter. Install safety equipment such as grab bars in bathrooms and safety rails on stairs. Keep rooms and walkways   well-lit. General instructions Talk with your health care provider about your risks for falling. Tell your health care provider if: You fall. Be sure to tell your health care provider about all falls, even ones that seem minor. You feel dizzy, tiredness (fatigue), or off-balance. Take over-the-counter and prescription medicines only as told by your health care provider. These include  supplements. Eat a healthy diet and maintain a healthy weight. A healthy diet includes low-fat dairy products, low-fat (lean) meats, and fiber from whole grains, beans, and lots of fruits and vegetables. Stay current with your vaccines. Schedule regular health, dental, and eye exams. Summary Having a healthy lifestyle and getting preventive care can help to protect your health and wellness after age 81. Screening and testing are the best way to find a health problem early and help you avoid having a fall. Early diagnosis and treatment give you the best chance for managing medical conditions that are more common for people who are older than age 81. Falls are a major cause of broken bones and head injuries in people who are older than age 81. Take precautions to prevent a fall at home. Work with your health care provider to learn what changes you can make to improve your health and wellness and to prevent falls. This information is not intended to replace advice given to you by your health care provider. Make sure you discuss any questions you have with your health care provider. Document Revised: 06/23/2020 Document Reviewed: 06/23/2020 Elsevier Patient Education  2023 Elsevier Inc.  

## 2021-06-15 NOTE — Telephone Encounter (Signed)
Progression of disease-Jack Haynes reports pt was in the hospital and had scans and they were told his cancer is progressing . ?He has appt with PCP today  at 1000. ?Scans cancelled for this month. ? ?Next appt around 18th . ? ?Do you want to see him sooner? ?

## 2021-06-15 NOTE — Telephone Encounter (Signed)
.  Called patient to schedule appointment per 5/1 inbasket, patient is aware of date and time.   ?

## 2021-06-23 ENCOUNTER — Other Ambulatory Visit: Payer: Self-pay

## 2021-06-23 ENCOUNTER — Inpatient Hospital Stay: Payer: PPO | Attending: Internal Medicine

## 2021-06-23 ENCOUNTER — Inpatient Hospital Stay (HOSPITAL_BASED_OUTPATIENT_CLINIC_OR_DEPARTMENT_OTHER): Payer: PPO | Admitting: Internal Medicine

## 2021-06-23 ENCOUNTER — Inpatient Hospital Stay: Payer: PPO

## 2021-06-23 VITALS — BP 129/87 | HR 113 | Temp 98.2°F | Resp 17 | Wt 186.7 lb

## 2021-06-23 DIAGNOSIS — C450 Mesothelioma of pleura: Secondary | ICD-10-CM | POA: Diagnosis not present

## 2021-06-23 DIAGNOSIS — R5383 Other fatigue: Secondary | ICD-10-CM | POA: Insufficient documentation

## 2021-06-23 DIAGNOSIS — C771 Secondary and unspecified malignant neoplasm of intrathoracic lymph nodes: Secondary | ICD-10-CM | POA: Diagnosis not present

## 2021-06-23 DIAGNOSIS — I2510 Atherosclerotic heart disease of native coronary artery without angina pectoris: Secondary | ICD-10-CM | POA: Diagnosis not present

## 2021-06-23 DIAGNOSIS — R0602 Shortness of breath: Secondary | ICD-10-CM | POA: Insufficient documentation

## 2021-06-23 DIAGNOSIS — I700 Atherosclerosis of aorta: Secondary | ICD-10-CM | POA: Diagnosis not present

## 2021-06-23 DIAGNOSIS — C787 Secondary malignant neoplasm of liver and intrahepatic bile duct: Secondary | ICD-10-CM | POA: Diagnosis not present

## 2021-06-23 DIAGNOSIS — I10 Essential (primary) hypertension: Secondary | ICD-10-CM | POA: Diagnosis not present

## 2021-06-23 DIAGNOSIS — Z79899 Other long term (current) drug therapy: Secondary | ICD-10-CM | POA: Diagnosis not present

## 2021-06-23 DIAGNOSIS — Z5111 Encounter for antineoplastic chemotherapy: Secondary | ICD-10-CM | POA: Insufficient documentation

## 2021-06-23 DIAGNOSIS — M129 Arthropathy, unspecified: Secondary | ICD-10-CM | POA: Insufficient documentation

## 2021-06-23 DIAGNOSIS — C457 Mesothelioma of other sites: Secondary | ICD-10-CM | POA: Insufficient documentation

## 2021-06-23 DIAGNOSIS — R531 Weakness: Secondary | ICD-10-CM | POA: Diagnosis not present

## 2021-06-23 DIAGNOSIS — E538 Deficiency of other specified B group vitamins: Secondary | ICD-10-CM | POA: Diagnosis not present

## 2021-06-23 DIAGNOSIS — Z95828 Presence of other vascular implants and grafts: Secondary | ICD-10-CM

## 2021-06-23 LAB — CBC WITH DIFFERENTIAL (CANCER CENTER ONLY)
Abs Immature Granulocytes: 0.24 10*3/uL — ABNORMAL HIGH (ref 0.00–0.07)
Basophils Absolute: 0 10*3/uL (ref 0.0–0.1)
Basophils Relative: 0 %
Eosinophils Absolute: 0.1 10*3/uL (ref 0.0–0.5)
Eosinophils Relative: 1 %
HCT: 36.9 % — ABNORMAL LOW (ref 39.0–52.0)
Hemoglobin: 11.8 g/dL — ABNORMAL LOW (ref 13.0–17.0)
Immature Granulocytes: 3 %
Lymphocytes Relative: 13 %
Lymphs Abs: 1.3 10*3/uL (ref 0.7–4.0)
MCH: 28.4 pg (ref 26.0–34.0)
MCHC: 32 g/dL (ref 30.0–36.0)
MCV: 88.7 fL (ref 80.0–100.0)
Monocytes Absolute: 1.2 10*3/uL — ABNORMAL HIGH (ref 0.1–1.0)
Monocytes Relative: 13 %
Neutro Abs: 6.7 10*3/uL (ref 1.7–7.7)
Neutrophils Relative %: 70 %
Platelet Count: 206 10*3/uL (ref 150–400)
RBC: 4.16 MIL/uL — ABNORMAL LOW (ref 4.22–5.81)
RDW: 15.9 % — ABNORMAL HIGH (ref 11.5–15.5)
WBC Count: 9.5 10*3/uL (ref 4.0–10.5)
nRBC: 0 % (ref 0.0–0.2)

## 2021-06-23 LAB — CMP (CANCER CENTER ONLY)
ALT: 16 U/L (ref 0–44)
AST: 16 U/L (ref 15–41)
Albumin: 3 g/dL — ABNORMAL LOW (ref 3.5–5.0)
Alkaline Phosphatase: 56 U/L (ref 38–126)
Anion gap: 11 (ref 5–15)
BUN: 18 mg/dL (ref 8–23)
CO2: 27 mmol/L (ref 22–32)
Calcium: 8.9 mg/dL (ref 8.9–10.3)
Chloride: 97 mmol/L — ABNORMAL LOW (ref 98–111)
Creatinine: 0.93 mg/dL (ref 0.61–1.24)
GFR, Estimated: >60 mL/min (ref >60)
Glucose, Bld: 162 mg/dL — ABNORMAL HIGH (ref 70–99)
Potassium: 3.9 mmol/L (ref 3.5–5.1)
Sodium: 135 mmol/L (ref 135–145)
Total Bilirubin: 0.5 mg/dL (ref 0.3–1.2)
Total Protein: 6.6 g/dL (ref 6.5–8.1)

## 2021-06-23 MED ORDER — PROCHLORPERAZINE MALEATE 10 MG PO TABS: 10.0000 mg | ORAL_TABLET | Freq: Four times a day (QID) | ORAL | 0 refills | Status: AC | PRN

## 2021-06-23 MED ORDER — DEXAMETHASONE 4 MG PO TABS: ORAL_TABLET | ORAL | 1 refills | Status: AC

## 2021-06-23 MED ORDER — FOLIC ACID 1 MG PO TABS: 1.0000 mg | ORAL_TABLET | Freq: Every day | ORAL | 4 refills | Status: DC

## 2021-06-23 MED ORDER — CYANOCOBALAMIN 1000 MCG/ML IJ SOLN
1000.0000 ug | Freq: Once | INTRAMUSCULAR | Status: AC
  Administered 2021-06-23: 1000 ug via INTRAMUSCULAR
  Filled 2021-06-23: qty 1

## 2021-06-23 MED ORDER — CYANOCOBALAMIN 1000 MCG/ML IJ SOLN: 1000.0000 ug | Freq: Once | INTRAMUSCULAR | Status: DC

## 2021-06-23 MED ORDER — TRAMADOL HCL 50 MG PO TABS: 50.0000 mg | ORAL_TABLET | Freq: Two times a day (BID) | ORAL | 0 refills | Status: DC | PRN

## 2021-06-23 MED ORDER — SODIUM CHLORIDE 0.9% FLUSH
10.0000 mL | Freq: Once | INTRAVENOUS | Status: AC
  Administered 2021-06-23: 10 mL

## 2021-06-23 MED ORDER — HEPARIN SOD (PORK) LOCK FLUSH 100 UNIT/ML IV SOLN: 250.0000 [IU] | Freq: Once | INTRAVENOUS | Status: DC

## 2021-06-23 NOTE — Patient Instructions (Signed)
Pemetrexed injection ?What is this medication? ?PEMETREXED (PEM e TREX ed) is a chemotherapy drug used to treat lung cancers like non-small cell lung cancer and mesothelioma. It may also be used to treat other cancers. ?This medicine may be used for other purposes; ask your health care provider or pharmacist if you have questions. ?COMMON BRAND NAME(S): Alimta, PEMFEXY ?What should I tell my care team before I take this medication? ?They need to know if you have any of these conditions: ?infection (especially a virus infection such as chickenpox, cold sores, or herpes) ?kidney disease ?low blood counts, like low white cell, platelet, or red cell counts ?lung or breathing disease, like asthma ?radiation therapy ?an unusual or allergic reaction to pemetrexed, other medicines, foods, dyes, or preservative ?pregnant or trying to get pregnant ?breast-feeding ?How should I use this medication? ?This drug is given as an infusion into a vein. It is administered in a hospital or clinic by a specially trained health care professional. ?Talk to your pediatrician regarding the use of this medicine in children. Special care may be needed. ?Overdosage: If you think you have taken too much of this medicine contact a poison control center or emergency room at once. ?NOTE: This medicine is only for you. Do not share this medicine with others. ?What if I miss a dose? ?It is important not to miss your dose. Call your doctor or health care professional if you are unable to keep an appointment. ?What may interact with this medication? ?This medicine may interact with the following medications: ?Ibuprofen ?This list may not describe all possible interactions. Give your health care provider a list of all the medicines, herbs, non-prescription drugs, or dietary supplements you use. Also tell them if you smoke, drink alcohol, or use illegal drugs. Some items may interact with your medicine. ?What should I watch for while using this  medication? ?Visit your doctor for checks on your progress. This drug may make you feel generally unwell. This is not uncommon, as chemotherapy can affect healthy cells as well as cancer cells. Report any side effects. Continue your course of treatment even though you feel ill unless your doctor tells you to stop. ?In some cases, you may be given additional medicines to help with side effects. Follow all directions for their use. ?Call your doctor or health care professional for advice if you get a fever, chills or sore throat, or other symptoms of a cold or flu. Do not treat yourself. This drug decreases your body's ability to fight infections. Try to avoid being around people who are sick. ?This medicine may increase your risk to bruise or bleed. Call your doctor or health care professional if you notice any unusual bleeding. ?Be careful brushing and flossing your teeth or using a toothpick because you may get an infection or bleed more easily. If you have any dental work done, tell your dentist you are receiving this medicine. ?Avoid taking products that contain aspirin, acetaminophen, ibuprofen, naproxen, or ketoprofen unless instructed by your doctor. These medicines may hide a fever. ?Call your doctor or health care professional if you get diarrhea or mouth sores. Do not treat yourself. ?To protect your kidneys, drink water or other fluids as directed while you are taking this medicine. ?Do not become pregnant while taking this medicine or for 6 months after stopping it. Women should inform their doctor if they wish to become pregnant or think they might be pregnant. Men should not father a child while taking this medicine and  for 3 months after stopping it. This may interfere with the ability to father a child. You should talk to your doctor or health care professional if you are concerned about your fertility. There is a potential for serious side effects to an unborn child. Talk to your health care  professional or pharmacist for more information. Do not breast-feed an infant while taking this medicine or for 1 week after stopping it. ?What side effects may I notice from receiving this medication? ?Side effects that you should report to your doctor or health care professional as soon as possible: ?allergic reactions like skin rash, itching or hives, swelling of the face, lips, or tongue ?breathing problems ?redness, blistering, peeling or loosening of the skin, including inside the mouth ?signs and symptoms of bleeding such as bloody or black, tarry stools; red or dark-brown urine; spitting up blood or brown material that looks like coffee grounds; red spots on the skin; unusual bruising or bleeding from the eye, gums, or nose ?signs and symptoms of infection like fever or chills; cough; sore throat; pain or trouble passing urine ?signs and symptoms of kidney injury like trouble passing urine or change in the amount of urine ?signs and symptoms of liver injury like dark yellow or brown urine; general ill feeling or flu-like symptoms; light-colored stools; loss of appetite; nausea; right upper belly pain; unusually weak or tired; yellowing of the eyes or skin ?Side effects that usually do not require medical attention (report to your doctor or health care professional if they continue or are bothersome): ?constipation ?mouth sores ?nausea, vomiting ?unusually weak or tired ?This list may not describe all possible side effects. Call your doctor for medical advice about side effects. You may report side effects to FDA at 1-800-FDA-1088. ?Where should I keep my medication? ?This drug is given in a hospital or clinic and will not be stored at home. ?NOTE: This sheet is a summary. It may not cover all possible information. If you have questions about this medicine, talk to your doctor, pharmacist, or health care provider. ?? 2023 Elsevier/Gold Standard (2017-03-29 00:00:00) ?Carboplatin injection ?What is this  medication? ?CARBOPLATIN (KAR boe pla tin) is a chemotherapy drug. It targets fast dividing cells, like cancer cells, and causes these cells to die. This medicine is used to treat ovarian cancer and many other cancers. ?This medicine may be used for other purposes; ask your health care provider or pharmacist if you have questions. ?COMMON BRAND NAME(S): Paraplatin ?What should I tell my care team before I take this medication? ?They need to know if you have any of these conditions: ?blood disorders ?hearing problems ?kidney disease ?recent or ongoing radiation therapy ?an unusual or allergic reaction to carboplatin, cisplatin, other chemotherapy, other medicines, foods, dyes, or preservatives ?pregnant or trying to get pregnant ?breast-feeding ?How should I use this medication? ?This drug is usually given as an infusion into a vein. It is administered in a hospital or clinic by a specially trained health care professional. ?Talk to your pediatrician regarding the use of this medicine in children. Special care may be needed. ?Overdosage: If you think you have taken too much of this medicine contact a poison control center or emergency room at once. ?NOTE: This medicine is only for you. Do not share this medicine with others. ?What if I miss a dose? ?It is important not to miss a dose. Call your doctor or health care professional if you are unable to keep an appointment. ?What may interact with this  medication? ?medicines for seizures ?medicines to increase blood counts like filgrastim, pegfilgrastim, sargramostim ?some antibiotics like amikacin, gentamicin, neomycin, streptomycin, tobramycin ?vaccines ?Talk to your doctor or health care professional before taking any of these medicines: ?acetaminophen ?aspirin ?ibuprofen ?ketoprofen ?naproxen ?This list may not describe all possible interactions. Give your health care provider a list of all the medicines, herbs, non-prescription drugs, or dietary supplements you use.  Also tell them if you smoke, drink alcohol, or use illegal drugs. Some items may interact with your medicine. ?What should I watch for while using this medication? ?Your condition will be monitored carefully while you are

## 2021-06-23 NOTE — Progress Notes (Signed)
DISCONTINUE ON PATHWAY REGIMEN - Mesothelioma ? ? ?Bevacizumab 15 mg/kg IV + Carboplatin AUC=5 IV + Pemetrexed 500 mg/m2 IV q21 Days: ?  A cycle is every 21 days: ?    Pemetrexed  ?    Carboplatin  ?    Bevacizumab-xxxx  ? ? ?Bevacizumab 15 mg/kg q21d: ?  A cycle is every 21 days: ?    Bevacizumab-xxxx  ? ?**Always confirm dose/schedule in your pharmacy ordering system** ? ?REASON: Disease Progression ?PRIOR TREATMENT: [id number]: Bevacizumab 15 mg/kg + Carboplatin AUC=5 + Pemetrexed 500 mg/m2 q21 Days x 4-6 Cycles Followed by Bevacizumab 15 mg/kg Maintenance q21 Days Until Progression or Unacceptable Toxicity ?TREATMENT RESPONSE: Partial Response (PR) ? ?START ON PATHWAY REGIMEN - Mesothelioma ? ? ?  A cycle is every 21 days: ?    Pemetrexed  ?    Carboplatin  ? ?**Always confirm dose/schedule in your pharmacy ordering system** ? ?Patient Characteristics: ?Relapsed or Progressive Disease, [place], Prior Chemotherapy ?Therapeutic Status: Relapsed or Progressive Disease ? ?Intent of Therapy: ?Non-Curative / Palliative Intent, Discussed with Patient ?

## 2021-06-23 NOTE — Progress Notes (Signed)
?    [facility] ?[phone]   [phone] ? ?OFFICE PROGRESS NOTE ? ?[name], MD ?[address] ?[place] [address] ? ?DIAGNOSIS: Stage I/II malignant pleural mesothelioma involving the left hemithorax diagnosed in September 2021. ? ?PRIOR THERAPY: Systemic chemotherapy with carboplatin for AUC of 5, Alimta 500 mg/M2 and Avastin 15 mg/KG every 3 weeks.  Status post 23 cycles.  Starting from cycle #7 the patient will be on treatment with maintenance Avastin 15 mg/KG every 3 weeks.  His treatment with Avastin was discontinued secondary to suspicious GI perforation. ? ?CURRENT THERAPY: Systemic chemotherapy again with carboplatin for AUC of 5 and Alimta 500 Mg/M2 every 3 weeks.  First dose 06/30/2021 ? ?INTERVAL HISTORY: ?Jack Haynes 81 y.o. male returns to the clinic today for follow-up visit accompanied by his girlfriend, Jack Haynes.  The patient has been complaining of increasing fatigue and weakness as well as abdominal distention and shortness of breath recently.  He was seen at the emergency department on 06/13/2021 with generalized weakness and fatigue as well as worsening abdominal pain and hoarseness of his voice.  His girlfriend checked his blood pressure and he was hypotensive at home.  During his evaluation at the emergency department he had CT scan of the abdomen pelvis as well as CT angiogram of the chest and that showed no evidence for pulmonary embolism but there was progressive mesothelioma involving the left hemothorax with associated mediastinal nodal metastasis and pericardial metastasis.  There was also multifocal hepatic metastasis that is new/progressive and new peritoneal disease in the abdomen/pelvis with omental caking and abdominal pelvic ascites.  There was also additional upper abdominal nodal metastasis versus soft tissue nodule.  The patient came to the clinic today for evaluation and recommendation regarding his condition.  His treatment  with Avastin was on hold after cycle #23 because of diverticulitis and suspicious microperforation.  The patient continues to complain of the hoarseness of his voice as well as shortness of breath at baseline increased with exertion and left-sided chest as well as abdominal pain.  He has no nausea, vomiting, diarrhea or constipation.  He has no headache or visual changes.  He has no significant weight loss.  He continues to have abdominal distention and likely ascites accumulation.  He is here today for evaluation and discussion of his treatment options based on the new changes in his condition. ? ? ?MEDICAL HISTORY: ?Past Medical History:  ?Diagnosis Date  ? Arthritis   ? Cancer [facility])   ? hx of prostatae cancer  ? Diverticulitis   ? Dyspnea   ? until chest tube placed  ? Hypertension   ? Mesothelioma [facility])   ? ? ?ALLERGIES:  has No Known Allergies. ? ?MEDICATIONS:  ?Current Outpatient Medications  ?Medication Sig Dispense Refill  ? acetaminophen (TYLENOL) 500 MG tablet Take 2 tablets (1,000 mg total) by mouth every 6 (six) hours as needed for mild pain or fever. (Patient taking differently: Take 500 mg by mouth every 6 (six) hours as needed for mild pain or fever.) 30 tablet 0  ? aspirin EC 81 MG tablet Take 81 mg by mouth daily. Swallow whole.    ? calcium carbonate (TUMS - DOSED IN MG ELEMENTAL CALCIUM) 500 MG chewable tablet Chew 1,000 mg by mouth daily as needed for indigestion or heartburn.    ? Cetirizine HCl (ZYRTEC ALLERGY) 10 MG CAPS Zyrtec 10 mg capsule ? Take by oral route.    ? ferrous sulfate 325 (65  FE) MG tablet Take 325 mg by mouth daily with breakfast.    ? fluticasone (FLONASE) 50 MCG/ACT nasal spray Place 1 spray into both nostrils daily. 16 mL 0  ? folic acid (FOLVITE) 1 MG tablet TAKE 1 TABLET BY MOUTH EVERY DAY 90 tablet 1  ? JARDIANCE 10 MG TABS tablet TAKE 1 TABLET BY MOUTH DAILY BEFORE BREAKFAST. 90 tablet 1  ? lidocaine-prilocaine (EMLA) cream Apply to Port-A-Cath site 30-60-minute before  treatment. 30 g 0  ? losartan-hydrochlorothiazide (HYZAAR) 100-12.5 MG tablet TAKE 1 TABLET BY MOUTH EVERY DAY 90 tablet 3  ? metFORMIN (GLUCOPHAGE) 500 MG tablet TAKE 1 TABLET BY MOUTH 2 TIMES DAILY WITH A MEAL. 180 tablet 3  ? ondansetron (ZOFRAN ODT) 4 MG disintegrating tablet Take 1 tablet (4 mg total) by mouth every 8 (eight) hours as needed for nausea or vomiting. Starting 3 days after chemotherapy 30 tablet 1  ? pantoprazole (PROTONIX) 40 MG tablet Take 1 tablet (40 mg total) by mouth daily. 30 tablet 0  ? psyllium (METAMUCIL) 58.6 % powder Take 1 packet by mouth 3 (three) times daily.    ? rosuvastatin (CRESTOR) 20 MG tablet TAKE 1 TABLET BY MOUTH EVERY DAY 90 tablet 3  ? senna (SENOKOT) 8.6 MG tablet Take 1 tablet by mouth daily.    ? simethicone (MYLICON) 638 MG chewable tablet Chew 125 mg by mouth daily as needed (gas).    ? traMADol (ULTRAM) 50 MG tablet Take 1 tablet (50 mg total) by mouth every 8 (eight) hours as needed for up to 20 days. 30 tablet 1  ? ?No current facility-administered medications for this visit.  ? ?Facility-Administered Medications Ordered in Other Visits  ?Medication Dose Route Frequency Provider Last Rate Last Admin  ? heparin lock flush 100 unit/mL  250 Units Intracatheter Once Curt Bears, MD      ? ? ?SURGICAL HISTORY:  ?Past Surgical History:  ?Procedure Laterality Date  ? APPENDECTOMY    ? [place]  ? disectomy  ? CHEST TUBE INSERTION Left 11/13/2019  ? Procedure: INSERTION PLEURAL DRAINAGE CATHETER;  Surgeon: Grace Isaac, MD;  Location: [facility];  Service: Thoracic;  Laterality: Left;  ? [place]  ? injury  ? IR PORT REPAIR CENTRAL VENOUS ACCESS DEVICE  05/27/2020  ? JOINT REPLACEMENT Left 2010  ? left knee replacement  ? KNEE SURGERY    ? PLEURAL BIOPSY Left 11/13/2019  ? Procedure: PLEURAL BIOPSY;  Surgeon: Grace Isaac, MD;  Location: [facility];  Service: Thoracic;  Laterality: Left;  ? PORTACATH PLACEMENT Left 11/26/2019  ? Procedure:  INSERTION PORT-A-CATH BARD POWERPORT 9.6 FR CATHETER;  Surgeon: Grace Isaac, MD;  Location: [employer] [facility];  Service: Thoracic;  Laterality: Left;  ? PROSTATE SURGERY    ? REMOVAL OF PLEURAL DRAINAGE CATHETER Left 11/26/2019  ? Procedure: REMOVAL OF PLEURAL DRAINAGE CATHETER;  Surgeon: Grace Isaac, MD;  Location: [facility];  Service: Thoracic;  Laterality: Left;  ? SPINE SURGERY    ? TALC PLEURODESIS Left 11/13/2019  ? Procedure: possible TALC PLEURADESIS;  Surgeon: Grace Isaac, MD;  Location: [facility];  Service: Thoracic;  Laterality: Left;  ? TOTAL KNEE ARTHROPLASTY  02/01/2012  ? Procedure: TOTAL KNEE ARTHROPLASTY;  Surgeon: Sydnee Cabal, MD;  Location: [facility];  Service: Orthopedics;  Laterality: Left;  ? VIDEO ASSISTED THORACOSCOPY Left 11/13/2019  ? Procedure: VIDEO ASSISTED THORACOSCOPY;  Surgeon: Grace Isaac, MD;  Location: [facility];  Service: Thoracic;  Laterality: Left;  ? VIDEO BRONCHOSCOPY N/A 11/13/2019  ? Procedure: VIDEO BRONCHOSCOPY;  Surgeon: Grace Isaac, MD;  Location: [facility];  Service: Thoracic;  Laterality: N/A;  ? ? ?REVIEW OF SYSTEMS:  Constitutional: positive for anorexia and fatigue ?Eyes: negative ?Ears, nose, mouth, throat, and face: positive for hoarseness ?Respiratory: positive for dyspnea on exertion and pleurisy/chest pain ?Cardiovascular: negative ?Gastrointestinal: positive for abdominal pain and abdominal distention ?Genitourinary:negative ?Integument/breast: negative ?Hematologic/lymphatic: negative ?Musculoskeletal:positive for muscle weakness ?Neurological: negative ?Behavioral/Psych: negative ?Endocrine: negative ?Allergic/Immunologic: negative  ? ?PHYSICAL EXAMINATION: General appearance: alert, cooperative, fatigued, and no distress ?Head: Normocephalic, without obvious abnormality, atraumatic ?Neck: no adenopathy, no JVD, supple, symmetrical, trachea midline, and thyroid not enlarged, symmetric, no tenderness/mass/nodules ?Lymph nodes: Cervical, supraclavicular,  and axillary nodes normal. ?Resp: diminished breath sounds LLL and dullness to percussion LLL ?Back: symmetric, no curvature. ROM normal. No CVA tenderness. ?Cardio: regular rate and rhythm, S1, S2 normal,

## 2021-06-25 ENCOUNTER — Ambulatory Visit: Payer: PPO

## 2021-06-25 ENCOUNTER — Telehealth: Payer: Self-pay | Admitting: Internal Medicine

## 2021-06-25 ENCOUNTER — Ambulatory Visit (HOSPITAL_COMMUNITY)
Admission: RE | Admit: 2021-06-25 | Discharge: 2021-06-25 | Disposition: A | Discharge status: Home / Self Care | Payer: PPO | Source: Ambulatory Visit | Attending: Internal Medicine | Admitting: Internal Medicine

## 2021-06-25 DIAGNOSIS — C450 Mesothelioma of pleura: Secondary | ICD-10-CM | POA: Insufficient documentation

## 2021-06-25 DIAGNOSIS — C459 Mesothelioma, unspecified: Secondary | ICD-10-CM | POA: Diagnosis not present

## 2021-06-25 DIAGNOSIS — C771 Secondary and unspecified malignant neoplasm of intrathoracic lymph nodes: Secondary | ICD-10-CM | POA: Insufficient documentation

## 2021-06-25 DIAGNOSIS — R188 Other ascites: Secondary | ICD-10-CM | POA: Diagnosis not present

## 2021-06-25 MED ORDER — LIDOCAINE HCL 1 % IJ SOLN
INTRAMUSCULAR | Status: AC
  Filled 2021-06-25: qty 20

## 2021-06-25 NOTE — Telephone Encounter (Signed)
Scheduled per 05/09 los, patient has been called an notified. ?

## 2021-06-26 ENCOUNTER — Inpatient Hospital Stay: Payer: PPO

## 2021-06-29 MED FILL — Fosaprepitant Dimeglumine For IV Infusion 150 MG (Base Eq): INTRAVENOUS | Qty: 5 | Status: AC

## 2021-06-29 MED FILL — Dexamethasone Sodium Phosphate Inj 100 MG/10ML: INTRAMUSCULAR | Qty: 1 | Status: AC

## 2021-06-30 ENCOUNTER — Other Ambulatory Visit: Payer: Self-pay

## 2021-06-30 ENCOUNTER — Inpatient Hospital Stay: Payer: PPO

## 2021-06-30 ENCOUNTER — Other Ambulatory Visit (HOSPITAL_COMMUNITY): Payer: PPO

## 2021-06-30 ENCOUNTER — Telehealth: Payer: Self-pay

## 2021-06-30 ENCOUNTER — Other Ambulatory Visit (HOSPITAL_COMMUNITY): Payer: Self-pay

## 2021-06-30 ENCOUNTER — Other Ambulatory Visit: Payer: Self-pay | Admitting: Physician Assistant

## 2021-06-30 VITALS — BP 124/73 | HR 88 | Temp 98.2°F | Resp 16 | Wt 183.0 lb

## 2021-06-30 DIAGNOSIS — C450 Mesothelioma of pleura: Secondary | ICD-10-CM

## 2021-06-30 DIAGNOSIS — C457 Mesothelioma of other sites: Secondary | ICD-10-CM

## 2021-06-30 DIAGNOSIS — Z95828 Presence of other vascular implants and grafts: Secondary | ICD-10-CM

## 2021-06-30 LAB — CMP (CANCER CENTER ONLY)
ALT: 17 U/L (ref 0–44)
AST: 11 U/L — ABNORMAL LOW (ref 15–41)
Albumin: 3.6 g/dL (ref 3.5–5.0)
Alkaline Phosphatase: 61 U/L (ref 38–126)
Anion gap: 8 (ref 5–15)
BUN: 28 mg/dL — ABNORMAL HIGH (ref 8–23)
CO2: 28 mmol/L (ref 22–32)
Calcium: 9.3 mg/dL (ref 8.9–10.3)
Chloride: 99 mmol/L (ref 98–111)
Creatinine: 0.9 mg/dL (ref 0.61–1.24)
GFR, Estimated: >60 mL/min (ref >60)
Glucose, Bld: 164 mg/dL — ABNORMAL HIGH (ref 70–99)
Potassium: 4.1 mmol/L (ref 3.5–5.1)
Sodium: 135 mmol/L (ref 135–145)
Total Bilirubin: 0.3 mg/dL (ref 0.3–1.2)
Total Protein: 6.6 g/dL (ref 6.5–8.1)

## 2021-06-30 LAB — CBC WITH DIFFERENTIAL (CANCER CENTER ONLY)
Abs Immature Granulocytes: 0.45 10*3/uL — ABNORMAL HIGH (ref 0.00–0.07)
Basophils Absolute: 0 10*3/uL (ref 0.0–0.1)
Basophils Relative: 0 %
Eosinophils Absolute: 0 10*3/uL (ref 0.0–0.5)
Eosinophils Relative: 0 %
HCT: 36.9 % — ABNORMAL LOW (ref 39.0–52.0)
Hemoglobin: 12.3 g/dL — ABNORMAL LOW (ref 13.0–17.0)
Immature Granulocytes: 3 %
Lymphocytes Relative: 7 %
Lymphs Abs: 1 10*3/uL (ref 0.7–4.0)
MCH: 28.9 pg (ref 26.0–34.0)
MCHC: 33.3 g/dL (ref 30.0–36.0)
MCV: 86.6 fL (ref 80.0–100.0)
Monocytes Absolute: 0.7 10*3/uL (ref 0.1–1.0)
Monocytes Relative: 5 %
Neutro Abs: 11.6 10*3/uL — ABNORMAL HIGH (ref 1.7–7.7)
Neutrophils Relative %: 85 %
Platelet Count: 229 10*3/uL (ref 150–400)
RBC: 4.26 MIL/uL (ref 4.22–5.81)
RDW: 16 % — ABNORMAL HIGH (ref 11.5–15.5)
WBC Count: 13.9 10*3/uL — ABNORMAL HIGH (ref 4.0–10.5)
nRBC: 0 % (ref 0.0–0.2)

## 2021-06-30 MED ORDER — HEPARIN SOD (PORK) LOCK FLUSH 100 UNIT/ML IV SOLN
500.0000 [IU] | Freq: Once | INTRAVENOUS | Status: AC | PRN
  Administered 2021-06-30: 500 [IU]

## 2021-06-30 MED ORDER — PALONOSETRON HCL INJECTION 0.25 MG/5ML
0.2500 mg | Freq: Once | INTRAVENOUS | Status: AC
  Administered 2021-06-30: 0.25 mg via INTRAVENOUS
  Filled 2021-06-30: qty 5

## 2021-06-30 MED ORDER — DIPHENHYDRAMINE HCL 50 MG/ML IJ SOLN
25.0000 mg | Freq: Once | INTRAMUSCULAR | Status: AC
  Administered 2021-06-30: 25 mg via INTRAVENOUS
  Filled 2021-06-30: qty 1

## 2021-06-30 MED ORDER — SODIUM CHLORIDE 0.9 % IV SOLN
500.0000 mg/m2 | Freq: Once | INTRAVENOUS | Status: AC
  Administered 2021-06-30: 1000 mg via INTRAVENOUS
  Filled 2021-06-30: qty 40

## 2021-06-30 MED ORDER — SODIUM CHLORIDE 0.9 % IV SOLN: Freq: Once | INTRAVENOUS | Status: AC

## 2021-06-30 MED ORDER — SODIUM CHLORIDE 0.9 % IV SOLN
150.0000 mg | Freq: Once | INTRAVENOUS | Status: AC
  Administered 2021-06-30: 150 mg via INTRAVENOUS
  Filled 2021-06-30: qty 150

## 2021-06-30 MED ORDER — SODIUM CHLORIDE 0.9% FLUSH
10.0000 mL | INTRAVENOUS | Status: DC | PRN
  Administered 2021-06-30: 10 mL

## 2021-06-30 MED ORDER — SODIUM CHLORIDE 0.9 % IV SOLN
10.0000 mg | Freq: Once | INTRAVENOUS | Status: AC
  Administered 2021-06-30: 10 mg via INTRAVENOUS
  Filled 2021-06-30: qty 10

## 2021-06-30 MED ORDER — SODIUM CHLORIDE 0.9% FLUSH
10.0000 mL | Freq: Once | INTRAVENOUS | Status: AC
  Administered 2021-06-30: 10 mL

## 2021-06-30 MED ORDER — FAMOTIDINE IN NACL 20-0.9 MG/50ML-% IV SOLN
20.0000 mg | Freq: Once | INTRAVENOUS | Status: AC
  Administered 2021-06-30: 20 mg via INTRAVENOUS
  Filled 2021-06-30: qty 50

## 2021-06-30 MED ORDER — SODIUM CHLORIDE 0.9 % IV SOLN
467.5000 mg | Freq: Once | INTRAVENOUS | Status: AC
  Administered 2021-06-30: 470 mg via INTRAVENOUS
  Filled 2021-06-30: qty 47

## 2021-06-30 NOTE — Telephone Encounter (Signed)
Notified Patient of prior authorization approval for Ondansetron 4mg  Tablets. Medication is approved through 02/14/2022. No other needs or concerns voiced at this time. ?

## 2021-06-30 NOTE — Patient Instructions (Signed)
[facility]  Discharge Instructions: ?Thank you for choosing [facility] to provide your oncology and hematology care.  ? ?If you have a lab appointment with the [facility], please go directly to the [facility] and check in at the registration area. ?  ?Wear comfortable clothing and clothing appropriate for easy access to any Portacath or PICC line.  ? ?We strive to give you quality time with your provider. You may need to reschedule your appointment if you arrive late (15 or more minutes).  Arriving late affects you and other patients whose appointments are after yours.  Also, if you miss three or more appointments without notifying the office, you may be dismissed from the clinic at the provider?s discretion.    ?  ?For prescription refill requests, have your pharmacy contact our office and allow 72 hours for refills to be completed.   ? ?Today you received the following chemotherapy and/or immunotherapy agents: pemetrexed, carboplatin    ?  ?To help prevent nausea and vomiting after your treatment, we encourage you to take your nausea medication as directed. ? ?BELOW ARE SYMPTOMS THAT SHOULD BE REPORTED IMMEDIATELY: ?*FEVER GREATER THAN 100.4 F (38 ?C) OR HIGHER ?*CHILLS OR SWEATING ?*NAUSEA AND VOMITING THAT IS NOT CONTROLLED WITH YOUR NAUSEA MEDICATION ?*UNUSUAL SHORTNESS OF BREATH ?*UNUSUAL BRUISING OR BLEEDING ?*URINARY PROBLEMS (pain or burning when urinating, or frequent urination) ?*BOWEL PROBLEMS (unusual diarrhea, constipation, pain near the anus) ?TENDERNESS IN MOUTH AND THROAT WITH OR WITHOUT PRESENCE OF ULCERS (sore throat, sores in mouth, or a toothache) ?UNUSUAL RASH, SWELLING OR PAIN  ?UNUSUAL VAGINAL DISCHARGE OR ITCHING  ? ?Items with * indicate a potential emergency and should be followed up as soon as possible or go to the Emergency Department if any problems should occur. ? ?Please show the CHEMOTHERAPY ALERT CARD or IMMUNOTHERAPY ALERT CARD  at check-in to the Emergency Department and triage nurse. ? ?Should you have questions after your visit or need to cancel or reschedule your appointment, please contact [facility]  Dept: [phone]  and follow the prompts.  Office hours are 8:00 a.m. to 4:30 p.m. Monday - Friday. Please note that voicemails left after 4:00 p.m. may not be returned until the following business day.  We are closed weekends and major holidays. You have access to a nurse at all times for urgent questions. Please call the main number to the clinic Dept: [phone] and follow the prompts. ? ? ?For any non-urgent questions, you may also contact your provider using MyChart. We now offer e-Visits for anyone 57 and older to request care online for non-urgent symptoms. For details visit [email or web address]. ?  ?Also download the MyChart app! Go to the app store, search "MyChart", open the app, select [facility], and log in with your MyChart username and password. ? ?Due to Covid, a mask is required upon entering the hospital/clinic. If you do not have a mask, one will be given to you upon arrival. For doctor visits, patients may have 1 support person aged 49 or older with them. For treatment visits, patients cannot have anyone with them due to current Covid guidelines and our immunocompromised population.  ? ?Pemetrexed injection ?What is this medication? ?PEMETREXED (PEM e TREX ed) is a chemotherapy drug used to treat lung cancers like non-small cell lung cancer and mesothelioma. It may also be used to treat other cancers. ?This medicine may be used for other purposes; ask your health care  provider or pharmacist if you have questions. ?COMMON BRAND NAME(S): Alimta, PEMFEXY ?What should I tell my care team before I take this medication? ?They need to know if you have any of these conditions: ?infection (especially a virus infection such as chickenpox, cold sores, or herpes) ?kidney disease ?low blood  counts, like low white cell, platelet, or red cell counts ?lung or breathing disease, like asthma ?radiation therapy ?an unusual or allergic reaction to pemetrexed, other medicines, foods, dyes, or preservative ?pregnant or trying to get pregnant ?breast-feeding ?How should I use this medication? ?This drug is given as an infusion into a vein. It is administered in a hospital or clinic by a specially trained health care professional. ?Talk to your pediatrician regarding the use of this medicine in children. Special care may be needed. ?Overdosage: If you think you have taken too much of this medicine contact a poison control center or emergency room at once. ?NOTE: This medicine is only for you. Do not share this medicine with others. ?What if I miss a dose? ?It is important not to miss your dose. Call your doctor or health care professional if you are unable to keep an appointment. ?What may interact with this medication? ?This medicine may interact with the following medications: ?Ibuprofen ?This list may not describe all possible interactions. Give your health care provider a list of all the medicines, herbs, non-prescription drugs, or dietary supplements you use. Also tell them if you smoke, drink alcohol, or use illegal drugs. Some items may interact with your medicine. ?What should I watch for while using this medication? ?Visit your doctor for checks on your progress. This drug may make you feel generally unwell. This is not uncommon, as chemotherapy can affect healthy cells as well as cancer cells. Report any side effects. Continue your course of treatment even though you feel ill unless your doctor tells you to stop. ?In some cases, you may be given additional medicines to help with side effects. Follow all directions for their use. ?Call your doctor or health care professional for advice if you get a fever, chills or sore throat, or other symptoms of a cold or flu. Do not treat yourself. This drug  decreases your body's ability to fight infections. Try to avoid being around people who are sick. ?This medicine may increase your risk to bruise or bleed. Call your doctor or health care professional if you notice any unusual bleeding. ?Be careful brushing and flossing your teeth or using a toothpick because you may get an infection or bleed more easily. If you have any dental work done, tell your dentist you are receiving this medicine. ?Avoid taking products that contain aspirin, acetaminophen, ibuprofen, naproxen, or ketoprofen unless instructed by your doctor. These medicines may hide a fever. ?Call your doctor or health care professional if you get diarrhea or mouth sores. Do not treat yourself. ?To protect your kidneys, drink water or other fluids as directed while you are taking this medicine. ?Do not become pregnant while taking this medicine or for 6 months after stopping it. Women should inform their doctor if they wish to become pregnant or think they might be pregnant. Men should not father a child while taking this medicine and for 3 months after stopping it. This may interfere with the ability to father a child. You should talk to your doctor or health care professional if you are concerned about your fertility. There is a potential for serious side effects to an unborn child. Talk to  your health care professional or pharmacist for more information. Do not breast-feed an infant while taking this medicine or for 1 week after stopping it. ?What side effects may I notice from receiving this medication? ?Side effects that you should report to your doctor or health care professional as soon as possible: ?allergic reactions like skin rash, itching or hives, swelling of the face, lips, or tongue ?breathing problems ?redness, blistering, peeling or loosening of the skin, including inside the mouth ?signs and symptoms of bleeding such as bloody or black, tarry stools; red or dark-brown urine; spitting up blood  or brown material that looks like coffee grounds; red spots on the skin; unusual bruising or bleeding from the eye, gums, or nose ?signs and symptoms of infection like fever or chills; cough; sore throat; pain o

## 2021-07-02 ENCOUNTER — Ambulatory Visit: Payer: PPO | Admitting: Internal Medicine

## 2021-07-02 ENCOUNTER — Telehealth: Payer: Self-pay | Admitting: Emergency Medicine

## 2021-07-02 MED ORDER — PANTOPRAZOLE SODIUM 40 MG PO TBEC
40.0000 mg | DELAYED_RELEASE_TABLET | Freq: Every day | ORAL | 0 refills | Status: DC
Start: 2021-07-02 — End: 2021-07-26

## 2021-07-02 NOTE — Telephone Encounter (Signed)
Patient was give a rx for pantoprazole from the PA at the Urgent Care at [facility] - patient was told that if this helped to contact his PCP to get a RX.   Patients states that it is helping.  Please send to [facility] on [address]

## 2021-07-06 LAB — HM DIABETES EYE EXAM

## 2021-07-07 ENCOUNTER — Other Ambulatory Visit: Payer: Self-pay

## 2021-07-07 ENCOUNTER — Inpatient Hospital Stay: Payer: PPO

## 2021-07-07 ENCOUNTER — Encounter: Payer: Self-pay | Admitting: Internal Medicine

## 2021-07-07 ENCOUNTER — Inpatient Hospital Stay (HOSPITAL_BASED_OUTPATIENT_CLINIC_OR_DEPARTMENT_OTHER): Payer: PPO | Admitting: Internal Medicine

## 2021-07-07 ENCOUNTER — Other Ambulatory Visit: Payer: Self-pay | Admitting: Medical Oncology

## 2021-07-07 ENCOUNTER — Other Ambulatory Visit: Payer: PPO

## 2021-07-07 VITALS — BP 139/86 | HR 106 | Temp 98.5°F | Resp 17 | Wt 183.1 lb

## 2021-07-07 DIAGNOSIS — C450 Mesothelioma of pleura: Secondary | ICD-10-CM

## 2021-07-07 DIAGNOSIS — C457 Mesothelioma of other sites: Secondary | ICD-10-CM | POA: Diagnosis not present

## 2021-07-07 DIAGNOSIS — Z5111 Encounter for antineoplastic chemotherapy: Secondary | ICD-10-CM

## 2021-07-07 DIAGNOSIS — C771 Secondary and unspecified malignant neoplasm of intrathoracic lymph nodes: Secondary | ICD-10-CM | POA: Diagnosis not present

## 2021-07-07 DIAGNOSIS — Z95828 Presence of other vascular implants and grafts: Secondary | ICD-10-CM

## 2021-07-07 LAB — CMP (CANCER CENTER ONLY)
ALT: 18 U/L (ref 0–44)
AST: 13 U/L — ABNORMAL LOW (ref 15–41)
Albumin: 3.1 g/dL — ABNORMAL LOW (ref 3.5–5.0)
Alkaline Phosphatase: 53 U/L (ref 38–126)
Anion gap: 7 (ref 5–15)
BUN: 23 mg/dL (ref 8–23)
CO2: 26 mmol/L (ref 22–32)
Calcium: 8.4 mg/dL — ABNORMAL LOW (ref 8.9–10.3)
Chloride: 98 mmol/L (ref 98–111)
Creatinine: 0.86 mg/dL (ref 0.61–1.24)
GFR, Estimated: >60 mL/min (ref >60)
Glucose, Bld: 189 mg/dL — ABNORMAL HIGH (ref 70–99)
Potassium: 4.2 mmol/L (ref 3.5–5.1)
Sodium: 131 mmol/L — ABNORMAL LOW (ref 135–145)
Total Bilirubin: 0.5 mg/dL (ref 0.3–1.2)
Total Protein: 6.2 g/dL — ABNORMAL LOW (ref 6.5–8.1)

## 2021-07-07 LAB — CBC WITH DIFFERENTIAL (CANCER CENTER ONLY)
Abs Immature Granulocytes: 0.04 10*3/uL (ref 0.00–0.07)
Basophils Absolute: 0 10*3/uL (ref 0.0–0.1)
Basophils Relative: 0 %
Eosinophils Absolute: 0 10*3/uL (ref 0.0–0.5)
Eosinophils Relative: 1 %
HCT: 34.3 % — ABNORMAL LOW (ref 39.0–52.0)
Hemoglobin: 11.4 g/dL — ABNORMAL LOW (ref 13.0–17.0)
Immature Granulocytes: 1 %
Lymphocytes Relative: 14 %
Lymphs Abs: 0.4 10*3/uL — ABNORMAL LOW (ref 0.7–4.0)
MCH: 28.6 pg (ref 26.0–34.0)
MCHC: 33.2 g/dL (ref 30.0–36.0)
MCV: 86 fL (ref 80.0–100.0)
Monocytes Absolute: 0.2 10*3/uL (ref 0.1–1.0)
Monocytes Relative: 6 %
Neutro Abs: 2.4 10*3/uL (ref 1.7–7.7)
Neutrophils Relative %: 78 %
Platelet Count: 111 10*3/uL — ABNORMAL LOW (ref 150–400)
RBC: 3.99 MIL/uL — ABNORMAL LOW (ref 4.22–5.81)
RDW: 15.9 % — ABNORMAL HIGH (ref 11.5–15.5)
WBC Count: 3.1 10*3/uL — ABNORMAL LOW (ref 4.0–10.5)
nRBC: 0 % (ref 0.0–0.2)

## 2021-07-07 MED ORDER — SODIUM CHLORIDE 0.9% FLUSH
10.0000 mL | Freq: Once | INTRAVENOUS | Status: AC
  Administered 2021-07-07: 10 mL

## 2021-07-07 NOTE — Progress Notes (Signed)
[place] [phone]   [phone]  OFFICE PROGRESS NOTE  [name], MD [place] [address]  DIAGNOSIS: Stage I/II malignant pleural mesothelioma involving the left hemithorax diagnosed in September 2021.  PRIOR THERAPY: Systemic chemotherapy with carboplatin for AUC of 5, Alimta 500 mg/M2 and Avastin 15 mg/KG every 3 weeks.  Status post 23 cycles.  Starting from cycle #7 the patient will be on treatment with maintenance Avastin 15 mg/KG every 3 weeks.  His treatment with Avastin was discontinued secondary to suspicious GI perforation.  CURRENT THERAPY: Systemic chemotherapy again with carboplatin for AUC of 5 and Alimta 500 Mg/M2 every 3 weeks.  First dose 06/30/2021.  Status post 1 cycle.  INTERVAL HISTORY: Jack Haynes 81 y.o. male returns to the clinic today for follow-up visit accompanied by his girlfriend [name].  The patient continues to complain of  increasing fatigue and weakness as well as shortness of breath.  He also has mild cough and sore throat.  He denied having any chest pain or hemoptysis.  He has no nausea, vomiting, diarrhea or constipation.  He tolerated the first week of his treatment fairly well except for the fatigue.  He is here today for evaluation and repeat blood work.   MEDICAL HISTORY: Past Medical History:  Diagnosis Date   Arthritis    Cancer ([place])    hx of prostatae cancer   Diverticulitis    Dyspnea    until chest tube placed   Hypertension    Mesothelioma ([place])     ALLERGIES:  has No Known Allergies.  MEDICATIONS:  Current Outpatient Medications  Medication Sig Dispense Refill   acetaminophen (TYLENOL) 500 MG tablet Take 2 tablets (1,000 mg total) by mouth every 6 (six) hours as needed for mild pain or fever. (Patient taking differently: Take 500 mg by mouth every 6 (six) hours as needed for mild pain or fever.) 30 tablet 0   aspirin EC 81 MG tablet Take 81 mg by mouth  daily. Swallow whole.     calcium carbonate (TUMS - DOSED IN MG ELEMENTAL CALCIUM) 500 MG chewable tablet Chew 1,000 mg by mouth daily as needed for indigestion or heartburn.     Cetirizine HCl (ZYRTEC ALLERGY) 10 MG CAPS Zyrtec 10 mg capsule  Take by oral route.     dexamethasone (DECADRON) 4 MG tablet 1 tablet p.o. twice daily the day before, day of and day after chemotherapy every 3 weeks 40 tablet 1   ferrous sulfate 325 (65 FE) MG tablet Take 325 mg by mouth daily with breakfast.     fluticasone (FLONASE) 50 MCG/ACT nasal spray Place 1 spray into both nostrils daily. 16 mL 0   folic acid (FOLVITE) 1 MG tablet Take 1 tablet (1 mg total) by mouth daily. 30 tablet 4   JARDIANCE 10 MG TABS tablet TAKE 1 TABLET BY MOUTH DAILY BEFORE BREAKFAST. 90 tablet 1   lidocaine-prilocaine (EMLA) cream Apply to Port-A-Cath site 30-60-minute before treatment. 30 g 0   losartan-hydrochlorothiazide (HYZAAR) 100-12.5 MG tablet TAKE 1 TABLET BY MOUTH EVERY DAY 90 tablet 3   metFORMIN (GLUCOPHAGE) 500 MG tablet TAKE 1 TABLET BY MOUTH 2 TIMES DAILY WITH A MEAL. 180 tablet 3   ondansetron (ZOFRAN-ODT) 4 MG disintegrating tablet TAKE 1 TABLET (4 MG TOTAL) BY MOUTH EVERY 8 (EIGHT) HOURS AS NEEDED FOR NAUSEA OR VOMITING. STARTING 3 DAYS AFTER CHEMOTHERAPY 30 tablet 1   pantoprazole (PROTONIX) 40 MG  tablet Take 1 tablet (40 mg total) by mouth daily. 30 tablet 0   prochlorperazine (COMPAZINE) 10 MG tablet Take 1 tablet (10 mg total) by mouth every 6 (six) hours as needed for nausea or vomiting. 30 tablet 0   psyllium (METAMUCIL) 58.6 % powder Take 1 packet by mouth 3 (three) times daily.     rosuvastatin (CRESTOR) 20 MG tablet TAKE 1 TABLET BY MOUTH EVERY DAY 90 tablet 3   senna (SENOKOT) 8.6 MG tablet Take 1 tablet by mouth daily.     simethicone (MYLICON) 259 MG chewable tablet Chew 125 mg by mouth daily as needed (gas).     traMADol (ULTRAM) 50 MG tablet Take 1 tablet (50 mg total) by mouth every 12 (twelve) hours as  needed. 60 tablet 0   No current facility-administered medications for this visit.    SURGICAL HISTORY:  Past Surgical History:  Procedure Laterality Date   APPENDECTOMY     BACK SURGERY  1998   disectomy   CHEST TUBE INSERTION Left 11/13/2019   Procedure: INSERTION PLEURAL DRAINAGE CATHETER;  Surgeon: Grace Isaac, MD;  Location: [facility];  Service: Thoracic;  Laterality: Left;   EYE SURGERY Left 1965   injury   IR [facility]  05/27/2020   JOINT REPLACEMENT Left 2010   left knee replacement   KNEE SURGERY     PLEURAL BIOPSY Left 11/13/2019   Procedure: PLEURAL BIOPSY;  Surgeon: Grace Isaac, MD;  Location: [facility];  Service: Thoracic;  Laterality: Left;   PORTACATH PLACEMENT Left 11/26/2019   Procedure: INSERTION PORT-A-CATH BARD POWERPORT 9.6 FR CATHETER;  Surgeon: Grace Isaac, MD;  Location: [facility];  Service: Thoracic;  Laterality: Left;   PROSTATE SURGERY     REMOVAL OF PLEURAL DRAINAGE CATHETER Left 11/26/2019   Procedure: REMOVAL OF PLEURAL DRAINAGE CATHETER;  Surgeon: Grace Isaac, MD;  Location: [facility];  Service: Thoracic;  Laterality: Left;   SPINE SURGERY     TALC PLEURODESIS Left 11/13/2019   Procedure: possible TALC PLEURADESIS;  Surgeon: Grace Isaac, MD;  Location: [facility];  Service: Thoracic;  Laterality: Left;   TOTAL KNEE ARTHROPLASTY  02/01/2012   Procedure: TOTAL KNEE ARTHROPLASTY;  Surgeon: Sydnee Cabal, MD;  Location: [facility];  Service: Orthopedics;  Laterality: Left;   VIDEO ASSISTED THORACOSCOPY Left 11/13/2019   Procedure: VIDEO ASSISTED THORACOSCOPY;  Surgeon: Grace Isaac, MD;  Location: [facility];  Service: Thoracic;  Laterality: Left;   VIDEO BRONCHOSCOPY N/A 11/13/2019   Procedure: VIDEO BRONCHOSCOPY;  Surgeon: Grace Isaac, MD;  Location: [facility];  Service: Thoracic;  Laterality: N/A;    REVIEW OF SYSTEMS:  A comprehensive review of systems was negative except for: Constitutional: positive for  anorexia, fatigue, and weight loss Respiratory: positive for cough and dyspnea on exertion Musculoskeletal: positive for muscle weakness   PHYSICAL EXAMINATION: General appearance: alert, cooperative, fatigued, and no distress Head: Normocephalic, without obvious abnormality, atraumatic Neck: no adenopathy, no JVD, supple, symmetrical, trachea midline, and thyroid not enlarged, symmetric, no tenderness/mass/nodules Lymph nodes: Cervical, supraclavicular, and axillary nodes normal. Resp: diminished breath sounds LLL and dullness to percussion LLL Back: symmetric, no curvature. ROM normal. No CVA tenderness. Cardio: regular rate and rhythm, S1, S2 normal, no murmur, click, rub or gallop GI: abnormal findings:  ascites and distended Extremities: extremities normal, atraumatic, no cyanosis or edema  ECOG PERFORMANCE STATUS: 1 - Symptomatic but completely ambulatory  Blood pressure 139/86, pulse (!) 106, temperature  98.5 F (36.9 C), temperature source Tympanic, resp. rate 17, weight 183 lb 1.6 oz (83.1 kg), SpO2 98 %.  LABORATORY DATA: Lab Results  Component Value Date   WBC 3.1 (L) 07/07/2021   HGB 11.4 (L) 07/07/2021   HCT 34.3 (L) 07/07/2021   MCV 86.0 07/07/2021   PLT 111 (L) 07/07/2021      Chemistry      Component Value Date/Time   NA 135 06/30/2021 1257   NA 141 09/24/2019 0832   K 4.1 06/30/2021 1257   CL 99 06/30/2021 1257   CO2 28 06/30/2021 1257   BUN 28 (H) 06/30/2021 1257   BUN 17 09/24/2019 0832   CREATININE 0.90 06/30/2021 1257   CREATININE 1.01 12/24/2014 0916      Component Value Date/Time   CALCIUM 9.3 06/30/2021 1257   ALKPHOS 61 06/30/2021 1257   AST 11 (L) 06/30/2021 1257   ALT 17 06/30/2021 1257   BILITOT 0.3 06/30/2021 1257       RADIOGRAPHIC STUDIES: CT Angio Chest PE W and/or Wo Contrast  Result Date: 06/13/2021 CLINICAL DATA:  Hypotension, abdominal pain, malaise, mesothelioma, prostate cancer. EXAM: CT ANGIOGRAPHY CHEST CT ABDOMEN AND  PELVIS WITH CONTRAST TECHNIQUE: Multidetector CT imaging of the chest was performed using the standard protocol during bolus administration of intravenous contrast. Multiplanar CT image reconstructions and MIPs were obtained to evaluate the vascular anatomy. Multidetector CT imaging of the abdomen and pelvis was performed using the standard protocol during bolus administration of intravenous contrast. RADIATION DOSE REDUCTION: This exam was performed according to the departmental dose-optimization program which includes automated exposure control, adjustment of the mA and/or kV according to patient size and/or use of iterative reconstruction technique. CONTRAST:  159mL OMNIPAQUE IOHEXOL 350 MG/ML SOLN COMPARISON:  CT abdomen/pelvis dated 03/28/2021. CT chest dated 02/13/2021. FINDINGS: CTA CHEST FINDINGS Cardiovascular: Satisfactory opacification the bilateral pulmonary arteries to the segmental level. No evidence of pulmonary embolism. Although not tailored for evaluation of the thoracic aorta, there is no evidence of thoracic aortic aneurysm or dissection. The heart is normal in size. Progressive pericardial nodularity, measuring up to 5.4 cm along the left heart border (series 4/image 88), compatible with metastatic disease. Three vessel coronary atherosclerosis. Mediastinum/Nodes: Progressive mediastinal lymphadenopathy, including a 2.5 cm short axis prevascular node (series 4/image 48) which is essentially new. 1.6 cm short axis node anterior to the left mainstem bronchus (series 4/image 60), new. Visualized thyroid is unremarkable. Lungs/Pleura: Pleural-based thickening along the left hemithorax, progressive, corresponding to the patient's known mesothelioma. Progressive nodular implants along the anterior/inferior hemithorax (series 4/image 87) with progressive nodular thickening along the posterior lung base (series 4/image 100). Underlying scarring/fibrosis with volume loss in the left hemithorax, similar  to the prior. New 10 mm metastasis in the posterior right upper lobe (series 6/image 47). Scarring/atelectasis in the right lower lobe, new/progressive. Associated mild nodularity in the medial right lung base is nonspecific (series 6/image 95). No pneumothorax. Musculoskeletal: Degenerative changes of the thoracic spine. Review of the MIP images confirms the above findings. CT ABDOMEN and PELVIS FINDINGS Hepatobiliary: Multifocal hepatic metastases, approximately 8-10 in number, most of which are new. Dominant central right hepatic lobe lesion in segment 8 measures 3.9 cm, previously 13 mm. Gallbladder is unremarkable. No intrahepatic or extrahepatic ductal dilatation. Pancreas: Within normal limits. Spleen: Within normal limits. Adrenals/Urinary Tract: Adrenal glands are within normal limits. Kidneys are within normal limits.  No hydronephrosis. Bladder is within normal limits. Stomach/Bowel: Stomach is notable for a tiny hiatal hernia.  No evidence of bowel obstruction. Appendix is not discretely visualized. Sigmoid diverticulosis, without evidence of diverticulitis. Vascular/Lymphatic: No evidence of abdominal aortic aneurysm. Atherosclerotic calcifications of the abdominal aorta and branch vessels. 3.2 cm node/soft tissue nodule at the esophageal hiatus (series 2/image 22), new. Reproductive: Status post prostatectomy. Other: Small volume abdominopelvic ascites, new, including mesenteric ascites along the small bowel with tethering/mass effect (series 2/image 70), likely malignant. Additional frank peritoneal disease/omental caking beneath the anterior abdominal wall (series 2/image 49), new. Musculoskeletal: Mild degenerative changes of the lumbar spine. Review of the MIP images confirms the above findings. IMPRESSION: No evidence of pulmonary embolism. Progressive mesothelioma involving the left hemithorax, as above. Associated mediastinal nodal metastases and pericardial metastases, as above. Multifocal  hepatic metastases, new/progressive. New peritoneal disease in the abdomen/pelvis with omental caking and abdominopelvic ascites. Additional upper abdominal nodal metastasis versus soft tissue nodule, as above. Additional ancillary findings as above. Electronically Signed   By: Julian Hy M.D.   On: 06/13/2021 22:31   CT ABDOMEN PELVIS W CONTRAST  Result Date: 06/13/2021 CLINICAL DATA:  Hypotension, abdominal pain, malaise, mesothelioma, prostate cancer. EXAM: CT ANGIOGRAPHY CHEST CT ABDOMEN AND PELVIS WITH CONTRAST TECHNIQUE: Multidetector CT imaging of the chest was performed using the standard protocol during bolus administration of intravenous contrast. Multiplanar CT image reconstructions and MIPs were obtained to evaluate the vascular anatomy. Multidetector CT imaging of the abdomen and pelvis was performed using the standard protocol during bolus administration of intravenous contrast. RADIATION DOSE REDUCTION: This exam was performed according to the departmental dose-optimization program which includes automated exposure control, adjustment of the mA and/or kV according to patient size and/or use of iterative reconstruction technique. CONTRAST:  164mL OMNIPAQUE IOHEXOL 350 MG/ML SOLN COMPARISON:  CT abdomen/pelvis dated 03/28/2021. CT chest dated 02/13/2021. FINDINGS: CTA CHEST FINDINGS Cardiovascular: Satisfactory opacification the bilateral pulmonary arteries to the segmental level. No evidence of pulmonary embolism. Although not tailored for evaluation of the thoracic aorta, there is no evidence of thoracic aortic aneurysm or dissection. The heart is normal in size. Progressive pericardial nodularity, measuring up to 5.4 cm along the left heart border (series 4/image 88), compatible with metastatic disease. Three vessel coronary atherosclerosis. Mediastinum/Nodes: Progressive mediastinal lymphadenopathy, including a 2.5 cm short axis prevascular node (series 4/image 48) which is essentially  new. 1.6 cm short axis node anterior to the left mainstem bronchus (series 4/image 60), new. Visualized thyroid is unremarkable. Lungs/Pleura: Pleural-based thickening along the left hemithorax, progressive, corresponding to the patient's known mesothelioma. Progressive nodular implants along the anterior/inferior hemithorax (series 4/image 87) with progressive nodular thickening along the posterior lung base (series 4/image 100). Underlying scarring/fibrosis with volume loss in the left hemithorax, similar to the prior. New 10 mm metastasis in the posterior right upper lobe (series 6/image 47). Scarring/atelectasis in the right lower lobe, new/progressive. Associated mild nodularity in the medial right lung base is nonspecific (series 6/image 95). No pneumothorax. Musculoskeletal: Degenerative changes of the thoracic spine. Review of the MIP images confirms the above findings. CT ABDOMEN and PELVIS FINDINGS Hepatobiliary: Multifocal hepatic metastases, approximately 8-10 in number, most of which are new. Dominant central right hepatic lobe lesion in segment 8 measures 3.9 cm, previously 13 mm. Gallbladder is unremarkable. No intrahepatic or extrahepatic ductal dilatation. Pancreas: Within normal limits. Spleen: Within normal limits. Adrenals/Urinary Tract: Adrenal glands are within normal limits. Kidneys are within normal limits.  No hydronephrosis. Bladder is within normal limits. Stomach/Bowel: Stomach is notable for a tiny hiatal hernia. No evidence of bowel obstruction.  Appendix is not discretely visualized. Sigmoid diverticulosis, without evidence of diverticulitis. Vascular/Lymphatic: No evidence of abdominal aortic aneurysm. Atherosclerotic calcifications of the abdominal aorta and branch vessels. 3.2 cm node/soft tissue nodule at the esophageal hiatus (series 2/image 22), new. Reproductive: Status post prostatectomy. Other: Small volume abdominopelvic ascites, new, including mesenteric ascites along the  small bowel with tethering/mass effect (series 2/image 70), likely malignant. Additional frank peritoneal disease/omental caking beneath the anterior abdominal wall (series 2/image 49), new. Musculoskeletal: Mild degenerative changes of the lumbar spine. Review of the MIP images confirms the above findings. IMPRESSION: No evidence of pulmonary embolism. Progressive mesothelioma involving the left hemithorax, as above. Associated mediastinal nodal metastases and pericardial metastases, as above. Multifocal hepatic metastases, new/progressive. New peritoneal disease in the abdomen/pelvis with omental caking and abdominopelvic ascites. Additional upper abdominal nodal metastasis versus soft tissue nodule, as above. Additional ancillary findings as above. Electronically Signed   By: Julian Hy M.D.   On: 06/13/2021 22:31   US Paracentesis  Result Date: 06/25/2021 INDICATION: Metastatic mesothelioma. Recurrent ascites. Request for therapeutic paracentesis. EXAM: ULTRASOUND GUIDED LEFT LOWER QUADRANT PARACENTESIS MEDICATIONS: 1% plain lidocaine, 5 mL COMPLICATIONS: None immediate. PROCEDURE: Informed written consent was obtained from the patient after a discussion of the risks, benefits and alternatives to treatment. A timeout was performed prior to the initiation of the procedure. Initial ultrasound scanning demonstrates a small to moderate amount of ascites within the left lower abdominal quadrant. The left lower abdomen was prepped and draped in the usual sterile fashion. 1% lidocaine was used for local anesthesia. Following this, a 19 gauge, 7-cm, Yueh catheter was introduced. An ultrasound image was saved for documentation purposes. The paracentesis was performed. The catheter was removed and a dressing was applied. The patient tolerated the procedure well without immediate post procedural complication. FINDINGS: A total of approximately 600 mL of clear yellow fluid was removed. IMPRESSION: Successful  ultrasound-guided paracentesis yielding 600 mL of peritoneal fluid. Read by: Ascencion Dike PA-C PLAN: If the patient eventually requires >/=2 paracenteses in a 30 day period, candidacy for formal evaluation by the [facility] will be assessed. Electronically Signed   By: Aletta Edouard M.D.   On: 06/25/2021 11:40   DG Chest Port 1 View  Result Date: 06/13/2021 CLINICAL DATA:  Hypotension, history of mesothelioma EXAM: PORTABLE CHEST 1 VIEW COMPARISON:  11/26/2019, 03/28/2021 CT FINDINGS: Cardiac shadow is stable. Right lung is well aerated and clear. Left lung demonstrates patchy basilar infiltrate with mild left-sided effusion. No acute bony abnormality is noted. IMPRESSION: Left basilar infiltrate with associated effusion. These changes are likely related to the patient's known mesothelioma Electronically Signed   By: Inez Catalina M.D.   On: 06/13/2021 20:39     ASSESSMENT AND PLAN: This is a very pleasant 81 years old white male recently diagnosed with malignant pleural mesothelioma involving the left hemithorax. The patient is currently undergoing systemic chemotherapy with carboplatin for AUC of 5, Alimta 500 mg/M2 and Avastin 15 mg/KG every 3 weeks.  Status post 23 cycles.  Starting from cycle #7 the patient will be on maintenance treatment with Avastin 15 mg/KG every 3 weeks.  His maintenance treatment with Avastin was discontinued after cycle #23 in February 2023 secondary to diverticulitis as well as microperforations. Unfortunately the patient has significant evidence for disease progression on the recent imaging studies of the chest, abdomen and pelvis with increased and the disease in the left side of the chest as well as mediastinal and pericardial  tumor progression as well as new liver and abdominal metastasis with omental caking. The patient is started systemic chemotherapy again with carboplatin for AUC of 5 and Alimta 500 Mg/M2 status  post 1 cycle. He tolerated the first week of his treatment fairly well with no concerning adverse effect except for the fatigue. I recommended for him to continue his treatment as planned and he is expected to start cycle #2 in 2 weeks. For the fatigue he was advised to use Decadron 4 mg p.o. twice daily for few days if needed. For the ascites, he underwent ultrasound-guided paracentesis with drainage of 600 ml of peritoneal fluid.   We will repeat it on as-needed basis. For pain management I will give him refill of tramadol.  The patient voices understanding of current disease status and treatment options and is in agreement with the current care plan.  All questions were answered. The patient knows to call the clinic with any problems, questions or concerns. We can certainly see the patient much sooner if necessary.    Disclaimer: This note was dictated with voice recognition software. Similar sounding words can inadvertently be transcribed and may not be corrected upon review.

## 2021-07-14 ENCOUNTER — Other Ambulatory Visit: Payer: PPO

## 2021-07-14 ENCOUNTER — Inpatient Hospital Stay: Payer: PPO

## 2021-07-14 ENCOUNTER — Other Ambulatory Visit: Payer: Self-pay

## 2021-07-14 DIAGNOSIS — C457 Mesothelioma of other sites: Secondary | ICD-10-CM

## 2021-07-14 DIAGNOSIS — Z95828 Presence of other vascular implants and grafts: Secondary | ICD-10-CM

## 2021-07-14 LAB — CMP (CANCER CENTER ONLY)
ALT: 15 U/L (ref 0–44)
AST: 11 U/L — ABNORMAL LOW (ref 15–41)
Albumin: 3.5 g/dL (ref 3.5–5.0)
Alkaline Phosphatase: 65 U/L (ref 38–126)
Anion gap: 6 (ref 5–15)
BUN: 20 mg/dL (ref 8–23)
CO2: 31 mmol/L (ref 22–32)
Calcium: 9.1 mg/dL (ref 8.9–10.3)
Chloride: 97 mmol/L — ABNORMAL LOW (ref 98–111)
Creatinine: 0.79 mg/dL (ref 0.61–1.24)
GFR, Estimated: >60 mL/min (ref >60)
Glucose, Bld: 103 mg/dL — ABNORMAL HIGH (ref 70–99)
Potassium: 3.6 mmol/L (ref 3.5–5.1)
Sodium: 134 mmol/L — ABNORMAL LOW (ref 135–145)
Total Bilirubin: 0.3 mg/dL (ref 0.3–1.2)
Total Protein: 6.4 g/dL — ABNORMAL LOW (ref 6.5–8.1)

## 2021-07-14 LAB — CBC WITH DIFFERENTIAL (CANCER CENTER ONLY)
Abs Immature Granulocytes: 0.32 10*3/uL — ABNORMAL HIGH (ref 0.00–0.07)
Basophils Absolute: 0 10*3/uL (ref 0.0–0.1)
Basophils Relative: 0 %
Eosinophils Absolute: 0 10*3/uL (ref 0.0–0.5)
Eosinophils Relative: 1 %
HCT: 34.5 % — ABNORMAL LOW (ref 39.0–52.0)
Hemoglobin: 11.3 g/dL — ABNORMAL LOW (ref 13.0–17.0)
Immature Granulocytes: 5 %
Lymphocytes Relative: 24 %
Lymphs Abs: 1.7 10*3/uL (ref 0.7–4.0)
MCH: 28.4 pg (ref 26.0–34.0)
MCHC: 32.8 g/dL (ref 30.0–36.0)
MCV: 86.7 fL (ref 80.0–100.0)
Monocytes Absolute: 1 10*3/uL (ref 0.1–1.0)
Monocytes Relative: 14 %
Neutro Abs: 4 10*3/uL (ref 1.7–7.7)
Neutrophils Relative %: 56 %
Platelet Count: 153 10*3/uL (ref 150–400)
RBC: 3.98 MIL/uL — ABNORMAL LOW (ref 4.22–5.81)
RDW: 16.1 % — ABNORMAL HIGH (ref 11.5–15.5)
WBC Count: 7 10*3/uL (ref 4.0–10.5)
nRBC: 0 % (ref 0.0–0.2)

## 2021-07-14 MED ORDER — HEPARIN SOD (PORK) LOCK FLUSH 100 UNIT/ML IV SOLN
500.0000 [IU] | Freq: Once | INTRAVENOUS | Status: AC
  Administered 2021-07-14: 500 [IU]

## 2021-07-14 MED ORDER — SODIUM CHLORIDE 0.9% FLUSH
10.0000 mL | Freq: Once | INTRAVENOUS | Status: AC
  Administered 2021-07-14: 10 mL

## 2021-07-16 NOTE — Progress Notes (Signed)
[facility] OFFICE PROGRESS NOTE  [name], MD [place] [address]  DIAGNOSIS: Stage I/II malignant pleural mesothelioma involving the left hemithorax diagnosed in September 2021.  PRIOR THERAPY:  Systemic chemotherapy with carboplatin for AUC of 5, Alimta 500 mg/M2 and Avastin 15 mg/KG every 3 weeks.  Status post 23 cycles.  Starting from cycle #7 the patient will be on treatment with maintenance Avastin 15 mg/KG every 3 weeks.  His treatment with Avastin was discontinued secondary to suspicious GI perforation.  CURRENT THERAPY:  Systemic chemotherapy again with carboplatin for AUC of 5 and Alimta 500 Mg/M2 every 3 weeks.  First dose 06/30/2021.  Status post 1 cycle. Dose reduced of carboplatin to an AUC of 4 and Alimta 400 mg per metered squared starting from cycle #2 due to side effects.  INTERVAL HISTORY: Jack Haynes 81 y.o. male returns to the clinic today for a follow-up visit accompanied by his significant other.  In February 2023, the patient developed diverticulitis and microperforation.  There was concern that the patient's treatment with Avastin contributed to his GI perforation, therefore, his treatment was on hold for a few months from February-May 2023.  In the interval, the patient did develop some evidence of disease progression.  His performance status also declined in the interval.  He was restarted on chemotherapy with carboplatin and Alimta.  He status post 1 cycle and tolerated this fair except for some fatigue, weakness, and decreased appetite.  He has had some good days and bad days regarding fatigue but his energy has not recovered to where it was prior to chemotherapy.  He reports he felt good a few days after his first infusion until the steroids wore off.  He also had a paracentesis in the interval which yielded 600 mL of fluid.  He does not feel like he has abdominal distention or significant ascites at this time.  He denies  any recent fever, chills, or night sweats.  He continues to have hoarseness which has been present for approximately 6 weeks.  He reports his breathing has been "pretty good".  He has some shortness of breath with exertion.  He denies significant cough and will use cough drops if he has a mild cough.  Denies any hemoptysis or chest pain.   He tries to lay down flat, he has some left sided rib pain.  Therefore, the patient has been sleeping upright in a recliner.  He has been using some Tylenol.  He also has tramadol if needed.  He denies any hemoptysis.  Denies any abnormal bleeding or bruising.  Denies any nausea, vomiting, diarrhea, or constipation.  He does mention he has some aversions to certain smells, such as meats. The patient is here today for evaluation before considering starting cycle #2.    MEDICAL HISTORY: Past Medical History:  Diagnosis Date   Arthritis    Cancer ([place])    hx of prostatae cancer   Diverticulitis    Dyspnea    until chest tube placed   Hypertension    Mesothelioma ([place])     ALLERGIES:  has No Known Allergies.  MEDICATIONS:  Current Outpatient Medications  Medication Sig Dispense Refill   acetaminophen (TYLENOL) 500 MG tablet Take 2 tablets (1,000 mg total) by mouth every 6 (six) hours as needed for mild pain or fever. (Patient taking differently: Take 500 mg by mouth every 6 (six) hours as needed for mild pain or fever.) 30 tablet 0  aspirin EC 81 MG tablet Take 81 mg by mouth daily. Swallow whole.     calcium carbonate (TUMS - DOSED IN MG ELEMENTAL CALCIUM) 500 MG chewable tablet Chew 1,000 mg by mouth daily as needed for indigestion or heartburn.     Cetirizine HCl (ZYRTEC ALLERGY) 10 MG CAPS Zyrtec 10 mg capsule  Take by oral route.     dexamethasone (DECADRON) 4 MG tablet 1 tablet p.o. twice daily the day before, day of and day after chemotherapy every 3 weeks 40 tablet 1   ferrous sulfate 325 (65 FE) MG tablet Take 325 mg by mouth daily with breakfast.      fluticasone (FLONASE) 50 MCG/ACT nasal spray Place 1 spray into both nostrils daily. 16 mL 0   folic acid (FOLVITE) 1 MG tablet Take 1 tablet (1 mg total) by mouth daily. 30 tablet 4   JARDIANCE 10 MG TABS tablet TAKE 1 TABLET BY MOUTH DAILY BEFORE BREAKFAST. 90 tablet 1   lidocaine-prilocaine (EMLA) cream Apply to Port-A-Cath site 30-60-minute before treatment. 30 g 0   losartan-hydrochlorothiazide (HYZAAR) 100-12.5 MG tablet TAKE 1 TABLET BY MOUTH EVERY DAY 90 tablet 3   metFORMIN (GLUCOPHAGE) 500 MG tablet TAKE 1 TABLET BY MOUTH 2 TIMES DAILY WITH A MEAL. 180 tablet 3   ondansetron (ZOFRAN-ODT) 4 MG disintegrating tablet TAKE 1 TABLET (4 MG TOTAL) BY MOUTH EVERY 8 (EIGHT) HOURS AS NEEDED FOR NAUSEA OR VOMITING. STARTING 3 DAYS AFTER CHEMOTHERAPY 30 tablet 1   pantoprazole (PROTONIX) 40 MG tablet Take 1 tablet (40 mg total) by mouth daily. 30 tablet 0   prochlorperazine (COMPAZINE) 10 MG tablet Take 1 tablet (10 mg total) by mouth every 6 (six) hours as needed for nausea or vomiting. 30 tablet 0   psyllium (METAMUCIL) 58.6 % powder Take 1 packet by mouth 3 (three) times daily.     rosuvastatin (CRESTOR) 20 MG tablet TAKE 1 TABLET BY MOUTH EVERY DAY 90 tablet 3   senna (SENOKOT) 8.6 MG tablet Take 1 tablet by mouth daily.     simethicone (MYLICON) 371 MG chewable tablet Chew 125 mg by mouth daily as needed (gas).     traMADol (ULTRAM) 50 MG tablet Take 1 tablet (50 mg total) by mouth every 12 (twelve) hours as needed. 60 tablet 0   No current facility-administered medications for this visit.    SURGICAL HISTORY:  Past Surgical History:  Procedure Laterality Date   APPENDECTOMY     BACK SURGERY  1998   disectomy   CHEST TUBE INSERTION Left 11/13/2019   Procedure: INSERTION PLEURAL DRAINAGE CATHETER;  Surgeon: Grace Isaac, MD;  Location: [place];  Service: Thoracic;  Laterality: Left;   EYE SURGERY Left 1965   injury   IR [place]  05/27/2020   JOINT  REPLACEMENT Left 2010   left knee replacement   KNEE SURGERY     PLEURAL BIOPSY Left 11/13/2019   Procedure: PLEURAL BIOPSY;  Surgeon: Grace Isaac, MD;  Location: [facility];  Service: Thoracic;  Laterality: Left;   PORTACATH PLACEMENT Left 11/26/2019   Procedure: INSERTION PORT-A-CATH BARD POWERPORT 9.6 FR CATHETER;  Surgeon: Grace Isaac, MD;  Location: [place];  Service: Thoracic;  Laterality: Left;   PROSTATE SURGERY     REMOVAL OF PLEURAL DRAINAGE CATHETER Left 11/26/2019   Procedure: REMOVAL OF PLEURAL DRAINAGE CATHETER;  Surgeon: Grace Isaac, MD;  Location: [facility];  Service: Thoracic;  Laterality: Left;   SPINE SURGERY  TALC PLEURODESIS Left 11/13/2019   Procedure: possible TALC PLEURADESIS;  Surgeon: Grace Isaac, MD;  Location: [facility];  Service: Thoracic;  Laterality: Left;   TOTAL KNEE ARTHROPLASTY  02/01/2012   Procedure: TOTAL KNEE ARTHROPLASTY;  Surgeon: Sydnee Cabal, MD;  Location: [facility];  Service: Orthopedics;  Laterality: Left;   VIDEO ASSISTED THORACOSCOPY Left 11/13/2019   Procedure: VIDEO ASSISTED THORACOSCOPY;  Surgeon: Grace Isaac, MD;  Location: [facility];  Service: Thoracic;  Laterality: Left;   VIDEO BRONCHOSCOPY N/A 11/13/2019   Procedure: VIDEO BRONCHOSCOPY;  Surgeon: Grace Isaac, MD;  Location: [facility];  Service: Thoracic;  Laterality: N/A;    REVIEW OF SYSTEMS:   Review of Systems  Constitutional: Positive for fatigue and decreased appetite.  Negative for chills and fever and unexpected weight change.  HENT: Positive for hoarseness for 6 weeks.  Negative for mouth sores, nosebleeds, sore throat and trouble swallowing.   Eyes: Negative for eye problems and icterus.  Respiratory: Positive for dyspnea on exertion and mild/minimal cough.  Negative for hemoptysis and wheezing.   Cardiovascular: Positive for left-sided rib discomfort.  Negative for  leg swelling.  Gastrointestinal: Negative for abdominal pain, constipation, diarrhea, nausea  and vomiting.  Genitourinary: Negative for bladder incontinence, difficulty urinating, dysuria, frequency and hematuria.   Musculoskeletal: Negative for back pain, gait problem, neck pain and neck stiffness.  Skin: Negative for itching and rash.  Neurological: Negative for dizziness, extremity weakness, gait problem, headaches, light-headedness and seizures.  Hematological: Negative for adenopathy. Does not bruise/bleed easily.  Psychiatric/Behavioral: Negative for confusion, depression and sleep disturbance. The patient is not nervous/anxious.     PHYSICAL EXAMINATION:  Blood pressure (!) 132/91, pulse 88, temperature (!) 97.2 F (36.2 C), temperature source Tympanic, resp. rate 17, weight 181 lb 3.2 oz (82.2 kg), SpO2 94 %.  ECOG PERFORMANCE STATUS: 1  Physical Exam  Constitutional: Oriented to person, place, and time and well-developed, well-nourished, and in no distress.  HENT:  Head: Normocephalic and atraumatic.  Mouth/Throat: Positive for hoarseness.  Oropharynx is clear and moist. No oropharyngeal exudate.  Eyes: Conjunctivae are normal. Right eye exhibits no discharge. Left eye exhibits no discharge. No scleral icterus.  Neck: Normal range of motion. Neck supple.  Cardiovascular: Normal rate, regular rhythm, normal heart sounds and intact distal pulses.   Pulmonary/Chest: Effort normal.  Quiet breath sounds in the left lung.  No respiratory distress. No wheezes. No rales.  Abdominal: Soft. Bowel sounds are normal. Exhibits no distension and no mass. There is no tenderness.  Musculoskeletal: Normal range of motion. Exhibits no edema.  Lymphadenopathy:    No cervical adenopathy.  Neurological: Alert and oriented to person, place, and time. Exhibits normal muscle tone. Gait normal. Coordination normal.  Skin: Skin is warm and dry. No rash noted. Not diaphoretic. No erythema. No pallor.  Psychiatric: Mood, memory and judgment normal.  Vitals reviewed.  LABORATORY DATA: Lab  Results  Component Value Date   WBC 8.5 07/21/2021   HGB 10.5 (L) 07/21/2021   HCT 32.5 (L) 07/21/2021   MCV 87.4 07/21/2021   PLT 203 07/21/2021      Chemistry      Component Value Date/Time   NA 134 (L) 07/14/2021 1331   NA 141 09/24/2019 0832   K 3.6 07/14/2021 1331   CL 97 (L) 07/14/2021 1331   CO2 31 07/14/2021 1331   BUN 20 07/14/2021 1331   BUN 17 09/24/2019 0832   CREATININE 0.79 07/14/2021 1331   CREATININE 1.01  12/24/2014 0916      Component Value Date/Time   CALCIUM 9.1 07/14/2021 1331   ALKPHOS 65 07/14/2021 1331   AST 11 (L) 07/14/2021 1331   ALT 15 07/14/2021 1331   BILITOT 0.3 07/14/2021 1331       RADIOGRAPHIC STUDIES:  US Paracentesis  Result Date: 06/25/2021 INDICATION: Metastatic mesothelioma. Recurrent ascites. Request for therapeutic paracentesis. EXAM: ULTRASOUND GUIDED LEFT LOWER QUADRANT PARACENTESIS MEDICATIONS: 1% plain lidocaine, 5 mL COMPLICATIONS: None immediate. PROCEDURE: Informed written consent was obtained from the patient after a discussion of the risks, benefits and alternatives to treatment. A timeout was performed prior to the initiation of the procedure. Initial ultrasound scanning demonstrates a small to moderate amount of ascites within the left lower abdominal quadrant. The left lower abdomen was prepped and draped in the usual sterile fashion. 1% lidocaine was used for local anesthesia. Following this, a 19 gauge, 7-cm, Yueh catheter was introduced. An ultrasound image was saved for documentation purposes. The paracentesis was performed. The catheter was removed and a dressing was applied. The patient tolerated the procedure well without immediate post procedural complication. FINDINGS: A total of approximately 600 mL of clear yellow fluid was removed. IMPRESSION: Successful ultrasound-guided paracentesis yielding 600 mL of peritoneal fluid. Read by: Ascencion Dike PA-C PLAN: If the patient eventually requires >/=2 paracenteses in a 30 day  period, candidacy for formal evaluation by the [facility] will be assessed. Electronically Signed   By: Aletta Edouard M.D.   On: 06/25/2021 11:40     ASSESSMENT/PLAN:  This is a very pleasant 81 year old Caucasian male diagnosed with malignant pleural mesothelioma involving the left hemithorax.  The patient was undergoing treatment with carboplatin for an AUC of 5, Alimta 500 mg per metered square, and Avastin 15 mg/kg IV every 3 weeks.  He was status post 23 cycles.  Starting from cycle #7 the patient was on maintenance treatment with Avastin 15 mg/kg every 3 weeks.  This was discontinued after cycle #23 due to diverticulitis and microperforations.  Unfortunately, the patient was found to have evidence of disease progression on imaging studies from 06/13/21.  Imaging showed increase in the left side of the chest as well as mediastinal and pericardial tumor progression as well as new liver and abdominal metastases with omental caking.  Dr. Julien Nordmann discussed the options. The patient decided to resume systemic chemotherapy with carboplatin for an AUC of 5 and Alimta 500 mg per metered squared IV every 3 weeks.  He is status post 1 cycle.  He tolerated it fair except have some fatigue and weakness and has not quite recovered to his baseline.  Starting from cycle #2, Dr. Julien Nordmann is going to reduce his dose of carboplatin to an AUC of 4 and Alimta to 400 mg per metered squared to see if this helps with tolerability.  If no benefit from systemic chemotherapy, Dr. Julien Nordmann may consider him for immunotherapy with ipilimumab and nivolumab first palliative care/hospice care depending on his performance status at that time.  The patient was seen with Dr. Julien Nordmann today.  Labs were reviewed.  Recommend that he proceed with cycle #2 today scheduled.  We will see him back for follow-up visit in 3 weeks for evaluation and repeat blood work before starting cycle  #3.  No significant ascites at this time.  We will continue to monitor.  Regarding the decreased appetite, the patient is expected to see a member the nutritionist team while in the infusion room today.  We reviewed foods that tend to taste better and worse while on chemotherapy.  Advised him to change up his diet.  Advised him it may be helpful to take his antiemetic prior to trying to eat to prevent smell/taste aversions to food/nausea.   The patient has some left-sided rib pain when he lies on his left side.  Advised the patient he can use Tylenol.  In particular he can use Tylenol PM at night to help with the discomfort as well as help him sleep.  Also discussed he can use topical Salonpas or Lidoderm patches.  Can also use heating pads if needed.  For times with more significant discomfort, he can take tramadol.  Of note, the patient tries to avoid taking tramadol unless absolutely needed.  Dr. Julien Nordmann also discussed with the patient may extend out his Decadron premedication by an additional 2 to 3 days for fatigue.   The patient was advised to call immediately if she has any concerning symptoms in the interval. The patient voices understanding of current disease status and treatment options and is in agreement with the current care plan. All questions were answered. The patient knows to call the clinic with any problems, questions or concerns. We can certainly see the patient much sooner if necessary      No orders of the defined types were placed in this encounter.    Izaan Kingbird L Phu Record, PA-C 07/21/21  ADDENDUM: Hematology/Oncology Attending: I had a face-to-face encounter with the patient today.  I reviewed his record, lab and recommended his care plan.  This is a very pleasant 81 years old white male diagnosed with malignant pleural mesothelioma involving the left hemothorax is status post previous treatment with carboplatin, Alimta and Avastin for 23 cycles and the patient  took a break for few months because of fatigue and weakness as well as diverticulitis with microperforation.  Unfortunately he developed disease progression after taking the break of the treatment and he started systemic chemotherapy again with carboplatin and Alimta.  He is status post 1 cycle and tolerated it well except for the fatigue and weakness. I recommended for the patient to proceed with cycle #2 but I will reduce the dose of carboplatin to AUC of 4 and Alimta to 400 Mg/M2 starting from cycle #2. The patient will come back for follow-up visit in 3 weeks for evaluation before the next cycle of his treatment. He was advised to call immediately if he has any other concerning symptoms in the interval. The total time spent in the appointment was 30 minutes.  Disclaimer: This note was dictated with voice recognition software. Similar sounding words can inadvertently be transcribed and may be missed upon review. Eilleen Kempf, MD 07/21/21

## 2021-07-17 ENCOUNTER — Other Ambulatory Visit: Payer: Self-pay

## 2021-07-17 DIAGNOSIS — C457 Mesothelioma of other sites: Secondary | ICD-10-CM

## 2021-07-20 MED FILL — Fosaprepitant Dimeglumine For IV Infusion 150 MG (Base Eq): INTRAVENOUS | Qty: 5 | Status: AC

## 2021-07-20 MED FILL — Dexamethasone Sodium Phosphate Inj 100 MG/10ML: INTRAMUSCULAR | Qty: 1 | Status: AC

## 2021-07-21 ENCOUNTER — Inpatient Hospital Stay: Payer: PPO | Admitting: Dietician

## 2021-07-21 ENCOUNTER — Inpatient Hospital Stay (HOSPITAL_BASED_OUTPATIENT_CLINIC_OR_DEPARTMENT_OTHER): Payer: PPO | Admitting: Physician Assistant

## 2021-07-21 ENCOUNTER — Inpatient Hospital Stay: Payer: PPO

## 2021-07-21 ENCOUNTER — Other Ambulatory Visit: Payer: PPO

## 2021-07-21 ENCOUNTER — Other Ambulatory Visit: Payer: Self-pay

## 2021-07-21 ENCOUNTER — Inpatient Hospital Stay: Payer: PPO | Attending: Internal Medicine | Admitting: Physician Assistant

## 2021-07-21 VITALS — BP 132/91 | HR 88 | Temp 97.2°F | Resp 17 | Wt 181.2 lb

## 2021-07-21 VITALS — BP 134/79 | HR 98 | Resp 20

## 2021-07-21 DIAGNOSIS — C771 Secondary and unspecified malignant neoplasm of intrathoracic lymph nodes: Secondary | ICD-10-CM | POA: Diagnosis not present

## 2021-07-21 DIAGNOSIS — T50905A Adverse effect of unspecified drugs, medicaments and biological substances, initial encounter: Secondary | ICD-10-CM

## 2021-07-21 DIAGNOSIS — Z5111 Encounter for antineoplastic chemotherapy: Secondary | ICD-10-CM | POA: Diagnosis not present

## 2021-07-21 DIAGNOSIS — C7989 Secondary malignant neoplasm of other specified sites: Secondary | ICD-10-CM | POA: Diagnosis not present

## 2021-07-21 DIAGNOSIS — C457 Mesothelioma of other sites: Secondary | ICD-10-CM

## 2021-07-21 DIAGNOSIS — C450 Mesothelioma of pleura: Secondary | ICD-10-CM | POA: Insufficient documentation

## 2021-07-21 DIAGNOSIS — C787 Secondary malignant neoplasm of liver and intrahepatic bile duct: Secondary | ICD-10-CM | POA: Insufficient documentation

## 2021-07-21 DIAGNOSIS — Z95828 Presence of other vascular implants and grafts: Secondary | ICD-10-CM

## 2021-07-21 LAB — CMP (CANCER CENTER ONLY)
ALT: 27 U/L (ref 0–44)
AST: 16 U/L (ref 15–41)
Albumin: 2.9 g/dL — ABNORMAL LOW (ref 3.5–5.0)
Alkaline Phosphatase: 66 U/L (ref 38–126)
Anion gap: 9 (ref 5–15)
BUN: 24 mg/dL — ABNORMAL HIGH (ref 8–23)
CO2: 26 mmol/L (ref 22–32)
Calcium: 8.6 mg/dL — ABNORMAL LOW (ref 8.9–10.3)
Chloride: 98 mmol/L (ref 98–111)
Creatinine: 0.82 mg/dL (ref 0.61–1.24)
GFR, Estimated: >60 mL/min (ref >60)
Glucose, Bld: 231 mg/dL — ABNORMAL HIGH (ref 70–99)
Potassium: 3.8 mmol/L (ref 3.5–5.1)
Sodium: 133 mmol/L — ABNORMAL LOW (ref 135–145)
Total Bilirubin: 0.4 mg/dL (ref 0.3–1.2)
Total Protein: 6.3 g/dL — ABNORMAL LOW (ref 6.5–8.1)

## 2021-07-21 LAB — CBC WITH DIFFERENTIAL (CANCER CENTER ONLY)
Abs Immature Granulocytes: 0.79 10*3/uL — ABNORMAL HIGH (ref 0.00–0.07)
Basophils Absolute: 0 10*3/uL (ref 0.0–0.1)
Basophils Relative: 0 %
Eosinophils Absolute: 0 10*3/uL (ref 0.0–0.5)
Eosinophils Relative: 0 %
HCT: 32.5 % — ABNORMAL LOW (ref 39.0–52.0)
Hemoglobin: 10.5 g/dL — ABNORMAL LOW (ref 13.0–17.0)
Immature Granulocytes: 9 %
Lymphocytes Relative: 9 %
Lymphs Abs: 0.8 10*3/uL (ref 0.7–4.0)
MCH: 28.2 pg (ref 26.0–34.0)
MCHC: 32.3 g/dL (ref 30.0–36.0)
MCV: 87.4 fL (ref 80.0–100.0)
Monocytes Absolute: 0.7 10*3/uL (ref 0.1–1.0)
Monocytes Relative: 8 %
Neutro Abs: 6.3 10*3/uL (ref 1.7–7.7)
Neutrophils Relative %: 74 %
Platelet Count: 203 10*3/uL (ref 150–400)
RBC: 3.72 MIL/uL — ABNORMAL LOW (ref 4.22–5.81)
RDW: 16.8 % — ABNORMAL HIGH (ref 11.5–15.5)
Smear Review: NORMAL
WBC Count: 8.5 10*3/uL (ref 4.0–10.5)
nRBC: 0 % (ref 0.0–0.2)

## 2021-07-21 MED ORDER — SODIUM CHLORIDE 0.9% FLUSH
10.0000 mL | Freq: Once | INTRAVENOUS | Status: AC
  Administered 2021-07-21: 10 mL

## 2021-07-21 MED ORDER — METHYLPREDNISOLONE SODIUM SUCC 125 MG IJ SOLR
125.0000 mg | Freq: Once | INTRAMUSCULAR | Status: AC | PRN
  Administered 2021-07-21: 125 mg via INTRAVENOUS

## 2021-07-21 MED ORDER — DIPHENHYDRAMINE HCL 50 MG/ML IJ SOLN
50.0000 mg | Freq: Once | INTRAMUSCULAR | Status: AC | PRN
  Administered 2021-07-21: 25 mg via INTRAVENOUS

## 2021-07-21 MED ORDER — PALONOSETRON HCL INJECTION 0.25 MG/5ML
0.2500 mg | Freq: Once | INTRAVENOUS | Status: AC
  Administered 2021-07-21: 0.25 mg via INTRAVENOUS
  Filled 2021-07-21: qty 5

## 2021-07-21 MED ORDER — SODIUM CHLORIDE 0.9 % IV SOLN
150.0000 mg | Freq: Once | INTRAVENOUS | Status: AC
  Administered 2021-07-21: 150 mg via INTRAVENOUS
  Filled 2021-07-21: qty 150

## 2021-07-21 MED ORDER — SODIUM CHLORIDE 0.9 % IV SOLN
382.4000 mg | Freq: Once | INTRAVENOUS | Status: AC
  Administered 2021-07-21: 380 mg via INTRAVENOUS
  Filled 2021-07-21: qty 38

## 2021-07-21 MED ORDER — SODIUM CHLORIDE 0.9 % IV SOLN: Freq: Once | INTRAVENOUS | Status: AC

## 2021-07-21 MED ORDER — HEPARIN SOD (PORK) LOCK FLUSH 100 UNIT/ML IV SOLN
500.0000 [IU] | Freq: Once | INTRAVENOUS | Status: AC | PRN
  Administered 2021-07-21: 500 [IU]

## 2021-07-21 MED ORDER — FAMOTIDINE IN NACL 20-0.9 MG/50ML-% IV SOLN
20.0000 mg | Freq: Once | INTRAVENOUS | Status: AC | PRN
  Administered 2021-07-21: 20 mg via INTRAVENOUS

## 2021-07-21 MED ORDER — FAMOTIDINE IN NACL 20-0.9 MG/50ML-% IV SOLN
20.0000 mg | Freq: Once | INTRAVENOUS | Status: AC
  Administered 2021-07-21: 20 mg via INTRAVENOUS
  Filled 2021-07-21: qty 50

## 2021-07-21 MED ORDER — SODIUM CHLORIDE 0.9% FLUSH
10.0000 mL | INTRAVENOUS | Status: DC | PRN
  Administered 2021-07-21: 10 mL

## 2021-07-21 MED ORDER — SODIUM CHLORIDE 0.9 % IV SOLN
10.0000 mg | Freq: Once | INTRAVENOUS | Status: AC
  Administered 2021-07-21: 10 mg via INTRAVENOUS
  Filled 2021-07-21: qty 10

## 2021-07-21 MED ORDER — DIPHENHYDRAMINE HCL 50 MG/ML IJ SOLN
25.0000 mg | Freq: Once | INTRAMUSCULAR | Status: AC
  Administered 2021-07-21: 25 mg via INTRAVENOUS
  Filled 2021-07-21: qty 1

## 2021-07-21 MED ORDER — SODIUM CHLORIDE 0.9 % IV SOLN
400.0000 mg/m2 | Freq: Once | INTRAVENOUS | Status: AC
  Administered 2021-07-21: 800 mg via INTRAVENOUS
  Filled 2021-07-21: qty 20

## 2021-07-21 NOTE — Progress Notes (Signed)
Nutrition Follow-up:  Patient with stage I/II malignant pleural mesothelioma involving left hemithorax. Patient on treatment break February-May s/p diverticulitis with microperforation in February. He is receiving systemic chemotherapy again with carboplatin and Alimta q3w (first dose 5/16).  Met with patient in infusion. He reports first chemotherapy "knocked him down a bit" but "things have been getting much better." Patient reports appetite "is great." He is no longer eating meat. Patient reports he much rather eat food out of can than have his wife prepare big meals. Patient is eating "a lot" of eggs, beans, and fruit. He is supplementing with Ensure milk shakes (made with chocolate ice cream) most every night. Patient drinking tea, water, and body armor. He denies nausea, vomiting, diarrhea. He takes miralax as needed for constipation.    Medications: protonix, zofran-odt, tramadol, decadron, folic acid  Labs: Na 215, Glucose 231, BUN 24  Anthropometrics: Weight 181 lb 3.2 oz today decreased  5/23 - 183 lb 1.6 oz  5/09 - 186 lb 11.2 oz  4/29 - 180 lb  3/15 - 189 lb  2/16 - 190 lb 1.6 oz    NUTRITION DIAGNOSIS: Food and nutrition knowledge related deficit ongoing    INTERVENTION:  Educated on foods with protein, discussed including protein source with all meals/snacks - handout with list of foods provided  Continue drinking Ensure Shakes, recommend additional Ensure Plus/equivalent daily for added calories and protein - coupons given Shake recipes given     MONITORING, EVALUATION, GOAL: weight trends, intake   NEXT VISIT: Tuesday July 18 during infusion

## 2021-07-21 NOTE — Progress Notes (Signed)
Hypersensitivity Reaction note  Date of event: 07/21/21 Time of event: 1112 Generic name of drug involved: Carboplatin Name of provider notified of the hypersensitivity reaction: Anda Kraft, [place] and Dr Julien Nordmann Was agent that likely caused hypersensitivity reaction added to Allergies List within EMR? yes Chain of events including reaction signs/symptoms, treatment administered, and outcome (e.g., drug resumed; drug discontinued; sent to Emergency Department; etc.) About 10 min into carboplatin infusion pt reported feeling nauseous. Carboplatin was stopped. As RN was assessing how pt was feeling (pt had no c/o SOB, chest pain, back pain) Pt went unresponsive for about 5 seconds and began gurgling. 1L NS started. Pt was sternal rubbed, Solumedrol, and pepcid given, placed on 2L of O2 [place]. Pt was responsive and stated he needed to use the restroom. Anda Kraft, PA to chairside. Pt used bedside commode. See flowsheet for vitals. Pt reported needing to remain on bedside commode, began sounding like he was trying to clear his throat. Stated it felt difficult to swallow. Additional 25 mg of Benadryl given. Pt moved back to chair with NS still infusing. Slowly began stating he was feeling closer to his baseline. VSS. 2L of O2 stopped, VS remained stable. Anda Kraft, PA back to chairside to reassess. Pt stated he was feeling much better and back to baseline. [place] for discharge per [place], [place]. Pt deaccessed and wheeled to lobby.   Charleston Poot, RN 07/21/2021 12:43 PM

## 2021-07-21 NOTE — Patient Instructions (Signed)
[facility]  Discharge Instructions: Thank you for choosing [facility] to provide your oncology and hematology care.   If you have a lab appointment with the [facility], please go directly to the [facility] and check in at the registration area.   Wear comfortable clothing and clothing appropriate for easy access to any Portacath or PICC line.   We strive to give you quality time with your provider. You may need to reschedule your appointment if you arrive late (15 or more minutes).  Arriving late affects you and other patients whose appointments are after yours.  Also, if you miss three or more appointments without notifying the office, you may be dismissed from the clinic at the provider's discretion.      For prescription refill requests, have your pharmacy contact our office and allow 72 hours for refills to be completed.    Today you received the following chemotherapy and/or immunotherapy agents: Carboplatin and Alimta      To help prevent nausea and vomiting after your treatment, we encourage you to take your nausea medication as directed.  BELOW ARE SYMPTOMS THAT SHOULD BE REPORTED IMMEDIATELY: *FEVER GREATER THAN 100.4 F (38 C) OR HIGHER *CHILLS OR SWEATING *NAUSEA AND VOMITING THAT IS NOT CONTROLLED WITH YOUR NAUSEA MEDICATION *UNUSUAL SHORTNESS OF BREATH *UNUSUAL BRUISING OR BLEEDING *URINARY PROBLEMS (pain or burning when urinating, or frequent urination) *BOWEL PROBLEMS (unusual diarrhea, constipation, pain near the anus) TENDERNESS IN MOUTH AND THROAT WITH OR WITHOUT PRESENCE OF ULCERS (sore throat, sores in mouth, or a toothache) UNUSUAL RASH, SWELLING OR PAIN  UNUSUAL VAGINAL DISCHARGE OR ITCHING   Items with * indicate a potential emergency and should be followed up as soon as possible or go to the Emergency Department if any problems should occur.  Please show the CHEMOTHERAPY ALERT CARD or IMMUNOTHERAPY ALERT CARD at  check-in to the Emergency Department and triage nurse.  Should you have questions after your visit or need to cancel or reschedule your appointment, please contact [facility]  Dept: [phone]  and follow the prompts.  Office hours are 8:00 a.m. to 4:30 p.m. Monday - Friday. Please note that voicemails left after 4:00 p.m. may not be returned until the following business day.  We are closed weekends and major holidays. You have access to a nurse at all times for urgent questions. Please call the main number to the clinic Dept: [phone] and follow the prompts.   For any non-urgent questions, you may also contact your provider using MyChart. We now offer e-Visits for anyone 69 and older to request care online for non-urgent symptoms. For details visit [email or web address].   Also download the MyChart app! Go to the app store, search "MyChart", open the app, select [facility], and log in with your MyChart username and password.  Due to Covid, a mask is required upon entering the hospital/clinic. If you do not have a mask, one will be given to you upon arrival. For doctor visits, patients may have 1 support person aged 72 or older with them. For treatment visits, patients cannot have anyone with them due to current Covid guidelines and our immunocompromised population.

## 2021-07-21 NOTE — Progress Notes (Signed)
    DATE:  07/21/21                                        X CHEMO/IMMUNOTHERAPY REACTION         MD: Julien Nordmann   AGENT/BLOOD PRODUCT RECEIVING TODAY:              Carboplatin and Pemetrexed   AGENT/BLOOD PRODUCT RECEIVING IMMEDIATELY PRIOR TO REACTION:          Carboplatin   VS: BP:     140/95   P:       103       SPO2:       98% on 2L                BP:     134/79   P:       99       SPO2:       95% RA     REACTION(S):           nausea, LOC   PREMEDS:     Decadron 10 mg IV,  emend 150 mg IV, aloxi 0.25 mg IV   INTERVENTION: Solumedrol 125 mg IV, benadryl 25 mg IV, pepcid 20 mg, IVF   Review of Systems  Review of Systems  Gastrointestinal:  Positive for nausea.  Neurological:  Positive for syncope.  All other systems reviewed and are negative.   Physical Exam  Physical Exam Vitals and nursing note reviewed.  Constitutional:      Appearance: He is well-developed. He is ill-appearing. He is not toxic-appearing.  HENT:     Head: Normocephalic and atraumatic.     Nose: Nose normal.  Eyes:     General: No scleral icterus.       Right eye: No discharge.        Left eye: No discharge.     Conjunctiva/sclera: Conjunctivae normal.  Neck:     Vascular: No JVD.  Cardiovascular:     Rate and Rhythm: Regular rhythm. Tachycardia present.     Pulses: Normal pulses.     Heart sounds: Normal heart sounds.  Pulmonary:     Effort: Pulmonary effort is normal. No respiratory distress.     Breath sounds: Normal breath sounds. No stridor. No wheezing, rhonchi or rales.  Chest:     Chest wall: No tenderness.  Abdominal:     General: There is no distension.  Musculoskeletal:        General: Normal range of motion.     Cervical back: Normal range of motion.  Skin:    General: Skin is warm and dry.     Findings: No rash.  Neurological:     Mental Status: He is oriented to person, place, and time.     GCS: GCS eye subscore is 4. GCS verbal subscore is 5. GCS motor subscore is 6.      Comments: Fluent speech, no facial droop.  Psychiatric:        Behavior: Behavior normal.    OUTCOME:       Patient returned to baseline after interventions above. Discussed with oncologist Dr. Julien Nordmann who agrees with plan to discontinue carboplatin as reaction was severe. Patient stable for discharge home.

## 2021-07-25 ENCOUNTER — Other Ambulatory Visit: Payer: Self-pay | Admitting: Emergency Medicine

## 2021-07-27 ENCOUNTER — Other Ambulatory Visit: Payer: Self-pay | Admitting: Internal Medicine

## 2021-07-27 DIAGNOSIS — C450 Mesothelioma of pleura: Secondary | ICD-10-CM

## 2021-07-28 ENCOUNTER — Other Ambulatory Visit: Payer: Self-pay

## 2021-07-28 ENCOUNTER — Inpatient Hospital Stay: Payer: PPO

## 2021-07-28 ENCOUNTER — Other Ambulatory Visit: Payer: PPO

## 2021-07-28 DIAGNOSIS — Z95828 Presence of other vascular implants and grafts: Secondary | ICD-10-CM

## 2021-07-28 DIAGNOSIS — Z5111 Encounter for antineoplastic chemotherapy: Secondary | ICD-10-CM | POA: Diagnosis not present

## 2021-07-28 DIAGNOSIS — C457 Mesothelioma of other sites: Secondary | ICD-10-CM

## 2021-07-28 LAB — CMP (CANCER CENTER ONLY)
ALT: 21 U/L (ref 0–44)
AST: 12 U/L — ABNORMAL LOW (ref 15–41)
Albumin: 3.4 g/dL — ABNORMAL LOW (ref 3.5–5.0)
Alkaline Phosphatase: 60 U/L (ref 38–126)
Anion gap: 6 (ref 5–15)
BUN: 22 mg/dL (ref 8–23)
CO2: 29 mmol/L (ref 22–32)
Calcium: 8.5 mg/dL — ABNORMAL LOW (ref 8.9–10.3)
Chloride: 100 mmol/L (ref 98–111)
Creatinine: 0.92 mg/dL (ref 0.61–1.24)
GFR, Estimated: >60 mL/min (ref >60)
Glucose, Bld: 212 mg/dL — ABNORMAL HIGH (ref 70–99)
Potassium: 4.2 mmol/L (ref 3.5–5.1)
Sodium: 135 mmol/L (ref 135–145)
Total Bilirubin: 0.3 mg/dL (ref 0.3–1.2)
Total Protein: 6.1 g/dL — ABNORMAL LOW (ref 6.5–8.1)

## 2021-07-28 LAB — CBC WITH DIFFERENTIAL (CANCER CENTER ONLY)
Abs Immature Granulocytes: 0.28 10*3/uL — ABNORMAL HIGH (ref 0.00–0.07)
Basophils Absolute: 0 10*3/uL (ref 0.0–0.1)
Basophils Relative: 0 %
Eosinophils Absolute: 0 10*3/uL (ref 0.0–0.5)
Eosinophils Relative: 0 %
HCT: 32.1 % — ABNORMAL LOW (ref 39.0–52.0)
Hemoglobin: 10.5 g/dL — ABNORMAL LOW (ref 13.0–17.0)
Immature Granulocytes: 8 %
Lymphocytes Relative: 20 %
Lymphs Abs: 0.7 10*3/uL (ref 0.7–4.0)
MCH: 28.3 pg (ref 26.0–34.0)
MCHC: 32.7 g/dL (ref 30.0–36.0)
MCV: 86.5 fL (ref 80.0–100.0)
Monocytes Absolute: 0.4 10*3/uL (ref 0.1–1.0)
Monocytes Relative: 10 %
Neutro Abs: 2.3 10*3/uL (ref 1.7–7.7)
Neutrophils Relative %: 62 %
Platelet Count: 81 10*3/uL — ABNORMAL LOW (ref 150–400)
RBC: 3.71 MIL/uL — ABNORMAL LOW (ref 4.22–5.81)
RDW: 16.3 % — ABNORMAL HIGH (ref 11.5–15.5)
Smear Review: NORMAL
WBC Count: 3.6 10*3/uL — ABNORMAL LOW (ref 4.0–10.5)
nRBC: 0 % (ref 0.0–0.2)

## 2021-07-28 MED ORDER — SODIUM CHLORIDE 0.9% FLUSH
10.0000 mL | Freq: Once | INTRAVENOUS | Status: AC
  Administered 2021-07-28: 10 mL

## 2021-07-28 MED ORDER — HEPARIN SOD (PORK) LOCK FLUSH 100 UNIT/ML IV SOLN
500.0000 [IU] | Freq: Once | INTRAVENOUS | Status: AC
  Administered 2021-07-28: 500 [IU]

## 2021-08-03 ENCOUNTER — Other Ambulatory Visit: Payer: Self-pay | Admitting: Physician Assistant

## 2021-08-03 ENCOUNTER — Encounter (HOSPITAL_BASED_OUTPATIENT_CLINIC_OR_DEPARTMENT_OTHER): Payer: Self-pay

## 2021-08-03 ENCOUNTER — Emergency Department (HOSPITAL_BASED_OUTPATIENT_CLINIC_OR_DEPARTMENT_OTHER): Payer: PPO | Admitting: Radiology

## 2021-08-03 ENCOUNTER — Emergency Department (HOSPITAL_COMMUNITY): Payer: PPO

## 2021-08-03 ENCOUNTER — Other Ambulatory Visit: Payer: Self-pay

## 2021-08-03 ENCOUNTER — Observation Stay (HOSPITAL_COMMUNITY): Payer: PPO

## 2021-08-03 ENCOUNTER — Inpatient Hospital Stay (HOSPITAL_BASED_OUTPATIENT_CLINIC_OR_DEPARTMENT_OTHER)
Admission: EM | Admit: 2021-08-03 | Discharge: 2021-08-15 | DRG: 374 | Disposition: E | Payer: PPO | Attending: Internal Medicine | Admitting: Internal Medicine

## 2021-08-03 DIAGNOSIS — M1990 Unspecified osteoarthritis, unspecified site: Secondary | ICD-10-CM | POA: Diagnosis present

## 2021-08-03 DIAGNOSIS — J90 Pleural effusion, not elsewhere classified: Secondary | ICD-10-CM | POA: Diagnosis present

## 2021-08-03 DIAGNOSIS — Z515 Encounter for palliative care: Secondary | ICD-10-CM | POA: Diagnosis not present

## 2021-08-03 DIAGNOSIS — K5900 Constipation, unspecified: Secondary | ICD-10-CM | POA: Diagnosis present

## 2021-08-03 DIAGNOSIS — R109 Unspecified abdominal pain: Secondary | ICD-10-CM | POA: Diagnosis present

## 2021-08-03 DIAGNOSIS — K7200 Acute and subacute hepatic failure without coma: Secondary | ICD-10-CM | POA: Diagnosis not present

## 2021-08-03 DIAGNOSIS — C7972 Secondary malignant neoplasm of left adrenal gland: Secondary | ICD-10-CM | POA: Diagnosis present

## 2021-08-03 DIAGNOSIS — D696 Thrombocytopenia, unspecified: Secondary | ICD-10-CM

## 2021-08-03 DIAGNOSIS — J9811 Atelectasis: Secondary | ICD-10-CM | POA: Diagnosis not present

## 2021-08-03 DIAGNOSIS — R1084 Generalized abdominal pain: Secondary | ICD-10-CM

## 2021-08-03 DIAGNOSIS — G8314 Monoplegia of lower limb affecting left nondominant side: Secondary | ICD-10-CM | POA: Diagnosis present

## 2021-08-03 DIAGNOSIS — Z4682 Encounter for fitting and adjustment of non-vascular catheter: Secondary | ICD-10-CM | POA: Diagnosis not present

## 2021-08-03 DIAGNOSIS — R188 Other ascites: Secondary | ICD-10-CM | POA: Diagnosis present

## 2021-08-03 DIAGNOSIS — K5730 Diverticulosis of large intestine without perforation or abscess without bleeding: Secondary | ICD-10-CM | POA: Diagnosis present

## 2021-08-03 DIAGNOSIS — Z95828 Presence of other vascular implants and grafts: Secondary | ICD-10-CM | POA: Diagnosis not present

## 2021-08-03 DIAGNOSIS — Z7982 Long term (current) use of aspirin: Secondary | ICD-10-CM

## 2021-08-03 DIAGNOSIS — Z7984 Long term (current) use of oral hypoglycemic drugs: Secondary | ICD-10-CM

## 2021-08-03 DIAGNOSIS — C801 Malignant (primary) neoplasm, unspecified: Secondary | ICD-10-CM | POA: Diagnosis not present

## 2021-08-03 DIAGNOSIS — Z79899 Other long term (current) drug therapy: Secondary | ICD-10-CM

## 2021-08-03 DIAGNOSIS — C771 Secondary and unspecified malignant neoplasm of intrathoracic lymph nodes: Secondary | ICD-10-CM | POA: Diagnosis present

## 2021-08-03 DIAGNOSIS — Z66 Do not resuscitate: Secondary | ICD-10-CM | POA: Diagnosis not present

## 2021-08-03 DIAGNOSIS — D72829 Elevated white blood cell count, unspecified: Secondary | ICD-10-CM | POA: Diagnosis present

## 2021-08-03 DIAGNOSIS — C787 Secondary malignant neoplasm of liver and intrahepatic bile duct: Secondary | ICD-10-CM | POA: Diagnosis present

## 2021-08-03 DIAGNOSIS — C457 Mesothelioma of other sites: Secondary | ICD-10-CM

## 2021-08-03 DIAGNOSIS — R918 Other nonspecific abnormal finding of lung field: Secondary | ICD-10-CM | POA: Diagnosis not present

## 2021-08-03 DIAGNOSIS — K6389 Other specified diseases of intestine: Secondary | ICD-10-CM | POA: Diagnosis not present

## 2021-08-03 DIAGNOSIS — Z8249 Family history of ischemic heart disease and other diseases of the circulatory system: Secondary | ICD-10-CM

## 2021-08-03 DIAGNOSIS — R570 Cardiogenic shock: Secondary | ICD-10-CM | POA: Diagnosis not present

## 2021-08-03 DIAGNOSIS — Z888 Allergy status to other drugs, medicaments and biological substances status: Secondary | ICD-10-CM

## 2021-08-03 DIAGNOSIS — R29898 Other symptoms and signs involving the musculoskeletal system: Secondary | ICD-10-CM | POA: Diagnosis not present

## 2021-08-03 DIAGNOSIS — R000 Tachycardia, unspecified: Secondary | ICD-10-CM | POA: Diagnosis not present

## 2021-08-03 DIAGNOSIS — R911 Solitary pulmonary nodule: Secondary | ICD-10-CM | POA: Diagnosis not present

## 2021-08-03 DIAGNOSIS — C459 Mesothelioma, unspecified: Secondary | ICD-10-CM | POA: Diagnosis not present

## 2021-08-03 DIAGNOSIS — Z8546 Personal history of malignant neoplasm of prostate: Secondary | ICD-10-CM

## 2021-08-03 DIAGNOSIS — N179 Acute kidney failure, unspecified: Secondary | ICD-10-CM | POA: Diagnosis not present

## 2021-08-03 DIAGNOSIS — E1369 Other specified diabetes mellitus with other specified complication: Secondary | ICD-10-CM

## 2021-08-03 DIAGNOSIS — R490 Dysphonia: Secondary | ICD-10-CM | POA: Diagnosis not present

## 2021-08-03 DIAGNOSIS — I10 Essential (primary) hypertension: Secondary | ICD-10-CM

## 2021-08-03 DIAGNOSIS — D6959 Other secondary thrombocytopenia: Secondary | ICD-10-CM | POA: Diagnosis present

## 2021-08-03 DIAGNOSIS — E871 Hypo-osmolality and hyponatremia: Secondary | ICD-10-CM | POA: Diagnosis present

## 2021-08-03 DIAGNOSIS — C786 Secondary malignant neoplasm of retroperitoneum and peritoneum: Principal | ICD-10-CM | POA: Diagnosis present

## 2021-08-03 DIAGNOSIS — C450 Mesothelioma of pleura: Secondary | ICD-10-CM | POA: Diagnosis present

## 2021-08-03 DIAGNOSIS — E1165 Type 2 diabetes mellitus with hyperglycemia: Secondary | ICD-10-CM | POA: Diagnosis present

## 2021-08-03 DIAGNOSIS — C7971 Secondary malignant neoplasm of right adrenal gland: Secondary | ICD-10-CM | POA: Diagnosis present

## 2021-08-03 DIAGNOSIS — E872 Acidosis, unspecified: Secondary | ICD-10-CM | POA: Diagnosis not present

## 2021-08-03 DIAGNOSIS — M25552 Pain in left hip: Secondary | ICD-10-CM | POA: Diagnosis not present

## 2021-08-03 DIAGNOSIS — Z9221 Personal history of antineoplastic chemotherapy: Secondary | ICD-10-CM

## 2021-08-03 DIAGNOSIS — Z8719 Personal history of other diseases of the digestive system: Secondary | ICD-10-CM

## 2021-08-03 DIAGNOSIS — R579 Shock, unspecified: Secondary | ICD-10-CM | POA: Diagnosis not present

## 2021-08-03 DIAGNOSIS — Z808 Family history of malignant neoplasm of other organs or systems: Secondary | ICD-10-CM

## 2021-08-03 DIAGNOSIS — A419 Sepsis, unspecified organism: Secondary | ICD-10-CM

## 2021-08-03 DIAGNOSIS — Z87891 Personal history of nicotine dependence: Secondary | ICD-10-CM

## 2021-08-03 DIAGNOSIS — J9601 Acute respiratory failure with hypoxia: Secondary | ICD-10-CM | POA: Diagnosis not present

## 2021-08-03 DIAGNOSIS — G931 Anoxic brain damage, not elsewhere classified: Secondary | ICD-10-CM | POA: Diagnosis not present

## 2021-08-03 DIAGNOSIS — K769 Liver disease, unspecified: Secondary | ICD-10-CM | POA: Diagnosis not present

## 2021-08-03 DIAGNOSIS — I469 Cardiac arrest, cause unspecified: Secondary | ICD-10-CM | POA: Diagnosis not present

## 2021-08-03 DIAGNOSIS — Z96652 Presence of left artificial knee joint: Secondary | ICD-10-CM | POA: Diagnosis present

## 2021-08-03 DIAGNOSIS — R739 Hyperglycemia, unspecified: Principal | ICD-10-CM

## 2021-08-03 DIAGNOSIS — C799 Secondary malignant neoplasm of unspecified site: Secondary | ICD-10-CM | POA: Diagnosis present

## 2021-08-03 DIAGNOSIS — G893 Neoplasm related pain (acute) (chronic): Secondary | ICD-10-CM | POA: Diagnosis present

## 2021-08-03 DIAGNOSIS — M25511 Pain in right shoulder: Secondary | ICD-10-CM | POA: Diagnosis present

## 2021-08-03 DIAGNOSIS — D630 Anemia in neoplastic disease: Secondary | ICD-10-CM | POA: Diagnosis present

## 2021-08-03 DIAGNOSIS — Z820 Family history of epilepsy and other diseases of the nervous system: Secondary | ICD-10-CM

## 2021-08-03 DIAGNOSIS — Z9079 Acquired absence of other genital organ(s): Secondary | ICD-10-CM

## 2021-08-03 LAB — COMPREHENSIVE METABOLIC PANEL
ALT: 27 U/L (ref 0–44)
AST: 14 U/L — ABNORMAL LOW (ref 15–41)
Albumin: 3.9 g/dL (ref 3.5–5.0)
Alkaline Phosphatase: 65 U/L (ref 38–126)
Anion gap: 15 (ref 5–15)
BUN: 24 mg/dL — ABNORMAL HIGH (ref 8–23)
CO2: 25 mmol/L (ref 22–32)
Calcium: 9.6 mg/dL (ref 8.9–10.3)
Chloride: 92 mmol/L — ABNORMAL LOW (ref 98–111)
Creatinine, Ser: 0.97 mg/dL (ref 0.61–1.24)
GFR, Estimated: >60 mL/min (ref >60)
Glucose, Bld: 239 mg/dL — ABNORMAL HIGH (ref 70–99)
Potassium: 3.9 mmol/L (ref 3.5–5.1)
Sodium: 132 mmol/L — ABNORMAL LOW (ref 135–145)
Total Bilirubin: 0.5 mg/dL (ref 0.3–1.2)
Total Protein: 6.8 g/dL (ref 6.5–8.1)

## 2021-08-03 LAB — URINALYSIS, ROUTINE W REFLEX MICROSCOPIC
Bilirubin Urine: NEGATIVE
Glucose, UA: NEGATIVE mg/dL
Hgb urine dipstick: NEGATIVE
Ketones, ur: NEGATIVE mg/dL
Leukocytes,Ua: NEGATIVE
Nitrite: NEGATIVE
Protein, ur: NEGATIVE mg/dL
Specific Gravity, Urine: <1.005 — ABNORMAL LOW (ref 1.005–1.030)
pH: 5.5 (ref 5.0–8.0)

## 2021-08-03 LAB — CBC
HCT: 35.8 % — ABNORMAL LOW (ref 39.0–52.0)
Hemoglobin: 11.4 g/dL — ABNORMAL LOW (ref 13.0–17.0)
MCH: 28.1 pg (ref 26.0–34.0)
MCHC: 31.8 g/dL (ref 30.0–36.0)
MCV: 88.4 fL (ref 80.0–100.0)
Platelets: 129 10*3/uL — ABNORMAL LOW (ref 150–400)
RBC: 4.05 MIL/uL — ABNORMAL LOW (ref 4.22–5.81)
RDW: 17.7 % — ABNORMAL HIGH (ref 11.5–15.5)
WBC: 18.2 10*3/uL — ABNORMAL HIGH (ref 4.0–10.5)
nRBC: 0 % (ref 0.0–0.2)

## 2021-08-03 LAB — LIPASE, BLOOD: Lipase: 16 U/L (ref 11–51)

## 2021-08-03 LAB — LACTIC ACID, PLASMA
Lactic Acid, Venous: 3.3 mmol/L (ref 0.5–1.9)
Lactic Acid, Venous: 1.9 mmol/L (ref 0.5–1.9)

## 2021-08-03 MED ORDER — MORPHINE SULFATE (PF) 4 MG/ML IV SOLN
4.0000 mg | Freq: Once | INTRAVENOUS | Status: AC
  Administered 2021-08-03: 4 mg via INTRAVENOUS
  Filled 2021-08-03: qty 1

## 2021-08-03 MED ORDER — HYDROMORPHONE HCL 1 MG/ML IJ SOLN
1.0000 mg | INTRAMUSCULAR | Status: DC | PRN
  Administered 2021-08-03 – 2021-08-04 (×5): 1 mg via INTRAVENOUS
  Filled 2021-08-03 (×5): qty 1

## 2021-08-03 MED ORDER — SODIUM CHLORIDE 0.9 % IV SOLN: Freq: Once | INTRAVENOUS | Status: AC

## 2021-08-03 MED ORDER — SENNA 8.6 MG PO TABS
1.0000 | ORAL_TABLET | Freq: Every day | ORAL | Status: DC
  Administered 2021-08-03: 8.6 mg via ORAL
  Filled 2021-08-03 (×2): qty 1

## 2021-08-03 MED ORDER — MORPHINE SULFATE (PF) 4 MG/ML IV SOLN
2.0000 mg | INTRAVENOUS | Status: DC | PRN
  Administered 2021-08-04 (×2): 2 mg via INTRAVENOUS
  Filled 2021-08-03 (×2): qty 1

## 2021-08-03 MED ORDER — LOSARTAN POTASSIUM 25 MG PO TABS
100.0000 mg | ORAL_TABLET | Freq: Every day | ORAL | Status: DC
Start: 2021-08-04 — End: 2021-08-04
  Filled 2021-08-03: qty 2
  Filled 2021-08-03: qty 4

## 2021-08-03 MED ORDER — FERROUS SULFATE 325 (65 FE) MG PO TABS
325.0000 mg | ORAL_TABLET | Freq: Every day | ORAL | Status: DC
  Filled 2021-08-03: qty 1

## 2021-08-03 MED ORDER — ONDANSETRON HCL 4 MG/2ML IJ SOLN
4.0000 mg | Freq: Once | INTRAMUSCULAR | Status: AC
Start: 2021-08-03 — End: 2021-08-03
  Administered 2021-08-03: 4 mg via INTRAVENOUS
  Filled 2021-08-03: qty 2

## 2021-08-03 MED ORDER — ROSUVASTATIN CALCIUM 20 MG PO TABS
20.0000 mg | ORAL_TABLET | Freq: Every evening | ORAL | Status: DC
  Administered 2021-08-04: 20 mg via ORAL
  Filled 2021-08-03: qty 1

## 2021-08-03 MED ORDER — ACETAMINOPHEN 500 MG PO TABS: 1000.0000 mg | ORAL_TABLET | Freq: Four times a day (QID) | ORAL | Status: DC | PRN

## 2021-08-03 MED ORDER — LORATADINE 10 MG PO TABS
10.0000 mg | ORAL_TABLET | Freq: Every day | ORAL | Status: DC
  Filled 2021-08-03: qty 1

## 2021-08-03 MED ORDER — FOLIC ACID 1 MG PO TABS
1.0000 mg | ORAL_TABLET | Freq: Every day | ORAL | Status: DC
  Filled 2021-08-03: qty 1

## 2021-08-03 MED ORDER — SIMETHICONE 80 MG PO CHEW: 80.0000 mg | CHEWABLE_TABLET | Freq: Four times a day (QID) | ORAL | Status: DC | PRN

## 2021-08-03 MED ORDER — MUSCLE RUB 10-15 % EX CREA
TOPICAL_CREAM | CUTANEOUS | Status: DC | PRN
Start: 2021-08-03 — End: 2021-08-06
  Filled 2021-08-03: qty 85

## 2021-08-03 MED ORDER — METRONIDAZOLE 500 MG/100ML IV SOLN
500.0000 mg | Freq: Once | INTRAVENOUS | Status: AC
  Administered 2021-08-03: 500 mg via INTRAVENOUS
  Filled 2021-08-03: qty 100

## 2021-08-03 MED ORDER — SODIUM CHLORIDE 0.9 % IV SOLN
2.0000 g | Freq: Once | INTRAVENOUS | Status: AC
  Administered 2021-08-03: 2 g via INTRAVENOUS
  Filled 2021-08-03: qty 20

## 2021-08-03 MED ORDER — ENOXAPARIN SODIUM 40 MG/0.4ML IJ SOSY
40.0000 mg | PREFILLED_SYRINGE | INTRAMUSCULAR | Status: DC
  Administered 2021-08-03 – 2021-08-04 (×2): 40 mg via SUBCUTANEOUS
  Filled 2021-08-03 (×3): qty 0.4

## 2021-08-03 MED ORDER — LOSARTAN POTASSIUM-HCTZ 100-12.5 MG PO TABS: 1.0000 | ORAL_TABLET | Freq: Every day | ORAL | Status: DC

## 2021-08-03 MED ORDER — IOHEXOL 300 MG/ML  SOLN
75.0000 mL | Freq: Once | INTRAMUSCULAR | Status: AC | PRN
Start: 2021-08-03 — End: 2021-08-03
  Administered 2021-08-03: 100 mL via INTRAVENOUS

## 2021-08-03 MED ORDER — HYDROCHLOROTHIAZIDE 12.5 MG PO TABS
12.5000 mg | ORAL_TABLET | Freq: Every day | ORAL | Status: DC
  Administered 2021-08-04: 12.5 mg via ORAL
  Filled 2021-08-03: qty 1

## 2021-08-03 MED ORDER — CALCIUM CARBONATE ANTACID 500 MG PO CHEW: 1000.0000 mg | CHEWABLE_TABLET | Freq: Every day | ORAL | Status: DC | PRN

## 2021-08-03 MED ORDER — PSYLLIUM 95 % PO PACK
1.0000 | PACK | Freq: Three times a day (TID) | ORAL | Status: DC
Start: 2021-08-04 — End: 2021-08-05
  Administered 2021-08-04 (×2): 1 via ORAL
  Filled 2021-08-03 (×5): qty 1

## 2021-08-03 MED ORDER — PANTOPRAZOLE SODIUM 40 MG PO TBEC
40.0000 mg | DELAYED_RELEASE_TABLET | Freq: Every day | ORAL | Status: DC
  Administered 2021-08-04: 40 mg via ORAL
  Filled 2021-08-03: qty 1

## 2021-08-03 NOTE — ED Provider Notes (Signed)
  Physical Exam  BP 116/88 (BP Location: Left Arm)   Pulse (!) 111   Temp 97.9 F (36.6 C) (Oral)   Resp (!) 21   Ht 5\' 7"  (1.702 m)   Wt 82.2 kg   SpO2 98%   BMI 28.38 kg/m   Physical Exam  Procedures  Procedures  ED Course / MDM    Medical Decision Making Amount and/or Complexity of Data Reviewed Labs: ordered. Radiology: ordered.  Risk Prescription drug management.   Patient presents abdominal pain.  Transfer from outside hospital had planned admission but unable to get CT scan.  White count is elevated.  CT scan of chest abdomen ordered here but not resulted yet.  Lactate initially elevated but is improved.  CT scan is resulted.  No clear infection.  Both lungs and abdomen free of likely infection.  However unfortunately worsening metastatic cancer both in size and locations.  Still having abdominal pain.  White count was elevated.  I feel if he would still benefit from mission to the hospital.  Will need pain control.  Had been uncontrolled on his oral medicines at home and potentially oncology consult while he is here.  Will discuss with hospitalist.  Also blood culture sent and potentially still could be bacteremic, although I believe the pain is most likely from the worsening metastatic disease.       Davonna Belling, MD 08/10/2021 1745

## 2021-08-03 NOTE — ED Triage Notes (Signed)
Patient here POV from Home.  Endorses ABD Pain that began yesterday AM. States it is Mid ABD but radiates across and around to Back. Associated with Constipation, Moderate Nausea.   States he was Hypotensive at Home at 90/60 after Enema. Was Constipated prior to Enema Administration.  No Emesis. Last Chemotherapy Session was 2 Weeks PTA.   NAD Noted during Triage. A&Ox4. GCS 15. BIB Wheelchair.

## 2021-08-03 NOTE — ED Notes (Addendum)
MD notified of Lactic Levels. Next levels not due until 1545

## 2021-08-03 NOTE — Assessment & Plan Note (Signed)
-  with liver and abdominal metastases -previous treatment with carboplatin, Alimta and Avastin for 23 cycles and the patient took a break for few months because of fatigue and weakness as well as diverticulitis with microperforation.  Unfortunately he developed disease progression after taking the break of the treatment and he started systemic chemotherapy again with carboplatin and Alimta. Last cycle #2 was on 6/6 -CT A/P now showing overall worsening progression of disease with new bilateral adrenal metastastes -Oncologist Dr. Julien Nordmann. Need to consult in the morning. Also potentially needs palliative care consult.

## 2021-08-03 NOTE — Assessment & Plan Note (Signed)
-  recommend ENT evaluation outpatient. Could also consider CT neck soft tissue to evaluate for metastatic disease

## 2021-08-03 NOTE — ED Notes (Signed)
Patient transported to X-ray 

## 2021-08-03 NOTE — ED Notes (Signed)
When asked about Pain Pt stated that is was "resting" and that he was doing well

## 2021-08-03 NOTE — Assessment & Plan Note (Signed)
-  Last HbA1C of 6.8 (5/1)

## 2021-08-03 NOTE — ED Notes (Signed)
Multiple sticks and attempts on blood culture, unableto obtain. Orders for same where placed after intial IV and after antibiotics started

## 2021-08-03 NOTE — Assessment & Plan Note (Signed)
Left LE also more weak for the past 2-3 weeks with slight thigh pain.  -obtain left hip X-ray

## 2021-08-03 NOTE — ED Provider Notes (Addendum)
[facility] DEPT Provider Note   CSN: [id number] Arrival date & time: 08/04/2021  1140     History  Chief Complaint  Patient presents with   Abdominal Pain    BARDIA WANGERIN is a 81 y.o. male.  Patient is an 81 year old male with past medical history of mesothelioma presenting for complaints of abdominal pain.  Chart review demonstrates approximately 2 months ago on 06/13/2021 patient had a microperforation thought to be secondary to patient's chemotherapy Carboplatin. Oncologist Dr. Julien Nordmann. Patient's microperforation walled itself off at that time without any intra-abdominal interventions and stopped chemotherapy at that time.  Patient has recently resumed chemotherapy about 2 weeks ago.  Admits to generalized abdominal pain that began yesterday morning, rating to his back, severe, with associated nausea.  History of constipation.  Last bowel movement was yesterday and normal in nature.  The history is provided by the patient. No language interpreter was used.  Abdominal Pain Associated symptoms: constipation and nausea   Associated symptoms: no chest pain, no chills, no cough, no dysuria, no fever, no hematuria, no shortness of breath, no sore throat and no vomiting        Home Medications Prior to Admission medications   Medication Sig Start Date End Date Taking? Authorizing Provider  acetaminophen (TYLENOL) 500 MG tablet Take 2 tablets (1,000 mg total) by mouth every 6 (six) hours as needed for mild pain or fever. Patient taking differently: Take 500 mg by mouth every 6 (six) hours as needed for mild pain or fever. 11/15/19   Barrett, Lodema Hong, PA-C  aspirin EC 81 MG tablet Take 81 mg by mouth daily. Swallow whole.    [provider]  calcium carbonate (TUMS - DOSED IN MG ELEMENTAL CALCIUM) 500 MG chewable tablet Chew 1,000 mg by mouth daily as needed for indigestion or heartburn.    [provider]  Cetirizine HCl (ZYRTEC ALLERGY) 10 MG  CAPS Zyrtec 10 mg capsule  Take by oral route.    [provider]  dexamethasone (DECADRON) 4 MG tablet 1 tablet p.o. twice daily the day before, day of and day after chemotherapy every 3 weeks 06/23/21   Curt Bears, MD  ferrous sulfate 325 (65 FE) MG tablet Take 325 mg by mouth daily with breakfast.    [provider]  fluticasone (FLONASE) 50 MCG/ACT nasal spray Place 1 spray into both nostrils daily. 06/06/21   Crain, Whitney L, PA  folic acid (FOLVITE) 1 MG tablet TAKE 1 TABLET BY MOUTH EVERY DAY 08/07/2021   Heilingoetter, Cassandra L, PA-C  JARDIANCE 10 MG TABS tablet TAKE 1 TABLET BY MOUTH DAILY BEFORE BREAKFAST. 04/07/21   [name], MD  lidocaine-prilocaine (EMLA) cream Apply to Port-A-Cath site 30-60-minute before treatment. 09/04/20   Curt Bears, MD  losartan-hydrochlorothiazide [facility]) 100-12.5 MG tablet TAKE 1 TABLET BY MOUTH EVERY DAY 01/03/21   [name], MD  metFORMIN (GLUCOPHAGE) 500 MG tablet TAKE 1 TABLET BY MOUTH 2 TIMES DAILY WITH A MEAL. 03/11/21   [name], MD  ondansetron (ZOFRAN-ODT) 4 MG disintegrating tablet TAKE 1 TABLET (4 MG TOTAL) BY MOUTH EVERY 8 (EIGHT) HOURS AS NEEDED FOR NAUSEA OR VOMITING. STARTING 3 DAYS AFTER CHEMOTHERAPY 06/30/21   Heilingoetter, Cassandra L, PA-C  pantoprazole (PROTONIX) 40 MG tablet TAKE 1 TABLET BY MOUTH EVERY DAY 07/26/21   [name], MD  prochlorperazine (COMPAZINE) 10 MG tablet Take 1 tablet (10 mg total) by mouth every 6 (six) hours as needed for nausea  or vomiting. 06/23/21   Curt Bears, MD  psyllium (METAMUCIL) 58.6 % powder Take 1 packet by mouth 3 (three) times daily.    [provider]  rosuvastatin (CRESTOR) 20 MG tablet TAKE 1 TABLET BY MOUTH EVERY DAY 08/01/20   O'Neal, Cassie Freer, MD  senna (SENOKOT) 8.6 MG tablet Take 1 tablet by mouth daily.    [provider]  simethicone (MYLICON) 277 MG chewable tablet Chew 125 mg by mouth daily as  needed (gas).    [provider]  traMADol (ULTRAM) 50 MG tablet TAKE 1 TABLET BY MOUTH EVERY 12 HOURS AS NEEDED. 07/27/21   Curt Bears, MD      Allergies    Carboplatin    Review of Systems   Review of Systems  Constitutional:  Negative for chills and fever.  HENT:  Negative for ear pain and sore throat.   Eyes:  Negative for pain and visual disturbance.  Respiratory:  Negative for cough and shortness of breath.   Cardiovascular:  Negative for chest pain and palpitations.  Gastrointestinal:  Positive for abdominal distention, abdominal pain, constipation and nausea. Negative for vomiting.  Genitourinary:  Negative for dysuria and hematuria.  Musculoskeletal:  Negative for arthralgias and back pain.  Skin:  Negative for color change and rash.  Neurological:  Negative for seizures and syncope.  All other systems reviewed and are negative.   Physical Exam Updated Vital Signs BP 112/84   Pulse (!) 114   Temp 97.8 F (36.6 C)   Resp (!) 26   Ht 5\' 7"  (1.702 m)   Wt 82.2 kg   SpO2 97%   BMI 28.38 kg/m  Physical Exam Vitals and nursing note reviewed.  Constitutional:      General: He is not in acute distress.    Appearance: He is well-developed.  HENT:     Head: Normocephalic and atraumatic.  Eyes:     Conjunctiva/sclera: Conjunctivae normal.  Cardiovascular:     Rate and Rhythm: Normal rate and regular rhythm.     Heart sounds: No murmur heard. Pulmonary:     Effort: Pulmonary effort is normal. No respiratory distress.     Breath sounds: Normal breath sounds.  Abdominal:     General: Abdomen is protuberant. There is distension.     Palpations: Abdomen is soft.     Tenderness: There is generalized abdominal tenderness. There is no guarding or rebound.  Musculoskeletal:        General: No swelling.     Cervical back: Neck supple.  Skin:    General: Skin is warm and dry.     Capillary Refill: Capillary refill takes less than 2 seconds.  Neurological:      Mental Status: He is alert.  Psychiatric:        Mood and Affect: Mood normal.     ED Results / Procedures / Treatments   Labs (all labs ordered are listed, but only abnormal results are displayed) Labs Reviewed  COMPREHENSIVE METABOLIC PANEL - Abnormal; Notable for the following components:      Result Value   Sodium 132 (*)    Chloride 92 (*)    Glucose, Bld 239 (*)    BUN 24 (*)    AST 14 (*)    All other components within normal limits  CBC - Abnormal; Notable for the following components:   WBC 18.2 (*)    RBC 4.05 (*)    Hemoglobin 11.4 (*)    HCT 35.8 (*)  RDW 17.7 (*)    Platelets 129 (*)    All other components within normal limits  CULTURE, BLOOD (ROUTINE X 2)  CULTURE, BLOOD (ROUTINE X 2)  LIPASE, BLOOD  URINALYSIS, ROUTINE W REFLEX MICROSCOPIC  LACTIC ACID, PLASMA  LACTIC ACID, PLASMA    EKG None  Radiology DG ABD ACUTE 2+V W 1V CHEST  Result Date: 07/16/2021 CLINICAL DATA:  Abdomen pain with constipation and nausea. EXAM: DG ABDOMEN ACUTE WITH 1 VIEW CHEST COMPARISON:  Chest x-ray June 13, 2021 FINDINGS: There is no evidence of dilated bowel loops or free intraperitoneal air. Moderate bowel content is identified in the colon. No radiopaque calculi or other significant radiographic abnormality is seen. The mediastinal contour and cardiac silhouette are stable. Left central venous is unchanged. The heart size enlarged. Mild patchy opacities identified in the left lung base with left pleural effusion. IMPRESSION: No bowel obstruction or free air. Moderate bowel content identified in the colon. Mild patchy opacities identified in the left lung base with left pleural effusion, underlying pneumonia not excluded. Electronically Signed   By: Abelardo Diesel M.D.   On: 07/17/2021 13:07    Procedures .Critical Care  Performed by: Lianne Cure, DO Authorized by: Lianne Cure, DO   Critical care provider statement:    Critical care time (minutes):  30    Critical care end time:  08/12/2021 3:37 PM   Critical care was necessary to treat or prevent imminent or life-threatening deterioration of the following conditions:  Sepsis   Critical care was time spent personally by me on the following activities:  Development of treatment plan with patient or surrogate, discussions with consultants, evaluation of patient's response to treatment, examination of patient, ordering and review of laboratory studies, ordering and review of radiographic studies, ordering and performing treatments and interventions, pulse oximetry, re-evaluation of patient's condition and review of old charts   Care discussed with: admitting provider       Medications Ordered in ED Medications  metroNIDAZOLE (FLAGYL) IVPB 500 mg (500 mg Intravenous New Bag/Given 07/18/2021 1356)  morphine (PF) 4 MG/ML injection 4 mg (4 mg Intravenous Given 08/08/2021 1306)  ondansetron (ZOFRAN) injection 4 mg (4 mg Intravenous Given 07/22/2021 1307)  0.9 %  sodium chloride infusion ( Intravenous New Bag/Given 07/17/2021 1305)  cefTRIAXone (ROCEPHIN) 2 g in sodium chloride 0.9 % 100 mL IVPB (0 g Intravenous Stopped 07/23/2021 1347)    ED Course/ Medical Decision Making/ A&P                           Medical Decision Making Amount and/or Complexity of Data Reviewed Labs: ordered. Radiology: ordered.  Risk Prescription drug management.   18:20 PM 81 year old male with past medical history of mesothelioma presenting for complaints of abdominal pain.  Patient is alert and oriented x3, afebrile, hypertension and tachycardia with otherwise stable vital signs.  On physical exam patient is resting uncomfortably in bed, abdomen is protuberant and distended, to palpation in all quadrants.  Concerns for repeat perforation.  Acute abdominal series ordered.  Elder Love studies concerning for sepsis including leukocytosis of 18.5, tachycardia of 111, and respiratory rate of 26.  Concern for intra-abdominal pathology.   Rocephin and Flagyl given.  IV fluids started.  No hypotension at this time.  Blood pressure currently 141/93.  Cultures and lactic acid sent.  2:47 PM Acute abdominal series as interpreted by radiologist, demonstrate no free air.  No evidence of bowel obstruction.  Moderate bowel can 10 identified in the colon.  Mild patchy opacities in the left lung base with pleural effusion.  Patient has history of mesothelioma involving the left hemithorax.  Associated mediastinal nodal metastasis and pericardial metastasis seen on previous CT angio of the chest on 06/13/2021.  X-ray findings likely secondary to this however cover for pneumonia and order CT chest and procalcitonin level.  [facility].  I spoke with hospitalist Dr. Myriam Jacobson who declines admission for sepsis with likely intra-abdominal etiology +/- pneumonia. Requested admission prior to CT imaging and declined. I spoke with ED physician Dr. Tyrone Nine and Elvina Sidle.         Final Clinical Impression(s) / ED Diagnoses Final diagnoses:  Hyperglycemia  Generalized abdominal pain  Mesothelioma (HCC)-intrathoracic lymph nodes  Other specified diabetes mellitus with other specified complication, unspecified whether long term insulin use ([place])  Sepsis, due to unspecified organism, unspecified whether acute organ dysfunction present [facility])    Rx / DC Orders ED Discharge Orders     None         Lianne Cure, DO 34/03/70 9643    Lianne Cure, DO 83/81/84 0375    Lianne Cure, DO 43/60/67 1537

## 2021-08-03 NOTE — Hospital Course (Signed)
50f w/ mesothelioma inthrathoracic cavity presenting for complaints of abdominal pain generalized radiating to back severe, had constipation, got enema x 1 at home, was hypotensive at home in 90s, 2 months ago on 06/13/2021 patie since 6/18 am, had a microperforation thought to be secondary to patient's chemotherapy Carboplatin-microperforation walled itself off at that time without any intra-abdominal interventions and stopped chemotherapy at that time-recently resumed chemotherapy about 2 weeks ago Labs wbc 18.2, bmp stable. Acute abd series neg. Tachy in 114, RR 26, bp stable. Lactate pending., CT pending before we ca accepet- [facility] trying to transfer to [facility] for CT and possible admission- may need surgery/hem onc consult based on CT. Asked to call back again after CT/lactate.

## 2021-08-03 NOTE — ED Notes (Signed)
Report given to Agricultural consultant and [employer] 

## 2021-08-03 NOTE — H&P (Signed)
History and Physical    Patient: Jack Haynes DTO:[id number] DOB: [birth year] DOA: 07/17/2021 DOS: the patient was seen and examined on 08/04/2021 PCP: [name], MD  Patient coming from: Home  Chief Complaint:  Chief Complaint  Patient presents with   Abdominal Pain   HPI: Jack Haynes is a 81 y.o. 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   Pain has been ongoing for 3 days but worse in the past 2 days. Pain is across the mid-abdominal with burning sensation. Still able to tolerate certain foods. No nausea or vomiting. No fever. Has been constipated but had bowel movement this morning following enema. Also notes hoarseness and gradually diminishing voice for the past 7-8 weeks. Has seen PCP and oncology thinks could be due to treatment. Has not seen ENT. Left LE also more weak for the past 2-3 weeks with slight thigh pain. Baseline ambulates independently.    He initially presented to [place] ED with abdominal pain and was transferred to [facility] ED for CT abdomen evaluation given his history of diverticulitis with microperforation thought to be from Avastin treatment in the past.   CT abdomen/Pelvis revealed diminished liver lesion metastases but new bilateral adrenal metastases. Overall progression of disease is worse than prior.  CT chest showed tumor invasion of the pericardium seen in prior imaging with increased soft tissue along left heart border.   He was given empirical antibiotics in the ED given he was tachycardia with leukocytosis of 18K.    Review of Systems: As mentioned in the history of present illness. All other systems reviewed and are negative. Past Medical History:  Diagnosis Date   Arthritis    Cancer ([place])    hx of prostatae cancer   Diverticulitis     Dyspnea    until chest tube placed   Hypertension    Mesothelioma [facility])    Past Surgical History:  Procedure Laterality Date   APPENDECTOMY     BACK SURGERY  1998   disectomy   CHEST TUBE INSERTION Left 11/13/2019   Procedure: INSERTION PLEURAL DRAINAGE CATHETER;  Surgeon: Grace Isaac, MD;  Location: [place];  Service: Thoracic;  Laterality: Left;   EYE SURGERY Left 1965   injury   IR [place]  05/27/2020   JOINT REPLACEMENT Left 2010   left knee replacement   KNEE SURGERY     PLEURAL BIOPSY Left 11/13/2019   Procedure: PLEURAL BIOPSY;  Surgeon: Grace Isaac, MD;  Location: [place];  Service: Thoracic;  Laterality: Left;   PORTACATH PLACEMENT Left 11/26/2019   Procedure: INSERTION PORT-A-CATH BARD POWERPORT 9.6 FR CATHETER;  Surgeon: Grace Isaac, MD;  Location: [place];  Service: Thoracic;  Laterality: Left;   PROSTATE SURGERY     REMOVAL OF PLEURAL DRAINAGE CATHETER Left 11/26/2019   Procedure: REMOVAL OF PLEURAL DRAINAGE CATHETER;  Surgeon: Grace Isaac, MD;  Location: [place];  Service: Thoracic;  Laterality: Left;   SPINE SURGERY     TALC PLEURODESIS Left 11/13/2019   Procedure: possible TALC PLEURADESIS;  Surgeon: Grace Isaac, MD;  Location: [place];  Service: Thoracic;  Laterality: Left;   TOTAL KNEE ARTHROPLASTY  02/01/2012   Procedure: TOTAL KNEE ARTHROPLASTY;  Surgeon: Sydnee Cabal, MD;  Location: [facility];  Service: Orthopedics;  Laterality: Left;  VIDEO ASSISTED THORACOSCOPY Left 11/13/2019   Procedure: VIDEO ASSISTED THORACOSCOPY;  Surgeon: Grace Isaac, MD;  Location: [place];  Service: Thoracic;  Laterality: Left;   VIDEO BRONCHOSCOPY N/A 11/13/2019   Procedure: VIDEO BRONCHOSCOPY;  Surgeon: Grace Isaac, MD;  Location: [facility] OR;  Service: Thoracic;  Laterality: N/A;   Social History:  reports that he quit smoking about 40 years ago. His smoking use included cigarettes. He quit smokeless tobacco use about 2  years ago.  His smokeless tobacco use included chew. He reports current alcohol use of about 3.0 standard drinks of alcohol per week. He reports that he does not use drugs.  Allergies  Allergen Reactions   Carboplatin Anaphylaxis, Nausea Only and Other (See Comments)    nonresponsive    Family History  Problem Relation Age of Onset   Cancer Sister        brain tumor   Heart disease Brother    Alzheimer's disease Brother    Heart disease Sister    Alzheimer's disease Father    Hypertension Sister    Colon cancer Neg Hx    Colon polyps Neg Hx    Esophageal cancer Neg Hx    Rectal cancer Neg Hx    Stomach cancer Neg Hx     Prior to Admission medications   Medication Sig Start Date End Date Taking? Authorizing Provider  acetaminophen (TYLENOL) 500 MG tablet Take 2 tablets (1,000 mg total) by mouth every 6 (six) hours as needed for mild pain or fever. Patient taking differently: Take 500 mg by mouth every 6 (six) hours as needed for mild pain or fever. 11/15/19   Barrett, Lodema Hong, PA-C  aspirin EC 81 MG tablet Take 81 mg by mouth daily. Swallow whole.    [provider]  calcium carbonate (TUMS - DOSED IN MG ELEMENTAL CALCIUM) 500 MG chewable tablet Chew 1,000 mg by mouth daily as needed for indigestion or heartburn.    [provider]  Cetirizine HCl (ZYRTEC ALLERGY) 10 MG CAPS Zyrtec 10 mg capsule  Take by oral route.    [provider]  dexamethasone (DECADRON) 4 MG tablet 1 tablet p.o. twice daily the day before, day of and day after chemotherapy every 3 weeks 06/23/21   Curt Bears, MD  ferrous sulfate 325 (65 FE) MG tablet Take 325 mg by mouth daily with breakfast.    [provider]  fluticasone (FLONASE) 50 MCG/ACT nasal spray Place 1 spray into both nostrils daily. 06/06/21   Crain, Whitney L, PA  folic acid (FOLVITE) 1 MG tablet TAKE 1 TABLET BY MOUTH EVERY DAY 08/06/2021   Heilingoetter, Cassandra L, PA-C  JARDIANCE 10 MG TABS tablet TAKE 1  TABLET BY MOUTH DAILY BEFORE BREAKFAST. 04/07/21   [name], MD  lidocaine-prilocaine (EMLA) cream Apply to Port-A-Cath site 30-60-minute before treatment. 09/04/20   Curt Bears, MD  losartan-hydrochlorothiazide [facility]) 100-12.5 MG tablet TAKE 1 TABLET BY MOUTH EVERY DAY 01/03/21   [name], MD  metFORMIN (GLUCOPHAGE) 500 MG tablet TAKE 1 TABLET BY MOUTH 2 TIMES DAILY WITH A MEAL. 03/11/21   [name], MD  ondansetron (ZOFRAN-ODT) 4 MG disintegrating tablet TAKE 1 TABLET (4 MG TOTAL) BY MOUTH EVERY 8 (EIGHT) HOURS AS NEEDED FOR NAUSEA OR VOMITING. STARTING 3 DAYS AFTER CHEMOTHERAPY 06/30/21   Heilingoetter, Cassandra L, PA-C  pantoprazole (PROTONIX) 40 MG tablet TAKE 1 TABLET BY MOUTH EVERY DAY 07/26/21   [name], MD  prochlorperazine (COMPAZINE) 10  MG tablet Take 1 tablet (10 mg total) by mouth every 6 (six) hours as needed for nausea or vomiting. 06/23/21   Curt Bears, MD  psyllium (METAMUCIL) 58.6 % powder Take 1 packet by mouth 3 (three) times daily.    [provider]  rosuvastatin (CRESTOR) 20 MG tablet TAKE 1 TABLET BY MOUTH EVERY DAY 08/01/20   O'Neal, Cassie Freer, MD  senna (SENOKOT) 8.6 MG tablet Take 1 tablet by mouth daily.    [provider]  simethicone (MYLICON) 696 MG chewable tablet Chew 125 mg by mouth daily as needed (gas).    [provider]  traMADol (ULTRAM) 50 MG tablet TAKE 1 TABLET BY MOUTH EVERY 12 HOURS AS NEEDED. 07/27/21   Curt Bears, MD    Physical Exam: Vitals:   07/30/2021 1406 07/24/2021 1500 07/18/2021 1524 08/09/2021 2209  BP: 112/84 116/88  99/86  Pulse: (!) 114 (!) 111  (!) 113  Resp: (!) 26 (!) 21  20  Temp:   97.9 F (36.6 C) 97.6 F (36.4 C)  TempSrc:   Oral Oral  SpO2: 97% 98%  95%  Weight:      Height:       Constitutional: NAD, calm, comfortable, nontoxic appearing male laying flat in bed with weak/soft diminished voice. Eyes: PERRL, lids and conjunctivae  normal ENMT: Mucous membranes are moist.  Limited visualization of posterior pharynx but appears clear of any exudate or lesions.numerous dental caries.  Left-sided submandibular lymphadenopathy. Neck: normal, supple, no thyromegaly, Left-sided submandibular lymphadenopathy. Respiratory: clear to auscultation bilaterally, no wheezing, no crackles. Normal respiratory effort. No accessory muscle use.  Cardiovascular: Regular rate and rhythm, no murmurs / rubs / gallops. No extremity edema.  Abdomen: no tenderness, no masses palpated. Bowel sounds positive.  Musculoskeletal: no clubbing / cyanosis. No joint deformity upper and lower extremities. Good ROM, no contractures. Normal muscle tone.  Skin: no rashes, lesions, ulcers. No induration Neurologic: CN 2-12 grossly intact.  Decreased 4 out of 5 strength of left lower extremity.   Psychiatric: Normal judgment and insight. Alert and oriented x 3. Normal mood. Data Reviewed:  See HPI  Assessment and Plan: * Mesothelioma of left lung ([place]) -with liver and abdominal metastases -previous treatment with carboplatin, Alimta and Avastin for 23 cycles and the patient took a break for few months because of fatigue and weakness as well as diverticulitis with microperforation.  Unfortunately he developed disease progression after taking the break of the treatment and he started systemic chemotherapy again with carboplatin and Alimta. Last cycle #2 was on 6/6 -CT A/P now showing overall worsening progression of disease with new bilateral adrenal metastastes -Oncologist Dr. Julien Nordmann. Need to consult in the morning. Also potentially needs palliative care consult.   Voice hoarseness -obtain CT neck soft tissue to evaluate for metastatic disease. If findings unremarkable, recommend ENT follow up outpatient  Left leg weakness Left LE also more weak for the past 2-3 weeks with slight thigh pain.  -obtain left hip X-ray  Thrombocytopenia (HCC) Likely reactive  secondary to malignancy.  Continue follow-up with daily labs.  Leukocytosis Possibly reactive to worsening metastatic disease.  No fever or other symptoms suggestive of infection.  Blood cultures are pending.  Type 2 diabetes mellitus with hyperglycemia, without long-term current use of insulin (HCC) -Last HbA1C of 6.8 (5/1)  Essential hypertension Controlled.  Continue home losartan-HCTZ.      Advance Care Planning:   Code Status: Full Code   Consults: needs oncology consult  Family Communication:  No family at bedside  Severity of Illness: The appropriate patient status for this patient is OBSERVATION. Observation status is judged to be reasonable and necessary in order to provide the required intensity of service to ensure the patient's safety. The patient's presenting symptoms, physical exam findings, and initial radiographic and laboratory data in the context of their medical condition is felt to place them at decreased risk for further clinical deterioration. Furthermore, it is anticipated that the patient will be medically stable for discharge from the hospital within 2 midnights of admission.   Author: Orene Desanctis, DO 08/04/2021 12:34 AM  For on call review [email or web address].

## 2021-08-04 ENCOUNTER — Inpatient Hospital Stay: Payer: PPO

## 2021-08-04 ENCOUNTER — Inpatient Hospital Stay (HOSPITAL_COMMUNITY): Payer: PPO | Admitting: Certified Registered Nurse Anesthetist

## 2021-08-04 ENCOUNTER — Other Ambulatory Visit: Payer: PPO

## 2021-08-04 DIAGNOSIS — Z66 Do not resuscitate: Secondary | ICD-10-CM | POA: Diagnosis not present

## 2021-08-04 DIAGNOSIS — C787 Secondary malignant neoplasm of liver and intrahepatic bile duct: Secondary | ICD-10-CM | POA: Diagnosis present

## 2021-08-04 DIAGNOSIS — J9601 Acute respiratory failure with hypoxia: Secondary | ICD-10-CM | POA: Diagnosis not present

## 2021-08-04 DIAGNOSIS — Z515 Encounter for palliative care: Secondary | ICD-10-CM | POA: Diagnosis not present

## 2021-08-04 DIAGNOSIS — C7972 Secondary malignant neoplasm of left adrenal gland: Secondary | ICD-10-CM | POA: Diagnosis present

## 2021-08-04 DIAGNOSIS — Z4682 Encounter for fitting and adjustment of non-vascular catheter: Secondary | ICD-10-CM | POA: Diagnosis not present

## 2021-08-04 DIAGNOSIS — E1165 Type 2 diabetes mellitus with hyperglycemia: Secondary | ICD-10-CM | POA: Diagnosis present

## 2021-08-04 DIAGNOSIS — E872 Acidosis, unspecified: Secondary | ICD-10-CM | POA: Diagnosis not present

## 2021-08-04 DIAGNOSIS — C7971 Secondary malignant neoplasm of right adrenal gland: Secondary | ICD-10-CM | POA: Diagnosis present

## 2021-08-04 DIAGNOSIS — I10 Essential (primary) hypertension: Secondary | ICD-10-CM | POA: Diagnosis present

## 2021-08-04 DIAGNOSIS — K7200 Acute and subacute hepatic failure without coma: Secondary | ICD-10-CM | POA: Diagnosis not present

## 2021-08-04 DIAGNOSIS — R109 Unspecified abdominal pain: Secondary | ICD-10-CM | POA: Diagnosis present

## 2021-08-04 DIAGNOSIS — R570 Cardiogenic shock: Secondary | ICD-10-CM | POA: Diagnosis not present

## 2021-08-04 DIAGNOSIS — C450 Mesothelioma of pleura: Secondary | ICD-10-CM | POA: Diagnosis present

## 2021-08-04 DIAGNOSIS — N179 Acute kidney failure, unspecified: Secondary | ICD-10-CM | POA: Diagnosis not present

## 2021-08-04 DIAGNOSIS — C771 Secondary and unspecified malignant neoplasm of intrathoracic lymph nodes: Secondary | ICD-10-CM | POA: Diagnosis present

## 2021-08-04 DIAGNOSIS — C457 Mesothelioma of other sites: Secondary | ICD-10-CM | POA: Diagnosis not present

## 2021-08-04 DIAGNOSIS — D72829 Elevated white blood cell count, unspecified: Secondary | ICD-10-CM | POA: Diagnosis present

## 2021-08-04 DIAGNOSIS — D6959 Other secondary thrombocytopenia: Secondary | ICD-10-CM | POA: Diagnosis present

## 2021-08-04 DIAGNOSIS — C786 Secondary malignant neoplasm of retroperitoneum and peritoneum: Secondary | ICD-10-CM | POA: Diagnosis present

## 2021-08-04 DIAGNOSIS — J9811 Atelectasis: Secondary | ICD-10-CM | POA: Diagnosis not present

## 2021-08-04 DIAGNOSIS — J90 Pleural effusion, not elsewhere classified: Secondary | ICD-10-CM | POA: Diagnosis present

## 2021-08-04 DIAGNOSIS — R188 Other ascites: Secondary | ICD-10-CM | POA: Diagnosis present

## 2021-08-04 DIAGNOSIS — E871 Hypo-osmolality and hyponatremia: Secondary | ICD-10-CM | POA: Diagnosis present

## 2021-08-04 DIAGNOSIS — G8314 Monoplegia of lower limb affecting left nondominant side: Secondary | ICD-10-CM | POA: Diagnosis present

## 2021-08-04 DIAGNOSIS — I469 Cardiac arrest, cause unspecified: Secondary | ICD-10-CM | POA: Diagnosis not present

## 2021-08-04 DIAGNOSIS — D630 Anemia in neoplastic disease: Secondary | ICD-10-CM | POA: Diagnosis present

## 2021-08-04 DIAGNOSIS — C799 Secondary malignant neoplasm of unspecified site: Secondary | ICD-10-CM | POA: Diagnosis present

## 2021-08-04 DIAGNOSIS — Z95828 Presence of other vascular implants and grafts: Secondary | ICD-10-CM | POA: Diagnosis not present

## 2021-08-04 LAB — CBG MONITORING, ED
Glucose-Capillary: 229 mg/dL — ABNORMAL HIGH (ref 70–99)
Glucose-Capillary: 293 mg/dL — ABNORMAL HIGH (ref 70–99)

## 2021-08-04 LAB — CBC
HCT: 31.9 % — ABNORMAL LOW (ref 39.0–52.0)
Hemoglobin: 10.2 g/dL — ABNORMAL LOW (ref 13.0–17.0)
MCH: 28.5 pg (ref 26.0–34.0)
MCHC: 32 g/dL (ref 30.0–36.0)
MCV: 89.1 fL (ref 80.0–100.0)
Platelets: 133 10*3/uL — ABNORMAL LOW (ref 150–400)
RBC: 3.58 MIL/uL — ABNORMAL LOW (ref 4.22–5.81)
RDW: 18 % — ABNORMAL HIGH (ref 11.5–15.5)
WBC: 16.3 10*3/uL — ABNORMAL HIGH (ref 4.0–10.5)
nRBC: 0 % (ref 0.0–0.2)

## 2021-08-04 LAB — GLUCOSE, CAPILLARY: Glucose-Capillary: 257 mg/dL — ABNORMAL HIGH (ref 70–99)

## 2021-08-04 MED ORDER — SODIUM CHLORIDE 0.9 % IV BOLUS
250.0000 mL | Freq: Once | INTRAVENOUS | Status: AC
  Administered 2021-08-04: 250 mL via INTRAVENOUS

## 2021-08-04 MED ORDER — INSULIN ASPART 100 UNIT/ML IJ SOLN
0.0000 [IU] | Freq: Three times a day (TID) | INTRAMUSCULAR | Status: DC
  Administered 2021-08-04: 5 [IU] via SUBCUTANEOUS
  Administered 2021-08-05 (×2): 2 [IU] via SUBCUTANEOUS
  Filled 2021-08-04: qty 0.09

## 2021-08-04 MED ORDER — ONDANSETRON HCL 4 MG/2ML IJ SOLN: 4.0000 mg | Freq: Four times a day (QID) | INTRAMUSCULAR | Status: DC | PRN

## 2021-08-04 MED ORDER — PROCHLORPERAZINE EDISYLATE 10 MG/2ML IJ SOLN
10.0000 mg | INTRAMUSCULAR | Status: DC | PRN
Start: 2021-08-04 — End: 2021-08-06
  Administered 2021-08-04 (×2): 10 mg via INTRAVENOUS
  Filled 2021-08-04 (×2): qty 2

## 2021-08-04 MED ORDER — SODIUM CHLORIDE 0.9 % IV SOLN: INTRAVENOUS | Status: DC

## 2021-08-04 MED ORDER — BISACODYL 10 MG RE SUPP: 10.0000 mg | Freq: Every day | RECTAL | Status: DC | PRN

## 2021-08-04 NOTE — ED Notes (Signed)
Patient is alert.  Generalized pain 8/10.   2 mg morphine IV given.   Patient resting .   Wife at bedside.

## 2021-08-04 NOTE — Progress Notes (Signed)
PROGRESS NOTE    Jack Haynes  JZP:[id number] DOB: 07-25-1940 DOA: 08/04/2021 PCP: [name], MD    Brief Narrative:  28f w/ mesothelioma inthrathoracic cavity presenting for complaints of abdominal pain generalized radiating to back severe, had constipation, got enema x 1 at home, was hypotensive at home in 90s, 2 months ago on 06/13/2021 patie since 6/18 am, had a microperforation thought to be secondary to patient's chemotherapy Carboplatin-microperforation walled itself off at that time without any intra-abdominal interventions and stopped chemotherapy at that time-recently resumed chemotherapy about 2 weeks ago Labs wbc 18.2, bmp stable. Acute abd series neg. Tachy in 114, RR 26, bp stable. Lactate pending., CT pending before we ca accepet- DBED trying to transfer to [facility] for CT and possible admission- may need surgery/hem onc consult based on CT. Asked to call back again after CT/lactate.    Assessment and Plan:  Mesothelioma of left lung ([place]) -with liver and abdominal metastases -previous treatment with carboplatin, Alimta and Avastin for 23 cycles and the patient took a break for few months because of fatigue and weakness as well as diverticulitis with microperforation.  Unfortunately he developed disease progression after taking the break of the treatment and he started systemic chemotherapy again with carboplatin and Alimta. Last cycle #2 was on 6/6 -CT A/P now showing overall worsening progression of disease with new bilateral adrenal metastastes -Oncologist Dr. Julien Nordmann:  recommended for him consideration of palliative care and hospice but if the patient is not interested in this option at this point I would consider him for treatment with immunotherapy with a combination of ipilimumab and nivolumab on outpatient basis.  Voice hoarseness -obtain CT neck soft tissue to evaluate for metastatic disease. If findings unremarkable, recommend ENT follow up outpatient  Left  leg weakness Left LE also more weak for the past 2-3 weeks with slight thigh pain.  -left hip X-ray essentially  normal  Thrombocytopenia (HCC) Likely reactive secondary to malignancy.  Continue follow-up with daily labs.  Leukocytosis Possibly reactive to worsening metastatic disease.  No fever or other symptoms suggestive of infection.  Blood cultures are pending.  Type 2 diabetes mellitus with hyperglycemia, without long-term current use of insulin (HCC) -Last HbA1C of 6.8 (5/1)  Essential hypertension -BP low-- hold home meds  Hyponatremia -trend  hyperglycemia -SSI -regular diet for now     DVT prophylaxis: enoxaparin (LOVENOX) injection 40 mg Start: 07/22/2021 2200    Code Status: Full Code Family Communication: at bedside  Disposition Plan:  Level of care: Telemetry Status is: Observation The patient will require care spanning > 2 midnights and should be moved to inpatient because: needs IV nausea meds    Consultants:  oncology   Subjective: C/o nausea currently  Objective: Vitals:   08/04/21 0132 08/04/21 0449 08/04/21 0911 08/04/21 1035  BP: 91/65 102/80 (!) 120/95 102/85  Pulse: (!) 116 (!) 113 (!) 114 (!) 113  Resp: (!) 21 20 (!) 25 20  Temp:   97.6 F (36.4 C)   TempSrc:   Oral   SpO2: 94% 97% 99% 96%  Weight:      Height:        Intake/Output Summary (Last 24 hours) at 08/04/2021 1134 Last data filed at 08/04/2021 1021 Gross per 24 hour  Intake 1193.78 ml  Output --  Net 1193.78 ml   Filed Weights   08/12/2021 1147  Weight: 82.2 kg    Examination:   General: Appearance:     Overweight male who appears  uncomfortable   Abdomen distended  Lungs:     respirations unlabored  Heart:    Tachycardic.    MS:   All extremities are intact.    Neurologic:   Awake, alert, oriented x 3. No apparent focal neurological           defect.        Data Reviewed: I have personally reviewed following labs and imaging studies  CBC: Recent Labs   Lab 07/28/21 1321 08/01/2021 1155 08/04/21 0500  WBC 3.6* 18.2* 16.3*  NEUTROABS 2.3  --   --   HGB 10.5* 11.4* 10.2*  HCT 32.1* 35.8* 31.9*  MCV 86.5 88.4 89.1  PLT 81* 129* 373*   Basic Metabolic Panel: Recent Labs  Lab 07/28/21 1321 07/19/2021 1155  NA 135 132*  K 4.2 3.9  CL 100 92*  CO2 29 25  GLUCOSE 212* 239*  BUN 22 24*  CREATININE 0.92 0.97  CALCIUM 8.5* 9.6   GFR: Estimated Creatinine Clearance: 62.3 mL/min (by C-G formula based on SCr of 0.97 mg/dL). Liver Function Tests: Recent Labs  Lab 07/28/21 1321 07/18/2021 1155  AST 12* 14*  ALT 21 27  ALKPHOS 60 65  BILITOT 0.3 0.5  PROT 6.1* 6.8  ALBUMIN 3.4* 3.9   Recent Labs  Lab 07/27/2021 1155  LIPASE 16   No results for input(s): "AMMONIA" in the last 168 hours. Coagulation Profile: No results for input(s): "INR", "PROTIME" in the last 168 hours. Cardiac Enzymes: No results for input(s): "CKTOTAL", "CKMB", "CKMBINDEX", "TROPONINI" in the last 168 hours. BNP (last 3 results) No results for input(s): "PROBNP" in the last 8760 hours. HbA1C: No results for input(s): "HGBA1C" in the last 72 hours. CBG: No results for input(s): "GLUCAP" in the last 168 hours. Lipid Profile: No results for input(s): "CHOL", "HDL", "LDLCALC", "TRIG", "CHOLHDL", "LDLDIRECT" in the last 72 hours. Thyroid Function Tests: No results for input(s): "TSH", "T4TOTAL", "FREET4", "T3FREE", "THYROIDAB" in the last 72 hours. Anemia Panel: No results for input(s): "VITAMINB12", "FOLATE", "FERRITIN", "TIBC", "IRON", "RETICCTPCT" in the last 72 hours. Sepsis Labs: Recent Labs  Lab 07/25/2021 1345 08/14/2021 1550  LATICACIDVEN 3.3* 1.9    No results found for this or any previous visit (from the past 240 hour(s)).       Radiology Studies: DG HIP UNILAT WITH PELVIS 2-3 VIEWS LEFT  Result Date: 08/04/2021 CLINICAL DATA:  Left hip pain, no known injury, initial encounter EXAM: DG HIP (WITH OR WITHOUT PELVIS) 3V LEFT COMPARISON:  None  Available. FINDINGS: Postsurgical changes are noted consistent with prior prostatectomy. Pelvic ring is intact. Mild degenerative changes of hip joints and lumbar spine are noted. No lytic or sclerotic lesion is seen. No acute fracture is noted. IMPRESSION: Chronic changes without acute abnormality. Electronically Signed   By: Inez Catalina M.D.   On: 08/02/2021 20:27   CT Chest W Contrast  Result Date: 08/02/2021 CLINICAL DATA:  History of mesothelioma. Respiratory illness. Abdominal pain. EXAM: CT CHEST, ABDOMEN, AND PELVIS WITH CONTRAST TECHNIQUE: Multidetector CT imaging of the chest, abdomen and pelvis was performed following the standard protocol during bolus administration of intravenous contrast. RADIATION DOSE REDUCTION: This exam was performed according to the departmental dose-optimization program which includes automated exposure control, adjustment of the mA and/or kV according to patient size and/or use of iterative reconstruction technique.  CONTRAST:  162mL OMNIPAQUE IOHEXOL 300 MG/ML  SOLN COMPARISON:  CT angio chest 06/13/2021.  CT AP 06/13/2021. FINDINGS: CT CHEST FINDINGS Cardiovascular: Heart size appears normal.  Small pericardial effusion is identified. Signs of tumor invasion of the pericardium is again identified with increased soft tissue along the left heart border extending up to the left ventricular myocardium, image 37/2. Mediastinum/Nodes: Thyroid gland, trachea and esophagus demonstrate no significant findings. No enlarged axillary or supraclavicular lymph nodes. Nodal mass within the left pre vascular region is identified measuring 5.8 x 3.0 cm, image 19/2. On the previous exam this measured 3.9 x 2.1 cm. Nodal conglomeration within the left CP angle with signs of pericardial invasion is identified. This measures 5.1 x 5.6 cm, image 37/2. On the previous exam this measured 4.8 x 3.4 cm. Lungs/Pleura: Tumor rind encasing the left lung is again noted compatible with the history of  mesothelioma. This appears similar when compared with the previous exam. Pulmonary nodule within the posterior right upper lobe measures 1.4 cm, image 52/4. Previously 1.1 cm. No signs of airspace consolidation, pleural effusion or pneumothorax. Musculoskeletal: No chest wall mass or suspicious bone lesions identified. CT ABDOMEN PELVIS FINDINGS Hepatobiliary: Multifocal liver metastases are again identified. Similar in multiplicity. Index lesion along the dome of right hepatic lobe measures 1 cm, image 41/2. Previously 2.2 cm. Index lesion within central right lobe of liver measures 3.1 x 2.4 cm, image 45/2. Previously 3.9 x 3.3 cm.1 gallbladder is normal. No signs of bile duct dilatation. Pancreas: Unremarkable. No pancreatic ductal dilatation or surrounding inflammatory changes. Spleen: Normal in size without focal abnormality. Adrenals/Urinary Tract: Bilateral adrenal metastases are new from previous exam: Right adrenal nodule measures 2 cm, image 50/2. The left adrenal metastasis measures 3.0 cm, image 61/2. Kidneys are unremarkable. No mass or hydronephrosis identified. Bladder unremarkable. Stomach/Bowel: Stomach appears nondistended. No bowel wall thickening, inflammation, or distension. Moderate stool burden identified within the right colon scratch set there is a moderate stool burden identified within the colon. Vascular/Lymphatic: The upper abdominal vascularity remains patent. Aortic atherosclerosis without aneurysm. No conglomeration within the gastrohepatic ligament has mass effect upon the proximal stomach. This measures 7.1 x 6.6 cm, image 51/2. Previously this measured 3.2 x 3.6 cm. Reproductive: Status post prostatectomy. Other: Signs of tumor invasion of the left hemidiaphragm with evidence of peritoneal carcinomatosis identified. There is diffuse omental caking as noted previously. Scattered soft tissue nodules within the mesentery and peritoneum identified. Index nodule within the left upper  quadrant peritoneum measures 1.6 cm, image 47/2. This is a new finding compared with the previous exam. Tumor implant along the left lateral abdominal wall measures 3.3 x 2.0 cm, image 60/2. Previously 2.4 x 1.3 cm. Diffuse venous congestion is noted throughout the small bowel mesenteric fat which is favored to represent lymphatic obstruction secondary to tumor. Decrease abdominal ascites. Musculoskeletal: No acute or significant osseous findings. IMPRESSION: 1. No acute findings and no evidence for pneumonia. 2. Signs of extensive left sided mesothelioma with evidence of progression of disease. 3. Interval increase in left pre-vascular and left CP angle nodal metastasis. Similar appearance of pericardial invasion with tumor extending up to the left ventricular myocardium. 4. Right upper lobe pulmonary metastasis has increased in size in the interval. 5. Large nodal mass within the gastrohepatic ligament has significantly increased in size in the interval. 6. New bilateral adrenal gland metastasis. 7. Peritoneal carcinomatosis with omental caking is again noted. New peritoneal nodule is identified in the left upper quadrant of the abdomen. Left abdominal wall peritoneal implant has increased in size in the interval. 8. Decrease in volume of abdominopelvic ascites. 9. Multifocal liver metastasis. Index lesions are decreased in  size in the interval. 10. Aortic Atherosclerosis (ICD10-I70.0). Electronically Signed   By: Kerby Moors M.D.   On: 07/30/2021 17:17   CT ABDOMEN PELVIS W CONTRAST  Result Date: 07/22/2021 CLINICAL DATA:  Abdominal pain * Tracking Code: BO * EXAM: CT ABDOMEN AND PELVIS WITH CONTRAST TECHNIQUE: Multidetector CT imaging of the abdomen and pelvis was performed using the standard protocol following bolus administration of intravenous contrast. RADIATION DOSE REDUCTION: This exam was performed according to the departmental dose-optimization program which includes automated exposure control,  adjustment of the mA and/or kV according to patient size and/or use of iterative reconstruction technique. CONTRAST:  19mL OMNIPAQUE IOHEXOL 300 MG/ML  SOLN COMPARISON:  CT abdomen pelvis, 06/13/2021 FINDINGS: Lower chest: Please see separately reported examination of the chest. Hepatobiliary: Multiple somewhat ill-defined, hypodense liver lesions are generally diminished in size compared prior examination, lesion in the central right lobe of the liver measuring 2.1 x 1.5 cm, previously 3.9 x 3.3 cm (series 2, image 46), lesion in the anterior liver dome, hepatic segment VIII, measuring 1.1 x 1.0 cm, previously 2.0 x 1.6 cm (series 2, image 40). There is however one enlarged lesion of the inferior tip of the right lobe of the liver, hepatic segment VI, measuring 2.8 x 2.5 cm, previously 1.6 x 1.5 cm (series 2, image 66). No gallstones, gallbladder wall thickening, or biliary dilatation. Pancreas: Unremarkable. No pancreatic ductal dilatation or surrounding inflammatory changes. Spleen: Normal in size without significant abnormality. Adrenals/Urinary Tract: New metastatic nodule of the right adrenal gland measuring 2.3 x 1.9 cm (series 2, image 50). Kidneys are normal, without renal calculi, solid lesion, or hydronephrosis. Bladder is unremarkable. Stomach/Bowel: Stomach is within normal limits. Appendix not clearly visualized. No evidence of bowel wall thickening, distention, or inflammatory changes. Descending and sigmoid diverticulosis. Vascular/Lymphatic: Aortic atherosclerosis. Significant interval enlargement of a bulky gastrohepatic lymph node or celiac axis lymph node or soft tissue mass, now measuring 8.8 x 6.2 cm, previously 3.7 x 3.2 cm (series 2, image 61). New nodule of the medial limb of the left adrenal gland measuring 3.0 x 2.2 cm (series 2, image 61). Reproductive: Status post prostatectomy. Other: No abdominal wall hernia or abnormality. Interval resolution of previously seen small volume ascites.  Similar appearance of diffuse omental and peritoneal thickening. At least one new nodule or lymph node in the left upper quadrant adjacent to the spleen and splenic flexure of the colon, measuring 1.5 x 1.1 cm (series 2, image 47). Musculoskeletal: No acute or significant osseous findings. IMPRESSION: 1. Multiple somewhat ill-defined, hypodense liver lesions are generally diminished in size, however there is one enlarged lesion of the inferior tip of the right lobe of the liver. 2. Significant interval enlargement of a bulky gastrohepatic lymph node or celiac axis lymph node or soft tissue. 3. New bilateral adrenal metastases. 4. Similar appearance of diffuse omental and peritoneal thickening throughout the abdomen and pelvis. At least one new nodule or lymph node in the left upper quadrant. 5. Findings are consistent with mixed response treatment, however with overall worsened abdominal metastatic disease. 6. Interval resolution of previously seen small volume ascites. Aortic Atherosclerosis (ICD10-I70.0). Electronically Signed   By: Delanna Ahmadi M.D.   On: 08/10/2021 17:05   DG ABD ACUTE 2+V W 1V CHEST  Result Date: 08/10/2021 CLINICAL DATA:  Abdomen pain with constipation and nausea. EXAM: DG ABDOMEN ACUTE WITH 1 VIEW CHEST COMPARISON:  Chest x-ray June 13, 2021 FINDINGS: There is no evidence of dilated bowel loops or free  intraperitoneal air. Moderate bowel content is identified in the colon. No radiopaque calculi or other significant radiographic abnormality is seen. The mediastinal contour and cardiac silhouette are stable. Left central venous is unchanged. The heart size enlarged. Mild patchy opacities identified in the left lung base with left pleural effusion. IMPRESSION: No bowel obstruction or free air. Moderate bowel content identified in the colon. Mild patchy opacities identified in the left lung base with left pleural effusion, underlying pneumonia not excluded. Electronically Signed   By:  Abelardo Diesel M.D.   On: 07/20/2021 13:07        Scheduled Meds:  enoxaparin (LOVENOX) injection  40 mg Subcutaneous Q24H   ferrous sulfate  325 mg Oral Q breakfast   folic acid  1 mg Oral Daily   loratadine  10 mg Oral Daily   losartan  100 mg Oral Daily   pantoprazole  40 mg Oral Daily   psyllium  1 packet Oral TID   rosuvastatin  20 mg Oral QPM   senna  1 tablet Oral Daily   Continuous Infusions:   LOS: 0 days    Time spent: 45 minutes spent on chart review, discussion with nursing staff, consultants, updating family and interview/physical exam; more than 50% of that time was spent in counseling and/or coordination of care.    Geradine Girt, DO [facility] Available via Epic secure chat 7am-7pm After these hours, please refer to coverage provider listed on [email or web address] 08/04/2021, 11:34 AM

## 2021-08-04 NOTE — Assessment & Plan Note (Signed)
Possibly reactive to worsening metastatic disease.  No fever or other symptoms suggestive of infection.  Blood cultures are pending.

## 2021-08-04 NOTE — Progress Notes (Signed)
DIAGNOSIS: Stage I/II malignant pleural mesothelioma involving the left hemithorax diagnosed in September 2021.   PRIOR THERAPY: Systemic chemotherapy with carboplatin for AUC of 5, Alimta 500 mg/M2 and Avastin 15 mg/KG every 3 weeks.  Status post 23 cycles.  Starting from cycle #7 the patient will be on treatment with maintenance Avastin 15 mg/KG every 3 weeks.  His treatment with Avastin was discontinued secondary to suspicious GI perforation.   CURRENT THERAPY: Systemic chemotherapy again with carboplatin for AUC of 5 and Alimta 500 Mg/M2 every 3 weeks.  First dose 06/30/2021.  Status post 2 cycles.  Subjective: The patient is seen and examined today.  His girlfriend [name] was at the bedside.  He was admitted with abdominal pain that was getting worse over the last few days.  During his evaluation he had repeat CT scan of the chest, abdomen and pelvis that showed improvement in the liver lesion but there was evidence for disease progression in the abdominal lymph node as well as bilateral adrenal metastasis.  The patient continues to have the abdominal distention.  He has no current nausea or vomiting.  He has intermittent pain in the right shoulder with baseline shortness of breath increased with exertion.  Objective: Vital signs in last 24 hours: Temp:  [97.6 F (36.4 C)-97.9 F (36.6 C)] 97.6 F (36.4 C) (06/19 2209) Pulse Rate:  [109-116] 113 (06/20 0449) Resp:  [16-26] 20 (06/20 0449) BP: (91-143)/(65-93) 102/80 (06/20 0449) SpO2:  [94 %-100 %] 97 % (06/20 0449) Weight:  [181 lb 3.5 oz (82.2 kg)] 181 lb 3.5 oz (82.2 kg) (06/19 1147)  Intake/Output from previous day: 06/19 0701 - 06/20 0700 In: 193.8 [IV Piggyback:193.8] Out: -  Intake/Output this shift: No intake/output data recorded.  General appearance: alert, cooperative, fatigued, and no distress Resp: diminished breath sounds LLL and dullness to percussion LLL Cardio: regular rate and rhythm, S1, S2 normal, no murmur,  click, rub or gallop GI: abnormal findings:  ascites and distended Extremities: extremities normal, atraumatic, no cyanosis or edema  Lab Results:  Recent Labs    08/07/2021 1155 08/04/21 0500  WBC 18.2* 16.3*  HGB 11.4* 10.2*  HCT 35.8* 31.9*  PLT 129* 133*   BMET Recent Labs    07/20/2021 1155  NA 132*  K 3.9  CL 92*  CO2 25  GLUCOSE 239*  BUN 24*  CREATININE 0.97  CALCIUM 9.6    Studies/Results:   Medications: I have reviewed the patient's current medications.   Assessment/Plan: This is a very pleasant 81 years old white male diagnosed with malignant pleural mesothelioma involving the left hemothorax in September 2021 status post systemic chemotherapy initially with carboplatin, Alimta and Avastin for 6 cycles followed by maintenance treatment with Avastin for 17 more cycles.  He was on observation secondary to suspicious GI perforation from the treatment with Avastin. Unfortunately the patient developed disease progression and he started treatment again with carboplatin and Alimta status post 2 cycles.  He was tolerating the treatment well except for the increasing fatigue and weakness. Unfortunately he was admitted to the hospital with worsening abdominal pain and repeat imaging studies showed improvement in the liver lesion but there was significant disease progression with interval enlargement of bulky and gastrohepatic nodes or celiac axis lymph node/soft tissue in addition to new bilateral adrenal metastasis and similar appearance of the diffuse omental and peritoneal thickening throughout the abdomen and pelvis. I had a lengthy discussion with the patient and his girlfriend today about his current condition and  treatment options. I explained to the patient his poor prognosis and recommended for him consideration of palliative care and hospice but if the patient is not interested in this option at this point I would consider him for treatment with immunotherapy with a  combination of ipilimumab and nivolumab on outpatient basis. For pain management he will continue with the current pain medication for now. The patient and his girlfriend will discuss his options and let us know their final decision in the next few days. Thank you so much for taking good care of Mr. Bickford, I will continue to follow-up the patient with you and assist in his management on as-needed basis.  Disclaimer: This note was dictated with voice recognition software. Similar sounding words can inadvertently be transcribed and may be missed upon review. Eilleen Kempf, MD   LOS: 0 days    Eilleen Kempf 08/04/2021

## 2021-08-04 NOTE — Assessment & Plan Note (Signed)
Likely reactive secondary to malignancy.  Continue follow-up with daily labs.

## 2021-08-04 NOTE — Assessment & Plan Note (Signed)
Controlled.  Continue home losartan-HCTZ.

## 2021-08-04 NOTE — Anesthesia Procedure Notes (Signed)
Procedure Name: Intubation Date/Time: 08/04/2021 11:49 PM  Performed by: Montel Clock, CRNAPre-anesthesia Checklist: Patient identified, Emergency Drugs available, Suction available, Patient being monitored and Timeout performed Patient Re-evaluated:Patient Re-evaluated prior to induction Oxygen Delivery Method: Circle system utilized Preoxygenation: Pre-oxygenation with 100% oxygen Induction Type: IV induction Ventilation: Mask ventilation without difficulty Laryngoscope Size: Glidescope and 4 Grade View: Grade I Tube type: Oral Tube size: 7.5 mm Number of attempts: 2 Airway Equipment and Method: Rigid stylet and Video-laryngoscopy Placement Confirmation: ETT inserted through vocal cords under direct vision, positive ETCO2 and breath sounds checked- equal and bilateral Secured at: 23 (teeth) cm Tube secured with: Tape Dental Injury: Teeth and Oropharynx as per pre-operative assessment  Difficulty Due To: Difficult Airway- due to anterior larynx Comments: 1st attempt MAC 3 grade 3/4 view. 2nd attempt glidescope successful. ETCO2 positive.

## 2021-08-05 ENCOUNTER — Encounter (HOSPITAL_COMMUNITY): Payer: PPO

## 2021-08-05 ENCOUNTER — Inpatient Hospital Stay (HOSPITAL_COMMUNITY): Payer: PPO

## 2021-08-05 DIAGNOSIS — I10 Essential (primary) hypertension: Secondary | ICD-10-CM | POA: Diagnosis not present

## 2021-08-05 DIAGNOSIS — R579 Shock, unspecified: Secondary | ICD-10-CM | POA: Diagnosis not present

## 2021-08-05 DIAGNOSIS — K7200 Acute and subacute hepatic failure without coma: Secondary | ICD-10-CM

## 2021-08-05 DIAGNOSIS — N179 Acute kidney failure, unspecified: Secondary | ICD-10-CM | POA: Diagnosis not present

## 2021-08-05 DIAGNOSIS — I469 Cardiac arrest, cause unspecified: Secondary | ICD-10-CM

## 2021-08-05 DIAGNOSIS — E872 Acidosis, unspecified: Secondary | ICD-10-CM | POA: Diagnosis not present

## 2021-08-05 DIAGNOSIS — J9601 Acute respiratory failure with hypoxia: Secondary | ICD-10-CM | POA: Diagnosis not present

## 2021-08-05 DIAGNOSIS — C457 Mesothelioma of other sites: Secondary | ICD-10-CM | POA: Diagnosis not present

## 2021-08-05 LAB — COMPREHENSIVE METABOLIC PANEL
ALT: 1405 U/L — ABNORMAL HIGH (ref 0–44)
AST: 1308 U/L — ABNORMAL HIGH (ref 15–41)
Albumin: 2.7 g/dL — ABNORMAL LOW (ref 3.5–5.0)
Alkaline Phosphatase: 71 U/L (ref 38–126)
Anion gap: 18 — ABNORMAL HIGH (ref 5–15)
BUN: 47 mg/dL — ABNORMAL HIGH (ref 8–23)
CO2: 15 mmol/L — ABNORMAL LOW (ref 22–32)
Calcium: 8.9 mg/dL (ref 8.9–10.3)
Chloride: 102 mmol/L (ref 98–111)
Creatinine, Ser: 2.66 mg/dL — ABNORMAL HIGH (ref 0.61–1.24)
GFR, Estimated: 24 mL/min — ABNORMAL LOW (ref >60)
Glucose, Bld: 164 mg/dL — ABNORMAL HIGH (ref 70–99)
Potassium: 4.6 mmol/L (ref 3.5–5.1)
Sodium: 135 mmol/L (ref 135–145)
Total Bilirubin: 0.6 mg/dL (ref 0.3–1.2)
Total Protein: 5.6 g/dL — ABNORMAL LOW (ref 6.5–8.1)

## 2021-08-05 LAB — CBC
HCT: 29.7 % — ABNORMAL LOW (ref 39.0–52.0)
Hemoglobin: 9.3 g/dL — ABNORMAL LOW (ref 13.0–17.0)
MCH: 29.3 pg (ref 26.0–34.0)
MCHC: 31.3 g/dL (ref 30.0–36.0)
MCV: 93.7 fL (ref 80.0–100.0)
Platelets: 152 10*3/uL (ref 150–400)
RBC: 3.17 MIL/uL — ABNORMAL LOW (ref 4.22–5.81)
RDW: 17.9 % — ABNORMAL HIGH (ref 11.5–15.5)
WBC: 25.5 10*3/uL — ABNORMAL HIGH (ref 4.0–10.5)
nRBC: 0.2 % (ref 0.0–0.2)

## 2021-08-05 LAB — BLOOD GAS, ARTERIAL
Acid-base deficit: 10.7 mmol/L — ABNORMAL HIGH (ref 0.0–2.0)
Bicarbonate: 15 mmol/L — ABNORMAL LOW (ref 20.0–28.0)
O2 Saturation: 96.7 %
Patient temperature: 36.4
pCO2 arterial: 31 mmHg — ABNORMAL LOW (ref 32–48)
pH, Arterial: 7.29 — ABNORMAL LOW (ref 7.35–7.45)
pO2, Arterial: 94 mmHg (ref 83–108)

## 2021-08-05 LAB — GLUCOSE, CAPILLARY
Glucose-Capillary: 170 mg/dL — ABNORMAL HIGH (ref 70–99)
Glucose-Capillary: 177 mg/dL — ABNORMAL HIGH (ref 70–99)
Glucose-Capillary: 178 mg/dL — ABNORMAL HIGH (ref 70–99)
Glucose-Capillary: 189 mg/dL — ABNORMAL HIGH (ref 70–99)

## 2021-08-05 LAB — MRSA NEXT GEN BY PCR, NASAL: MRSA by PCR Next Gen: NOT DETECTED

## 2021-08-05 LAB — TROPONIN I (HIGH SENSITIVITY): Troponin I (High Sensitivity): 143 ng/L (ref <18)

## 2021-08-05 LAB — LACTIC ACID, PLASMA: Lactic Acid, Venous: 8.8 mmol/L (ref 0.5–1.9)

## 2021-08-05 MED ORDER — MIDAZOLAM HCL 2 MG/2ML IJ SOLN
1.0000 mg | INTRAMUSCULAR | Status: AC | PRN
  Administered 2021-08-05 (×3): 1 mg via INTRAVENOUS
  Filled 2021-08-05: qty 2

## 2021-08-05 MED ORDER — ACETAMINOPHEN 325 MG PO TABS: 650.0000 mg | ORAL_TABLET | Freq: Four times a day (QID) | ORAL | Status: DC | PRN

## 2021-08-05 MED ORDER — MIDAZOLAM HCL 2 MG/2ML IJ SOLN: 2.0000 mg | INTRAMUSCULAR | Status: DC | PRN

## 2021-08-05 MED ORDER — DEXTROSE 5 % IV SOLN: INTRAVENOUS | Status: DC

## 2021-08-05 MED ORDER — FENTANYL 2500MCG IN NS 250ML (10MCG/ML) PREMIX INFUSION
0.0000 ug/h | INTRAVENOUS | Status: DC
  Administered 2021-08-05: 25 ug/h via INTRAVENOUS
  Filled 2021-08-05: qty 250

## 2021-08-05 MED ORDER — LIP MEDEX EX OINT
TOPICAL_OINTMENT | CUTANEOUS | Status: DC | PRN
  Filled 2021-08-05: qty 7

## 2021-08-05 MED ORDER — MIDAZOLAM-SODIUM CHLORIDE 100-0.9 MG/100ML-% IV SOLN: 0.5000 mg/h | INTRAVENOUS | Status: DC

## 2021-08-05 MED ORDER — GLYCOPYRROLATE 0.2 MG/ML IJ SOLN
0.2000 mg | INTRAMUSCULAR | Status: DC | PRN
  Administered 2021-08-05 (×2): 0.2 mg via INTRAVENOUS
  Filled 2021-08-05 (×2): qty 1

## 2021-08-05 MED ORDER — MIDAZOLAM HCL 2 MG/2ML IJ SOLN
1.0000 mg | INTRAMUSCULAR | Status: DC | PRN
  Administered 2021-08-05 (×4): 1 mg via INTRAVENOUS
  Filled 2021-08-05 (×4): qty 2

## 2021-08-05 MED ORDER — NOREPINEPHRINE 4 MG/250ML-% IV SOLN
0.0000 ug/min | INTRAVENOUS | Status: DC
  Administered 2021-08-05: 5 ug/min via INTRAVENOUS
  Administered 2021-08-05 (×5): 20 ug/min via INTRAVENOUS
  Filled 2021-08-05 (×6): qty 250

## 2021-08-05 MED ORDER — FENTANYL 2500MCG IN NS 250ML (10MCG/ML) PREMIX INFUSION: 0.0000 ug/h | INTRAVENOUS | Status: DC

## 2021-08-05 MED ORDER — ACETAMINOPHEN 650 MG RE SUPP: 650.0000 mg | Freq: Four times a day (QID) | RECTAL | Status: DC | PRN

## 2021-08-05 MED ORDER — ENOXAPARIN SODIUM 30 MG/0.3ML IJ SOSY: 30.0000 mg | PREFILLED_SYRINGE | INTRAMUSCULAR | Status: DC

## 2021-08-05 MED ORDER — ONDANSETRON HCL 4 MG/2ML IJ SOLN
4.0000 mg | Freq: Four times a day (QID) | INTRAMUSCULAR | Status: DC | PRN
Start: 2021-08-05 — End: 2021-08-05

## 2021-08-05 MED ORDER — ONDANSETRON HCL 4 MG/2ML IJ SOLN: 4.0000 mg | Freq: Four times a day (QID) | INTRAMUSCULAR | Status: DC | PRN

## 2021-08-05 MED ORDER — MIDAZOLAM HCL 2 MG/2ML IJ SOLN
INTRAMUSCULAR | Status: AC
  Filled 2021-08-05: qty 2

## 2021-08-05 MED ORDER — NOREPINEPHRINE 4 MG/250ML-% IV SOLN
INTRAVENOUS | Status: AC
  Filled 2021-08-05: qty 250

## 2021-08-05 MED ORDER — FENTANYL CITRATE PF 50 MCG/ML IJ SOSY
25.0000 ug | PREFILLED_SYRINGE | INTRAMUSCULAR | Status: DC | PRN
  Administered 2021-08-05 (×3): 100 ug via INTRAVENOUS
  Administered 2021-08-05: 50 ug via INTRAVENOUS
  Administered 2021-08-05: 100 ug via INTRAVENOUS
  Administered 2021-08-05: 50 ug via INTRAVENOUS
  Administered 2021-08-05 (×5): 100 ug via INTRAVENOUS
  Administered 2021-08-05 (×3): 50 ug via INTRAVENOUS
  Administered 2021-08-05: 100 ug via INTRAVENOUS
  Administered 2021-08-05: 50 ug via INTRAVENOUS
  Filled 2021-08-05 (×2): qty 2

## 2021-08-05 MED ORDER — FENTANYL CITRATE PF 50 MCG/ML IJ SOSY: 25.0000 ug | PREFILLED_SYRINGE | INTRAMUSCULAR | Status: DC | PRN

## 2021-08-05 MED ORDER — ONDANSETRON 4 MG PO TBDP: 4.0000 mg | ORAL_TABLET | Freq: Four times a day (QID) | ORAL | Status: DC | PRN

## 2021-08-05 MED ORDER — MIDAZOLAM-SODIUM CHLORIDE 100-0.9 MG/100ML-% IV SOLN
INTRAVENOUS | Status: AC
  Filled 2021-08-05: qty 100

## 2021-08-05 MED ORDER — FENTANYL BOLUS VIA INFUSION: 100.0000 ug | INTRAVENOUS | Status: DC | PRN

## 2021-08-05 MED ORDER — ORAL CARE MOUTH RINSE: 15.0000 mL | OROMUCOSAL | Status: DC | PRN

## 2021-08-05 MED ORDER — ORAL CARE MOUTH RINSE
15.0000 mL | OROMUCOSAL | Status: DC
  Administered 2021-08-05 (×9): 15 mL via OROMUCOSAL

## 2021-08-05 MED ORDER — SODIUM CHLORIDE 0.9 % IV BOLUS
1000.0000 mL | Freq: Once | INTRAVENOUS | Status: AC
  Administered 2021-08-05: 1000 mL via INTRAVENOUS

## 2021-08-05 MED ORDER — GLYCOPYRROLATE 1 MG PO TABS: 1.0000 mg | ORAL_TABLET | ORAL | Status: DC | PRN

## 2021-08-05 MED ORDER — FENTANYL CITRATE PF 50 MCG/ML IJ SOSY
25.0000 ug | PREFILLED_SYRINGE | INTRAMUSCULAR | Status: DC | PRN
  Administered 2021-08-05 (×2): 25 ug via INTRAVENOUS

## 2021-08-05 MED ORDER — CHLORHEXIDINE GLUCONATE CLOTH 2 % EX PADS
6.0000 | MEDICATED_PAD | Freq: Every day | CUTANEOUS | Status: DC
  Administered 2021-08-05: 6 via TOPICAL

## 2021-08-05 MED ORDER — POLYVINYL ALCOHOL 1.4 % OP SOLN
1.0000 [drp] | Freq: Four times a day (QID) | OPHTHALMIC | Status: DC | PRN
  Filled 2021-08-05: qty 15

## 2021-08-05 MED ORDER — GLYCOPYRROLATE 0.2 MG/ML IJ SOLN: 0.2000 mg | INTRAMUSCULAR | Status: DC | PRN

## 2021-08-05 MED ORDER — PANTOPRAZOLE 2 MG/ML SUSPENSION: 40.0000 mg | Freq: Every day | ORAL | Status: DC

## 2021-08-05 MED ORDER — DIPHENHYDRAMINE HCL 50 MG/ML IJ SOLN: 25.0000 mg | INTRAMUSCULAR | Status: DC | PRN

## 2021-08-05 MED ORDER — FENTANYL CITRATE (PF) 100 MCG/2ML IJ SOLN
INTRAMUSCULAR | Status: AC
  Filled 2021-08-05: qty 2

## 2021-08-05 MED ORDER — ONDANSETRON HCL 4 MG PO TABS
4.0000 mg | ORAL_TABLET | Freq: Four times a day (QID) | ORAL | Status: DC | PRN
Start: 2021-08-05 — End: 2021-08-06

## 2021-08-05 MED ORDER — FENTANYL CITRATE PF 50 MCG/ML IJ SOSY: 50.0000 ug | PREFILLED_SYRINGE | INTRAMUSCULAR | Status: DC | PRN

## 2021-08-05 MED ORDER — MIDAZOLAM HCL 2 MG/2ML IJ SOLN: 1.0000 mg | INTRAMUSCULAR | Status: DC | PRN

## 2021-08-05 MED FILL — Medication: Qty: 1 | Status: AC

## 2021-08-06 NOTE — Progress Notes (Signed)
    OVERNIGHT PROGRESS REPORT  Notified by RN that patient has expired at 2359 Patient was DNR/comfort care  2 RN verified.  Family was available to RN.   Gershon Cull MSNA ACNPC-AG Acute Care Nurse Practitioner [place]

## 2021-08-06 NOTE — Progress Notes (Signed)
Patient was transferred via gurney to morgue by house coverage at 0200. All belongings were given to family member. Patient placement notified of removal to morgue.

## 2021-08-09 LAB — CULTURE, BLOOD (ROUTINE X 2)
Culture: NO GROWTH
Culture: NO GROWTH
Special Requests: ADEQUATE
Special Requests: ADEQUATE

## 2021-08-11 ENCOUNTER — Inpatient Hospital Stay: Payer: PPO | Admitting: Internal Medicine

## 2021-08-11 ENCOUNTER — Inpatient Hospital Stay: Payer: PPO

## 2021-08-11 ENCOUNTER — Other Ambulatory Visit: Payer: PPO

## 2021-08-15 NOTE — ED Provider Notes (Signed)
CODE BLUE  Called to patient's room on the fourth floor due to witnessed cardiac arrest.  Dr. Inda Merlin was already on scene, having just arrived.  Chest compressions were already underway and the patient was being bag-valve-mask ventilated.  I assisted Dr. Cyd Silence in running the code.  The patient was initially in a bradycardic PEA with very slurred, low voltage QRS complexes.  I ordered 1 amp of calcium chloride (epinephrine and atropine had already been given prior to my arrival) and then Dr. Cyd Silence requested an amp of sodium bicarbonate with which I concurred.  Additional epinephrine was given while the CRNA secured the airway.  The patient's rhythm suddenly converted from PEA to sinus tachycardia with palpable femoral pulses.  CPR was discontinued and the patient was transferred to the ICU.     Lark Langenfeld, Jenny Reichmann, MD 2021/08/17 [phone]

## 2021-08-15 NOTE — TOC Initial Note (Signed)
Transition of Care [facility]) - Initial/Assessment Note    Patient Details  Name: Jack Haynes MRN: [id number] Date of Birth: Dec 16, 1940  Transition of [facility]) CM/SW Contact:    Leeroy Cha, RN Phone Number: Aug 30, 2021, 7:47 AM  Clinical Narrative:                 Pt on vent.  Hospital death expected.  Toc following for needs and family support.  Expected Discharge Plan: [place] Barriers to Discharge: Continued Medical Work up   Patient Goals and CMS Choice Patient states their goals for this hospitalization and ongoing recovery are:: unable to state due to vent and condition      Expected Discharge Plan and Services Expected Discharge Plan: [place]   Discharge Planning Services: CM Consult   Living arrangements for the past 2 months: Single Family Home                                      Prior Living Arrangements/Services Living arrangements for the past 2 months: Single Family Home Lives with:: Significant Other                   Activities of Daily Living Home Assistive Devices/Equipment: Hearing aid, Eyeglasses (2 hearing aides, reading glasses) ADL Screening (condition at time of admission) Patient's cognitive ability adequate to safely complete daily activities?: Yes Is the patient deaf or have difficulty hearing?: Yes Does the patient have difficulty seeing, even when wearing glasses/contacts?: No Does the patient have difficulty concentrating, remembering, or making decisions?: No Patient able to express need for assistance with ADLs?: Yes (pt whispers when he talks) Does the patient have difficulty dressing or bathing?: No Independently performs ADLs?: No Communication: Independent Dressing (OT): Independent Grooming: Independent Feeding: Independent Bathing: Independent Is this a change from baseline?: Pre-admission baseline Toileting: Needs assistance Is this a change from baseline?: Change from  baseline, expected to last <3 days In/Out Bed: Needs assistance Is this a change from baseline?: Change from baseline, expected to last <3 days Walks in Home: Needs assistance Is this a change from baseline?: Change from baseline, expected to last <3 days Does the patient have difficulty walking or climbing stairs?: No Weakness of Legs: Both Weakness of Arms/Hands: Both  Permission Sought/Granted                  Emotional Assessment Appearance:: Appears stated age Attitude/Demeanor/Rapport: Unable to Assess Affect (typically observed): Unable to Assess Orientation: : Fluctuating Orientation (Suspected and/or reported Sundowners) Alcohol / Substance Use: Other (comment) Psych Involvement: No (comment)  Admission diagnosis:  Metastatic cancer ([place]) [C79.9] Generalized abdominal pain [R10.84] Hyperglycemia [R73.9] Mesothelioma ([place]) [C45.9] Abdominal pain [R10.9] Other specified diabetes mellitus with other specified complication, unspecified whether long term insulin use ([place]) [E13.69] Sepsis, due to unspecified organism, unspecified whether acute organ dysfunction present [facility]) [A41.9] Patient Active Problem List   Diagnosis Date Noted   Metastatic cancer ([place]) 08/04/2021   Abdominal pain 08/10/2021   Leukocytosis 07/19/2021   Thrombocytopenia ([place]) 08/04/2021   Left leg weakness 07/20/2021   Voice hoarseness 07/22/2021   Malignant mesothelioma of pleura metastatic to intrathoracic lymph node ([place]) 06/15/2021   Neuropathic pain 04/07/2021   DISH (diffuse idiopathic skeletal hyperostosis) 03/12/2021   Sinusitis 02/19/2021   Type 2 diabetes mellitus with hyperglycemia, without long-term current use of insulin ([place]) 06/11/2020   Hypertension associated with  diabetes ([place]) 02/28/2020   Port-A-Cath in place 01/09/2020   Mesothelioma of left lung ([place]) 11/23/2019   Encounter for antineoplastic chemotherapy 11/23/2019   S/P Left VATS drainage of pleural effusion, pleural  biopsy, placement of Pleur-x catheter 11/15/2019   Atherosclerotic cardiovascular disease 10/29/2019   Pleural effusion 10/29/2019   History of prostate cancer 10/29/2019   Essential hypertension 10/09/2019   Laryngitis 03/23/2017   Abnormal EKG 01/02/2015   Prostate cancer ([place]) 11/01/2011   PCP:  [name], MD Pharmacy:   [facility] - [address], [place] [address] Phone: [phone] Fax: [phone]  [id number] [place], [place] - [place] [address] [place] [address] Phone: [phone] Fax: [phone]  [id number] - [address] [place] [address] [place] [address] Phone: [phone] Fax: [phone]  [place] [facility] [place] [address] Phone: [phone] Fax: [phone]     Social Determinants of Health (SDOH) Interventions    Readmission Risk Interventions     No data to display

## 2021-08-15 NOTE — Progress Notes (Signed)
eLink Physician-Brief Progress Note Patient Name: Jack Haynes DOB: [birth year] MRN: [id number]   Date of Service  Aug 30, 2021  HPI/Events of Note  81 yr old male with mesothelioma admitted with abd pain.  CT chest and abd showed evidence of disease progression.  Now s/p PEA/brady arrest.  Evaluation underway for precipitating cause.  Seen by Oncology today overall prognosis poor and palliative/hospice path discussed.  eICU Interventions  Chart reviewed. Will consider CTPA to rule out PE depending upon results of initial tests.     Intervention Category Evaluation Type: New Patient Evaluation  Mauri Brooklyn, P 08/30/21, 1:06 AM

## 2021-08-15 NOTE — Consult Note (Signed)
NAME:  Jack Haynes, MRN:  [id number], DOB:  08/25/1940, LOS: 1 ADMISSION DATE:  07/22/2021, CONSULTATION DATE:  Aug 16, 2021  REFERRING MD:  Alyson Reedy, CHIEF COMPLAINT: Cardiac arrest  History of Present Illness:  81 year old with advanced mesothelioma metastatic to liver, peritoneum admitted with abdominal pain.  CT chest/abdomen showed signs of disease progression.  Seen by oncology and offered immunotherapy versus hospice.  Noted to be hypotensive towards evening and given a fluid bolus and then sustained PEA arrest, ROSC after 7 minutes, transferred to ICU intubated  Pertinent  Medical History  Mesothelioma -status post chemotherapy, complicated by bowel perforation February 2023, reaction to carboplatin 07/2021 -Diverticulitis, bowel perforation February 2023  Significant Hospital Events: Including procedures, antibiotic start and stop dates in addition to other pertinent events   CT chest with contrast 6/19 disease progression with increase tumor burden in the chest, extending into myocardium, new bilateral adrenal gland metastases, peritoneal mets and abdominal wall, decreased ascites  Interim History / Subjective:  Transferred to ICU intubated  Objective   Blood pressure (!) 88/60, pulse (!) 127, temperature (!) 97.5 F (36.4 C), temperature source Oral, resp. rate 18, height 5\' 7"  (1.702 m), weight 82.2 kg, SpO2 90 %.    Vent Mode: PRVC FiO2 (%):  [100 %] 100 % Set Rate:  [16 bmp] 16 bmp Vt Set:  [520 mL] 520 mL PEEP:  [5 cmH20] 5 cmH20   Intake/Output Summary (Last 24 hours) at 08/16/2021 0115 Last data filed at 2021/08/16 0059 Gross per 24 hour  Intake 1419.59 ml  Output --  Net 1419.59 ml   Filed Weights   07/20/2021 1147  Weight: 82.2 kg    Examination: General: Unresponsive, elderly man, orally intubated HENT: Mild pallor, scleral icterus, no JVD, left Port-A-Cath Lungs: Decreased breath sounds on left, clear on right, no rhonchi, acidotic  breathing Cardiovascular: S1-S2 tacky, no murmur Abdomen: Soft, distended, no fluid thrill, no hepatosplenomegaly Extremities: 1+ edema bilateral, no deformity Neuro: Unresponsive, pupils 3 mm bilaterally reactive to light, no meningeal signs GU: Foley  Labs show leukocytosis, stable anemia hyperglycemia  Chest x-ray 6/21 independently reviewed shows ET tube in position, small left effusion, left basilar atelectasis/infiltrate  Resolved Hospital Problem list     Assessment & Plan:  PEA arrest -unclear etiology, possible PE but CT chest with contrast was -2 days ago, we will obtain venous duplex -Tumor extension into myocardium noted, will obtain echocardiogram -Otherwise supportive care currently maintaining blood pressure after ROSC  Concern for anoxic encephalopathy -Use fentanyl for comfort, goal RASS 0 to -1 -Versed as needed only  Acute respiratory failure -Vent settings reviewed and adjusted  Best Practice (right click and "Reselect all SmartList Selections" daily)   Diet/type: NPO w/ meds via tube DVT prophylaxis: LMWH GI prophylaxis: PPI Lines: N/A Foley:  Yes, and it is still needed Code Status:  DNR Last date of multidisciplinary goals of care discussion [discussed with [place] and 2 sons, he had already decided he did not want any more chemotherapy, hospice had been recommended by oncology, they were agreeable to DNR and no escalation of care, would like to see if any reversible causes and if he can return to previous state otherwise would be agreeable to comfort care]  Labs   CBC: Recent Labs  Lab 08/02/2021 1155 08/04/21 0500  WBC 18.2* 16.3*  HGB 11.4* 10.2*  HCT 35.8* 31.9*  MCV 88.4 89.1  PLT 129* 133*    Basic Metabolic Panel: Recent Labs  Lab 07/30/2021  1155  NA 132*  K 3.9  CL 92*  CO2 25  GLUCOSE 239*  BUN 24*  CREATININE 0.97  CALCIUM 9.6   GFR: Estimated Creatinine Clearance: 62.3 mL/min (by C-G formula based on SCr of 0.97  mg/dL). Recent Labs  Lab 08/12/2021 1155 07/23/2021 1345 08/11/2021 1550 08/04/21 0500  WBC 18.2*  --   --  16.3*  LATICACIDVEN  --  3.3* 1.9  --     Liver Function Tests: Recent Labs  Lab 08/02/2021 1155  AST 14*  ALT 27  ALKPHOS 65  BILITOT 0.5  PROT 6.8  ALBUMIN 3.9   Recent Labs  Lab 07/23/2021 1155  LIPASE 16   No results for input(s): "AMMONIA" in the last 168 hours.  ABG    Component Value Date/Time   PHART 7.403 11/13/2019 0720   PCO2ART 40.4 11/13/2019 0720   PO2ART 82.4 (L) 11/13/2019 0720   HCO3 24.7 11/13/2019 0720   O2SAT 95.8 11/13/2019 0720     Coagulation Profile: No results for input(s): "INR", "PROTIME" in the last 168 hours.  Cardiac Enzymes: No results for input(s): "CKTOTAL", "CKMB", "CKMBINDEX", "TROPONINI" in the last 168 hours.  HbA1C: Hemoglobin A1C  Date/Time Value Ref Range Status  06/15/2021 12:50 PM 6.8 (A) 4.0 - 5.6 % Final  03/13/2020 10:50 AM 7.6 (A) 4.0 - 5.6 % Final   Hgb A1c MFr Bld  Date/Time Value Ref Range Status  10/15/2020 02:47 PM 8.4 (H) 4.6 - 6.5 % Final    Comment:    Glycemic Control Guidelines for People with Diabetes:Non Diabetic:  <6%Goal of Therapy: <7%Additional Action Suggested:  >8%   09/27/2019 01:45 PM 6.6 (H) 4.8 - 5.6 % Final    Comment:             Prediabetes: 5.7 - 6.4          Diabetes: >6.4          Glycemic control for adults with diabetes: <7.0     CBG: Recent Labs  Lab 08/06/2021 1154 08/04/21 1205 08/04/21 1756 2021-08-09 0037  GLUCAP 229* 293* 257* 178*    Review of Systems:   Unable to obtain since intubated and unresponsive  Past Medical History:  He,  has a past medical history of Arthritis, Cancer ([place]), Diverticulitis, Dyspnea, Hypertension, and Mesothelioma ([place]).   Surgical History:   Past Surgical History:  Procedure Laterality Date   APPENDECTOMY     BACK SURGERY  1998   disectomy   CHEST TUBE INSERTION Left 11/13/2019   Procedure: INSERTION PLEURAL DRAINAGE CATHETER;   Surgeon: Grace Isaac, MD;  Location: [facility];  Service: Thoracic;  Laterality: Left;   EYE SURGERY Left 1965   injury   IR Gem  05/27/2020   JOINT REPLACEMENT Left 2010   left knee replacement   KNEE SURGERY     PLEURAL BIOPSY Left 11/13/2019   Procedure: PLEURAL BIOPSY;  Surgeon: Grace Isaac, MD;  Location: [facility];  Service: Thoracic;  Laterality: Left;   PORTACATH PLACEMENT Left 11/26/2019   Procedure: INSERTION PORT-A-CATH BARD POWERPORT 9.6 FR CATHETER;  Surgeon: Grace Isaac, MD;  Location: [facility];  Service: Thoracic;  Laterality: Left;   PROSTATE SURGERY     REMOVAL OF PLEURAL DRAINAGE CATHETER Left 11/26/2019   Procedure: REMOVAL OF PLEURAL DRAINAGE CATHETER;  Surgeon: Grace Isaac, MD;  Location: [facility];  Service: Thoracic;  Laterality: Left;   [place]  PLEURODESIS Left 11/13/2019   Procedure: possible TALC PLEURADESIS;  Surgeon: Grace Isaac, MD;  Location: [facility];  Service: Thoracic;  Laterality: Left;   TOTAL KNEE ARTHROPLASTY  02/01/2012   Procedure: TOTAL KNEE ARTHROPLASTY;  Surgeon: Sydnee Cabal, MD;  Location: [facility];  Service: Orthopedics;  Laterality: Left;   VIDEO ASSISTED THORACOSCOPY Left 11/13/2019   Procedure: VIDEO ASSISTED THORACOSCOPY;  Surgeon: Grace Isaac, MD;  Location: [facility];  Service: Thoracic;  Laterality: Left;   VIDEO BRONCHOSCOPY N/A 11/13/2019   Procedure: VIDEO BRONCHOSCOPY;  Surgeon: Grace Isaac, MD;  Location: [facility];  Service: Thoracic;  Laterality: N/A;     Social History:   reports that he quit smoking about 40 years ago. His smoking use included cigarettes. He quit smokeless tobacco use about 2 years ago.  His smokeless tobacco use included chew. He reports current alcohol use of about 3.0 standard drinks of alcohol per week. He reports that he does not use drugs.   Family History:  His family history includes Alzheimer's disease in his brother and father;  Cancer in his sister; Heart disease in his brother and sister; Hypertension in his sister. There is no history of Colon cancer, Colon polyps, Esophageal cancer, Rectal cancer, or Stomach cancer.   Allergies Allergies  Allergen Reactions   Carboplatin Anaphylaxis, Nausea Only and Other (See Comments)    nonresponsive     Home Medications  Prior to Admission medications   Medication Sig Start Date End Date Taking? Authorizing Provider  acetaminophen (TYLENOL) 500 MG tablet Take 2 tablets (1,000 mg total) by mouth every 6 (six) hours as needed for mild pain or fever. Patient taking differently: Take 500 mg by mouth every 6 (six) hours as needed for mild pain or fever. 11/15/19  Yes Barrett, Erin R, PA-C  calcium carbonate (TUMS - DOSED IN MG ELEMENTAL CALCIUM) 500 MG chewable tablet Chew 1,000 mg by mouth daily as needed for indigestion or heartburn.   Yes [provider]  Cetirizine HCl (ZYRTEC ALLERGY) 10 MG CAPS Take 1 capsule by mouth daily.   Yes [provider]  dexamethasone (DECADRON) 4 MG tablet 1 tablet p.o. twice daily the day before, day of and day after chemotherapy every 3 weeks Patient taking differently: Take 4 mg by mouth daily. 06/23/21  Yes Curt Bears, MD  ferrous sulfate 325 (65 FE) MG tablet Take 325 mg by mouth daily with breakfast.   Yes [provider]  fluticasone (FLONASE) 50 MCG/ACT nasal spray Place 1 spray into both nostrils daily. 06/06/21  Yes Crain, Whitney L, PA  folic acid (FOLVITE) 1 MG tablet TAKE 1 TABLET BY MOUTH EVERY DAY Patient taking differently: Take 1 mg by mouth daily. 08/09/2021  Yes Heilingoetter, Cassandra L, PA-C  JARDIANCE 10 MG TABS tablet TAKE 1 TABLET BY MOUTH DAILY BEFORE BREAKFAST. Patient taking differently: Take 10 mg by mouth daily. 04/07/21  Yes Sagardia, Ines Bloomer, MD  lidocaine-prilocaine (EMLA) cream Apply to Port-A-Cath site 30-60-minute before treatment. Patient taking differently: Apply 1 Application  topically daily as needed. Apply to Port-A-Cath site 30-60-minute before treatment. 09/04/20  Yes Curt Bears, MD  losartan-hydrochlorothiazide (HYZAAR) 100-12.5 MG tablet TAKE 1 TABLET BY MOUTH EVERY DAY Patient taking differently: Take 1 tablet by mouth daily. 01/03/21  Yes Sagardia, Ines Bloomer, MD  metFORMIN (GLUCOPHAGE) 500 MG tablet TAKE 1 TABLET BY MOUTH 2 TIMES DAILY WITH A MEAL. Patient taking differently: Take 500 mg by mouth 2 (two) times daily with a meal.  03/11/21  Yes Sagardia, Ines Bloomer, MD  ondansetron (ZOFRAN-ODT) 4 MG disintegrating tablet TAKE 1 TABLET (4 MG TOTAL) BY MOUTH EVERY 8 (EIGHT) HOURS AS NEEDED FOR NAUSEA OR VOMITING. STARTING 3 DAYS AFTER CHEMOTHERAPY Patient taking differently: Take 4 mg by mouth every 8 (eight) hours as needed for nausea or vomiting. 06/30/21  Yes Heilingoetter, Cassandra L, PA-C  pantoprazole (PROTONIX) 40 MG tablet TAKE 1 TABLET BY MOUTH EVERY DAY Patient taking differently: Take 40 mg by mouth daily. 07/26/21  Yes Sagardia, Ines Bloomer, MD  prochlorperazine (COMPAZINE) 10 MG tablet Take 1 tablet (10 mg total) by mouth every 6 (six) hours as needed for nausea or vomiting. 06/23/21  Yes Curt Bears, MD  psyllium (METAMUCIL) 58.6 % powder Take 1 packet by mouth 3 (three) times daily.   Yes [provider]  rosuvastatin (CRESTOR) 20 MG tablet TAKE 1 TABLET BY MOUTH EVERY DAY Patient taking differently: Take 20 mg by mouth every evening. 08/01/20  Yes O'Neal, Cassie Freer, MD  senna (SENOKOT) 8.6 MG tablet Take 1 tablet by mouth daily.   Yes [provider]  simethicone (MYLICON) 409 MG chewable tablet Chew 125 mg by mouth daily as needed for flatulence.   Yes [provider]  traMADol (ULTRAM) 50 MG tablet TAKE 1 TABLET BY MOUTH EVERY 12 HOURS AS NEEDED. Patient taking differently: Take 50 mg by mouth every 12 (twelve) hours as needed for moderate pain. 07/27/21  Yes Curt Bears, MD  aspirin EC 81 MG tablet Take 81  mg by mouth daily. Swallow whole. Patient not taking: Reported on 07/21/2021    [provider]     Critical care time: [place] MD. [facility]. [place] [facility] Pager : [phone]  If no response to pager , please call [phone] until 7 pm After 7:00 pm call [place]  [phone]   August 26, 2021

## 2021-08-15 NOTE — IPAL (Signed)
  Interdisciplinary Goals of Care Family Meeting   Date carried out:: 08/13/21  Location of the meeting: Bedside  Member's involved: Physician and Family Member or next of kin  Durable Power of Attorney or Loss adjuster, chartered: son   Discussion: We discussed goals of care for Jack Haynes .  His son and girlfriend are at bedside.  They understand his poor prognosis related to his malignancy and Dr. Worthy Flank recommendation to go home with hospice.  They have seen him rapidly decline in the last few weeks.  With his poor long-term prognosis they want to focus on comfort.  No plans to escalate care further.  There is family coming to visit, but after that they would like to terminally extubate.  He may pass away before then.  Code status: Full DNR  Disposition: In-patient comfort care   Time spent for the meeting: 15 min.  Jack Haynes 2021/08/13, 7:34 AM

## 2021-08-15 NOTE — Progress Notes (Signed)
PT placed in chair from assistance with family. PT very restless from pain. Blood pressure at 2145 152/138. PRN dilaudid given. Repeat BP 88/60 MAP 69. Olena Heckle, NP made aware.  250cc bolus ordered and given. When bolus was done at 2320. IV fluids started at 61ml/hr per order and patient assisted to bed. Patient drowsy but arousable. NT and RN bladder scanned patient and found 151ml in bladder. Patient restless in bed complaining of being uncomfortable and having SHOB. This RN placed BP cuff and pulse ox probe to reassess vitals and this RN observed patients eyes rolling to back of patients head. Patient became unresponsive and CPR/code blue initiated.

## 2021-08-15 NOTE — Procedures (Signed)
Extubation Procedure Note  Patient Details:   Name: Jack Haynes DOB: Nov 15, 1940 MRN: [id number]   Airway Documentation:    Vent end date: 29-Aug-2021 Vent end time: 1621   Evaluation  O2 sats: stable throughout Complications: No apparent complications Patient did tolerate procedure well. Bilateral Breath Sounds: Diminished   No  This was a terminal extubation  Martha Clan 2021-08-29, 4:25 PM

## 2021-08-15 NOTE — Progress Notes (Addendum)
    OVERNIGHT PROGRESS REPORT                   Cardiac Arrest   CARDIOPULMONARY RESUSCITATION Initial rhythm: PEA CPR performance duration: 6 minutes Pulse returned on or about the 7th minute  Was defibrillation or cardioversion used ? NO Was external pacer placed ? NO Was patient intubated pre/post CPR ? During Was transvenous pacer placed ? NO  Medications Administered Include:      Yes/no Amiodarone N  Atropine N  Calcium       1  Epinephrine       3  Lidocaine N  Magnesium N  Norepinephrine N  Phenylephrine N  Sodium bicarbonate       1  Vasopression N   Evaluation Successfully resuscitated  YES Current rhythm - ST Current hemodynamic status - Stable without pressors >100 mmHg SBP MAP >47mmHg  Goals of Care:  Family and staff present: Staff witnessed - significant other immediately available to RN  Summary of discussion: ongoing - awaiting family (NOK) arrival.   The Clinical status was relayed to present family in detail including cardiac arrest and subsequent interventions. Updated and notified of patients medical condition.Pending arrival of immediate family.  Currently- FULL CODE.  Assessment & Plan:  Cardiac Arrest Circulatory shock Respiratory failure   PMHx:     This is an 81 year old male with medical history significant of stage II malignant pleural mesothelioma of the left hemithorax with liver and abdominal metastasis along with bilateral adrenal metastasis. History of prior prostate cancer s/p prostatectomy, hypertension, type 2 diabetes, who presented to the ER with abdominal pain over several days prior to arrival.   Situation: Patient was sitting in chair with nurse present in the room and requested to go back to bed. RN was assisting patient back to bed patient sat on the side of the bed, lost consciousness, RN initiated code and started CPR.  In-house physician was in the immediate area responded and started ACLS Please see code sheet  for progression. Patient regained pulse on or about the seventh minute of the event. No pressor was required. He is intubated. NP completed Transferred to ICU. CCM notified and evaluating patient presently. Stat portable chest x-ray ordered in progress. ABG ordered post ventilator initiation. - I/O's: alert provider if UOP < 0.5 mL/kg/hr  - Ventilator settings: PRVC, 6 mL/kg, FiO2 100%, PEEP 5 , continue ventilator support & lung protective strategies - Wean PEEP & FiO2 as tolerated, maintain SpO2 > 90% - Head of bed elevated 30 degrees, VAP protocol in place - Plateau pressures less than 30 cm H20  - Intermittent chest x-ray & ABG PRN - Daily WUA with SBT as tolerated  - Ensure adequate pulmonary hygiene  - ABG and CXR ordered-pending. - Post code EKG obtained. Transferred to ICU, CCM notified and present.(Dr. Elsworth Soho)  -Intermittent Fentanyl and Versed ordered for the immediate timeframe. Report and care will be handed to CCM. We Appreciate their service.   Gershon Cull BSN MSNA MSN ACNPC-AG Acute Care Nurse Practitioner [place]

## 2021-08-15 NOTE — Progress Notes (Signed)
PROGRESS NOTE    Jack Haynes  YOV:[id number] DOB: 07-04-1940 DOA: 08/04/2021 PCP: [name], MD    Brief Narrative:  81 year old male with a history of metastatic mesothelioma followed by oncology, diabetes, hypertension, was admitted to the hospital with abdominal discomfort.  Imaging of the abdomen indicated liver and abdominal metastases.  He was seen by oncology who recommended palliative/hospice approach.  Unfortunately, since his admission, patient developed PEA arrest and underwent CPR resuscitation.  He was intubated and placed on the ventilator.  After receiving chest compressions, epinephrine, sodium bicarbonate, he achieved ROSC.  Currently on vasopressors, continued on ventilator.  He remains hypotensive.  Labs show new acute kidney injury, elevated liver enzymes consistent with shock liver.  PCCM following.  Goals of care discussed with patient's son who agrees to DNR status and to not escalate care any further.  Family is currently waiting on other family members to arrive to see patient before they would likely transition to compassionate extubation.  Anticipate the patient will have in-hospital death.   Assessment & Plan:   Principal Problem:   Mesothelioma of left lung [place]) Active Problems:   Essential hypertension   Type 2 diabetes mellitus with hyperglycemia, without long-term current use of insulin (HCC)   Leukocytosis   Thrombocytopenia (HCC)   Left leg weakness   Voice hoarseness   Metastatic cancer (HCC)   AKI (acute kidney injury) ([place])   PEA (Pulseless electrical activity) (HCC)   Acute respiratory failure with hypoxia (HCC)   Shock liver   Lactic acidosis   Shock ([place])     DVT prophylaxis: enoxaparin (LOVENOX) injection 30 mg Start: 08/11/2021 2200  Code Status: DNR Family Communication: Discussed with son at the bedside Disposition Plan: Status is: Inpatient Remains inpatient appropriate because: Critically ill, anticipate in-hospital  death     Consultants:  PCCM  Procedures:  ETT 6/21>  Antimicrobials:      Subjective: Unresponsive on the ventilator  Objective: Vitals:   11-Aug-2021 1030 11-Aug-2021 1125 2021-08-11 1200 2021/08/11 1512  BP: (!) 75/46  (!) 70/48   Pulse:      Resp: 15  16   Temp:      TempSrc:      SpO2:  100%  94%  Weight:      Height:        Intake/Output Summary (Last 24 hours) at 08-11-21 1517 Last data filed at 08/11/2021 0514 Gross per 24 hour  Intake 2674.69 ml  Output --  Net 2674.69 ml   Filed Weights   07/17/2021 1147  Weight: 82.2 kg    Examination:  General exam: Intubated on ventilator, pupils are fixed Respiratory system: Coarse breath sounds bilaterally.  Respiratory effort normal.  He is not breathing over the vent Cardiovascular system: S1 & S2 heard, tachycardic. No JVD, murmurs, rubs, gallops or clicks. No pedal edema.     Data Reviewed: I have personally reviewed following labs and imaging studies  CBC: Recent Labs  Lab 07/23/2021 1155 08/04/21 0500 11-Aug-2021 0116  WBC 18.2* 16.3* 25.5*  HGB 11.4* 10.2* 9.3*  HCT 35.8* 31.9* 29.7*  MCV 88.4 89.1 93.7  PLT 129* 133* 741   Basic Metabolic Panel: Recent Labs  Lab 07/21/2021 1155 08-11-21 0116  NA 132* 135  K 3.9 4.6  CL 92* 102  CO2 25 15*  GLUCOSE 239* 164*  BUN 24* 47*  CREATININE 0.97 2.66*  CALCIUM 9.6 8.9   GFR: Estimated Creatinine Clearance: 22.7 mL/min (A) (by C-G formula based on  SCr of 2.66 mg/dL (H)). Liver Function Tests: Recent Labs  Lab 07/24/2021 1155 07-Aug-2021 0116  AST 14* 1,308*  ALT 27 1,405*  ALKPHOS 65 71  BILITOT 0.5 0.6  PROT 6.8 5.6*  ALBUMIN 3.9 2.7*   Recent Labs  Lab 07/21/2021 1155  LIPASE 16   No results for input(s): "AMMONIA" in the last 168 hours. Coagulation Profile: No results for input(s): "INR", "PROTIME" in the last 168 hours. Cardiac Enzymes: No results for input(s): "CKTOTAL", "CKMB", "CKMBINDEX", "TROPONINI" in the last 168 hours. BNP (last 3  results) No results for input(s): "PROBNP" in the last 8760 hours. HbA1C: No results for input(s): "HGBA1C" in the last 72 hours. CBG: Recent Labs  Lab 08/04/21 1756 Aug 07, 2021 0037 Aug 07, 2021 0426 07-Aug-2021 0749 08/07/2021 1126  GLUCAP 257* 178* 170* 189* 177*   Lipid Profile: No results for input(s): "CHOL", "HDL", "LDLCALC", "TRIG", "CHOLHDL", "LDLDIRECT" in the last 72 hours. Thyroid Function Tests: No results for input(s): "TSH", "T4TOTAL", "FREET4", "T3FREE", "THYROIDAB" in the last 72 hours. Anemia Panel: No results for input(s): "VITAMINB12", "FOLATE", "FERRITIN", "TIBC", "IRON", "RETICCTPCT" in the last 72 hours. Sepsis Labs: Recent Labs  Lab 07/20/2021 1345 08/01/2021 1550 08/07/21 0116  LATICACIDVEN 3.3* 1.9 8.8*    Recent Results (from the past 240 hour(s))  Blood culture (routine x 2)     Status: None (Preliminary result)   Collection Time: 08/04/21  2:48 PM   Specimen: BLOOD  Result Value Ref Range Status   Specimen Description   Final    BLOOD LEFT ANTECUBITAL Performed at [facility], [place] [address], [place], [place] [address]    Special Requests   Final    IN PEDIATRIC BOTTLE Blood Culture adequate volume Performed at [facility] [address] [place], [place] [address]    Culture   Final    NO GROWTH < 12 HOURS Performed at [facility] [address], [place], [place] [address]    Report Status PENDING  Incomplete  Blood culture (routine x 2)     Status: None (Preliminary result)   Collection Time: 08/04/21  2:48 PM   Specimen: BLOOD  Result Value Ref Range Status   Specimen Description   Final    BLOOD BLOOD LEFT HAND Performed at [facility] [address] [place], [place] [address]    Special Requests   Final    IN PEDIATRIC BOTTLE Blood Culture adequate volume Performed at [facility] [address], [place], [place] [address]    Culture   Final    NO  GROWTH < 12 HOURS Performed at [facility] [address], [place], [place] [address]    Report Status PENDING  Incomplete  MRSA Next Gen by PCR, Nasal     Status: None   Collection Time: 08/07/21 12:07 AM   Specimen: Nasal Mucosa; Nasal Swab  Result Value Ref Range Status   MRSA by PCR Next Gen NOT DETECTED NOT DETECTED Final    Comment: (NOTE) The GeneXpert MRSA Assay (FDA approved for NASAL specimens only), is one component of a comprehensive MRSA colonization surveillance program. It is not intended to diagnose MRSA infection nor to guide or monitor treatment for MRSA infections. Test performance is not FDA approved in patients less than 44 years old. Performed at [facility], [place] [address] [place],  [address]          Radiology Studies: DG CHEST PORT 1 VIEW  Result Date: 09/01/2021 CLINICAL DATA:  Intubation. EXAM: PORTABLE CHEST 1 VIEW COMPARISON:  Chest radiograph dated 07/18/2021 and CT dated 08/09/2021. FINDINGS: Endotracheal tube with tip approximately 2 cm above the carina. Left-sided Port-A-Cath in similar position. Enteric tube extends below the diaphragm with tip beyond the inferior margin of the image. Left lung base atelectasis or infiltrate, progressed since the prior radiograph. Probable small left pleural effusion. No pneumothorax. Stable cardiomediastinal silhouette. No acute osseous pathology. IMPRESSION: 1. Endotracheal tube with tip above the carina. 2. Left lung base atelectasis or infiltrate, progressed since the prior radiograph. Electronically Signed   By: Anner Crete M.D.   On: 09/01/2021 01:04   DG HIP UNILAT WITH PELVIS 2-3 VIEWS LEFT  Result Date: 07/29/2021 CLINICAL DATA:  Left hip pain, no known injury, initial encounter EXAM: DG HIP (WITH OR WITHOUT PELVIS) 3V LEFT COMPARISON:  None Available. FINDINGS: Postsurgical changes are noted consistent with prior prostatectomy. Pelvic ring is intact. Mild degenerative  changes of hip joints and lumbar spine are noted. No lytic or sclerotic lesion is seen. No acute fracture is noted. IMPRESSION: Chronic changes without acute abnormality. Electronically Signed   By: Inez Catalina M.D.   On: 07/28/2021 20:27   CT Chest W Contrast  Result Date: 07/16/2021 CLINICAL DATA:  History of mesothelioma. Respiratory illness. Abdominal pain. EXAM: CT CHEST, ABDOMEN, AND PELVIS WITH CONTRAST TECHNIQUE: Multidetector CT imaging of the chest, abdomen and pelvis was performed following the standard protocol during bolus administration of intravenous contrast. RADIATION DOSE REDUCTION: This exam was performed according to the departmental dose-optimization program which includes automated exposure control, adjustment of the mA and/or kV according to patient size and/or use of iterative reconstruction technique. CONTRAST:  110mL OMNIPAQUE IOHEXOL 300 MG/ML  SOLN COMPARISON:  CT angio chest 06/13/2021.  CT AP 06/13/2021. FINDINGS: CT CHEST FINDINGS Cardiovascular: Heart size appears normal. Small pericardial effusion is identified. Signs of tumor invasion of the pericardium is again identified with increased soft tissue along the left heart border extending up to the left ventricular myocardium, image 37/2. Mediastinum/Nodes: Thyroid gland, trachea and esophagus demonstrate no significant findings. No enlarged axillary or supraclavicular lymph nodes. Nodal mass within the left pre vascular region is identified measuring 5.8 x 3.0 cm, image 19/2. On the previous exam this measured 3.9 x 2.1 cm. Nodal conglomeration within the left CP angle with signs of pericardial invasion is identified. This measures 5.1 x 5.6 cm, image 37/2. On the previous exam this measured 4.8 x 3.4 cm. Lungs/Pleura: Tumor rind encasing the left lung is again noted compatible with the history of mesothelioma. This appears similar when compared with the previous exam. Pulmonary nodule within the posterior right upper lobe  measures 1.4 cm, image 52/4. Previously 1.1 cm. No signs of airspace consolidation, pleural effusion or pneumothorax. Musculoskeletal: No chest wall mass or suspicious bone lesions identified. CT ABDOMEN PELVIS FINDINGS Hepatobiliary: Multifocal liver metastases are again identified. Similar in multiplicity. Index lesion along the dome of right hepatic lobe measures 1 cm, image 41/2. Previously 2.2 cm. Index lesion within central right lobe of liver measures 3.1 x 2.4 cm, image 45/2. Previously 3.9 x 3.3 cm.1 gallbladder is normal. No signs of bile duct dilatation. Pancreas: Unremarkable. No pancreatic ductal dilatation or surrounding inflammatory changes. Spleen: Normal in size without focal abnormality. Adrenals/Urinary Tract: Bilateral adrenal metastases are new from previous exam: Right adrenal nodule measures 2 cm, image 50/2. The left adrenal metastasis measures 3.0 cm, image 61/2. Kidneys are unremarkable. No mass or  hydronephrosis identified. Bladder unremarkable. Stomach/Bowel: Stomach appears nondistended. No bowel wall thickening, inflammation, or distension. Moderate stool burden identified within the right colon scratch set there is a moderate stool burden identified within the colon. Vascular/Lymphatic: The upper abdominal vascularity remains patent. Aortic atherosclerosis without aneurysm. No conglomeration within the gastrohepatic ligament has mass effect upon the proximal stomach. This measures 7.1 x 6.6 cm, image 51/2. Previously this measured 3.2 x 3.6 cm. Reproductive: Status post prostatectomy. Other: Signs of tumor invasion of the left hemidiaphragm with evidence of peritoneal carcinomatosis identified. There is diffuse omental caking as noted previously. Scattered soft tissue nodules within the mesentery and peritoneum identified. Index nodule within the left upper quadrant peritoneum measures 1.6 cm, image 47/2. This is a new finding compared with the previous exam. Tumor implant along the  left lateral abdominal wall measures 3.3 x 2.0 cm, image 60/2. Previously 2.4 x 1.3 cm. Diffuse venous congestion is noted throughout the small bowel mesenteric fat which is favored to represent lymphatic obstruction secondary to tumor. Decrease abdominal ascites. Musculoskeletal: No acute or significant osseous findings. IMPRESSION: 1. No acute findings and no evidence for pneumonia. 2. Signs of extensive left sided mesothelioma with evidence of progression of disease. 3. Interval increase in left pre-vascular and left CP angle nodal metastasis. Similar appearance of pericardial invasion with tumor extending up to the left ventricular myocardium. 4. Right upper lobe pulmonary metastasis has increased in size in the interval. 5. Large nodal mass within the gastrohepatic ligament has significantly increased in size in the interval. 6. New bilateral adrenal gland metastasis. 7. Peritoneal carcinomatosis with omental caking is again noted. New peritoneal nodule is identified in the left upper quadrant of the abdomen. Left abdominal wall peritoneal implant has increased in size in the interval. 8. Decrease in volume of abdominopelvic ascites. 9. Multifocal liver metastasis. Index lesions are decreased in size in the interval. 10. Aortic Atherosclerosis (ICD10-I70.0). Electronically Signed   By: Kerby Moors M.D.   On: 07/26/2021 17:17   CT ABDOMEN PELVIS W CONTRAST  Result Date: 07/25/2021 CLINICAL DATA:  Abdominal pain * Tracking Code: BO * EXAM: CT ABDOMEN AND PELVIS WITH CONTRAST TECHNIQUE: Multidetector CT imaging of the abdomen and pelvis was performed using the standard protocol following bolus administration of intravenous contrast. RADIATION DOSE REDUCTION: This exam was performed according to the departmental dose-optimization program which includes automated exposure control, adjustment of the mA and/or kV according to patient size and/or use of iterative reconstruction technique. CONTRAST:  133mL  OMNIPAQUE IOHEXOL 300 MG/ML  SOLN COMPARISON:  CT abdomen pelvis, 06/13/2021 FINDINGS: Lower chest: Please see separately reported examination of the chest. Hepatobiliary: Multiple somewhat ill-defined, hypodense liver lesions are generally diminished in size compared prior examination, lesion in the central right lobe of the liver measuring 2.1 x 1.5 cm, previously 3.9 x 3.3 cm (series 2, image 46), lesion in the anterior liver dome, hepatic segment VIII, measuring 1.1 x 1.0 cm, previously 2.0 x 1.6 cm (series 2, image 40). There is however one enlarged lesion of the inferior tip of the right lobe of the liver, hepatic segment VI, measuring 2.8 x 2.5 cm, previously 1.6 x 1.5 cm (series 2, image 66). No gallstones, gallbladder wall thickening, or biliary dilatation. Pancreas: Unremarkable. No pancreatic ductal dilatation or surrounding inflammatory changes. Spleen: Normal in size without significant abnormality. Adrenals/Urinary Tract: New metastatic nodule of the right adrenal gland measuring 2.3 x 1.9 cm (series 2, image 50). Kidneys are normal, without renal calculi, solid lesion, or  hydronephrosis. Bladder is unremarkable. Stomach/Bowel: Stomach is within normal limits. Appendix not clearly visualized. No evidence of bowel wall thickening, distention, or inflammatory changes. Descending and sigmoid diverticulosis. Vascular/Lymphatic: Aortic atherosclerosis. Significant interval enlargement of a bulky gastrohepatic lymph node or celiac axis lymph node or soft tissue mass, now measuring 8.8 x 6.2 cm, previously 3.7 x 3.2 cm (series 2, image 61). New nodule of the medial limb of the left adrenal gland measuring 3.0 x 2.2 cm (series 2, image 61). Reproductive: Status post prostatectomy. Other: No abdominal wall hernia or abnormality. Interval resolution of previously seen small volume ascites. Similar appearance of diffuse omental and peritoneal thickening. At least one new nodule or lymph node in the left upper  quadrant adjacent to the spleen and splenic flexure of the colon, measuring 1.5 x 1.1 cm (series 2, image 47). Musculoskeletal: No acute or significant osseous findings. IMPRESSION: 1. Multiple somewhat ill-defined, hypodense liver lesions are generally diminished in size, however there is one enlarged lesion of the inferior tip of the right lobe of the liver. 2. Significant interval enlargement of a bulky gastrohepatic lymph node or celiac axis lymph node or soft tissue. 3. New bilateral adrenal metastases. 4. Similar appearance of diffuse omental and peritoneal thickening throughout the abdomen and pelvis. At least one new nodule or lymph node in the left upper quadrant. 5. Findings are consistent with mixed response treatment, however with overall worsened abdominal metastatic disease. 6. Interval resolution of previously seen small volume ascites. Aortic Atherosclerosis (ICD10-I70.0). Electronically Signed   By: Delanna Ahmadi M.D.   On: 08/01/2021 17:05        Scheduled Meds:  Chlorhexidine Gluconate Cloth  6 each Topical Q2200   enoxaparin (LOVENOX) injection  30 mg Subcutaneous Q24H   ferrous sulfate  325 mg Oral Q breakfast   folic acid  1 mg Oral Daily   insulin aspart  0-9 Units Subcutaneous TID WC   mouth rinse  15 mL Mouth Rinse Q2H   pantoprazole sodium  40 mg Per Tube Daily   senna  1 tablet Oral Daily   Continuous Infusions:  sodium chloride 75 mL/hr at 08/08/2021 0534   fentaNYL infusion INTRAVENOUS 25 mcg/hr (08-08-2021 0512)   midazolam     norepinephrine (LEVOPHED) Adult infusion 20 mcg/min (2021-08-08 1214)     LOS: 1 day    Time spent: 32mins    Kathie Dike, MD [facility]   If 7PM-7AM, please contact night-coverage [email or web address]  2021/08/08, 3:17 PM

## 2021-08-15 DEATH — deceased

## 2021-08-19 ENCOUNTER — Inpatient Hospital Stay: Payer: PPO

## 2021-08-19 ENCOUNTER — Other Ambulatory Visit: Payer: PPO

## 2021-08-25 ENCOUNTER — Inpatient Hospital Stay: Payer: PPO

## 2021-08-25 ENCOUNTER — Other Ambulatory Visit: Payer: PPO

## 2021-09-01 ENCOUNTER — Ambulatory Visit: Payer: PPO | Admitting: Physician Assistant

## 2021-09-01 ENCOUNTER — Encounter: Payer: PPO | Admitting: Dietician

## 2021-09-01 ENCOUNTER — Other Ambulatory Visit: Payer: PPO

## 2021-09-01 ENCOUNTER — Ambulatory Visit: Payer: PPO

## 2021-09-08 ENCOUNTER — Other Ambulatory Visit: Payer: PPO

## 2021-09-15 ENCOUNTER — Other Ambulatory Visit: Payer: PPO

## 2021-09-22 ENCOUNTER — Ambulatory Visit: Payer: PPO | Admitting: Internal Medicine

## 2021-09-22 ENCOUNTER — Ambulatory Visit: Payer: PPO

## 2021-09-22 ENCOUNTER — Other Ambulatory Visit: Payer: PPO

## 2021-10-20 ENCOUNTER — Ambulatory Visit: Payer: PPO | Admitting: Emergency Medicine
